# Patient Record
Sex: Female | Born: 1950
Health system: Southern US, Community
[De-identification: ages and names within clinical notes are randomized; demographics above are authoritative.]

## PROBLEM LIST (undated history)

## (undated) DIAGNOSIS — D649 Anemia, unspecified: Secondary | ICD-10-CM

## (undated) DIAGNOSIS — I10 Essential (primary) hypertension: Secondary | ICD-10-CM

## (undated) DIAGNOSIS — N184 Chronic kidney disease, stage 4 (severe): Secondary | ICD-10-CM

## (undated) DIAGNOSIS — F319 Bipolar disorder, unspecified: Secondary | ICD-10-CM

## (undated) DIAGNOSIS — E119 Type 2 diabetes mellitus without complications: Secondary | ICD-10-CM

## (undated) DIAGNOSIS — E785 Hyperlipidemia, unspecified: Secondary | ICD-10-CM

## (undated) DIAGNOSIS — F329 Major depressive disorder, single episode, unspecified: Secondary | ICD-10-CM

## (undated) DIAGNOSIS — R251 Tremor, unspecified: Secondary | ICD-10-CM

## (undated) DIAGNOSIS — F419 Anxiety disorder, unspecified: Secondary | ICD-10-CM

## (undated) DIAGNOSIS — F32A Depression, unspecified: Secondary | ICD-10-CM

## (undated) HISTORY — DX: Essential (primary) hypertension: I10

## (undated) HISTORY — DX: Anxiety disorder, unspecified: F41.9

## (undated) HISTORY — DX: Type 2 diabetes mellitus without complications: E11.9

## (undated) HISTORY — DX: Depression, unspecified: F32.A

## (undated) HISTORY — DX: Major depressive disorder, single episode, unspecified: F32.9

## (undated) HISTORY — DX: Anemia, unspecified: D64.9

## (undated) HISTORY — PX: VAGINAL HYSTERECTOMY: SUR661

## (undated) HISTORY — DX: Hyperlipidemia, unspecified: E78.5

## (undated) HISTORY — DX: Bipolar disorder, unspecified: F31.9

## (undated) HISTORY — DX: Tremor, unspecified: R25.1

## (undated) HISTORY — DX: Chronic kidney disease, stage 4 (severe): N18.4

---

## 1997-12-21 ENCOUNTER — Inpatient Hospital Stay (HOSPITAL_COMMUNITY): Admission: RE | Admit: 1997-12-21 | Discharge: 1997-12-23 | Payer: Self-pay | Admitting: Obstetrics & Gynecology

## 2000-01-29 ENCOUNTER — Inpatient Hospital Stay (HOSPITAL_COMMUNITY): Admission: AD | Admit: 2000-01-29 | Discharge: 2000-02-08 | Payer: Self-pay | Admitting: *Deleted

## 2000-02-08 ENCOUNTER — Encounter: Payer: Self-pay | Admitting: Emergency Medicine

## 2000-02-08 ENCOUNTER — Inpatient Hospital Stay: Admission: EM | Admit: 2000-02-08 | Discharge: 2000-02-18 | Payer: Self-pay | Admitting: Emergency Medicine

## 2000-02-13 ENCOUNTER — Encounter: Payer: Self-pay | Admitting: Pulmonary Disease

## 2000-02-19 ENCOUNTER — Other Ambulatory Visit: Admission: RE | Admit: 2000-02-19 | Discharge: 2000-02-25 | Payer: Self-pay

## 2000-04-17 ENCOUNTER — Other Ambulatory Visit (HOSPITAL_COMMUNITY): Admission: RE | Admit: 2000-04-17 | Discharge: 2000-04-29 | Payer: Self-pay | Admitting: *Deleted

## 2000-06-10 ENCOUNTER — Inpatient Hospital Stay (HOSPITAL_COMMUNITY): Admission: EM | Admit: 2000-06-10 | Discharge: 2000-06-22 | Payer: Self-pay | Admitting: *Deleted

## 2001-04-19 ENCOUNTER — Other Ambulatory Visit: Admission: RE | Admit: 2001-04-19 | Discharge: 2001-04-19 | Payer: Self-pay | Admitting: Obstetrics and Gynecology

## 2001-11-20 ENCOUNTER — Inpatient Hospital Stay (HOSPITAL_COMMUNITY): Admission: EM | Admit: 2001-11-20 | Discharge: 2001-12-02 | Payer: Self-pay | Admitting: Emergency Medicine

## 2001-11-20 ENCOUNTER — Encounter: Payer: Self-pay | Admitting: Emergency Medicine

## 2001-11-21 ENCOUNTER — Encounter: Payer: Self-pay | Admitting: Neurology

## 2001-11-21 ENCOUNTER — Encounter: Payer: Self-pay | Admitting: Anesthesiology

## 2001-11-24 ENCOUNTER — Encounter: Payer: Self-pay | Admitting: Neurology

## 2001-11-29 ENCOUNTER — Encounter: Payer: Self-pay | Admitting: Pulmonary Disease

## 2002-04-28 ENCOUNTER — Other Ambulatory Visit: Admission: RE | Admit: 2002-04-28 | Discharge: 2002-04-28 | Payer: Self-pay | Admitting: Obstetrics and Gynecology

## 2003-05-01 ENCOUNTER — Encounter: Admission: RE | Admit: 2003-05-01 | Discharge: 2003-07-30 | Payer: Self-pay | Admitting: Pulmonary Disease

## 2003-05-09 ENCOUNTER — Other Ambulatory Visit: Admission: RE | Admit: 2003-05-09 | Discharge: 2003-05-09 | Payer: Self-pay | Admitting: Obstetrics and Gynecology

## 2003-08-03 ENCOUNTER — Encounter: Admission: RE | Admit: 2003-08-03 | Discharge: 2003-11-01 | Payer: Self-pay | Admitting: Pulmonary Disease

## 2004-04-05 ENCOUNTER — Other Ambulatory Visit: Admission: RE | Admit: 2004-04-05 | Discharge: 2004-04-05 | Payer: Self-pay | Admitting: Obstetrics and Gynecology

## 2005-05-20 ENCOUNTER — Other Ambulatory Visit: Admission: RE | Admit: 2005-05-20 | Discharge: 2005-05-20 | Payer: Self-pay | Admitting: Obstetrics and Gynecology

## 2006-06-02 ENCOUNTER — Other Ambulatory Visit: Admission: RE | Admit: 2006-06-02 | Discharge: 2006-06-02 | Payer: Self-pay | Admitting: Obstetrics and Gynecology

## 2006-08-16 ENCOUNTER — Ambulatory Visit (HOSPITAL_BASED_OUTPATIENT_CLINIC_OR_DEPARTMENT_OTHER): Admission: RE | Admit: 2006-08-16 | Discharge: 2006-08-16 | Payer: Self-pay | Admitting: Pulmonary Disease

## 2006-08-23 ENCOUNTER — Ambulatory Visit: Payer: Self-pay | Admitting: Internal Medicine

## 2009-01-30 ENCOUNTER — Ambulatory Visit (HOSPITAL_COMMUNITY): Admission: RE | Admit: 2009-01-30 | Discharge: 2009-01-30 | Payer: Self-pay | Admitting: Pulmonary Disease

## 2009-01-30 IMAGING — CR DG SINUSES COMPLETE 3+V
4 series · 4 of 4 positions shown · non-contrast
Comparison: None available.

CLINICAL DATA: 57-year-old female with sinus headache and
congestion on the left side.

PARANASAL SINUSES - 1-2 VIEW

[[person_name]]
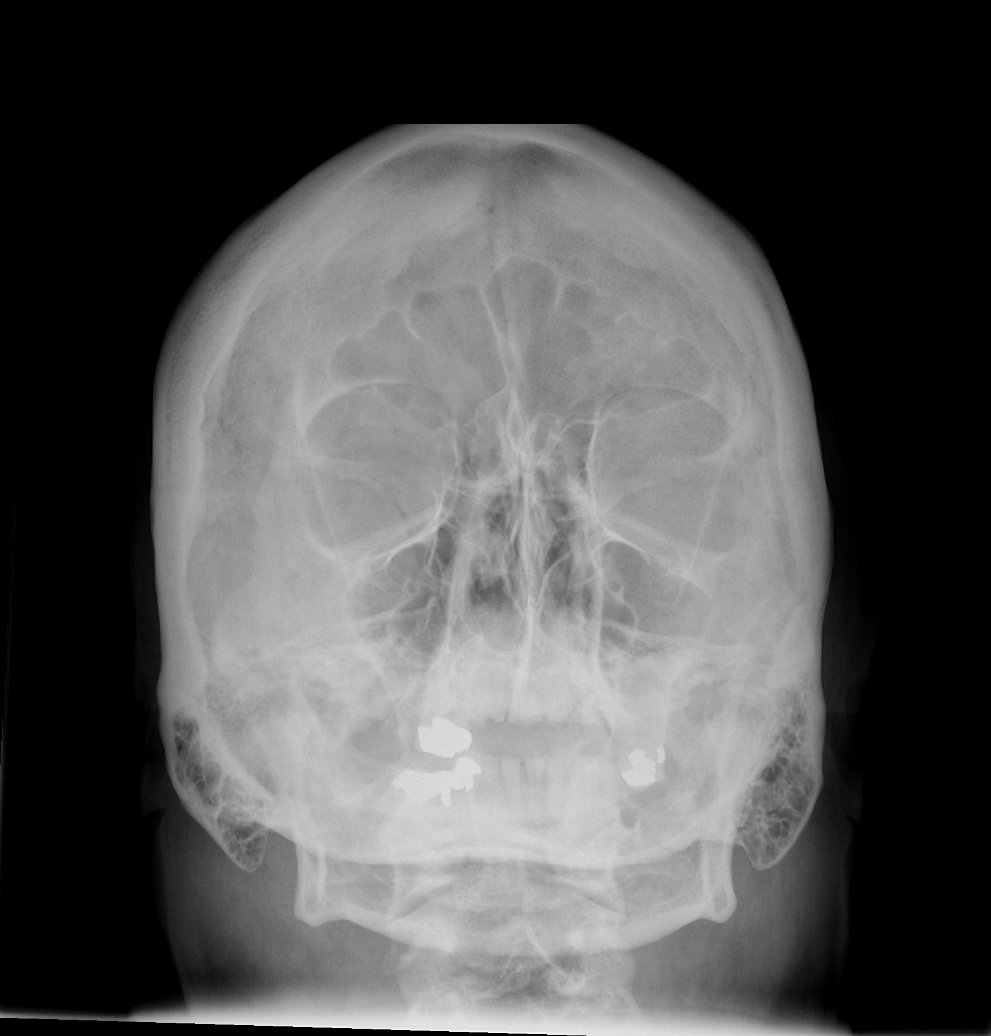

[w waters]
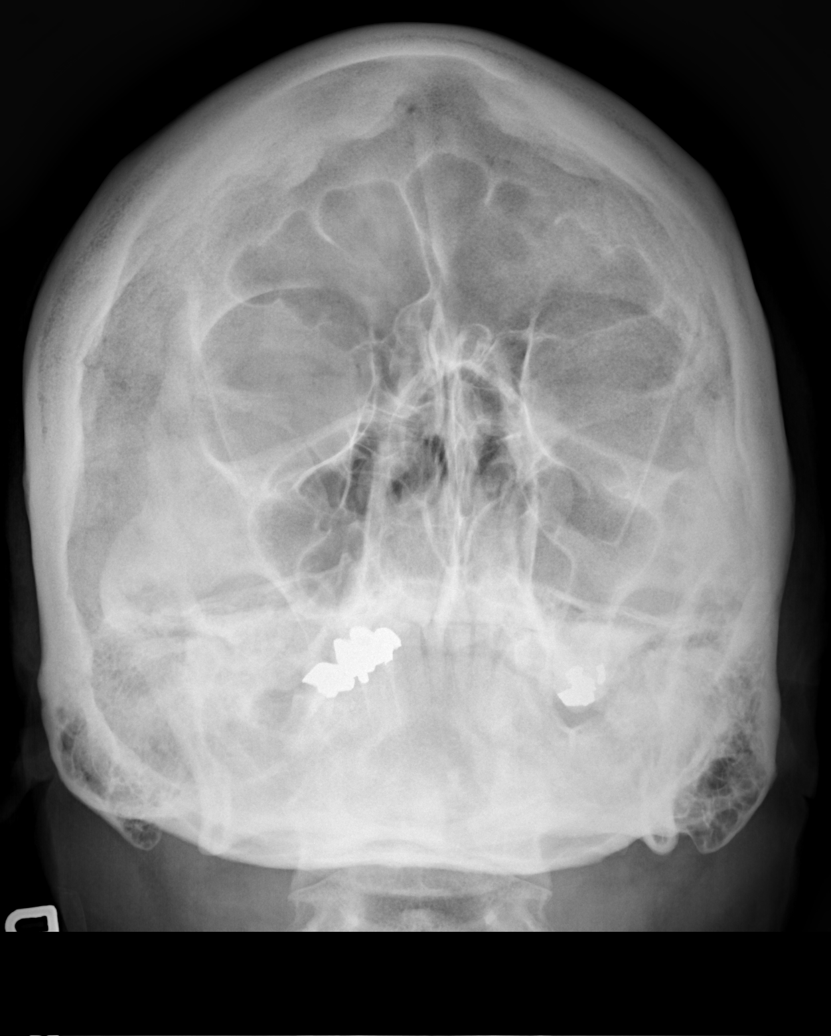

[w skull lat]
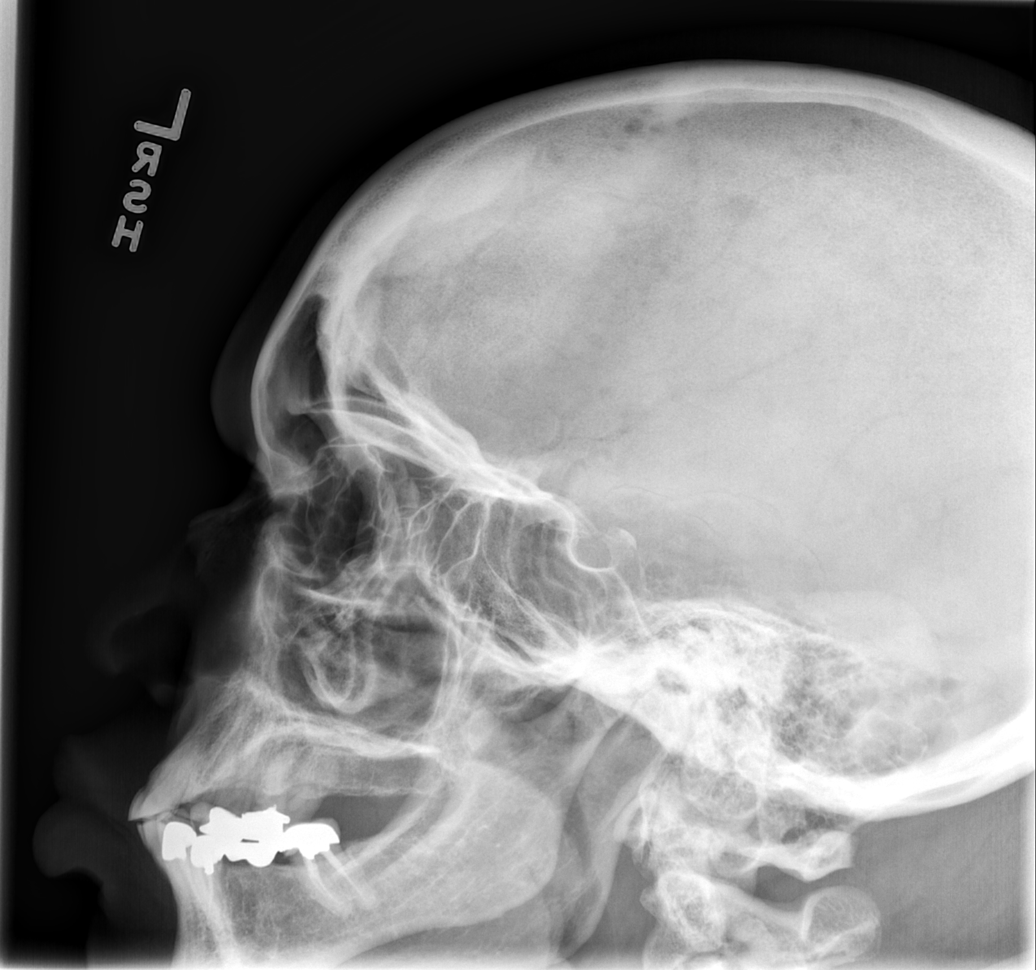

[w smv *]
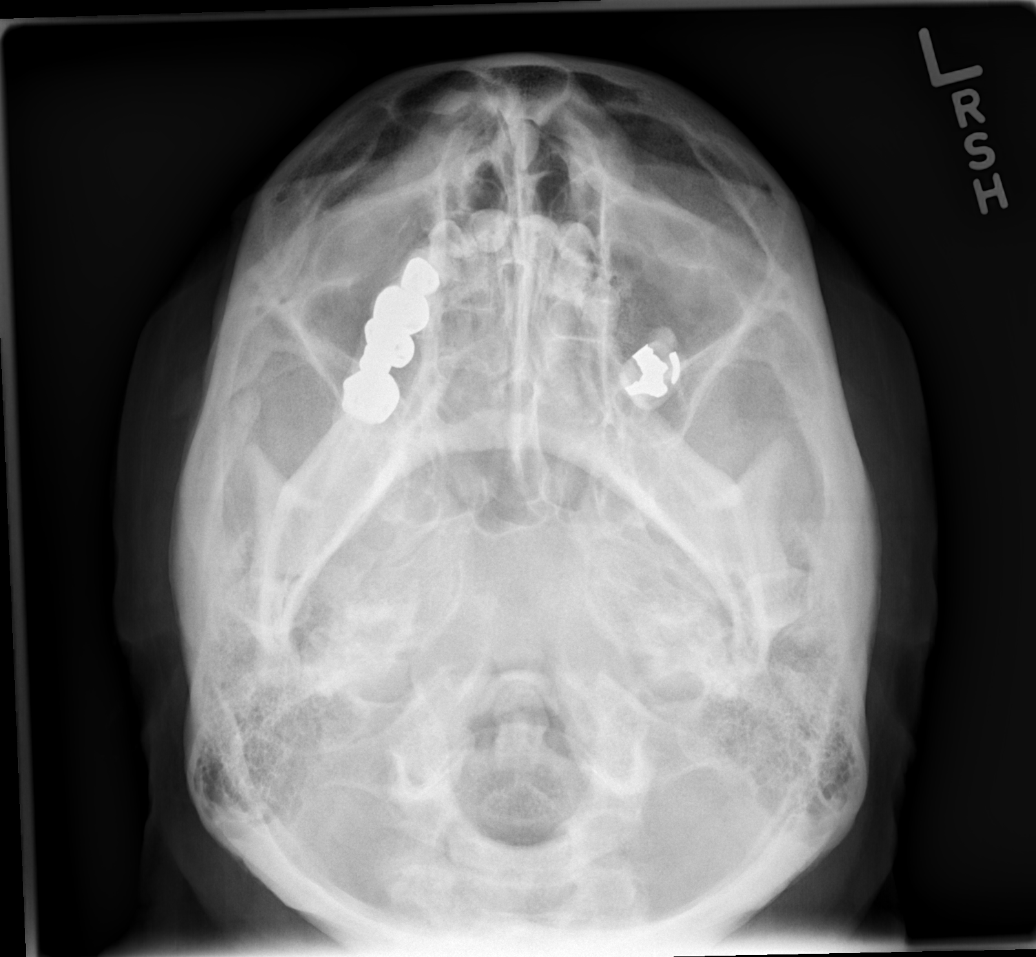

[4 of 4 positions shown; findings below may reference images not displayed]

FINDINGS: Bone mineralization is within normal limits.  Incidental
hyperostosis frontalis.  Wound with paranasal sinuses appear
normally pneumatized throughout.  Mastoid air cells also appear
normally pneumatized.  No suspicious osseous lesion identified.
IMPRESSION: No evidence of paranasal sinus disease.

## 2009-01-30 IMAGING — CR DG LUMBAR SPINE COMPLETE 4+V
5 series · 5 of 5 positions shown · non-contrast
Comparison: None.

CLINICAL DATA: Chronic low back pain.

LUMBAR SPINE - COMPLETE 4+ VIEW

[t l-spine a.p.]
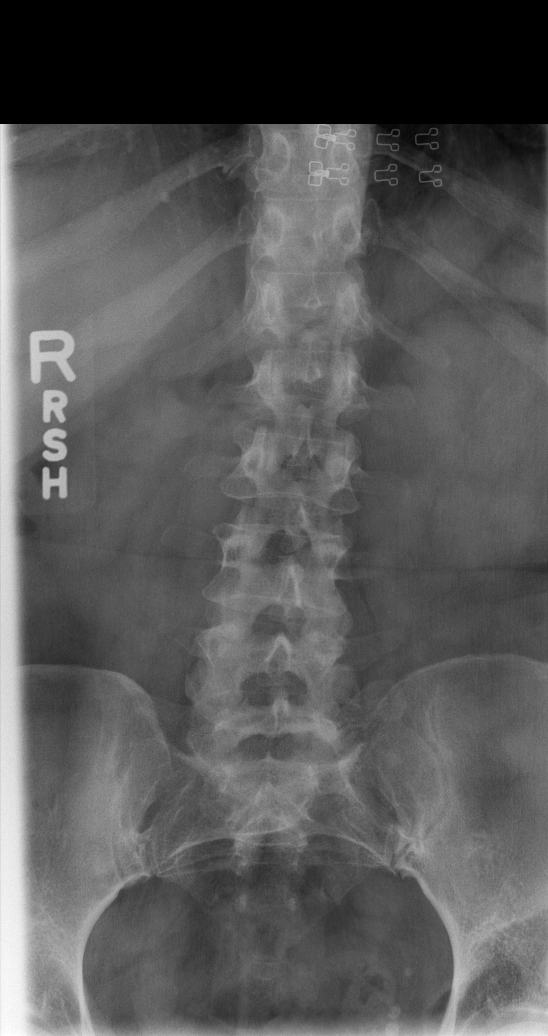

[t l-spine oblique exposure (1 of 2)]
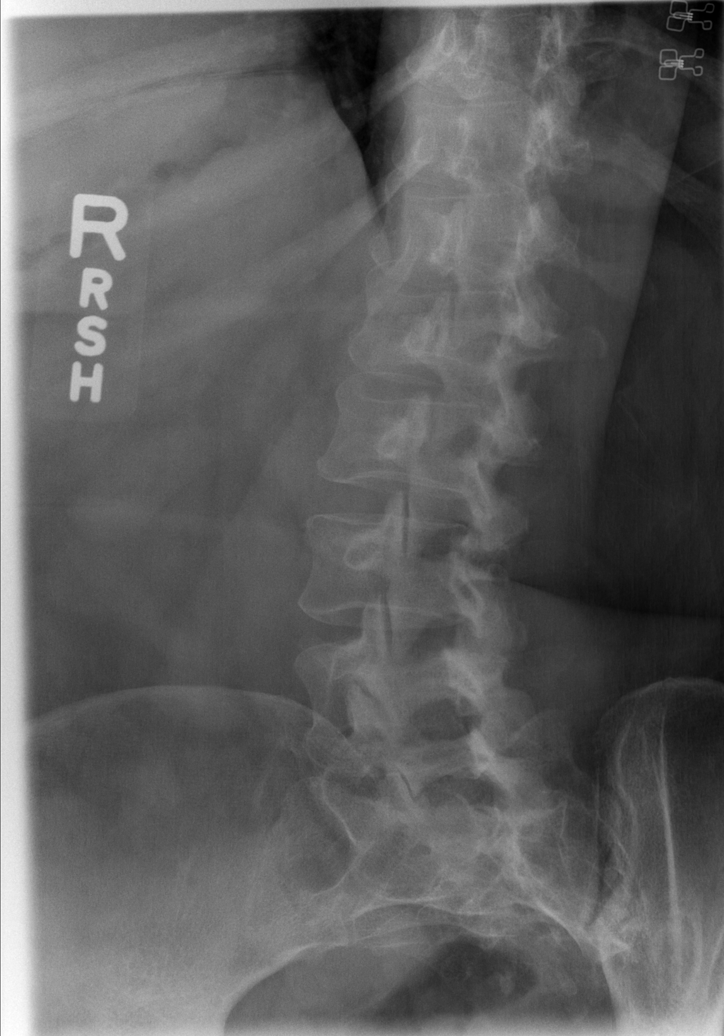

[t l-spine oblique exposure (2 of 2)]
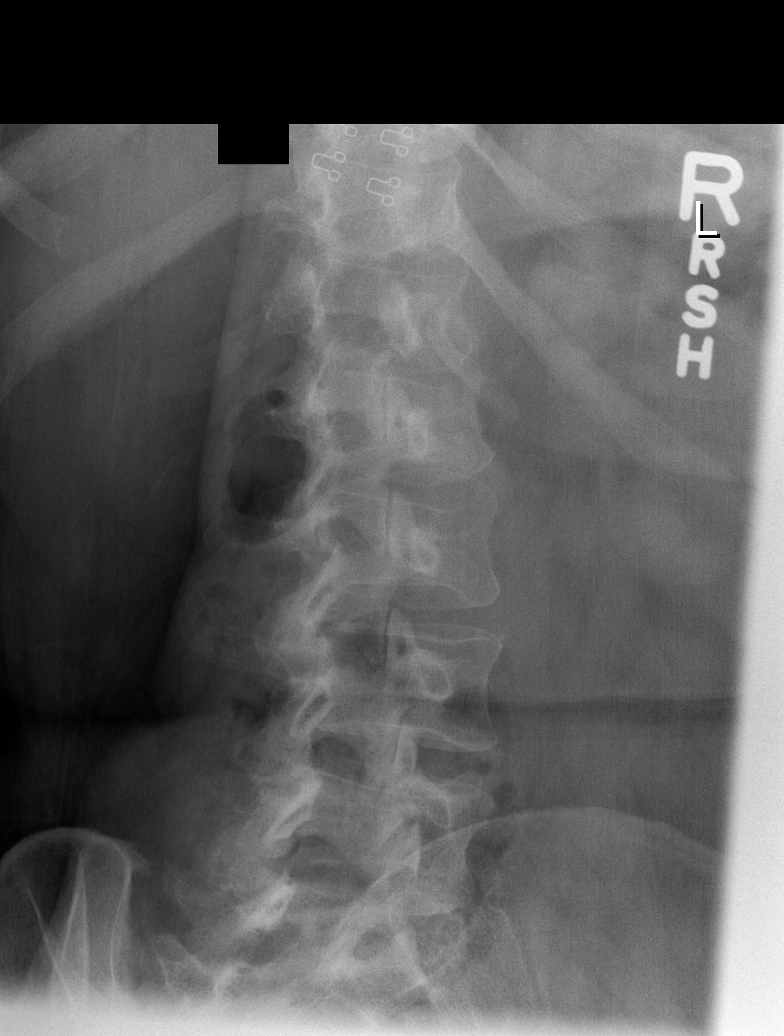

[t l-spine lat]
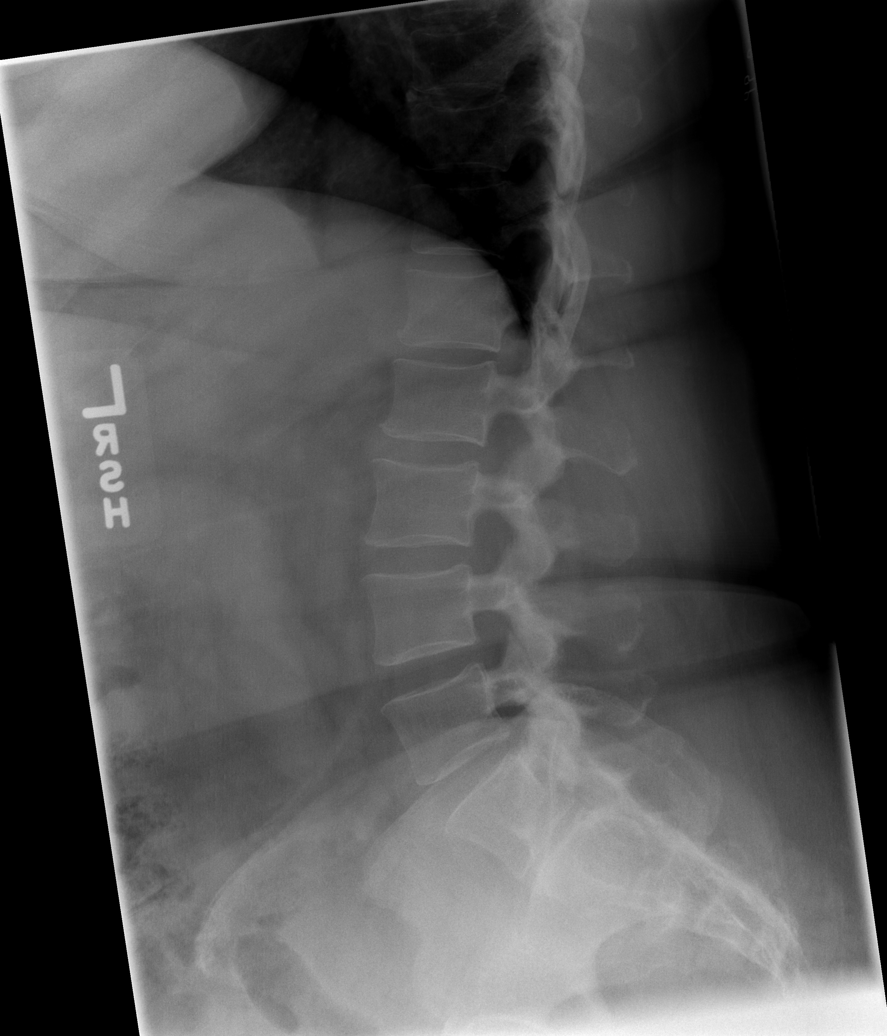

[t l-spine l5-s1 spot]
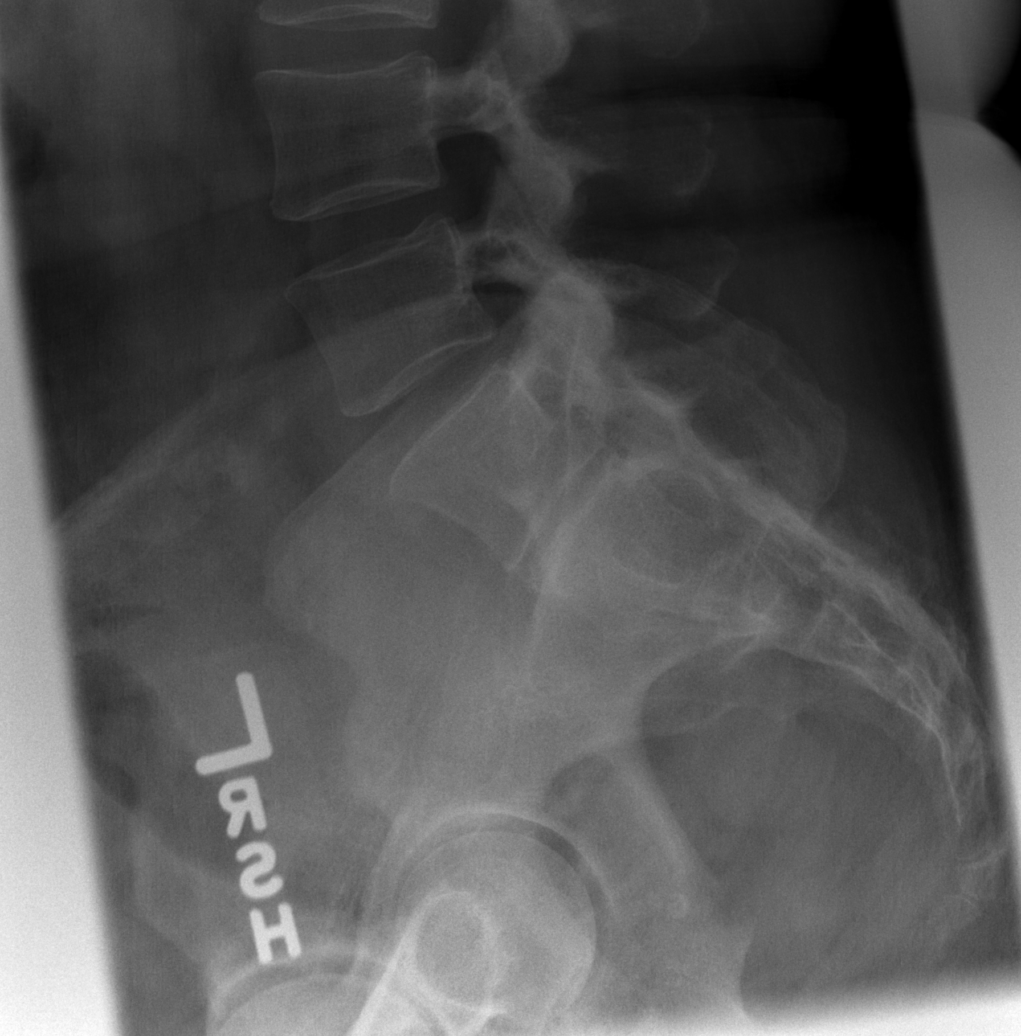

[5 of 5 positions shown; findings below may reference images not displayed]

FINDINGS: There is mild lumbar scoliosis.  L5 is a transitional
segment and partially incorporated into the sacrum.  Negative for
fracture or pars defect.  No significant degenerative change.  The
disc spaces are maintained and  the facet joints are normal.
IMPRESSION: No acute bony abnormality.  Mild scoliosis.

## 2010-08-23 ENCOUNTER — Inpatient Hospital Stay (HOSPITAL_COMMUNITY)
Admission: EM | Admit: 2010-08-23 | Discharge: 2010-08-28 | DRG: 683 | Disposition: A | Discharge status: Other Acute Inpatient Hospital | Payer: MEDICARE | Attending: Internal Medicine | Admitting: Internal Medicine

## 2010-08-23 ENCOUNTER — Emergency Department (HOSPITAL_COMMUNITY): Payer: MEDICARE

## 2010-08-23 DIAGNOSIS — R7401 Elevation of levels of liver transaminase levels: Secondary | ICD-10-CM | POA: Diagnosis present

## 2010-08-23 DIAGNOSIS — Z9119 Patient's noncompliance with other medical treatment and regimen: Secondary | ICD-10-CM

## 2010-08-23 DIAGNOSIS — N179 Acute kidney failure, unspecified: Principal | ICD-10-CM | POA: Diagnosis present

## 2010-08-23 DIAGNOSIS — E785 Hyperlipidemia, unspecified: Secondary | ICD-10-CM | POA: Diagnosis present

## 2010-08-23 DIAGNOSIS — N189 Chronic kidney disease, unspecified: Secondary | ICD-10-CM | POA: Diagnosis present

## 2010-08-23 DIAGNOSIS — F309 Manic episode, unspecified: Secondary | ICD-10-CM | POA: Diagnosis present

## 2010-08-23 DIAGNOSIS — E669 Obesity, unspecified: Secondary | ICD-10-CM | POA: Diagnosis present

## 2010-08-23 DIAGNOSIS — I129 Hypertensive chronic kidney disease with stage 1 through stage 4 chronic kidney disease, or unspecified chronic kidney disease: Secondary | ICD-10-CM | POA: Diagnosis present

## 2010-08-23 DIAGNOSIS — R7402 Elevation of levels of lactic acid dehydrogenase (LDH): Secondary | ICD-10-CM | POA: Diagnosis present

## 2010-08-23 DIAGNOSIS — Z91199 Patient's noncompliance with other medical treatment and regimen due to unspecified reason: Secondary | ICD-10-CM

## 2010-08-23 DIAGNOSIS — J029 Acute pharyngitis, unspecified: Secondary | ICD-10-CM | POA: Diagnosis present

## 2010-08-23 LAB — URINALYSIS, ROUTINE W REFLEX MICROSCOPIC
Bilirubin Urine: NEGATIVE
Specific Gravity, Urine: 1.017 (ref 1.005–1.030)
pH: 5 (ref 5.0–8.0)

## 2010-08-23 LAB — URINE MICROSCOPIC-ADD ON

## 2010-08-23 LAB — RAPID URINE DRUG SCREEN, HOSP PERFORMED
Benzodiazepines: NOT DETECTED
Cocaine: NOT DETECTED
Tetrahydrocannabinol: NOT DETECTED

## 2010-08-23 LAB — GLUCOSE, CAPILLARY: Glucose-Capillary: 65 mg/dL — ABNORMAL LOW (ref 70–99)

## 2010-08-23 LAB — CBC
HCT: 31.8 % — ABNORMAL LOW (ref 36.0–46.0)
MCV: 87.1 fL (ref 78.0–100.0)
RDW: 12.8 % (ref 11.5–15.5)
WBC: 9.6 10*3/uL (ref 4.0–10.5)

## 2010-08-23 LAB — COMPREHENSIVE METABOLIC PANEL
ALT: 104 U/L — ABNORMAL HIGH (ref 0–35)
AST: 266 U/L — ABNORMAL HIGH (ref 0–37)
Calcium: 9.7 mg/dL (ref 8.4–10.5)
GFR calc Af Amer: 8 mL/min — ABNORMAL LOW (ref >60)
Sodium: 141 mEq/L (ref 135–145)
Total Protein: 7.8 g/dL (ref 6.0–8.3)

## 2010-08-23 LAB — DIFFERENTIAL
Eosinophils Relative: 2 % (ref 0–5)
Lymphocytes Relative: 20 % (ref 12–46)
Lymphs Abs: 1.9 10*3/uL (ref 0.7–4.0)

## 2010-08-23 IMAGING — US US RENAL
1 series · 14 of 25 positions shown · non-contrast
Comparison: None

CLINICAL DATA: Acute renal failure.  Evaluate for renal
obstruction.

RENAL/URINARY TRACT ULTRASOUND COMPLETE

[Series 1: us renal · 0.28mm/px · 14 of 36 slices shown]
[im 1/36]
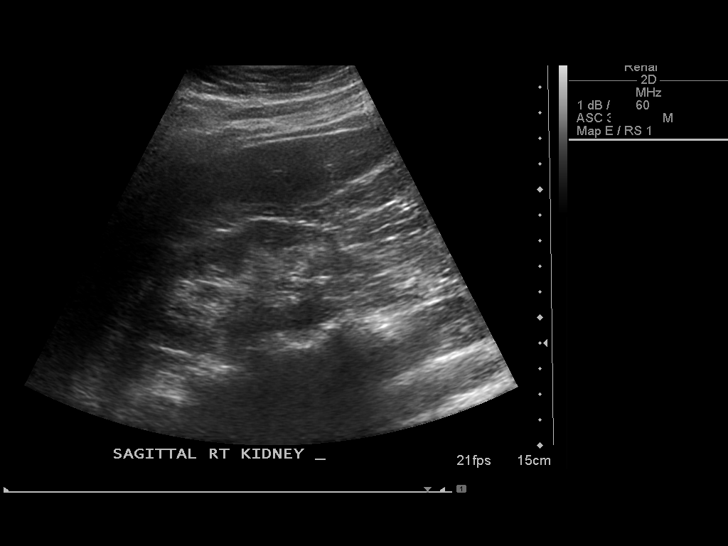
[im 3/36]
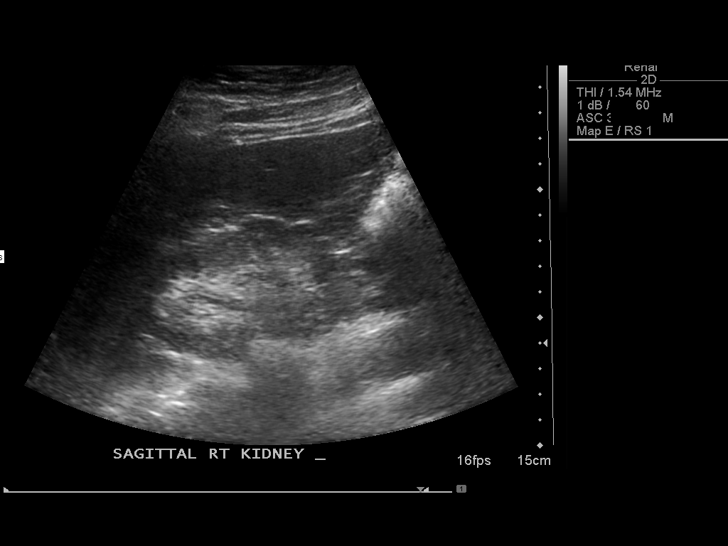
[im 6/36]
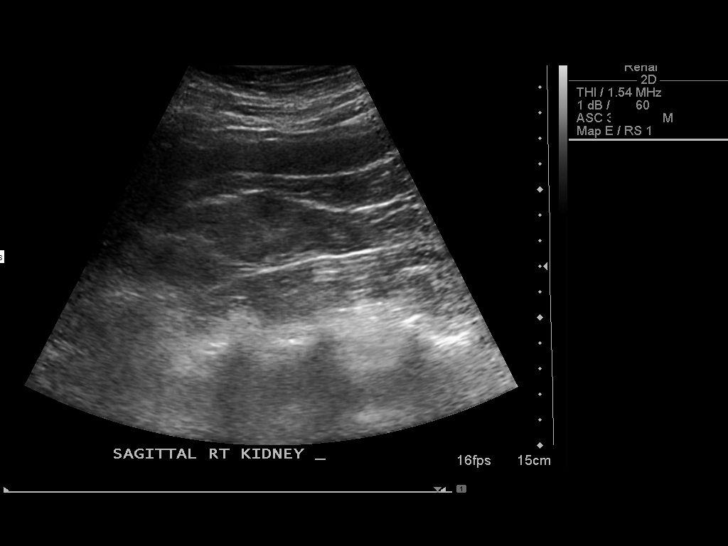
[im 9/36]
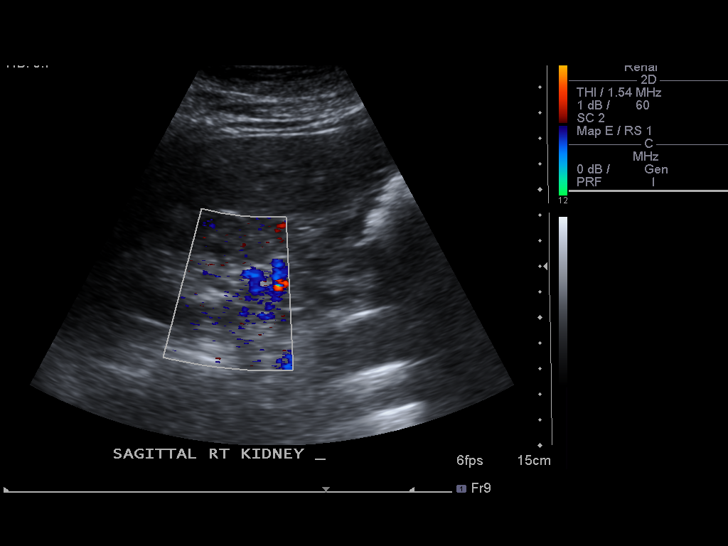
[im 12/36]
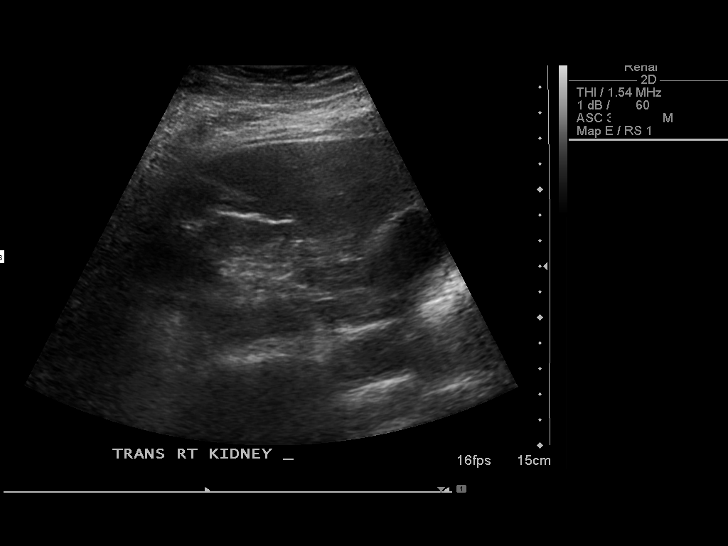
[im 14/36]
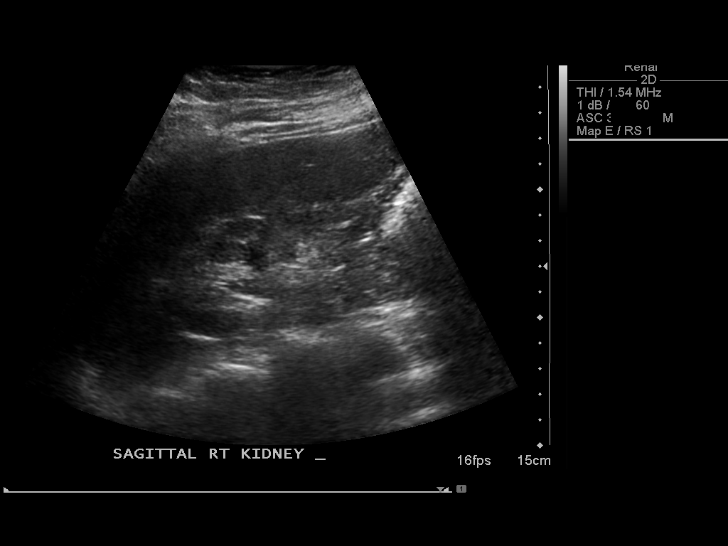
[im 17/36]
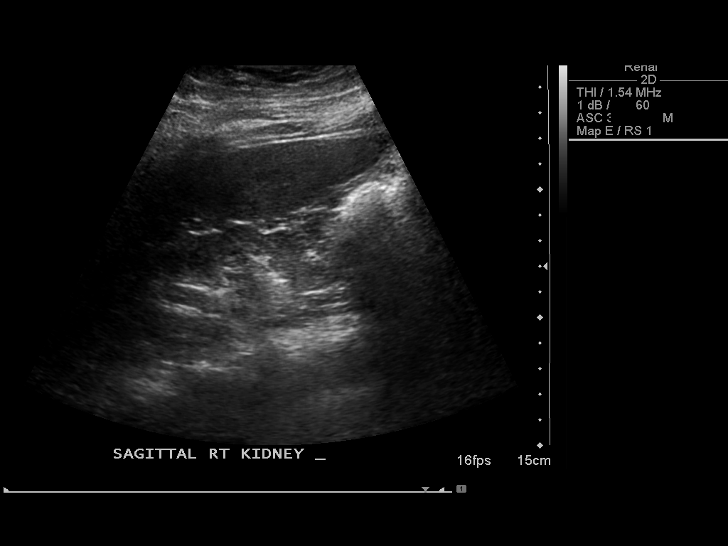
[im 19/36]
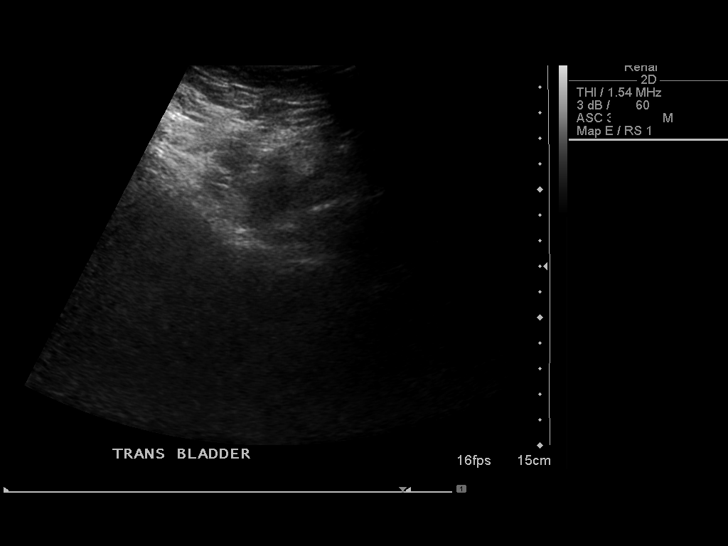
[im 22/36]
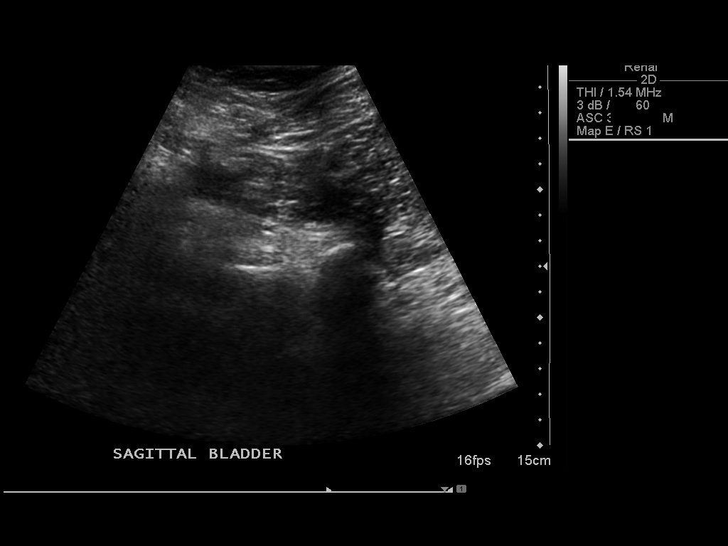
[im 24/36]
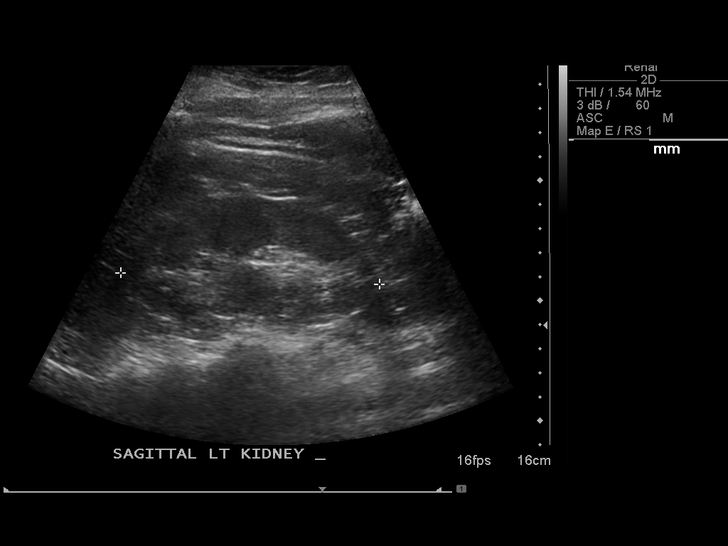
[im 27/36]
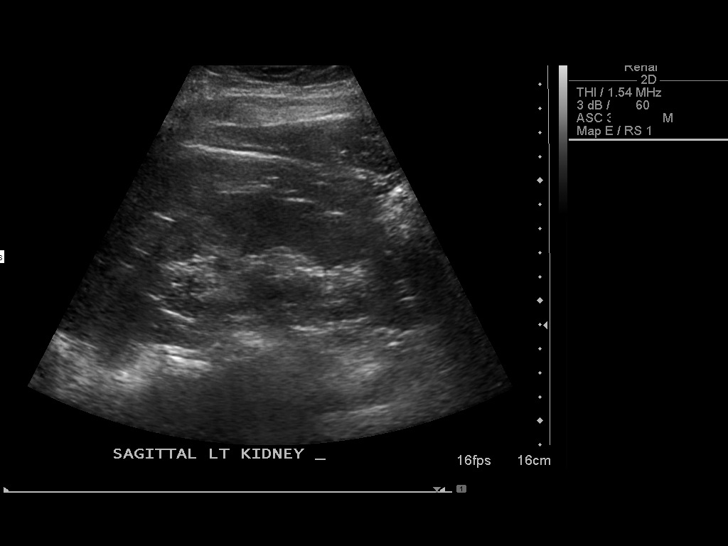
[im 30/36]
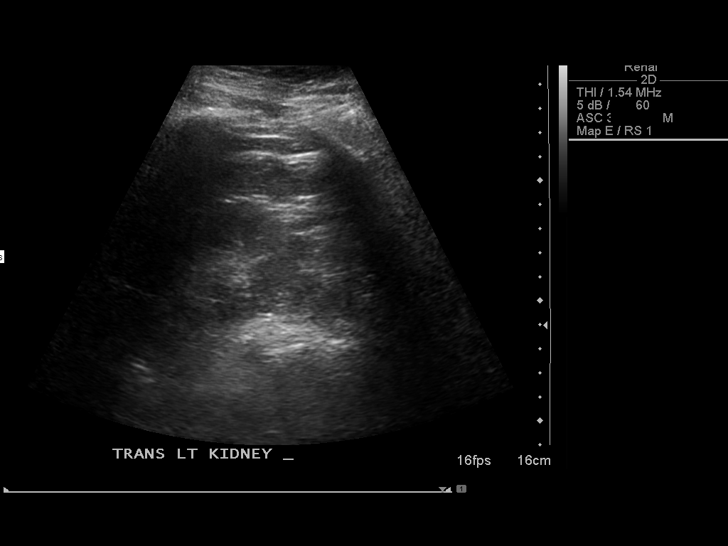
[im 33/36]
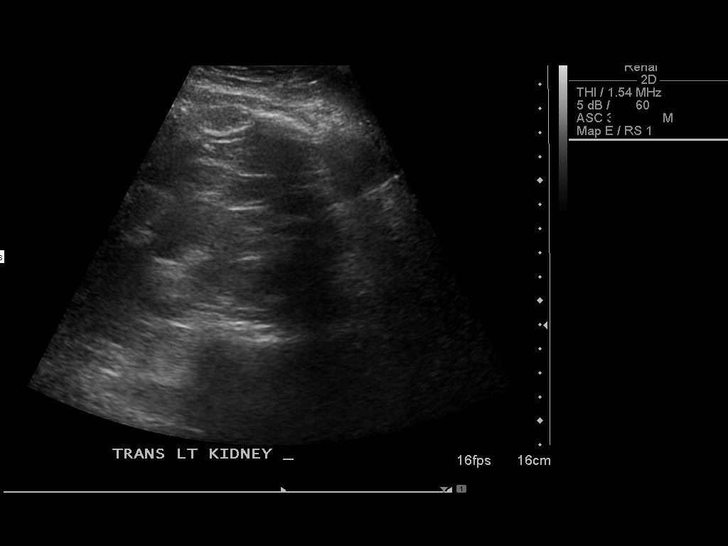
[im 36/36]
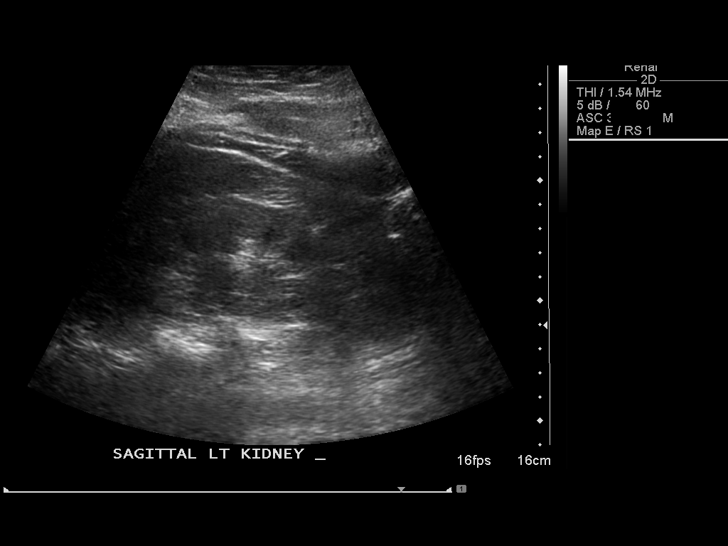

[14 of 25 positions shown; findings below may reference images not displayed]

FINDINGS: Right Kidney:  The right kidney appears small and echogenic
measuring 8.4 cm.  Right-sided  pelvocaliectasis is noted.  No mass
identified.

 Left Kidney:  The left kidney measures 10.8 cm.  There is normal
renal cortical echogenicity.  No hydronephrosis identified.  No
renal calculi noted.  No mass.

Bladder:  Appears normal for degree of bladder distention.
IMPRESSION: 1.  Right renal atrophy and pelvocaliectasis.
2.  Normal appearance of the left kidney.

## 2010-08-24 DIAGNOSIS — F312 Bipolar disorder, current episode manic severe with psychotic features: Secondary | ICD-10-CM

## 2010-08-24 LAB — BASIC METABOLIC PANEL
BUN: 85 mg/dL — ABNORMAL HIGH (ref 6–23)
CO2: 18 mEq/L — ABNORMAL LOW (ref 19–32)
Calcium: 8.9 mg/dL (ref 8.4–10.5)
Chloride: 107 mEq/L (ref 96–112)
Creatinine, Ser: 5.04 mg/dL — ABNORMAL HIGH (ref 0.4–1.2)
GFR calc Af Amer: 11 mL/min — ABNORMAL LOW (ref >60)
GFR calc non Af Amer: 9 mL/min — ABNORMAL LOW (ref >60)
Glucose, Bld: 83 mg/dL (ref 70–99)
Glucose, Bld: 93 mg/dL (ref 70–99)
Potassium: 5.5 mEq/L — ABNORMAL HIGH (ref 3.5–5.1)
Sodium: 140 mEq/L (ref 135–145)
Sodium: 136 mEq/L (ref 135–145)

## 2010-08-24 LAB — CBC
MCHC: 32.4 g/dL (ref 30.0–36.0)
MCV: 87.6 fL (ref 78.0–100.0)
Platelets: ADEQUATE 10*3/uL (ref 150–400)
RDW: 12.8 % (ref 11.5–15.5)
WBC: 6.1 10*3/uL (ref 4.0–10.5)

## 2010-08-24 LAB — LIPID PANEL
Cholesterol: 152 mg/dL (ref 0–200)
HDL: 56 mg/dL (ref >39)
Total CHOL/HDL Ratio: 2.7 RATIO
Triglycerides: 60 mg/dL (ref <150)

## 2010-08-24 LAB — GLUCOSE, CAPILLARY: Glucose-Capillary: 88 mg/dL (ref 70–99)

## 2010-08-25 DIAGNOSIS — F312 Bipolar disorder, current episode manic severe with psychotic features: Secondary | ICD-10-CM

## 2010-08-25 LAB — COMPREHENSIVE METABOLIC PANEL
ALT: 72 U/L — ABNORMAL HIGH (ref 0–35)
Alkaline Phosphatase: 39 U/L (ref 39–117)
BUN: 63 mg/dL — ABNORMAL HIGH (ref 6–23)
Chloride: 114 mEq/L — ABNORMAL HIGH (ref 96–112)
Glucose, Bld: 93 mg/dL (ref 70–99)
Potassium: 4.8 mEq/L (ref 3.5–5.1)
Sodium: 141 mEq/L (ref 135–145)
Total Bilirubin: 0.7 mg/dL (ref 0.3–1.2)

## 2010-08-25 LAB — PROTEIN / CREATININE RATIO, URINE
Protein Creatinine Ratio: 0.44 — ABNORMAL HIGH (ref 0.00–0.15)
Total Protein, Urine: 14 mg/dL

## 2010-08-25 LAB — CBC
HCT: 29.3 % — ABNORMAL LOW (ref 36.0–46.0)
Hemoglobin: 9.4 g/dL — ABNORMAL LOW (ref 12.0–15.0)
MCV: 87.2 fL (ref 78.0–100.0)
RBC: 3.36 MIL/uL — ABNORMAL LOW (ref 3.87–5.11)
WBC: 8.2 10*3/uL (ref 4.0–10.5)

## 2010-08-25 LAB — GLUCOSE, CAPILLARY
Glucose-Capillary: 99 mg/dL (ref 70–99)
Glucose-Capillary: 94 mg/dL (ref 70–99)
Glucose-Capillary: 99 mg/dL (ref 70–99)
Glucose-Capillary: 87 mg/dL (ref 70–99)

## 2010-08-25 NOTE — Consult Note (Signed)
  NAME:  Kimberly Gross, Kimberly Gross               ACCOUNT NO.:  [id number]  MEDICAL RECORD NO.:  [id number]           PATIENT TYPE:  I  LOCATION:  [id number]                         FACILITY:  [facility]  PHYSICIAN:  Marlou Sa, MD DATE OF BIRTH:  1950/08/23  DATE OF CONSULTATION:  08/24/2010 DATE OF DISCHARGE:                                CONSULTATION   REASON FOR CONSULTATION:  Acute manic episode.  HISTORY OF PRESENT ILLNESS:  A 60 year old black female with a history of bipolar disorder/schizophrenia who was admitted.  When I saw the patient, the patient is very disorganized, grandiose, talking about a card.  Wants to be with her mother who has died 3 years ago. Noncompliant with her medication for 2 weeks.  As per family, daughter and the husband, the patient is not speaking for the last few weeks, cleaning her mother's apartment all day and night.  The patient is seeing Dr. Abner Greenspan the psychiatrist in the outpatient setting and is on Geodon for the last 11 years.  The patient's daughter told me that the patient's brother is sick so that is why she became upset and went into a psychotic episode.  On asking the patient whether she wants to take the medication she said yes I want to take the medications.  The patient was admitted to the medical floor because of the acute renal failure. The patient was medicated in the ER twice with Geodon 10 mg IM and Ativan 2 mg.  PAST MEDICAL HISTORY:  History of hypertension, hyperlipidemia and diabetes mellitus.  SOCIAL HISTORY:  The patient lives with her husband.  SUBSTANCE ABUSE HISTORY:  No history of alcohol or drug abuse.  ALLERGIES:  Allergies to THORAZINE.  PSYCHIATRIC MEDICATIONS:  The patient is on Geodon 60 mg twice a day as per the husband and Zoloft 100 mg p.o. daily.  MENTAL STATUS EXAM:  The patient is fairly cooperative during the interview, very disorganized, internally preoccupied.  Grandiose, pressured speech.  Elated mood.   Flight of ideas positive.  Internally preoccupied.  Alert, awake, oriented to place and person but not to time.  Memory immediate, recent and remote poor.  Attention and concentration poor.  Abstraction ability poor.  Insight and judgment poor.  DIAGNOSES:  AXIS I:  As per history bipolar disorder, rule out schizoaffective disorder. AXIS II:  Deferred. AXIS III:  See medical notes. AXIS IV:  Chronic mental issues. AXIS V:  30.  RECOMMENDATIONS: 1. I started the patient back on Geodon 80 mg twice a day.  I also     wrote Ativan 2 mg q.6 hours IM/IV/p.o. if needed for agitation. 2. I also ordered the EKG. 3. Once medically stable, the patient needs to be transferred to     [facility] for further stabilization.  The family agrees with the treatment plan.     Marlou Sa, MD     SA/MEDQ  D:  08/24/2010  T:  08/24/2010  Job:  [id number]  Electronically Signed by Marlou Sa  on 08/25/2010 02:00:42 PM

## 2010-08-26 DIAGNOSIS — F312 Bipolar disorder, current episode manic severe with psychotic features: Secondary | ICD-10-CM

## 2010-08-26 LAB — RENAL FUNCTION PANEL
Albumin: 3.1 g/dL — ABNORMAL LOW (ref 3.5–5.2)
BUN: 42 mg/dL — ABNORMAL HIGH (ref 6–23)
Calcium: 8.6 mg/dL (ref 8.4–10.5)
Phosphorus: 2.5 mg/dL (ref 2.3–4.6)
Potassium: 4.5 mEq/L (ref 3.5–5.1)

## 2010-08-26 LAB — HEPATIC FUNCTION PANEL
ALT: 55 U/L — ABNORMAL HIGH (ref 0–35)
Bilirubin, Direct: 0.1 mg/dL (ref 0.0–0.3)
Indirect Bilirubin: 0.2 mg/dL — ABNORMAL LOW (ref 0.3–0.9)

## 2010-08-26 LAB — GLUCOSE, CAPILLARY: Glucose-Capillary: 107 mg/dL — ABNORMAL HIGH (ref 70–99)

## 2010-08-27 DIAGNOSIS — F312 Bipolar disorder, current episode manic severe with psychotic features: Secondary | ICD-10-CM

## 2010-08-27 LAB — IGG, IGA, IGM
IgA: 72 mg/dL (ref 68–378)
IgM, Serum: 85 mg/dL (ref 60–263)

## 2010-08-27 LAB — GLUCOSE, CAPILLARY
Glucose-Capillary: 114 mg/dL — ABNORMAL HIGH (ref 70–99)
Glucose-Capillary: 112 mg/dL — ABNORMAL HIGH (ref 70–99)

## 2010-08-27 LAB — UIFE/LIGHT CHAINS/TP QN, 24-HR UR
Albumin, U: DETECTED
Alpha 1, Urine: DETECTED — AB
Total Protein, Urine: 18 mg/dL

## 2010-08-27 LAB — PROTEIN ELECTROPH W RFLX QUANT IMMUNOGLOBULINS
Albumin ELP: 56.1 % (ref 55.8–66.1)
Alpha-2-Globulin: 11.2 % (ref 7.1–11.8)
Beta Globulin: 6.4 % (ref 4.7–7.2)
Total Protein ELP: 6.4 g/dL (ref 6.0–8.3)

## 2010-08-27 LAB — IMMUNOFIXATION ADD-ON

## 2010-08-27 LAB — BASIC METABOLIC PANEL
BUN: 30 mg/dL — ABNORMAL HIGH (ref 6–23)
CO2: 21 mEq/L (ref 19–32)
Calcium: 9 mg/dL (ref 8.4–10.5)
GFR calc non Af Amer: 26 mL/min — ABNORMAL LOW (ref >60)
Glucose, Bld: 103 mg/dL — ABNORMAL HIGH (ref 70–99)

## 2010-08-28 ENCOUNTER — Inpatient Hospital Stay (HOSPITAL_COMMUNITY)
Admission: AD | Admit: 2010-08-28 | Discharge: 2010-09-23 | DRG: 885 | Disposition: A | Discharge status: Home / Self Care | Payer: MEDICARE | Source: Ambulatory Visit | Attending: Psychiatry | Admitting: Psychiatry

## 2010-08-28 DIAGNOSIS — Z5320 Procedure and treatment not carried out because of patient's decision for unspecified reasons: Secondary | ICD-10-CM

## 2010-08-28 DIAGNOSIS — E785 Hyperlipidemia, unspecified: Secondary | ICD-10-CM

## 2010-08-28 DIAGNOSIS — N189 Chronic kidney disease, unspecified: Secondary | ICD-10-CM

## 2010-08-28 DIAGNOSIS — E669 Obesity, unspecified: Secondary | ICD-10-CM

## 2010-08-28 DIAGNOSIS — R079 Chest pain, unspecified: Secondary | ICD-10-CM

## 2010-08-28 DIAGNOSIS — F312 Bipolar disorder, current episode manic severe with psychotic features: Secondary | ICD-10-CM

## 2010-08-28 DIAGNOSIS — F259 Schizoaffective disorder, unspecified: Principal | ICD-10-CM

## 2010-08-28 DIAGNOSIS — N179 Acute kidney failure, unspecified: Secondary | ICD-10-CM

## 2010-08-28 DIAGNOSIS — F3110 Bipolar disorder, current episode manic without psychotic features, unspecified: Secondary | ICD-10-CM

## 2010-08-28 DIAGNOSIS — R7402 Elevation of levels of lactic acid dehydrogenase (LDH): Secondary | ICD-10-CM

## 2010-08-28 DIAGNOSIS — G40909 Epilepsy, unspecified, not intractable, without status epilepticus: Secondary | ICD-10-CM

## 2010-08-28 DIAGNOSIS — R52 Pain, unspecified: Secondary | ICD-10-CM

## 2010-08-28 DIAGNOSIS — R7401 Elevation of levels of liver transaminase levels: Secondary | ICD-10-CM

## 2010-08-28 DIAGNOSIS — E119 Type 2 diabetes mellitus without complications: Secondary | ICD-10-CM

## 2010-08-28 DIAGNOSIS — I129 Hypertensive chronic kidney disease with stage 1 through stage 4 chronic kidney disease, or unspecified chronic kidney disease: Secondary | ICD-10-CM

## 2010-08-28 LAB — CARDIAC PANEL(CRET KIN+CKTOT+MB+TROPI)
CK, MB: 4.6 ng/mL — ABNORMAL HIGH (ref 0.3–4.0)
Relative Index: 1.2 (ref 0.0–2.5)

## 2010-08-28 LAB — COMPREHENSIVE METABOLIC PANEL
ALT: 45 U/L — ABNORMAL HIGH (ref 0–35)
AST: 34 U/L (ref 0–37)
CO2: 19 mEq/L (ref 19–32)
Calcium: 9.1 mg/dL (ref 8.4–10.5)
Chloride: 118 mEq/L — ABNORMAL HIGH (ref 96–112)
GFR calc Af Amer: 31 mL/min — ABNORMAL LOW (ref >60)
GFR calc non Af Amer: 26 mL/min — ABNORMAL LOW (ref >60)
Glucose, Bld: 100 mg/dL — ABNORMAL HIGH (ref 70–99)
Sodium: 140 mEq/L (ref 135–145)
Total Bilirubin: 0.2 mg/dL — ABNORMAL LOW (ref 0.3–1.2)

## 2010-08-28 LAB — GLUCOSE, CAPILLARY
Glucose-Capillary: 93 mg/dL (ref 70–99)
Glucose-Capillary: 87 mg/dL (ref 70–99)

## 2010-08-28 LAB — MAGNESIUM: Magnesium: 1.6 mg/dL (ref 1.5–2.5)

## 2010-08-28 NOTE — H&P (Signed)
NAME:  Kimberly Gross, Kimberly Gross               ACCOUNT NO.:  [id number]  MEDICAL RECORD NO.:  [id number]           PATIENT TYPE:  E  LOCATION:  WLED                         FACILITY:  [facility]  PHYSICIAN:  Estill Cotta, MD       DATE OF BIRTH:  [birth year]  DATE OF ADMISSION:  08/23/2010 DATE OF DISCHARGE:                             HISTORY & PHYSICAL   PRIMARY CARE PHYSICIAN:  Ernestene Kiel, MD  PSYCHIATRIST:  Mayme Genta, MD  CHIEF COMPLAINT:  Acute manic episode with altered hallucinations.  HISTORY OF PRESENT ILLNESS:  Kimberly Gross is a 60 year old female with a history of schizophrenia with manic depression, bipolar disorder, hypertension, diabetes who was brought to the [facility] emergency room with acute manic episode.  History was obtained from the patient's daughter present in the room.  The patient during the whole encounter continued to praise "Jesus."  The patient does state that she has not slept for the last 2 weeks as God has been talking to her and she has been eating poorly in the last 2 weeks and has not taken any of her psych medications.  The patient states that her mother had died 3 years ago and she has been having suicidal ideation since morning.  Per the daughter, she has been playing the video of her dead mother for the last 2 weeks and wants to die and be with her.  On asking the patient about the plan that she states that she would start taking her medications because her medications are poisoning her system.  The patient stated that she did see her psychiatrist 2 days ago, who recommended her to start taking her medications.  Otherwise she had some nausea and some hot flashes with some diarrhea.  The patient, however, is an acute psychotic episode and has not able to give me much history about the review of systems.  Per the daughter she has been cleaning the house and packing the stuff and washing her curtains nonstop.  In the emergency room, the  patient was noticed to have acute renal failure with BUN of 100 and creatinine of 6.7.  The last creatinine function was 1.4 in 2003 and hospitalist service was consulted for admission.  The patient states that she has not been taking any medications except the pain medications in the last 2 weeks because she has chronic low back pain.  PAST MEDICAL HISTORY: 1. Hypertension. 2. History of schizophrenia. 3. Bipolar disorder with manic depression. 4. Hyperlipidemia. 5. Diabetes mellitus. 6. Hypertension.  SOCIAL HISTORY:  Nonsmoker.  No alcohol or drug abuse.  ALLERGIES:  THORAZINE.  MEDICATIONS:  Prior to admission, 1. Baclofen 10 mg 1 to 2 tablets. 2. Geodon 60 mg p.o. daily at bedtime. 3. Fish oil 1000 mg 2 caps twice daily. 4. Diuretic over-the-counter. 5. Colon cleanser over-the-counter. 6. Multivitamin 1 tablet daily. 7. Metformin 500 mg p.o. b.i.d. 8. Trazodone 100 mg 1 to 2 tablet daily at bedtime. 9. Trilipix 45 mg p.o. daily. 10.Hydrocodone/APAP 5/500 mg q.6 h. p.r.n. 11.Furosemide 40 mg p.o. daily. 12.Lisinopril with hydrochlorothiazide 20/12.5 mg daily. 13.Lovastatin 40 mg p.o.  daily. 14.Sertraline 100 mg p.o. daily at bedtime.  PHYSICAL EXAMINATION:  VITAL SIGNS:  Blood pressure 134/79, pulse rate of 110, respiratory rate 20, temperature 97.9. GENERAL:  The patient is alert, awake and in manic psychotic episode. HEENT:  Anicteric sclerae.  Pupils react to light and accommodation. EOMI. NECK:  Supple.  No lymphopathy.  No JVD. CVS:  Tachycardic and anxious.  S1 and S2, clear. CHEST:  Clear to auscultation bilaterally. ABDOMEN: Soft, nontender, nondistended.  Normal bowel sounds. EXTREMITIES:  No cyanosis, clubbing; however, some puffiness in the lower extremity and ankle, worse on right.  LABORATORY DATA:  CBC shows white count of 9.6, hemoglobin 10.5, hematocrit 31.8, platelets 353, neutrophils 70%.  UA negative for any UTI.  CMP shows sodium of 141,  potassium 5.1, BUN 100, creatinine 6.7, AST 266, ALT 104, albumin 4.2, alcohol level less than 5, urine drug screen positive for opiates.  Renal ultrasound ordered stat by myself shows a right renal atrophy and pelvocaliectasis, normal appearance of left kidney.  IMPRESSION AND PLAN:  Kimberly Gross is a 60 year old female with a history of schizophrenia, manic depression, and acute manic episode, hypertension, hyperlipidemia, diabetes presents with acute manic episode as well as acute renal failure. 1. Acute renal insufficiency, possible also component of chronic     kidney disease secondary to hypertensive nephropathy and diabetic     nephropathy.  The patient is also on multiple nephrotoxic     medications including lisinopril, hydrochlorothiazide, furosemide     as well as metformin.  I ordered renal ultrasound stat in the     emergency room, which showed no obstruction or hydronephrosis.  We     will hold all the diuretics at this time and continue IV fluids for     now, hold lisinopril, hydrochlorothiazide, furosemide, and     metformin.  Renal has been consulted and will evaluate the patient. 2. Acute psychosis with manic depressive, schizophrenia.  Currently,     the patient is in the acute psychotic phase with an active suicidal     ideation and acute hallucinations.  I discussed with Dr. Olevia Perches     from Psychiatry service and per his recommendation, discontinue     sertraline from her medication profile and gave IM Geodon and start     on p.r.n. Geodon to max of 40 mg.  The patient will be seen by     psychiatry in a.m.  One-to-one sitter will be also placed in the     room. 3. Hypertension.  Hold all the diuretics and placed on p.r.n.     hydralazine. 4. Hyperlipidemia.  Obtain lipid panel.  Hold statins secondary to     transaminitis. 5. Transaminitis.  Unclear in etiology.  Hold statins for now.  Repeat     LFTs in a.m. if worsening, we will obtain abdominal ultrasound. 6.  Prophylaxis, bilateral SCDs.     Estill Cotta, MD     RR/MEDQ  D:  08/23/2010  T:  08/23/2010  Job:  [id number]  cc:   Ernestene Kiel, M.D. Fax: [phone]  Mayme Genta, M.D. Fax: [phone]  Electronically Signed by Jacoria Keiffer  on 08/28/2010 03:17:24 PM

## 2010-08-29 DIAGNOSIS — F3110 Bipolar disorder, current episode manic without psychotic features, unspecified: Secondary | ICD-10-CM

## 2010-08-29 LAB — GLUCOSE, CAPILLARY: Glucose-Capillary: 119 mg/dL — ABNORMAL HIGH (ref 70–99)

## 2010-08-30 LAB — GLUCOSE, CAPILLARY
Glucose-Capillary: 94 mg/dL (ref 70–99)
Glucose-Capillary: 111 mg/dL — ABNORMAL HIGH (ref 70–99)

## 2010-08-30 LAB — COMPREHENSIVE METABOLIC PANEL
Alkaline Phosphatase: 43 U/L (ref 39–117)
BUN: 30 mg/dL — ABNORMAL HIGH (ref 6–23)
CO2: 21 mEq/L (ref 19–32)
GFR calc non Af Amer: 25 mL/min — ABNORMAL LOW (ref >60)
Glucose, Bld: 99 mg/dL (ref 70–99)
Potassium: 5 mEq/L (ref 3.5–5.1)
Total Bilirubin: 0.1 mg/dL — ABNORMAL LOW (ref 0.3–1.2)
Total Protein: 6.5 g/dL (ref 6.0–8.3)

## 2010-08-30 NOTE — Discharge Summary (Signed)
  NAME:  Kimberly Gross, Kimberly Gross               ACCOUNT NO.:  [id number]  MEDICAL RECORD NO.:  [id number]           PATIENT TYPE:  I  LOCATION:  [id number]                         [facility]ACILITY:  [facility]  PHYSICIAN:  Verneita Griffes, MD        DATE OF BIRTH:  Jan 28, 1951  DATE OF ADMISSION:  08/23/2010 DATE OF DISCHARGE:  08/27/2010                              DISCHARGE SUMMARY   PRIMARY CARE PHYSICIAN:  Ernestene Kiel, MD  PSYCHIATRIST:  Mayme Genta, MD  PERTINENT CONSULTS: 1. Marlou Sa, MD, with Psychiatry 2. Sol Blazing, MD, with Nephrology.  DISCHARGE DIAGNOSES: 1. Acute kidney injury. 2. Acute psychosis, manic-depressive schizophrenia features. 3. Hypertension. 4. Hyperlipidemia. 5. Transaminitis. 6. Hemorrhoids. 7. Sore throat.  DISCHARGE MEDICATIONS: 1. Hydrocodone/APAP 5/500 p.o. q.6 h. p.r.n. 2. Ziprasidone 80 mg b.i.d. with meals and witch hazel pads 1 pad     p.r.n. 3. Lorazepam 2 mg q.6 h. p.r.n. for agitation. 4. Colace 100 mg daily. 5. NovoLog insulin 2-5 units q.h.s. 6. NovoLog injection 1-9 units t.i.d. with meals. 7. The patient was instructed to stop lisinopril/hydrochlorothiazide,     sertraline, trazodone, metformin, Trilipix, baclofen, lovastatin,     Lasix, multivitamins, colon cleanser, over-the-counter diuretic.   DICTATION CANCELED         ______________________________ Verneita Griffes, MD    JS/MEDQ  D:  08/27/2010  T:  08/27/2010  Job:  [id number]  Electronically Signed by Verneita Griffes MD on 08/30/2010 03:11:25 PM

## 2010-08-31 LAB — GLUCOSE, CAPILLARY
Glucose-Capillary: 87 mg/dL (ref 70–99)
Glucose-Capillary: 115 mg/dL — ABNORMAL HIGH (ref 70–99)

## 2010-09-01 LAB — GLUCOSE, CAPILLARY
Glucose-Capillary: 98 mg/dL (ref 70–99)
Glucose-Capillary: 84 mg/dL (ref 70–99)

## 2010-09-02 LAB — GLUCOSE, CAPILLARY
Glucose-Capillary: 94 mg/dL (ref 70–99)
Glucose-Capillary: 84 mg/dL (ref 70–99)

## 2010-09-03 LAB — GLUCOSE, CAPILLARY: Glucose-Capillary: 110 mg/dL — ABNORMAL HIGH (ref 70–99)

## 2010-09-04 LAB — HEPATIC FUNCTION PANEL
AST: 38 U/L — ABNORMAL HIGH (ref 0–37)
Albumin: 3.5 g/dL (ref 3.5–5.2)
Alkaline Phosphatase: 72 U/L (ref 39–117)
Bilirubin, Direct: <0.1 mg/dL (ref 0.0–0.3)
Total Bilirubin: 0.4 mg/dL (ref 0.3–1.2)

## 2010-09-04 LAB — BASIC METABOLIC PANEL
BUN: 29 mg/dL — ABNORMAL HIGH (ref 6–23)
Calcium: 9.4 mg/dL (ref 8.4–10.5)
Creatinine, Ser: 2.01 mg/dL — ABNORMAL HIGH (ref 0.4–1.2)
GFR calc Af Amer: 31 mL/min — ABNORMAL LOW (ref >60)

## 2010-09-04 LAB — GLUCOSE, CAPILLARY
Glucose-Capillary: 120 mg/dL — ABNORMAL HIGH (ref 70–99)
Glucose-Capillary: 147 mg/dL — ABNORMAL HIGH (ref 70–99)

## 2010-09-04 LAB — CBC
MCH: 28.3 pg (ref 26.0–34.0)
MCV: 90.3 fL (ref 78.0–100.0)
Platelets: 355 10*3/uL (ref 150–400)
RBC: 3.39 MIL/uL — ABNORMAL LOW (ref 3.87–5.11)
RDW: 13.3 % (ref 11.5–15.5)
WBC: 8 10*3/uL (ref 4.0–10.5)

## 2010-09-04 LAB — CK TOTAL AND CKMB (NOT AT ARMC): CK, MB: 6.4 ng/mL (ref 0.3–4.0)

## 2010-09-04 LAB — BRAIN NATRIURETIC PEPTIDE: Pro B Natriuretic peptide (BNP): <30 pg/mL (ref 0.0–100.0)

## 2010-09-04 LAB — PROTIME-INR: Prothrombin Time: 14.1 seconds (ref 11.6–15.2)

## 2010-09-04 LAB — D-DIMER, QUANTITATIVE: D-Dimer, Quant: 1.76 ug/mL-FEU — ABNORMAL HIGH (ref 0.00–0.48)

## 2010-09-04 NOTE — Discharge Summary (Signed)
NAME:  Kimberly Gross               ACCOUNT NO.:  [id number]  MEDICAL RECORD NO.:  [id number]           PATIENT TYPE:  I  LOCATION:  0406                          [facility]ILITY[facility]  [facility]  PHYSICIAN:  Marlou Sa, MD DATE OF BIRTH:  1950-11-16  DATE OF ADMISSION:  08/28/2010 DATE OF DISCHARGE:                              DISCHARGE SUMMARY   IDENTIFYING INFORMATION:  This is a 60 year old African American female. This is a voluntary admission.  HISTORY OF PRESENT ILLNESS:  This is the second or third [facility] admission for Kimberly Gross, a pleasant but somewhat anxious 60 year old female who was initially admitted to our medical unit August 23, 2010, after her husband contacted emergency services.  She said that she had not slept regularly in about 2 weeks and had been cleaning her mother's house nonstop for 2 weeks.  She has a history of schizoaffective disorder and her mother, who she characterizes as a hoarder died about 3 years ago. She was hospitalized with acute renal failure attributed to possibly volume depletion.  She was also noted to have mildly elevated transaminases.  While on the medical unit, she received a psychiatric consult from Dr. Marlou Sa on February 11, and she was noted to have a very disorganized thinking with grandiose themes and expressed an interest in being with her mother who had died 75 years previously.  She had received Geodon 10 mg IM twice in the emergency room and had previously been maintained as an outpatient on Geodon 60 mg twice a day.  She was started on Geodon 80 mg twice daily which continues.  PAST PSYCHIATRIC HISTORY:  Third [facility] admission.  She has been on our unit twice previously, both admissions in 2001.  She was diagnosed at that time with schizoaffective disorder and has displayed significant depression and catatonia in past admissions.  Previous medication trials include Zyprexa 15 mg p.o. q.h.s. and she also has responded well  to Haldol in the past.  She has a history of unknown reaction to Thorazine. History of recent intervening hospitalizations is unclear.  No known history of substance abuse.  SOCIAL HISTORY:  This is a married African American female, 60 years old, who lives at home with her husband.  Her daughter is also supportive.  FAMILY HISTORY:  Father with alcoholism.  ALCOHOL AND DRUG HISTORY:  No known history of substance abuse.  MEDICAL HISTORY:  Primary care physician is Dr. Katherine Roan.  MEDICAL PROBLEMS: 1. Diabetes mellitus type 2. 2. Acute renal failure, resolving. 3. Mild transaminitis of unknown etiology. 4. History of previous seizure.  CURRENT MEDICATIONS: 1. Norvasc 5 mg daily. 2. Geodon 80 mg b.i.d.  DRUG ALLERGIES:  Unknown reaction to Thorazine.  PHYSICAL EXAMINATION:  GENERAL:  Done on the medical unit and in the emergency room, and is noted in the record.  This is an obese [place] American female in no acute physical distress today. VITAL SIGNS:  121 kg, 5 feet 3 inches tall. MENTAL STATUS EXAM:  Fully alert female, disorganized speech, hyperverbal, directs with difficulty.  Impulsivity, judgment and insight all impaired.  No suicidal or homicidal thoughts.  She is asking  for an increase in her Geodon.  Cooperative, recognizes that she needs to be here.  Fully oriented to person, place and situation.  No signs of delirium or confusion.  DISCHARGE DIAGNOSES:  AXIS I: Schizoaffective disorder, manic. AXIS II: No diagnosis. AXIS III: DM2, acute renal failure resolved, mild transaminitis of unknown origin, history of previous seizure. AXIS IV: Deferred. AXIS V: Current 32, past year not known.  PLAN:  Plan is to voluntarily admit her to our stabilization unit.  We are going to check her CBG's q.i.d., place her on sliding scale insulin as needed.  She is on a diabetic diet.  Continue the Geodon 80 mg p.o. b.i.d. with meals, Ativan 2 mg q.6 h p.r.n. for agitation and  will check a C-met in the morning.  She continues to be manic and will  DICTATION ENDED AT [place] A. [name], N.P.   ______________________________ Marlou Sa, MD    MAS/MEDQ  D:  08/29/2010  T:  08/29/2010  Job:  [id number]  Electronically Signed by Lanell Persons N.P. on 09/02/2010 12:10:59 PM Electronically Signed by Marlou Sa  on 09/04/2010 04:52:57 AM

## 2010-09-05 ENCOUNTER — Ambulatory Visit (HOSPITAL_COMMUNITY)
Admit: 2010-09-05 | Discharge: 2010-09-05 | Disposition: A | Discharge status: Home / Self Care | Payer: MEDICARE | Attending: Psychiatry | Admitting: Psychiatry

## 2010-09-05 DIAGNOSIS — M7989 Other specified soft tissue disorders: Secondary | ICD-10-CM

## 2010-09-05 LAB — CK TOTAL AND CKMB (NOT AT ARMC)
CK, MB: 6.9 ng/mL (ref 0.3–4.0)
Relative Index: 1 (ref 0.0–2.5)
Total CK: 679 U/L — ABNORMAL HIGH (ref 7–177)

## 2010-09-05 LAB — GLUCOSE, CAPILLARY: Glucose-Capillary: 88 mg/dL (ref 70–99)

## 2010-09-05 LAB — TROPONIN I: Troponin I: 0.02 ng/mL (ref 0.00–0.06)

## 2010-09-05 LAB — D-DIMER, QUANTITATIVE: D-Dimer, Quant: 1.5 ug/mL-FEU — ABNORMAL HIGH (ref 0.00–0.48)

## 2010-09-06 ENCOUNTER — Other Ambulatory Visit (HOSPITAL_COMMUNITY): Payer: MEDICARE

## 2010-09-06 LAB — GLUCOSE, CAPILLARY
Glucose-Capillary: 134 mg/dL — ABNORMAL HIGH (ref 70–99)
Glucose-Capillary: 105 mg/dL — ABNORMAL HIGH (ref 70–99)
Glucose-Capillary: 81 mg/dL (ref 70–99)
Glucose-Capillary: 97 mg/dL (ref 70–99)

## 2010-09-07 LAB — GLUCOSE, CAPILLARY: Glucose-Capillary: 92 mg/dL (ref 70–99)

## 2010-09-08 LAB — GLUCOSE, CAPILLARY
Glucose-Capillary: 82 mg/dL (ref 70–99)
Glucose-Capillary: 103 mg/dL — ABNORMAL HIGH (ref 70–99)

## 2010-09-09 ENCOUNTER — Other Ambulatory Visit (HOSPITAL_COMMUNITY): Payer: MEDICARE

## 2010-09-09 LAB — GLUCOSE, CAPILLARY
Glucose-Capillary: 79 mg/dL (ref 70–99)
Glucose-Capillary: 94 mg/dL (ref 70–99)

## 2010-09-10 LAB — COMPREHENSIVE METABOLIC PANEL
ALT: 32 U/L (ref 0–35)
BUN: 26 mg/dL — ABNORMAL HIGH (ref 6–23)
CO2: 26 mEq/L (ref 19–32)
Calcium: 9.3 mg/dL (ref 8.4–10.5)
Creatinine, Ser: 1.89 mg/dL — ABNORMAL HIGH (ref 0.4–1.2)
GFR calc non Af Amer: 27 mL/min — ABNORMAL LOW (ref >60)
Glucose, Bld: 84 mg/dL (ref 70–99)

## 2010-09-10 LAB — GLUCOSE, CAPILLARY
Glucose-Capillary: 106 mg/dL — ABNORMAL HIGH (ref 70–99)
Glucose-Capillary: 127 mg/dL — ABNORMAL HIGH (ref 70–99)

## 2010-09-11 LAB — GLUCOSE, CAPILLARY: Glucose-Capillary: 90 mg/dL (ref 70–99)

## 2010-09-12 DIAGNOSIS — F3110 Bipolar disorder, current episode manic without psychotic features, unspecified: Secondary | ICD-10-CM

## 2010-09-12 LAB — GLUCOSE, CAPILLARY
Glucose-Capillary: 88 mg/dL (ref 70–99)
Glucose-Capillary: 99 mg/dL (ref 70–99)

## 2010-09-13 LAB — GLUCOSE, CAPILLARY: Glucose-Capillary: 88 mg/dL (ref 70–99)

## 2010-09-14 LAB — GLUCOSE, CAPILLARY
Glucose-Capillary: 106 mg/dL — ABNORMAL HIGH (ref 70–99)
Glucose-Capillary: 109 mg/dL — ABNORMAL HIGH (ref 70–99)

## 2010-09-15 LAB — GLUCOSE, CAPILLARY
Glucose-Capillary: 77 mg/dL (ref 70–99)
Glucose-Capillary: 105 mg/dL — ABNORMAL HIGH (ref 70–99)

## 2010-09-16 LAB — GLUCOSE, CAPILLARY
Glucose-Capillary: 106 mg/dL — ABNORMAL HIGH (ref 70–99)
Glucose-Capillary: 163 mg/dL — ABNORMAL HIGH (ref 70–99)
Glucose-Capillary: 123 mg/dL — ABNORMAL HIGH (ref 70–99)

## 2010-09-17 LAB — GLUCOSE, CAPILLARY
Glucose-Capillary: 94 mg/dL (ref 70–99)
Glucose-Capillary: 118 mg/dL — ABNORMAL HIGH (ref 70–99)
Glucose-Capillary: 123 mg/dL — ABNORMAL HIGH (ref 70–99)

## 2010-09-18 LAB — GLUCOSE, CAPILLARY
Glucose-Capillary: 120 mg/dL — ABNORMAL HIGH (ref 70–99)
Glucose-Capillary: 118 mg/dL — ABNORMAL HIGH (ref 70–99)

## 2010-09-19 ENCOUNTER — Inpatient Hospital Stay (HOSPITAL_COMMUNITY): Payer: MEDICARE

## 2010-09-19 ENCOUNTER — Ambulatory Visit (HOSPITAL_COMMUNITY): Admission: RE | Admit: 2010-09-19 | Payer: MEDICARE | Source: Other Acute Inpatient Hospital

## 2010-09-19 LAB — GLUCOSE, CAPILLARY
Glucose-Capillary: 90 mg/dL (ref 70–99)
Glucose-Capillary: 114 mg/dL — ABNORMAL HIGH (ref 70–99)

## 2010-09-19 IMAGING — CT CT HEAD W/O CM
1 series · 16 of 30 positions shown, 20 images · non-contrast
Comparison: Report from head CT of [WK] (none available)

CLINICAL DATA: Persistent psychosis.  Confusion.

CT HEAD WITHOUT CONTRAST
TECHNIQUE: Contiguous axial images were obtained from the base of
the skull through the vertex without contrast.

[Series 2: head_seq 4.5 h37s st · axial · 0.43mm/px · z∈[-265,-139]mm · 16 of 32 slices shown, 20 images]
[im 2/32  brain]
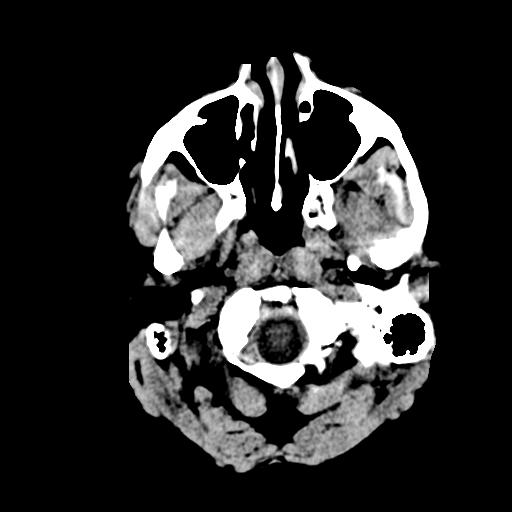
[im 2/32  bone]
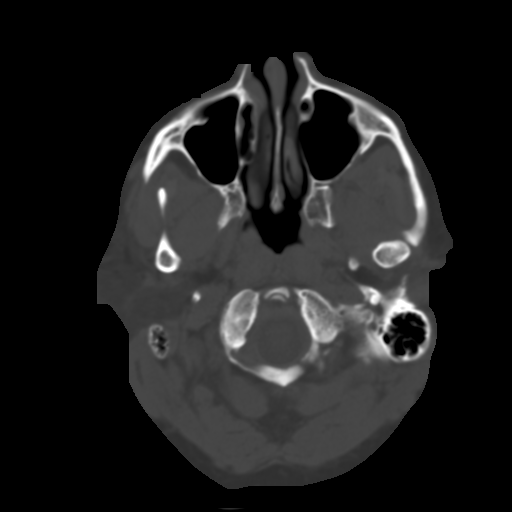
[im 4/32  brain]
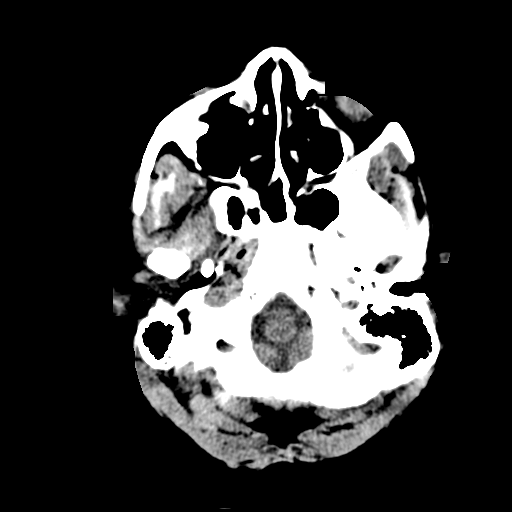
[im 6/32  brain]
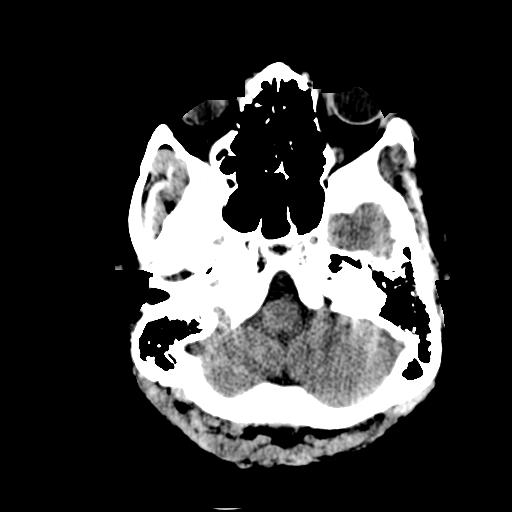
[im 8/32  brain]
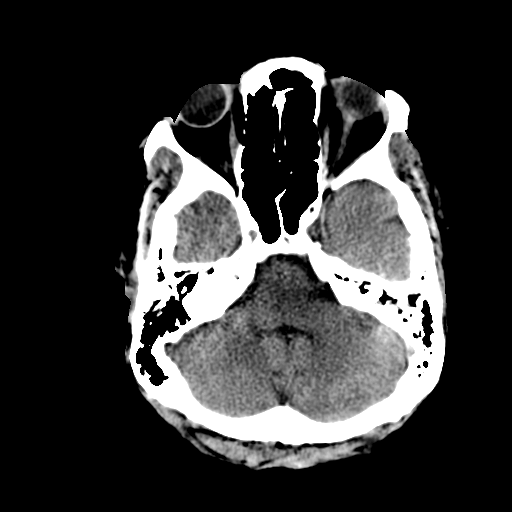
[im 9/32  brain]
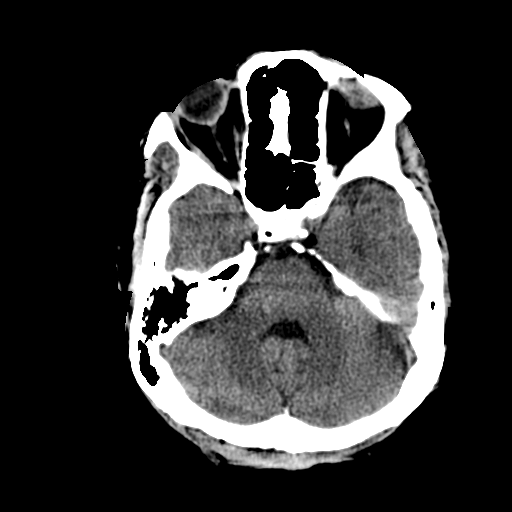
[im 9/32  bone]
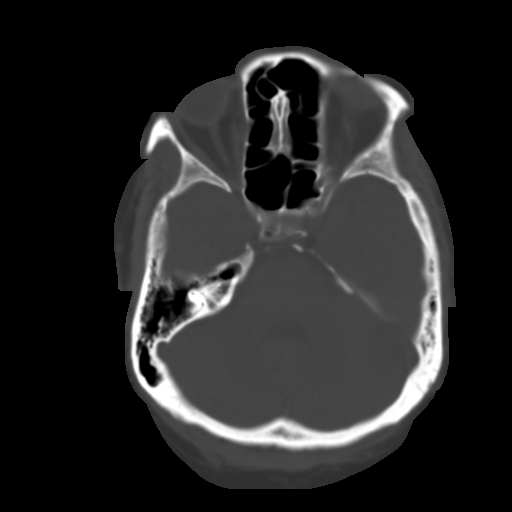
[im 11/32  brain]
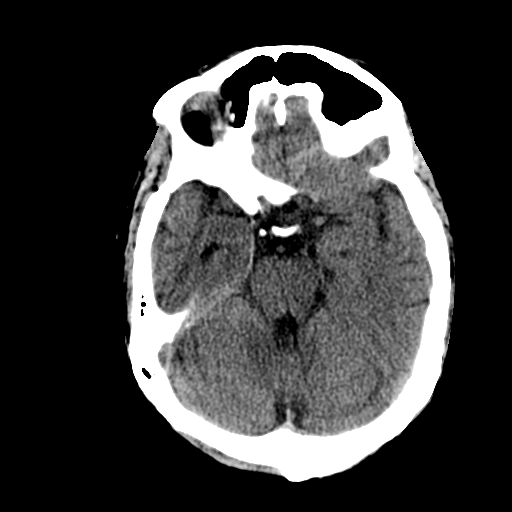
[im 13/32  brain]
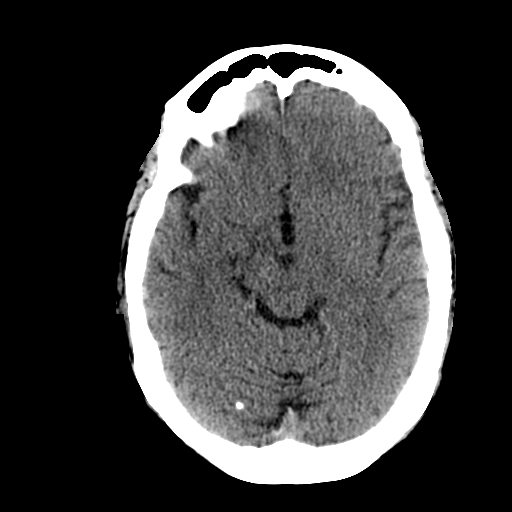
[im 15/32  brain]
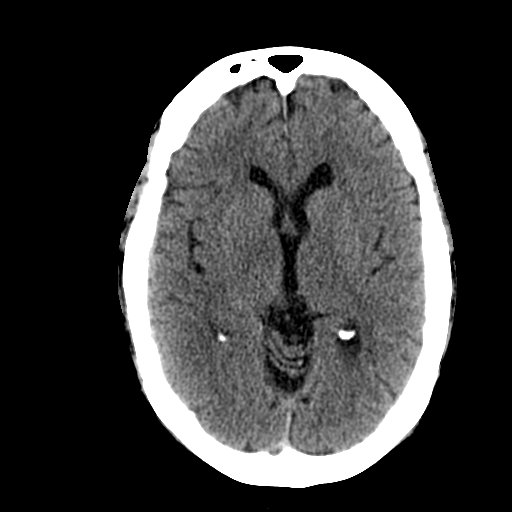
[im 17/32  brain]
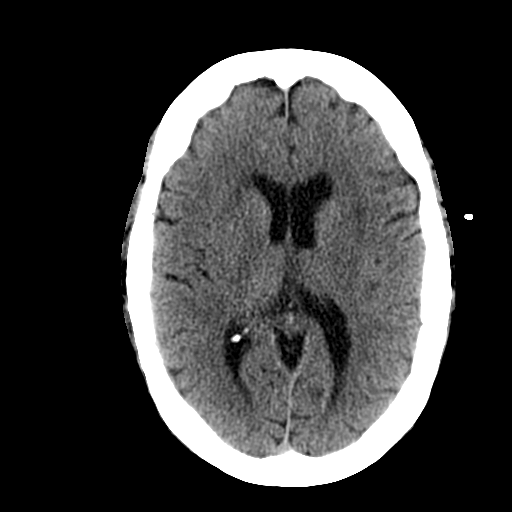
[im 17/32  bone]
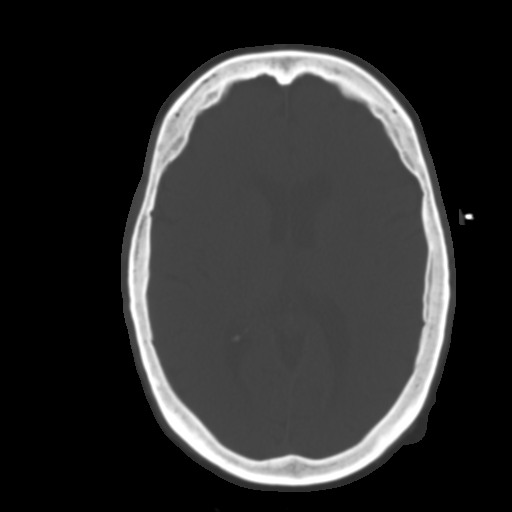
[im 19/32  brain]
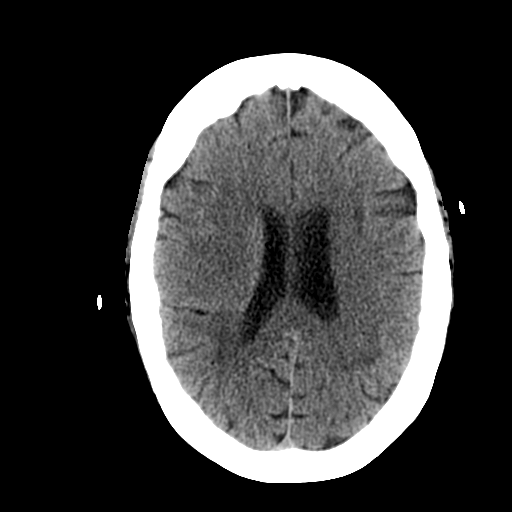
[im 21/32  brain]
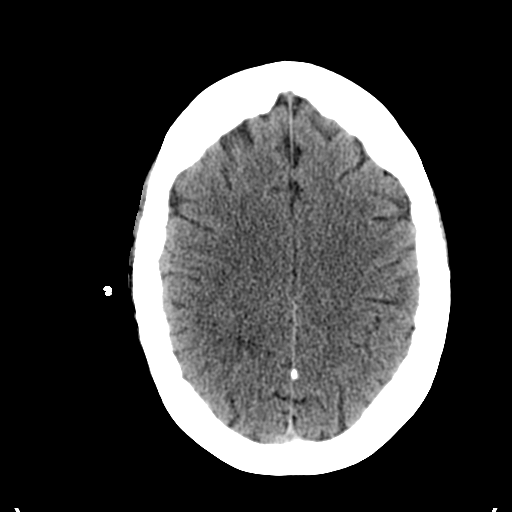
[im 23/32  brain]
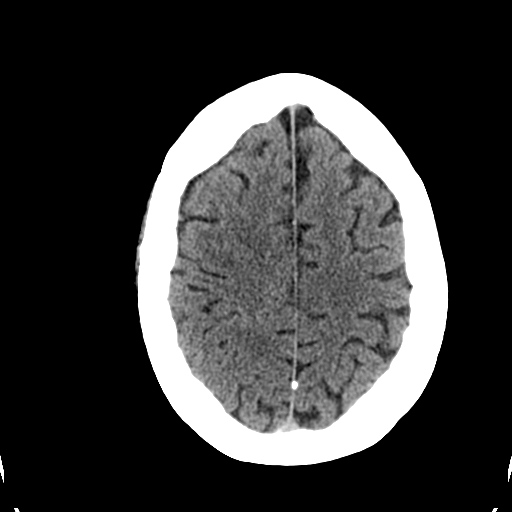
[im 24/32  brain]
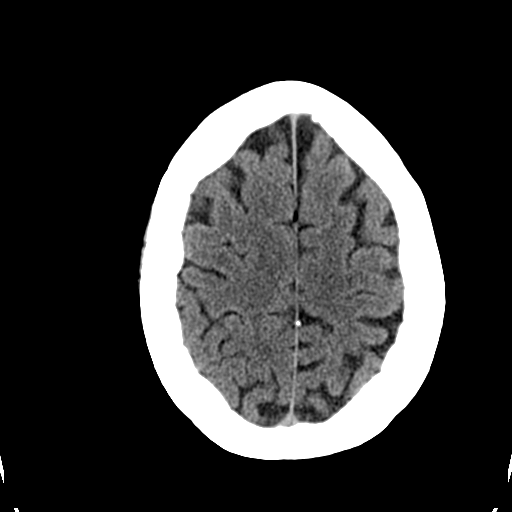
[im 24/32  bone]
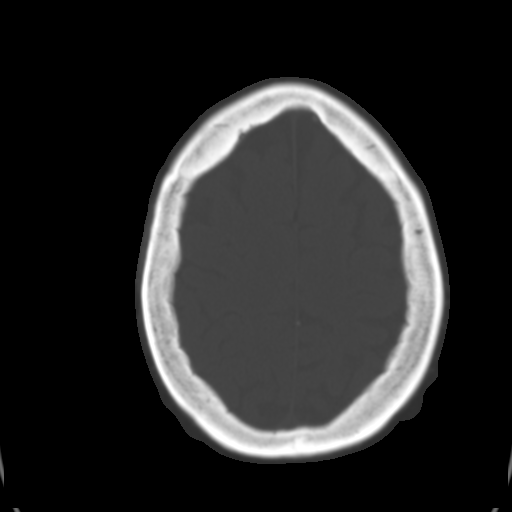
[im 26/32  brain]
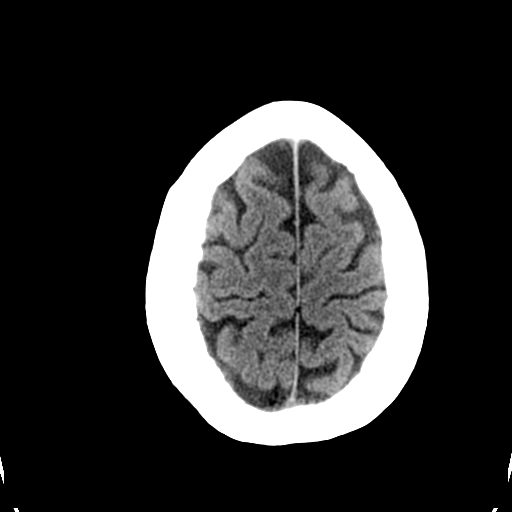
[im 28/32  brain]
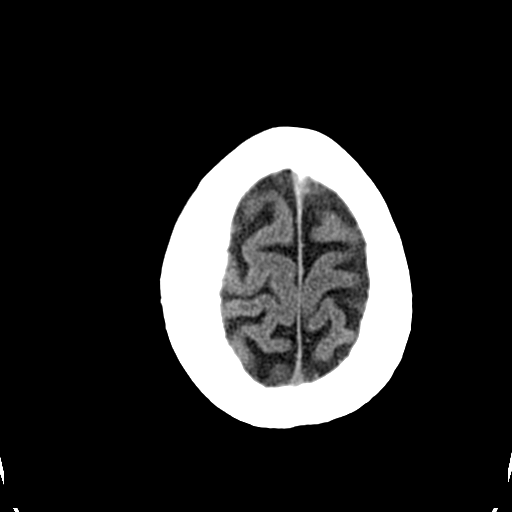
[im 30/32  brain]
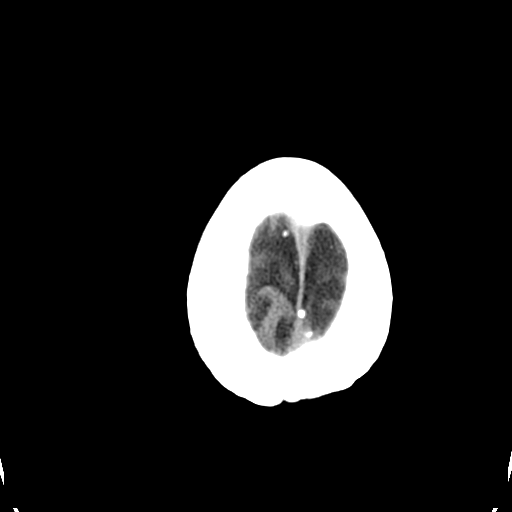

[16 of 30 positions shown; findings below may reference images not displayed]

FINDINGS: No acute intracranial abnormality is identified.
Specifically, no hemorrhage, hydrocephalus, mass effect, mass
lesion, or evidence of acute cortically based infarction.  There is
mild patchy bilateral periventricular hypodensity consistent with
chronic microvascular ischemic changes.

The visualized paranasal sinuses, mastoid air cells, and middle
ears are clear.  The skull is intact.
IMPRESSION: 1.  No acute intracranial abnormality.
 2.  Mild chronic microvascular ischemic changes.

## 2010-09-20 LAB — GLUCOSE, CAPILLARY
Glucose-Capillary: 142 mg/dL — ABNORMAL HIGH (ref 70–99)
Glucose-Capillary: 122 mg/dL — ABNORMAL HIGH (ref 70–99)

## 2010-09-20 NOTE — Consult Note (Signed)
  NAME:  Kimberly Gross, Kimberly Gross               ACCOUNT NO.:  [id number]  MEDICAL RECORD NO.:  [id number]           PATIENT TYPE:  O  LOCATION:  VASC                         FACILITY:  [facility]  PHYSICIAN:  Sharlet Salina, M.D.   DATE OF BIRTH:  March 03, 1951  DATE OF CONSULTATION: DATE OF DISCHARGE:  09/05/2010                                CONSULTATION   ADDENDUM  As noted the in the history and physical, the patient had initially complained of swelling in her lower extremities.  She had Dopplers done that were negative for DVT.  She was empirically placed on Lovenox while the studies were in process.  She also had complained of some chest pain and shortness of breath.  Cardiac markers were fine.  She did have a D- dimer that was elevated.  The patient was offered a VQ scan to rule out PE and kept on an empiric Lovenox treatment dose but the patient refused the VQ scan.  Discussed with the physician's assistant at this time we will cancel the VQ scan since the patient refuses it and discontinue the Lovenox.  Her oxygen level is 100% on room air.  If at any time the patient requests or wants to have the VQ scan if she develops shortness of breath or chest pain again, then that can be done, but at the present time the patient refuses the procedure.     Sharlet Salina, M.D.     NJ/MEDQ  D:  09/07/2010  T:  09/07/2010  Job:  [id number]  Electronically Signed by Sharlet Salina M.D. on 09/19/2010 07:57:29 AM

## 2010-09-20 NOTE — Consult Note (Signed)
NAME:  Kimberly Gross, Kimberly Gross               ACCOUNT NO.:  [id number]  MEDICAL RECORD NO.:  [id number]           PATIENT TYPE:  I  LOCATION:  0406                          [facility]ILITY[facility]  [facility]  PHYSICIAN:  Sharlet Salina, M.D.   DATE OF BIRTH:  January 18, 1951  DATE OF CONSULTATION:  09/04/2010 DATE OF DISCHARGE:                                CONSULTATION   CHIEF COMPLAINT:  Swelling in the right lower extremity, chest pain, shortness of breath.  HISTORY OF PRESENT ILLNESS:  The patient is a 60 year old African American female that was admitted to [facility].  The patient had schizophrenia and bipolar disorder.  We were asked to see the patient for swelling in the right lower extremity.  On discussion with the patient, she complains of chest pain.  She said she has chest pain that is 1/10 that is more a dull pain in the center of her chest.  She complains of some shortness of breath and some nausea but no vomiting. She states that she normally does not have chest pain.  Chest pain she said is worse with inspiration.  Chest wall is nontender to palpation.  PAST MEDICAL HISTORY: 1. Significant for seizure disorder. 2. Acute renal failure on chronic renal failure that is improved,     recently discharged from [facility] and transferred to     [facility] on  August 28, 2010. 3. Hypertension. 4. Obesity. 5. Diabetes. 6. Hyperlipidemia. 7. Chronic kidney disease.  FAMILY HISTORY:  Noncontributory.  SOCIAL HISTORY:  No alcohol or tobacco or drug use.  She is married with one child.  MEDICATIONS: 1. Lorazepam p.r.n. 2. Sliding scale insulin as needed. 3. Depakote 500 b.i.d. 4. Risperdal 2 mg b.i.d. 5. Norvasc 5 mg daily.  ALLERGIES:  THORAZINE.  REVIEW OF SYSTEMS:  Negative.  Otherwise stated in HPI.  PHYSICAL EXAM:  VITAL SIGNS:  Temperature 98.3, respirations 18, blood sugars is 104-120.  Vital sign that is available. GENERAL:  The patient is sitting up in bed,  a well-nourished African American female. HEAD:  Normocephalic, atraumatic. LUNGS:  Clear bilaterally.  No wheezes, rhonchi or rales. HEART:  Seems slightly tachycardiac.  No murmurs, rubs or gallops. ABDOMEN:  Positive bowel sounds. EXTREMITIES:  She has some swelling, about 2+, in the right lower extremity.  LABS:  BNP less than 30.  WBC 8, hemoglobin and 9.6, MCV 90.3, platelets 355,000. Sodium 138, potassium 4.7, chloride 107, CO2 24, glucose 115, BUN 29, creatinine 2.01.  Depakote level 34.8.  AST, ALT mildly elevated on February 17.  ASSESSMENT AND PLAN: 1. Chest pain. 2. Transaminitis. 3. Right lower extremity edema. 4. Chronic renal insufficiency. 5. Seizure disorder. 6. Schizoaffective disorder. 7. Hypertension.  Since the patient is at [facility], we will check an EKG, will check cardiac markers, will do vital signs,  will check pulse ox.  Will get D-dimer, Doppler ultrasound of the right lower extremity.  Start her on a PPI.  Check liver function tests.  I doubt the patient is in heart failure.  Her BNP is normal, so the swelling in her leg, on discussing with her, she  states that she has had that for a while and she was on the Lasix as needed secondary to the edema in that leg.  She cannot tell me, however, if she has ever had Doppler ultrasound of that leg, so we will start her on Lovenox treatment dose and get Doppler of the extremities, check D-dimer, EKG, cardiac markers, do vital signs, look for tachycardia or any decrease in pulse ox.  Based on the results of these tests, then will recommend further management.  Time spent with the patient in doing this consult is approximately 40 minutes.     Sharlet Salina, M.D.     NJ/MEDQ  D:  09/04/2010  T:  09/04/2010  Job:  [id number]  Electronically Signed by Sharlet Salina M.D. on 09/19/2010 07:57:33 AM

## 2010-09-21 LAB — GLUCOSE, CAPILLARY

## 2010-09-22 LAB — GLUCOSE, CAPILLARY
Glucose-Capillary: 87 mg/dL (ref 70–99)
Glucose-Capillary: 163 mg/dL — ABNORMAL HIGH (ref 70–99)

## 2010-09-23 LAB — GLUCOSE, CAPILLARY: Glucose-Capillary: 280 mg/dL — ABNORMAL HIGH (ref 70–99)

## 2010-09-23 NOTE — Discharge Summary (Signed)
  NAME:  Kimberly Gross, Kimberly Gross NO.:  [id number]  MEDICAL RECORD NO.:  [id number]         PATIENT TYPE:  LINP  LOCATION:                               FACILITY:  [facility]  PHYSICIAN:  Sarajane Jews, MD     DATE OF BIRTH:  24-Jul-1950  DATE OF ADMISSION: DATE OF DISCHARGE:  08/28/2010                              DISCHARGE SUMMARY   ADDENDUM:  PRIMARY CARE PHYSICIAN:  Ernestene Kiel, M.D.  PSYCHIATRY:  Mayme Genta, M.D.  Add Norvasc 5 mg p.o. daily.  Images that were done during the admission is renal ultrasound, shows right renal atrophy and normal appearance of the left kidney.  HOSPITAL COURSE:  She is a 60 year old female with a history of schizophrenia, manic depression, bipolar disorder, hypertension, diabetes who was brought to the [facility] Emergency Room with acute manic episode.  History was obtained from the patient's daughter who was present in the room.  Her creatinine when she came in was 6.7 and she was admitted for further evaluation to Medicine.  Patient was seen by Nephrology who recommend that acute renal failure most likely is secondary to volume depletion in conjunction with the diuretic and ACE inhibitor.  ACE inhibitor was held and she received IV fluids at 150 cc/hour.  Also, we sent for electrophoresis to rule out multiple myeloma.  For hypertension, lisinopril, Lasix, and hydrochlorothiazide were discontinued.  Patient also was seen by Psych, who was willing to take the patient to [facility].  PHYSICAL EXAMINATION:  VITALS:  On discharge, temperature is 98.5, pulse is 93, respirations 22, blood pressure is 164/68, and she is saturating 97% on room air. GENERAL:  She is awake, oriented x3, lying in bed, in no acute distress. HEENT:  Normocephalic, atraumatic.  Conjunctivae pink.  Pupils equal, reactive to light bilaterally. LUNGS:  No wheezes or crackles. HEART:  S1 and S2.  No murmur, no gallop, no rub. ABDOMEN:   Soft, bowel sound was present.  Guaiac were negative. GU:  Deferred.  LABORATORY DATA:  Her labs today is, sodium is 140, potassium is 4.7, chloride is 118, bicarb is 19, glucose 100, BUN is 23, creatinine is 1.98.  ASSESSMENT: 1. Acute on top of chronic kidney disease, most likely it was due to a     volume depletion, also cannot rule out medication side effect.  The     patient's ACE, ARB, and hydrochlorothiazide were discontinued and I     put the patient on Norvasc for the blood pressure. 2. Blood pressure.  I will continue with Norvasc for now.  I will ask     primary care to repeat CMP on 2 weeks     from now. 3. Psychosis.  The patient is going to inpatient psychiatric unit. 4. Dyslipidemia.  Statin was held for 2 weeks secondary to increase in     the transaminitis.          ______________________________ Sarajane Jews, MD     SA/MEDQ  D:  08/28/2010  T:  08/29/2010  Job:  [id number]  Electronically Signed by Sarajane Jews MD on 09/23/2010 05:57:52 PM

## 2010-11-29 NOTE — Discharge Summary (Signed)
[facility]  Patient:    Kimberly Gross, Kimberly Gross                      MRN: [id number] Adm. Date:  [id number] Disc. Date: [id number] Attending:  Leola Brazil                           Discharge Summary  BRIEF HISTORY:  Kimberly Gross is a 60 year old African-American married female, admitted with psychosis and bizarre behavior.  The patient apparently had gone to work and began acting very bizarrely and was sent to her medical doctor and was referred to the [facility] Emergency Department.  She was felt to be psychotic and disoriented and was referred for admission.  She had been delusional, religiously preoccupied, and appeared to be having auditory and visual hallucinations, paranoia, thought blocking and agitation.  She had decreased sleep and markedly decreased speech.  At the time of admission she was mute. She appeared quite paranoid and had been refusing medication.  According to the assessment team, the patient had inpatient hospitalization 25 years ago after a "nervous breakdown."  She was followed medically by Dr. Leonel Ramsay.  She had a history of hypertension, hypercholesterolemia and a recent toothache.  MEDICATIONS:  She had been on some opiate pain medication and was on: 1. Zoloft 25 mg q.d. 2. Zocor 20 mg q.d. 3. Lasix 40 mg q.d. 4. K-Dur 20 mEq q.d. 5. Xanax 0.25 mg b.i.d.  ALLERGIES:  She reported being allergic to THORAZINE.  PHYSICAL EXAMINATION:  Physical examination was performed at [facility] with no significant findings other than some mild elevation of her blood pressure.  MENTAL STATUS EXAMINATION:  Revealed a casually dressed, obese, black female. Speech was mute.  Thought processes appeared paranoid with questionable auditory hallucinations.  Unable to access if she was suicidal.  Mood was anxious and affect was anxious and she appeared quite frightened.  Unable to access her cognition.  ADMITTING  DIAGNOSES: Axis I:    Psychotic disorder, not otherwise specified. Axis II:   Deferred. Axis III:  1. Hypercholesterolemia.            2. Hypertension. Axis IV:   Psychosocial stressors severe. Axis V:    Global assessment of functioning, current is 25, highest this past year was 19.  LABORATORY FINDINGS:  Admission CBC initially showed an elevated white count of 11.3, but this later normalized on recheck.  Blood chemistries showed a slightly elevated creatinine of 1.8.  Thyroid panel was normal.  Urine drug screen was positive only for benzodiazepines.  Urinalysis showed trace ketonuria.  HOSPITAL COURSE:  The patient was admitted to the [facility] for treatment of her psychosis.  She was initially placed on IM Haldol and Ativan on a p.r.n. basis.  She was placed on p.o. Risperdal.  The patient remained quite paranoid with decreased sleep and decreased appetite.  She was wondering in the halls with very little speech.  We increased her Risperdal and I would like to add Celexa.  The patient remained very psychotic with auditory hallucinations, religious preoccupation and feelings of guilt.  Her thinking was disorganized.  We elected to try her on Zyprexa and continued her on Celexa.  The patient remained quite agitated.  She continued to report hearing voices and we increased her Zyprexa.  She gradually became less agitated and less paranoid.  She became more coherent and began speaking clearer.  She was less resistant to her treatment and was continued on her Zyprexa.  She developed some chest pain and was transferred to [facility] for evaluation.  She was then admitted.  CONDITION ON DISCHARGE:  The patient is discharged in mildly improved condition with decrease in her agitation and hallucinations, decrease in her paranoia and some improvement in her sleep and organization in her thinking.  DISPOSITION:  The patient was transferred to [facility]  for evaluation of her chest pain.  DISCHARGE MEDICATIONS:  At the time of transfer she was on: 1. Zydis (Zyprexa) 15 mg q.h.s. 2. Celexa 20 mg q.a.m. 3. Pen-Vee K 500 mg q.i.d. 4. Zocor 20 mg q.d. 5. Lasix 40 mg q.d.  FINAL DIAGNOSES: Axis I:    Psychotic disorder, not otherwise specified. Axis II:   No diagnosis. Axis III:  1. Hypercholesterolemia.            2. Hypertension.            3. Chest pain of unknown etiology. Axis IV:   Psychosocial stressors severe. Axis V:    Global assessment of functioning, current was 53, highest past            year was 47. DD:  03/13/00 TD:  03/13/00 Job: [id number]

## 2010-11-29 NOTE — Discharge Summary (Signed)
[facility]  Patient:    Kimberly Gross, Kimberly Gross                      MRN: [id number] Adm. Date:  [id number] Disc. Date: [id number] Attending:  Leola Brazil Dictator:   Dionne D. Mancel Bale, N.P.                           Discharge Summary  ADMISSION DIAGNOSES: 1. Acute meiocytes. 2. Hypertensive cardiovascular disease. 3. Hypercholesterolemia. 4. Elevated creatine kininase. 5. Obesity, moderate.  DISCHARGE DIAGNOSES: 1. Acute meiocytes with rhabdomyolysis. 2. Hypertensive cardiovascular disease, controlled. 3. Hypercholesterolemia. 4. Psychiatry depression with paranoid ideation. 5. Obesity, moderate.  REASON FOR HOSPITALIZATION:  This is a 60 year old African-American woman admitted to [facility] forty-eight days ago prior to admission because of hallucinations, paranoid ideation and delusion.  The patient started to have chest pain, was brought to [facility] ED and her CK was elevated.  ALLERGIES:  THORAZINE.  LABORATORY DATA AND STUDIES:  On February 08, 2000, ECG showed sinus tachycardia, minimal voltage criteria by LVH, may be normal aberrant, cannot rule out anterior infarct, age undetermined.  Chest x-ray on January 31, 2000, showed a central vascular congestion which may be accentuated by decrease in respiratory result.  No definite focal pulmonary consolidation seen with this limited chest radiograph.  On January 31, 2000, chest x-ray showed no significant change in appearance of the lungs compared with the prior study.  On February 08, 2000, CBC at did show WBC of 8.5, hemoglobin 14.0, hematocrit 22.5, platelets 311.  Chemistries on February 08, 2000, showed a sodium of 144, potassium 3.4, chloride of 106, CO2 of 23, glucose 141, BUN 47, creatinine 2.2, calcium 10.2. Total protein 7.7, albumin 4.4, AST 64, ALT 29, ALP 27, total bilirubin 0.8. Chemistries on February 12, 2000, showed a sodium 144, potassium 4.1, chloride 110, CO2 26, glucose 22, BUN  25, creatinine 1.8, calcium 10.5.  Cardiac enzymes first set, CK was 1597, CK 7.6, relative index 7.5, troponin less than 0.3.  Second set was 1396, CK was 7.4, relative index 7.5, troponin was less than 0.03.  On July 29, third set shows CK of 1447, CK-MB was 61, relative index 0.5 and troponin less than 0.03.  The last CK was 263.  On July 28 lipid profile showed cholesterol 184, triglyceride 95, HDL 45, LDL was 121.  On February 15, 2000, lipid profile showed cholesterol 147, triglyceride 124, HDL of 33, LDL of 89.  Urinalysis on January 31, 2000, showed appearance cloudy, ketones was trace, urobilinogen was 1.0.  HOSPITAL COURSE:  The patient was admitted.  Cardiac enzymes continued to be drawn at intervals.  ______ was initiated 500 mg one q.i.d. p.o. x 5 days. A psychiatric consult and treatment was done by Dr. Rhona Raider.  An ECG was also obtained with a urinalysis and CNS.  There was difficulty in getting a venous access on the patient.  A central line was inserted by radiology.  A PICC line was done.  Also restraints were placed for safety of the patient at intervals.  The patient had an episode of hypokalemia.  Potassium supplements were given orally.  After the PICC line was inserted, the patient was started on IV therapy.  Case manager worked closely with patient and husband about social problems at home.  The patient had to be given Ativan at intervals for agitation.  The patient was stared on  Seroquel 75 mg per Dr. Rhona Raider and was increased at intervals.  It was increased to 125 mg.  DISCHARGE CONDITION:  The patient was discharged to home with husband.  The patient did not have any chest pain and the patient was alert, oriented, cooperative, with no agitation.  DISCHARGE MEDICATIONS: 1. Ativan 1 mg one b.i.d. p.r.n. for anxiety. 2. Seroquel 125 mg one q.d.  DISCHARGE INSTRUCTIONS:  Diet, regular.  Activity, as tolerated.  Appointment, call for an appointment with Dr.  Katherine Roan in two weeks.  SPECIAL INSTRUCTIONS:  Followup psych instruction from Dr. Rhona Raider.  Go to [facility] tomorrow on August 8 at 9 a.m. for a partial hospitalization.  The outpatient hospitalization is on the lower level, 9 a.m. to 2 p.m.  His monitor was watched closely with DD:  03/05/00 TD:  03/06/00 Job: [id number]

## 2010-11-29 NOTE — Discharge Summary (Signed)
[facility]  Patient:    Kimberly Gross, Kimberly Gross                      MRN: [id number] Adm. Date:  [id number] Disc. Date: [id number] Attending:  Aldona Bar J                           Discharge Summary  HISTORY OF PRESENT ILLNESS:  Kimberly Gross is a 60 year old black married female who was admitted on involuntary papers due to psychotic behavior.  At the time of initial examination, the patient seemed to be unresponsive/catatonic. Apparently, she became noncompliant with medication for past two months and became acutely psychotic.  Husband noted decrease of sleep and appetite, being withdrawn, frequently lying in fetal position, noncommunicative and acting in paranoid way.  Patient has past psychiatric history of being admitted to [facility] last year with similar symptoms.  She was treated on outpatient basis by Dr. Dola Argyle.  Medically, she suffers from hypertension but recently she was not compliant with both her psychiatric and medical treatment.  HOSPITAL COURSE:  After being admitted to the ward, patient was placed on special observation and started on Risperdal 1 mg at bedtime.  Unfortunately, patient did not comply with medication and refused to take medication by mouth.  On June 12, 2000, she still remained seclusive.  Patient was discussed with Dr. Lajuana Ripple who recommended trying the patient on Haldol.  On June 15, 2000, she was very sedated but responsive.  She started taking Zyprexa which seemed to be helpful.  On June 16, 2000, she was very passive, still responding to hallucinations but at least started eating and drinking again.  On the top of Zyprexa, I put patient on a dose of Haldol.  A few days later, she made some improvement but it required monitoring of her medication.  Vital signs were stable and food intake had improved.  Gradually, the Haldol dose was increased and on June 21, 2000 patient improved enough to hold  reasonable conversation, sleeping better and even having glimpse at insight understanding that she needs to take medication.  Because of financial difficulties with medication, I replaced Risperdal which was taken previously with Haldol which would give her a potent generic form.  There were no side effects from the medication during the brief hospitalization.  On July 02, 2000, patient continued improving, limited insight, but promised to take medication.  Both husband and her felt that she is ready to go home and I agreed with this subsequently providing mandatory followup on outpatient basis.  During stay in the behavioral health unit, no blood work was done, but, prior to admission to emergency room, patient had normal CBC and Chemistry-17.  Vital signs were stable with normal blood pressures, respiration rate, and pulse.  Temperature was normal.  DISCHARGE DIAGNOSES: Axis I:     Schizoaffective disorder, depressed type, with catatonia. Axis II:    Deferred. Axis III:   Arterial hypertension stable. Axis IV:    Moderate stressor; related to primary support group. Axis V:     Global Assessment of Functioning on admission was 20, upon             discharge was 41, maximum for past year 60.  DISCHARGE RECOMMENDATIONS:  Patient received a prescription for Haldol 1 mg twice a day and 2 mg at bedtime, Zyprexa 20 mg at bedtime, Lasix 20 mg daily, K-Dur 20 _mg daily,  Cardura 10 mg daily, Colace 100 mg daily.  She is not to return to work until seen by Dr. Dola Argyle.  She is to call with any problems with medication or recurrence of symptoms.  She has appointment with Dr. Dola Argyle on June 30, 2000 at 3 p.m.  Patient understood the instructions and was discharged in good condition in care of her husband. DD:  07/19/00 TD:  07/20/00 Job: [id number]

## 2010-11-29 NOTE — Consult Note (Signed)
[facility]  Patient:    Kimberly Gross, Kimberly Gross Visit Number: [id number] MRN: [id number]          Service Type: MED Location: [place] [facility] Attending Physician:  Leola Brazil Dictated by:   Jill Alexanders, M.D. Proc. Date: 11/21/01 Admit Date:  11/20/2001   CC:         Leola Brazil., M.D.  [facility]; [address] [place], M.D.   Consultation Report  HISTORY OF PRESENT ILLNESS:  Kimberly Gross is a 60 year old right-handed black female born [birth year] with a history of significant obesity and hypertension.  This patient was admitted with a history of left-sided trembling events beginning on the shoulder, arms, spreading to the legs without loss of consciousness or confusion.  Patient could not walk well after the events.  These spells were occurring on and off for three days prior to admission.  Patient was brought into the hospital on May 10.  Was noted to have blood sugar greater than 500 on admission.  Patient has been treated for this and blood sugars have been coming down slowly.  Last evening the patient began having seizures again, jerking on both sides with loss of consciousness. Patient has remained comatose over the last several hours.  No more jerking is noted.  Neurology has been asked to see this patient for further evaluation. Again, CBGs during the seizure around the 220 range.  Patient has also begun running some fevers with a most recent temperature of 101.3.  Patient has had some problems with hypotension after the seizure started.  PAST MEDICAL HISTORY: 1. New onset of seizure events, comatose state. 2. New onset diabetes, blood sugars greater than 500 on admission. 3. History of obesity. 4. Hypertension. 5. Status post hysterectomy. 6. History of depression.  MEDICATIONS: 1. Dilantin. 2. Potassium 10 mEq b.i.d. 3. Lasix 40 mg q.d. 4. Zoloft 50 mg q.d. 5.  Zyprexa 5 mg daily.  ALLERGIES:  THORAZINE.  SOCIAL HISTORY:  Patient does not smoke or drink.  This patient is married. Lives in the [place] area.  Has not worked in at least two years, on disability for depression.  Has two children who are alive and well.  FAMILY HISTORY:  Mother is alive.  Father died with alcoholism.  Patient has two brothers.  Their medical history is unknown.  Patient has one maternal aunt with diabetes.  REVIEW OF SYSTEMS:  Cannot be obtained at this time.  PHYSICAL EXAMINATION  VITAL SIGNS:  Blood pressure currently 100/46, heart rate 136, respiratory rate 18, temperature 101.3.  GENERAL:  This patient is a markedly obese black female who is comatose at the time of examination.  HEENT:  Head is atraumatic.  Eyes:  Pupils are 3 mm, react to light.  Disks are flat bilaterally.  Venous pulsations are not seen.  Patient has positive dolls eyes, positive corneals.  Does have some minimal response to nasopharyngeal stimulation.  RESPIRATORY:  Relatively clear.  CARDIOVASCULAR:  Rapid heart rate, grade [id number] systolic ejection murmur left lower sternal border noted.  EXTREMITIES:  Ankle edema 1-2+.  NEUROLOGIC:  Cranial nerves as above.  Patient has decreased tone on all fours, supple neck.  Patient has depressed reflexes on all fours.  Toes are neutral bilaterally.  Patient has no response to deep pain stimulation all four extremities.  LABORATORIES:  Blood gas:  pH 7.308, pCO2 27.3, pO2 84.  White count 8.3, hemoglobin 13.2,  hematocrit 38.2, platelets 248,000.  Sodium 137, potassium 3.3, chloride 109, CO2 20, glucose 246, BUN 11, creatinine 1.6, total bilirubin 1.3, alkaline phosphatase 144, SGOT 19, SGPT 15, total protein 6.6, albumin 3.3, calcium 8.8.  Urinalysis reveals specific gravity 1.020, pH 5.5, glucose greater than 100 mg/dl, greater than 80 ketones.  CT scan of the head is pending at this time.  IMPRESSION: 1. New onset seizure  events, left body onset with secondary generalization. 2. New onset diabetes. 3. Obesity. 4. Hypertension.  This patient clearly is unresponsive at this point.  Patient has not received any significant sedative medications and is not clinically actively seizing at this point.  Patient certainly could be postictal at this time, but need to rule out any other focal brain process that may be causing seizure events. Patient has been on Zoloft and Zyprexa.  Need to rule out neuroleptic malignant syndrome or serotonin syndrome and rule out herpes encephalitis or meningitis.  Elevated blood sugars alone may place the patient at risk for seizure events.  Also need to rule out focal brain injury such as cerebrovascular disease.  PLAN: 1. CT scan of brain. 2. Consider lumbar puncture. 3. IV fosphenytoin to be given given the patients low blood pressures, 800    mg to be given now.  Patient has already received 200 mg of Dilantin. 4. Follow Dilantin level. 5. EEG study in a.m. 6. MRI scan of brain when appropriate.  Will follow patients clinical course    while in-house. Dictated by:   Jill Alexanders, M.D. Attending Physician:  Leola Brazil DD:  11/21/01 TD:  11/23/01 Job: [id number]

## 2010-11-29 NOTE — Discharge Summary (Signed)
[facility]  Patient:    Kimberly Gross, Kimberly Gross Visit Number: [id number] MRN: [id number]          Service Type: MED Location: 3W [id number] Attending Physician:  Leola Brazil Dictated by:   Pablo Lawrence Mancel Bale, N.P. Admit Date:  11/20/2001 Discharge Date: 12/02/2001   CC:         Jill Alexanders, M.D.  Drue Stager. Williford, M.D.   Discharge Summary  PROCEDURES:  On Nov 21, 2001, triple lumen central line inserted and, on Nov 21, 2001, lumbar puncture obtained.  ADMITTING DIAGNOSES: 1. New onset diabetes.  Blood sugars greater than 500 on admission. 2. Hypertension. 3. History of obesity. 4. History of depression. 5. New onset of seizure event, comatose state.  DISCHARGE DIAGNOSES: 1. Type 2 diabetes mellitus, uncontrolled on insulin. 2. Hypertensive cardiovascular disease. 3. Urinary tract infection - yeast. 4. Seizure disorder, controlled. 5. Obesity, marked. 6. Depression, major. 7. Poor following instructions at present.  REASON FOR HOSPITALIZATION:  The patient is a 60 year old black female with a history of significant obesity and hypertension.  Patient was admitted with a history of left-sided tremulousness beginning on the shoulder, arms, and spreading to the legs without loss of consciousness or confusion.  Patient could not walk well after the event.  These spells are occurring on and off for three days prior to admission.  Patient was brought into the hospital on May 10 and was noted to have blood sugar greater than 500 on admission. Patient had been treated for this and blood sugars have been coming down slowly  ______ even though patient began having seizures again, jerking on both sides, loss of consciousness.  Patient had remained comatose over the last several hours.  ALLERGIES:  THORAZINE.  LABS AND STUDIES:  On Nov 20, 2001, ABG on room air showed a pH of 7.308, PCO2 of 27.3, PO2 of 84, bicarbonate of 13.4.  On Nov 20, 2001, CBC was within normal limits.  On May 15, WBC showed 10.6, hemoglobin 9.1, hematocrit 26.7, platelets 237.  On Nov 28, 2001, chemistries were within normal limits, sodium 133, CO2 17, glucose 578, creatinine 2.0.  On May 15, potassium was 2.9, glucose 203, BUN 30, creatinine 1.8.  On May 21, sodium was 140, potassium 3.9, chloride 109, CO2 25, glucose 79, BUN 9, creatinine 1.4, and calcium 8.2. On Nov 21, 2001, hemoglobin A1C was 15.6.  On May 17, hemoglobin was 13.7. Dilantin level on Nov 22, 2001, was 6.9.  On May 15, Dilantin level was 11.1. Blood cultures on Nov 21, 2001, showed Staphylococcus species.  On Nov 21, 2001, showed no growth after five days.  On Nov 21, 2001, urine culture showed no growth after one day.  Catheterized urine cultures showed 100,000 colonies of yeast.  CT of the head showed mild ______ decreased attenuation of the deep white matter surrounding the posterior occipital horn to the lateral ventricle.  This is nonspecific.  If the patient had malignant hypertension, this could be seen with hypertensive encephalopathy.  Otherwise, unremarkable head CT.  MRI of the brain on May 14 negative except for minimal small vessel disease affecting the white matter of the cerebral hemisphere.  No acute or reversible process was seen.  HOSPITAL COURSE:  Patient was admitted.  An 1800-calorie ADA soft diet was ordered.  A sliding scale of Regular insulin was started.  Patient was started on Lasix 40 mg.  IV therapy was initiated.  Patient was started initially  for the blood glucose of 578, started off with IV Regular insulin for coverage and the patient was also started on Glucomander, low sensitive, were 0.1 multiplier, target of 122 to 200.  Also, patient was started on Dilantin 100 mg IV.  Blood cultures were obtained, then Dilantin was increased to 100 mg t.i.d. IV.  Patient had a triple lumen catheter inserted.  A CT of the brain without contrast was obtained and a  lumbar puncture was done in the room by Dr. Jannifer Franklin.  Also, Dr. Jannifer Franklin did a neurologic consult on the patient.  Zoloft was discontinued and ______ was discontinued.  He changed his Dilantin to 100 mg IV q.8h. and Dilantin level was drawn and monitored.  Ativan 2 mg IV p.r.n. was given for seizure activity.  He also started the patient on fosphenytoin, started the patient off at 800 mg.  Patient was transferred to the ICU due to the low blood pressure for which the patient was started on dopamine. Cerebral spinal fluid studies were done after the lumbar puncture was done. Then, the patient had another seizure activity for which Ativan was given. The Dilantin was changed to 200 mg q.12h. and she will receive another dose of fosphenytoin.  Dilantin level continued to be monitored.  The patient was n.p.o.  She was given Lasix 20 mg IV q.d., and potassium was added to her IV fluids.  Patient had a positive blood culture of Staphylococcus species.  She was started on vancomycin 1 g IV q.d.  Patient was able to be weaned off the dopamine.  Patient was on an insulin drip and was able to have the insulin drip discontinued.  She was tried to be started on a clear liquid diet.  She had a sliding scale of Regular and patient improved.  I was able to get up out of bed to chair, had speech therapy to evaluate the swallowing.  She was able to come from a recumbent to a state, continued to improve where she was able to have a mechanical soft diet with thin liquids.  Blood glucose continued to fluctuate and to try to continue to attempt at finding a therapy that would control her blood sugars.  She was started on a Regular insulin IV to a one-time dose with 20 units of R subcu.  Also, she was started on Actos 15 mg tablet one q.d. with 5 p.m. dinner.  Also, she was started on Humulin 70/30 insulin, 15 units in the morning and 15 units at p.m.  She was started back on  ______ 2.5 mg mg.  Her vancomycin was  discontinued due to a UTI with yeast. She was started on Diflucan 100 mg tablets.  Her potassium was low.  She was started on Diltiazem p.o.  PT is to assist the patient for gait training. Patient was able to be transferred from ICU to a telemetry unit.  She was started on Zoloft 50 mg tablet p.o. q.d.  Patient and husband was taught how to use it to administrate insulin and it was arranged for the patient to have diabetic outpatient teaching and she was given a CBG machine and a hemoglobin A1C was 13.  Patient had some probable constipation.  She was given a laxative of choice order and started on Miralax.  Her insulin was held for CBG less than 115.  Patient did had a bowel movement.  She may even get ______ Fleets enema and the ______ changed to 2.5 mg dose.  Her  Dilantin was changed to 300 mg p.o. q.h.s. and the Lasix was changed to IV to p.o. route of 40 mg.  She was able to have her Humulin 70/30 decreased to 10 units subcu q.a.m., a.c. breakfast, and hold if his CBG is less than 100.  DISCHARGE CONDITION:  Patient continued to improve such that she can manage her condition with her husband.  DISCHARGE MEDICATIONS:  The discharge medication instructions are not available at time of dictation. Dictated by:   Pablo Lawrence. Mancel Bale, N.P. Attending Physician:  Leola Brazil DD:  12/21/01 TD:  12/24/01 Job: [id number]

## 2010-11-29 NOTE — Procedures (Signed)
NAME:  Kimberly Gross, Kimberly Gross               ACCOUNT NO.:  [id number]   MEDICAL RECORD NO.:  [id number]          PATIENT TYPE:  OUT   LOCATION:  SLEEP CENTER                 FACILITY:  [facility]   PHYSICIAN:  Clinton D. Annamaria Boots, MD, FCCP, [place] OF BIRTH:  [birth year]   DATE OF STUDY:  08/16/2006                            NOCTURNAL POLYSOMNOGRAM   REFERRING PHYSICIAN:  Vincente Liberty MD   INDICATION FOR STUDY:  Hypersomnia with sleep apnea.   EPWORTH SLEEPINESS SCORE:  8/24. BMI 48.5, weight 285 pounds.   MEDICATIONS:  Home medication list is reviewed.   SLEEP ARCHITECTURE:  Total sleep time 396 minutes with sleep deficiency  93%. Stage I was 4%, stage II 75%, stages III and IV 1%. REM 20% of  total sleep time. Sleep latency 6 minutes, REM latency 101 minutes.  Awake after sleep onset 26 minutes. Arousal index 9.7. No bedtime  medication was taken.   RESPIRATORY DATA:  Apnea/hypopnea index (AHI, RDI) 7.7 obstructive  events per hour indicating mild obstructive sleep apnea/hypopnea  syndrome. There were 49 obstructive apnea's and 2 hypopnea's. Events  were not positional. REM AHI 37.3. There were insufficient events to  permit use of CPAP titration by split protocol on this study night.   OXYGEN DATA:  Moderate snoring with oxygen desaturation to a nadir of  78%. Mean oxygen saturation through the study was 98% on room air.   CARDIAC DATA:  Normal sinus rhythm.   MOVEMENT-PARASOMNIA:  No significant limb jerks or unusual movement.   IMPRESSIONS-RECOMMENDATION:  1. Unremarkable sleep architecture for sleep center environment.  2. Mild obstructive sleep apnea/hypopnea syndrome, AHI 7.7 obstructive      events per hour, non positional. Moderate snoring with oxygen      desaturation to a nadir of 78% and a mean oxygen saturation of 98%      through the study. Desaturation was primarily associated with      apneic events.  3. Scores in this range are not usually addressed with CPAP unless      symptoms are significant or there is evidence      such as hypertension pointing to medically significant sleep apnea      effect not relieved by more conservative measures.      Clinton D. Annamaria Boots, MD, [facility], FACP  Diplomate, Tax adviser of Sleep Medicine  Electronically Signed     CDY/MEDQ  D:  08/23/2006 11:31:41  T:  08/23/2006 16:42:38  Job:  [id number]

## 2010-11-29 NOTE — H&P (Signed)
[place]  Patient:    Kimberly Gross, Kimberly Gross                      MRN: [id number] Adm. Date:  [id number] Attending:  Jesse Sans Dictator:   Gala Murdoch, N.P.                   Psychiatric Admission Assessment  IDENTIFYING INFORMATION: Kimberly Gross is a 60 year old African American married female admitted January 29, 2000, for psychosis and bizarre behavior on a voluntary basis. She was referred by [facility].  HISTORY OF PRESENT ILLNESS:  The patient apparently went to work yesterday and was acting rather bizarrely and she was sent to her medical doctor who subsequently sent her to [facility] Emergency Department.  At [facility] Emergency Department they felt she was psychotic and disoriented and referred her to [facility] for psychosis.  She is here on a voluntary basis. Apparently, this patient is delusional, religiously preoccupied, does admit to auditory and visual hallucinations, paranoia, thought blocking, cannot answer questions.  No suicidal ideation or homicidal ideation that we know of. Apparently the patient has not been sleeping, averaging maybe 5-6 hours a night.  She is depressed, hopeless.  When I talked to her today, she has difficulty answering questions. Occasionally, she can get out a yes or no, but very hesitant to speak.  In fact, she is mute.  She is paranoid.  She refuses her medications, thinking we are trying to kill her.  She did say "Jesus" one time but then did not talk to [place] anymore.  PAST PSYCHIATRIC HISTORY:  According to the assessment team, she had an inpatient hospitalization 25 years ago and apparently had a "nervous breakdown", no other information known.  We do not know if she is seeing a psychiatrist now or any counseling now.  PAST MEDICAL HISTORY:  The patient sees Dr. Leonel Ramsay.  Medical problems as far as we can ascertain are hypertension, hypercholesterolemia, toothache. There is  some question if she has been taking pain medication, i.e., opiates, for unknown pain and we do not know the amount.  CURRENT MEDICATIONS:  As listed, Zoloft 25 mg q.d., Zocor 20 mg q.d., Lasix 40 mg q.d., K-Dur 20 mEq q.d., Xanax 2.5 mg b.i.d. p.o and again there is a question if she has been taking some opiates.  We do not know this at this point.  DRUG ALLERGIES:  THORAZINE, do not know what reaction was or when this was given.  SOCIAL HISTORY:  Unable to obtain due to patients being mute.  She apparently is married, has two children and she works at [employer] here in [place].  FAMILY HISTORY:  Unknown.  ALCOHOL/DRUG HISTORY:  No apparent substance abuse that we are aware of.  PHYSICAL FINDINGS:  Please see physical exam done at [facility] Emergency Department on January 29, 2000.  Her blood pressure was elevated.  Currently is 142/86.  MENTAL STATUS EXAM:  An obese African-American female dressed casually.  She has some mental retardation.  She is unable to cooperate.  Speech:  She is mute.  Answered a total of about three questions with one word but would not elaborate.  Mood:  She appears scared and anxious.  Affect:  Paranoid, scared, unable to find out if she is suicidal because the patient will not respond. Thought process:  Patient apparently is having auditory hallucinations.  She is paranoid.  She is delusional.  Religious preoccupation.  She thinks we are trying to poison her so she will not take her medication.  Cognitive:  Unable to ascertain due to psychosis.  CURRENT DIAGNOSES: Axis I:   Psychotic disorder, not otherwise specified. Axis II:  Deferred. Axis III: Hypercholesterolemia, hypertension, toothache. Axis IV:  Severe. Axis V:   Current global assessment of functioning 25, highest past year 96.  CURRENT TREATMENT PLAN AND RECOMMENDATION:  Voluntary admission to [facility] Unit.  Will check her every 15 minutes, maintain safety. Haldol 5 mg p.o.  or IM q.4h. p.r.n. for agitation, Ativan 1 mg p.o. or IM q.4h. p.r.n., anxiety, Restoril 1 mg p.o. stat. At this time the patient has refused any p.o. medications, Risperdal 0.5 mg in the a.m. and 1 mg hs. p.o. We are going to contact Dr. Leonel Ramsay, her medical doctor to find out her current medications, doses as well as medical diagnoses and if she is on anything for her pain since there is some question of opiates.  Tentative length of stay and discharge plan 3-5 days. DD:  01/30/00 TD:  01/31/00 Job: [id number]

## 2010-11-29 NOTE — Procedures (Signed)
[facility]  Patient:    SKYLAN, SAMPAT Visit Number: [id number] MRN: [id number]          Service Type: MED Location: [place] 01 Attending Physician:  Leola Brazil Dictated by:   Jill Alexanders, M.D. Proc. Date: 11/21/01 Admit Date:  11/20/2001                             Procedure Report  PROCEDURE:  Lumbar puncture.  HISTORY:  This patient is a 60 year old black female with recurring generalized seizures consistent with status epilepticus. The patient presented with elevated blood sugars greater than 500 and has been running a fever of 101.3. A lumbar puncture was performed to rule out meningitic process or herpes encephalitis.  DESCRIPTION OF PROCEDURE:  A lumbar puncture was performed with the patient in the fetal position on the left side. The lower back was cleaned with a Betadine solution and a 20 gauge spinal needle was inserted in the L3-4 interspace. Approximately 18 cc of clear colorless spinal fluid was removed for testing. Opening pressure was 270 mm of water. Tube #1 was sent for VDRL, cryptococcal antigen and herpes PCR. Tube #2 was sent for bacterial antigen panel. Tube #3 was sent for cell differential, glucose, and proteins. Tube #4 was saved. There were no complications of the procedure. Dictated by:   Jill Alexanders, M.D. Attending Physician:  Leola Brazil DD:  11/21/01 TD:  11/21/01 Job: [id number]

## 2011-03-07 NOTE — Consult Note (Signed)
NAME:  Kimberly Gross, Kimberly Gross               ACCOUNT NO.:  [id number]  MEDICAL RECORD NO.:  [id number]           PATIENT TYPE:  I  LOCATION:  [id number]                         FACILITY:  [place]:  Sol Blazing, M.D.DATE OF BIRTH:  1951-03-08  DATE OF CONSULTATION:  08/24/2010 DATE OF DISCHARGE:  08/23/2010                                CONSULTATION   REASON FOR CONSULTATION:  Acute renal failure.  HISTORY OF PRESENT ILLNESS:  The patient is a 60 year old female with long history of schizophrenia, bipolar disorder, hypertension and diabetes.  She was admitted  yesterday on August 23, 2010, with worsening psychiatric symptoms including not sleeping for 2 weeks, manic symptoms, eating poorly and not taking her psychiatric medications.  She was found have a BUN of 100 and creatinine 6.7 and was admitted for further evaluation and treatment.  The patient's last creatinine in system here was 1.4 in May of 2003. She denies any history of kidney disease.  Creatinine was 6.7 on admission down to 5.0 last night and 3.5 today.  She is making good urine and has no complaints currently.  She has been restarted on her psychiatric medications.  Ultrasound showed an atrophic right kidney 8.4 cm and left kidney of 10.8 cm with no hydronephrosis.  Urinalysis showed 3 to 6 red blood cells, few bacteria, and few epithelial cells with no protein.  PAST MEDICAL HISTORY:  The patient has a history of seizure disorder, onset in 2003; psychiatric illness with schizophrenia and bipolar disorder; hypertension; obesity; diabetes; hyperlipidemia; chronic kidney disease possibly with creatinine of 1.4 in May of 2003.  PAST SURGICAL HISTORY:  Noncontributory.  SOCIAL HISTORY:  No alcohol, tobacco, or drug use.  ALLERGIES:  THORAZINE.  MEDICATIONS ON ADMISSION:  The patient was taking baclofen, Geodon, metformin, trazodone, Trilipix, Lasix 40 p.o. daily, lisinopril/HCTZ 20/12.5 daily, lovastatin and  Zoloft.  REVIEW OF SYSTEMS:  Denies any active fever, chills, sweats, headache, visual change, sore throat, difficulty swallowing, productive cough, shortness of breath, orthopnea, PND.  Denies any active nausea, vomiting, diarrhea, dysuria, difficulty voiding, joint pain or swelling, skin rash or itching, focal numbness or weakness.  PHYSICAL EXAMINATION:  VITAL SIGNS:  Temperature 99.7, pulse 90, respirations 18, blood pressure 113/65, O2 sat 99% on 2 L. GENERAL:  The patient is awake and alert, in no distress.  She seems a bit confused. SKIN:  Warm and dry. HEENT:  PERRLA, EOMI.  Throat is clear and moist. NECK:  Supple.  Flat neck veins.  No JVD or bruits. CHEST:  Clear throughout.  No rales, rhonchi or wheezing. CARDIAC:  Regular rate and rhythm without murmur, rub, or gallop. ABDOMEN:  Obese, soft, nontender, active bowel sounds.  No masses or hepatosplenomegaly. RECTAL AND GU:  Deferred. EXTREMITIES:  No joint effusion, no deformity.  No ankle edema.  No upper extremity edema. NEUROLOGIC:  No asterixis.  No myoclonus, no focal deficits.  LABORATORY DATA:  Today's sodium 136, potassium 5.5, CO2 of 18, BUN 85, creatinine 3.54, calcium 9.2, magnesium 2.3, phosphorus 6.3.  Hemoglobin A1c is 5.5.  White blood count 6000, hemoglobin 9.4, hematocrit 29%. Platelets are clumped,  appeared adequate.  Cholesterol 152, LDL cholesterol 84.  Urine drug screen positive for opiates.  Alcohol less than 5.  Urinalysis shows 3 to 6 red blood cells, otherwise negative.  IMPRESSION: 1. Acute on chronic renal failure.  Unclear baseline since the last     creatinine was in 2003.  Creatinine is improving with IV fluids and     holding ACE inhibitor and diuretics.  She has a long history of     hypertension and psychiatric illness.  She has a partially atrophic     right kidney at 8.4 cm.  The etiology of her acute renal failure is     most likely volume depletion in conjunction with diuretics and  ACE     inhibitor therapy.  She has not received any IV contrast or other     nephrotoxins, no NSAIDs.  ACE inhibitors have been held     appropriately and she is receiving IV fluids at 150 cc per hour.     We will not make any changes.  We would recommend SPEP and UPEP     with a bland urine to rule out multiple myeloma as cause of her     acute and/or chronic renal failure.  Otherwise, continue fluids and     keep off ACE inhibitor until creatinine is back to baseline.  We     will check urine protein to creatinine ratio and will follow. 2. Hypertension, on lisinopril, Lasix, HCTZ at home. 3. Diabetes mellitus type 2. 4. Obesity. 5. Seizure disorder. 6. Schizophrenia with acute psychotic worsening. 7. Hyperlipidemia.  PLAN:  See orders.     Sol Blazing, M.D.     RDS/MEDQ  D:  08/24/2010  T:  08/24/2010  Job:  [id number]  Electronically Signed by Roney Jaffe M.D. on 09/09/2010 09:10:18 AM

## 2011-06-19 ENCOUNTER — Other Ambulatory Visit (HOSPITAL_COMMUNITY): Payer: Self-pay | Admitting: Pulmonary Disease

## 2011-06-19 DIAGNOSIS — Z1231 Encounter for screening mammogram for malignant neoplasm of breast: Secondary | ICD-10-CM

## 2011-07-28 ENCOUNTER — Ambulatory Visit (HOSPITAL_COMMUNITY)
Admission: RE | Admit: 2011-07-28 | Discharge: 2011-07-28 | Disposition: A | Discharge status: Home / Self Care | Payer: Medicare Other | Source: Ambulatory Visit | Attending: Pulmonary Disease | Admitting: Pulmonary Disease

## 2011-07-28 DIAGNOSIS — Z1231 Encounter for screening mammogram for malignant neoplasm of breast: Secondary | ICD-10-CM | POA: Insufficient documentation

## 2011-07-28 IMAGING — MG MM DIGITAL SCREENING BILAT
5 series · 5 of 5 positions shown · non-contrast
Comparison: none

DG SCREEN MAMMOGRAM BILATERAL
Bilateral CC and MLO view(s) were taken.
Radiologist: SEMIRA, M.D.

BILATERAL DIGITAL SCREENING MAMMOGRAM WITH CAD

[R CC]
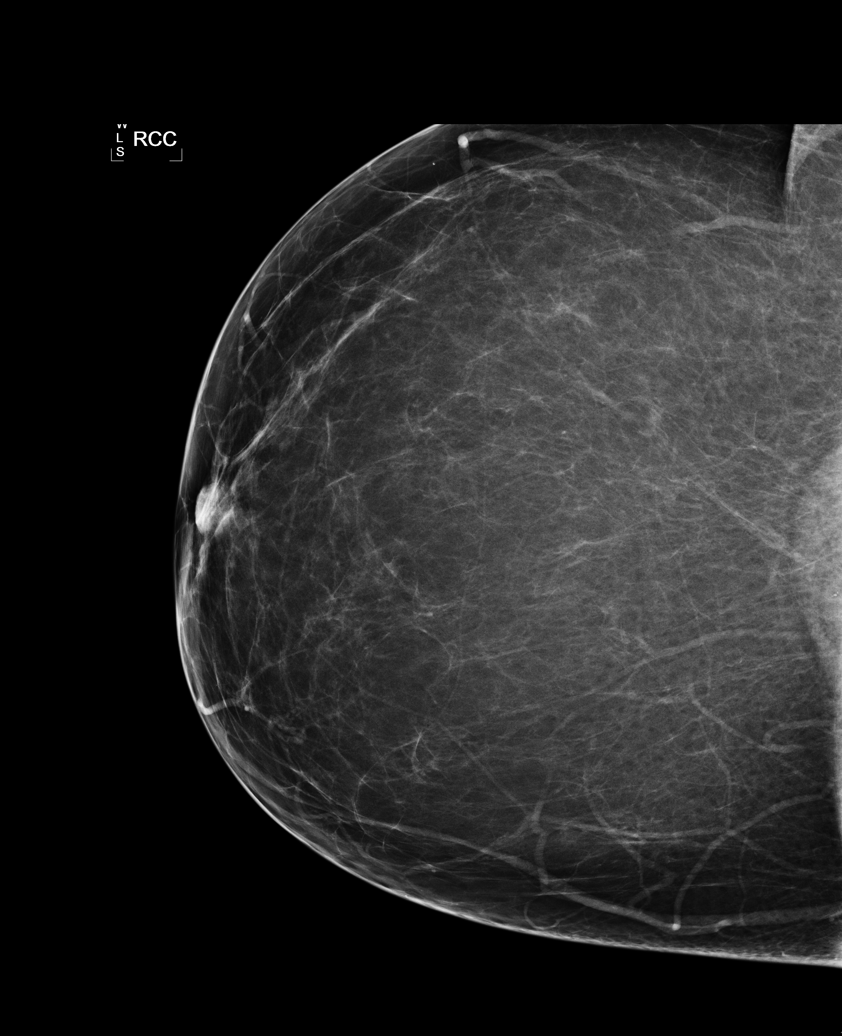

[R MLO]
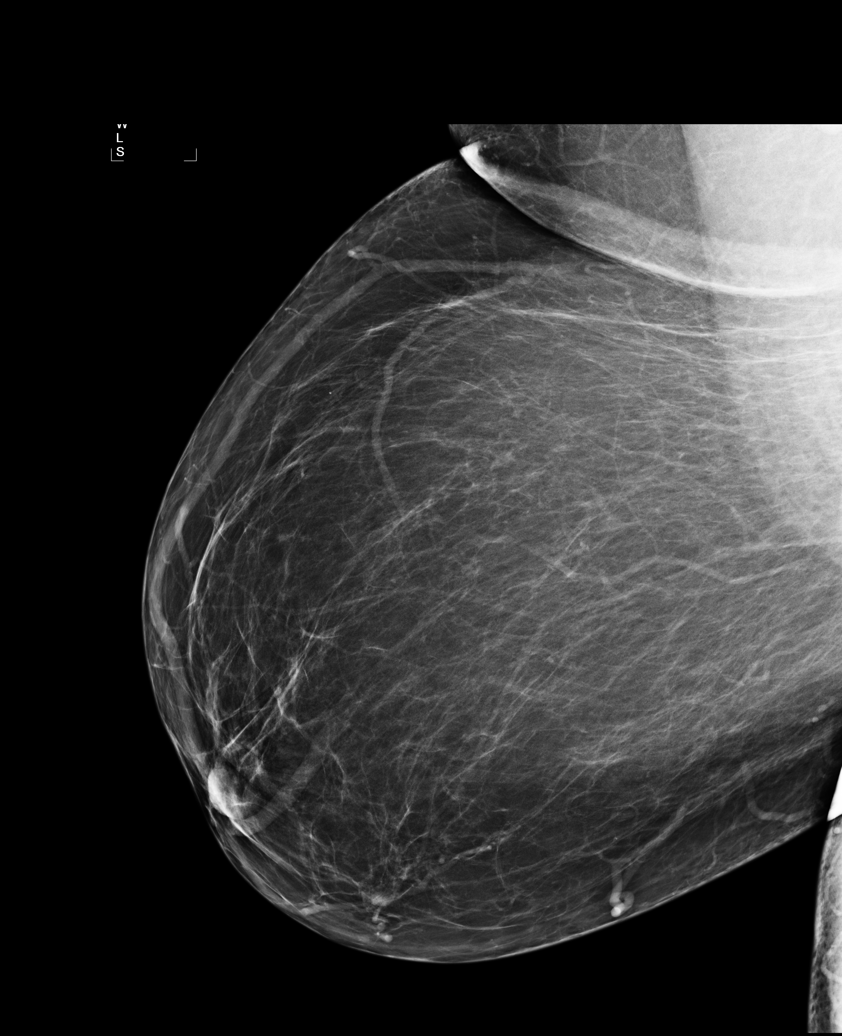

[L CC]
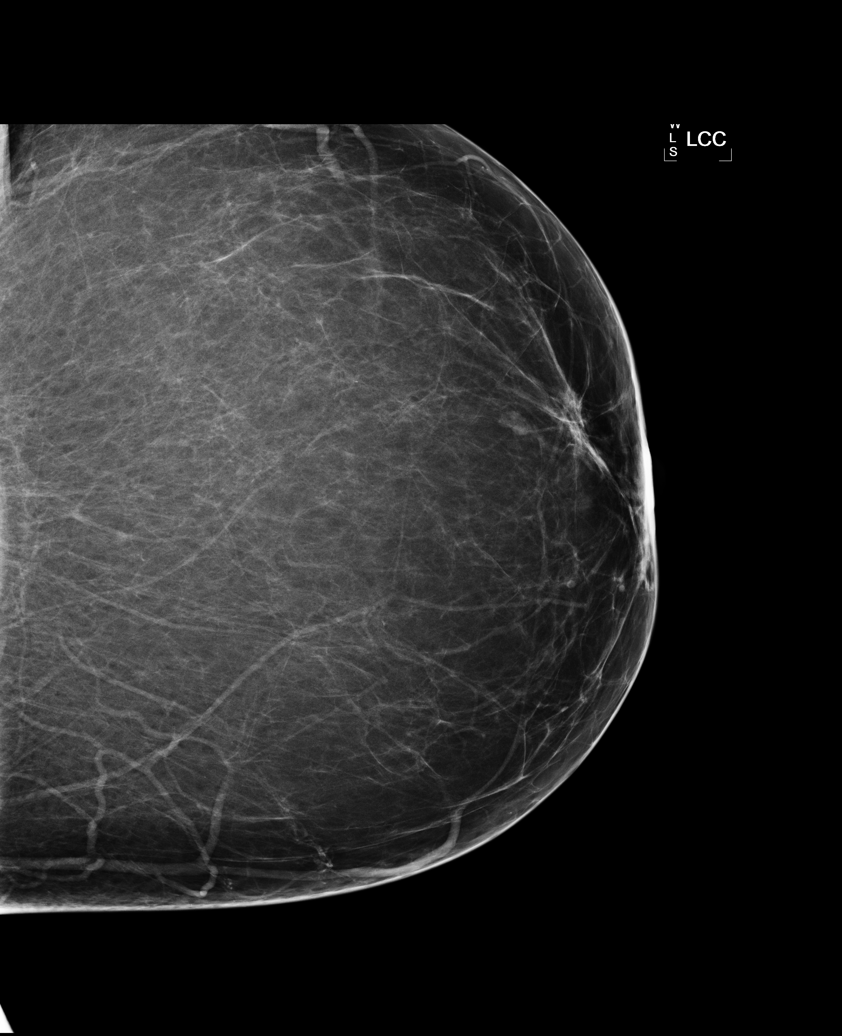

[L MLO (1 of 2)]
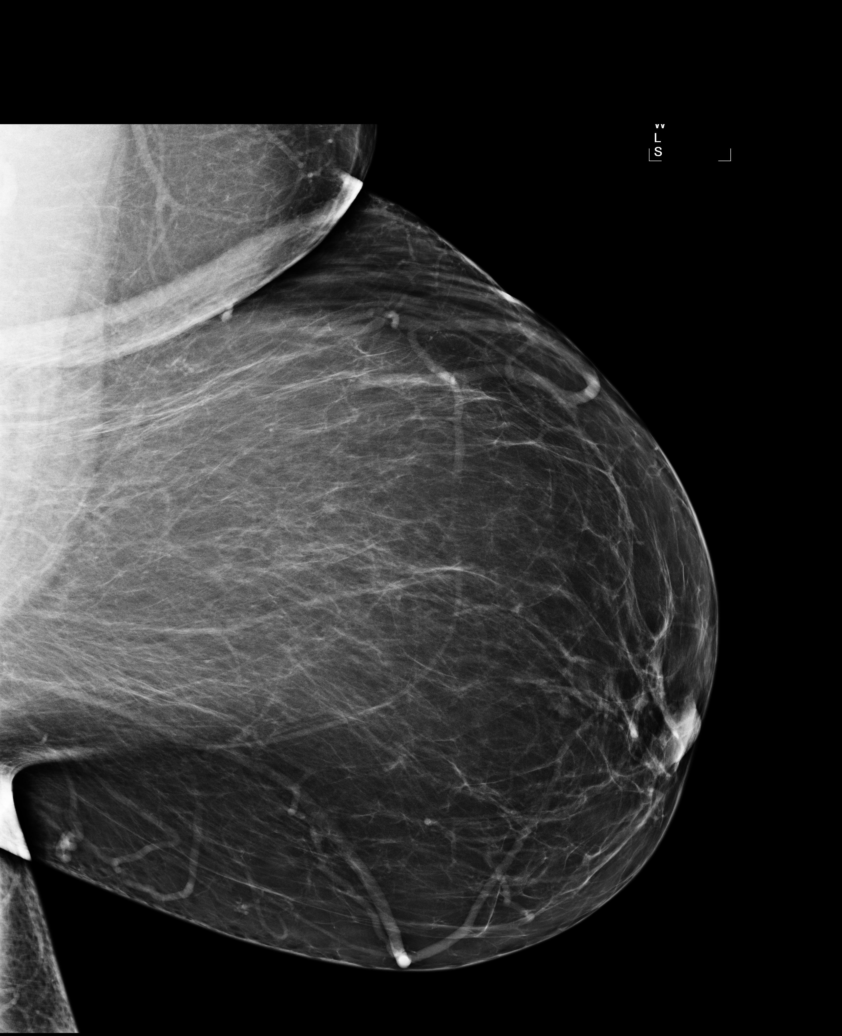

[L MLO (2 of 2)]
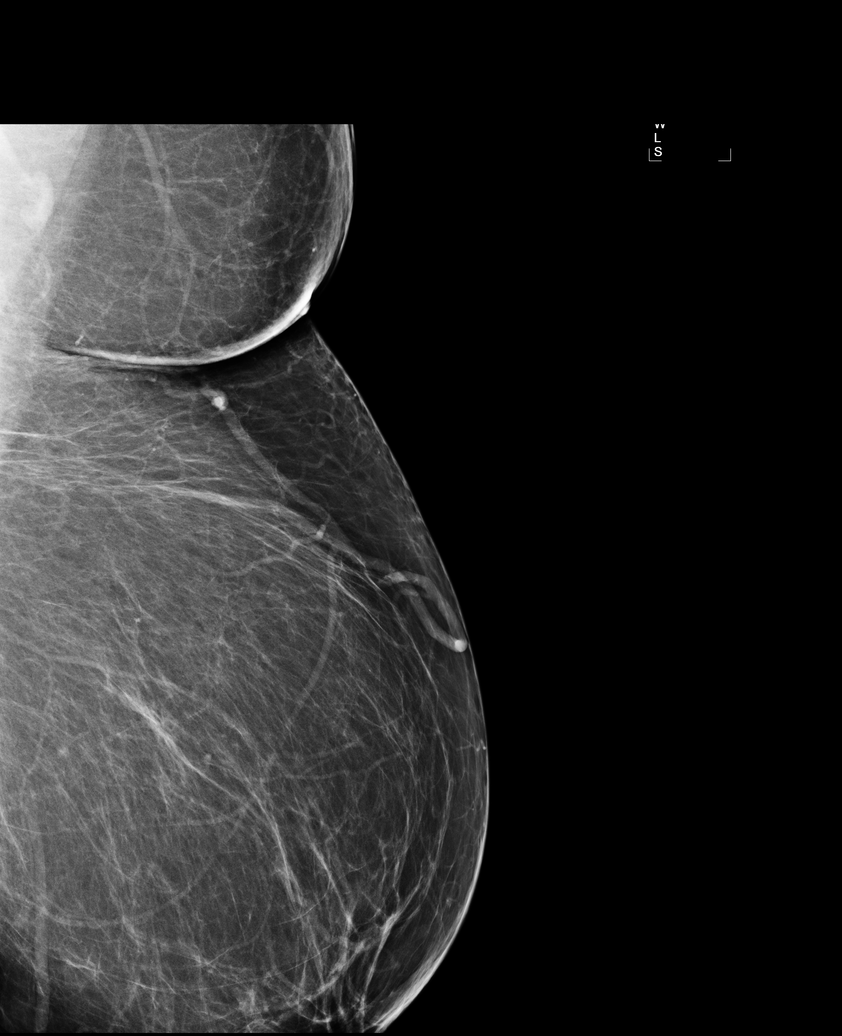

[5 of 5 positions shown; findings below may reference images not displayed]

FINDINGS: Prior films are needed for interpretation.
IMPRESSION: Prior exams will be requested for comparison.  Following comparison with prior studies an addendum 
will be made regarding further recommendations.
Images were processed with CAD.

ASSESSMENT: Negative - BI-RADS 1

Screening mammogram in 1 year.
Addendum

DIGITAL SCREENING MAMMOGRAM WITH CAD:
There are scattered fibroglandular densities.  No masses or malignant type calcifications are 
identified.  Compared with prior studies.

Images were processed with CAD.
IMPRESSION: No specific mammographic evidence of malignancy.  Next screening mammogram is recommended in one 
year.

A result letter of this screening mammogram will be mailed directly to the patient.

Addended by Dr. SEMIRA on [DATE].
,

## 2012-09-07 ENCOUNTER — Other Ambulatory Visit (HOSPITAL_COMMUNITY): Payer: Self-pay | Admitting: Pulmonary Disease

## 2012-09-17 ENCOUNTER — Ambulatory Visit (HOSPITAL_COMMUNITY): Payer: Medicare Other

## 2012-09-20 ENCOUNTER — Ambulatory Visit (HOSPITAL_COMMUNITY)
Admission: RE | Admit: 2012-09-20 | Discharge: 2012-09-20 | Disposition: A | Discharge status: Home / Self Care | Payer: Medicare Other | Source: Ambulatory Visit | Attending: Pulmonary Disease | Admitting: Pulmonary Disease

## 2012-09-20 DIAGNOSIS — Z1231 Encounter for screening mammogram for malignant neoplasm of breast: Secondary | ICD-10-CM | POA: Insufficient documentation

## 2012-09-20 IMAGING — MG MM DIGITAL SCREENING BILAT
4 series · 4 of 4 positions shown · non-contrast
Comparison: Previous exams.

CLINICAL DATA: Screening.

DIGITAL BILATERAL SCREENING MAMMOGRAM WITH CAD

[R CC]
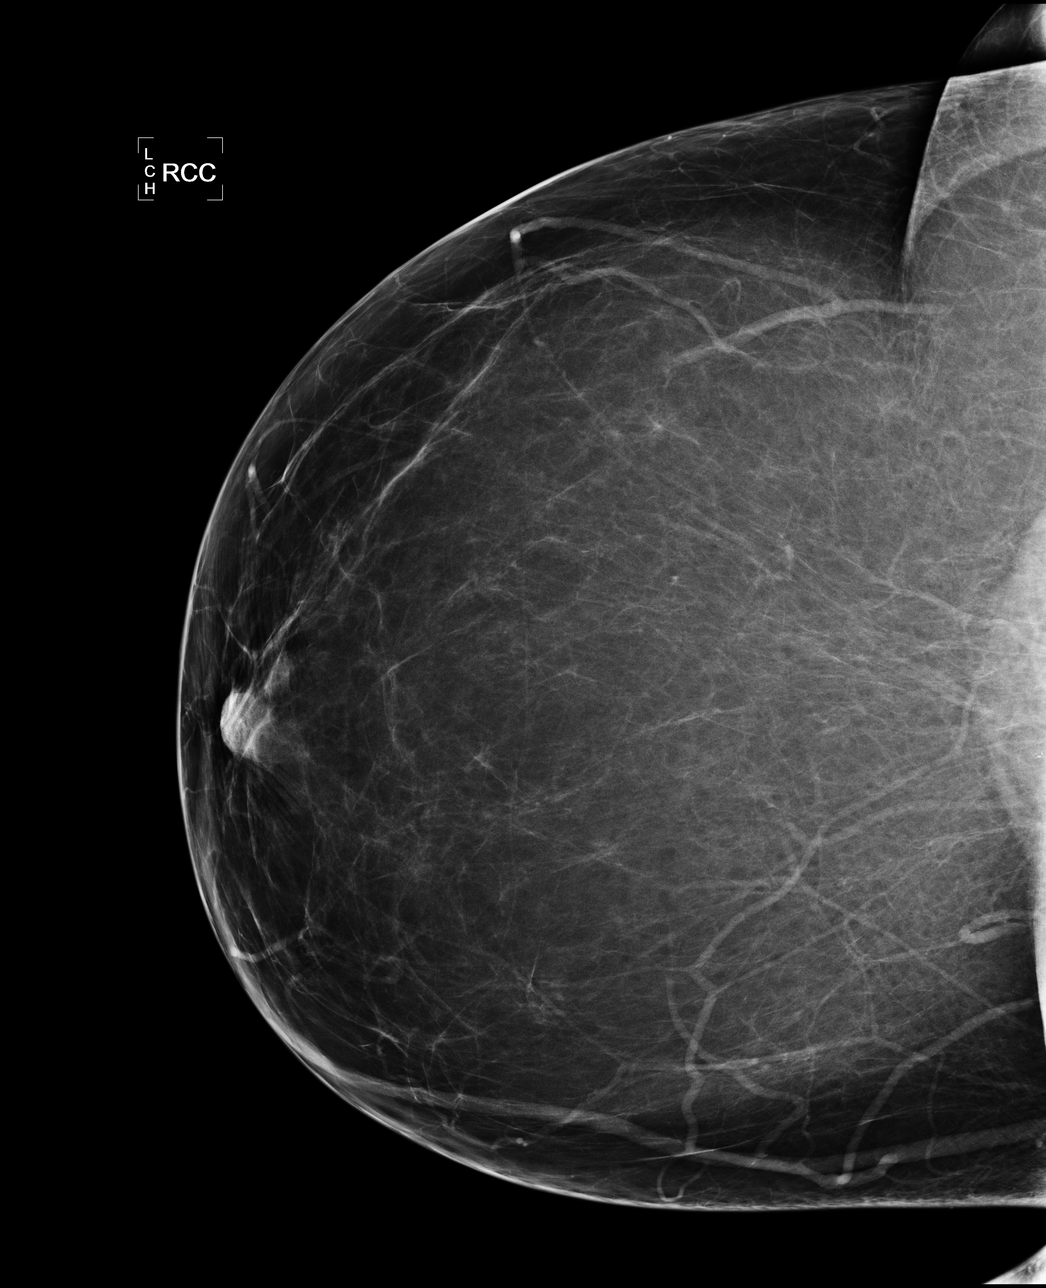

[R MLO]
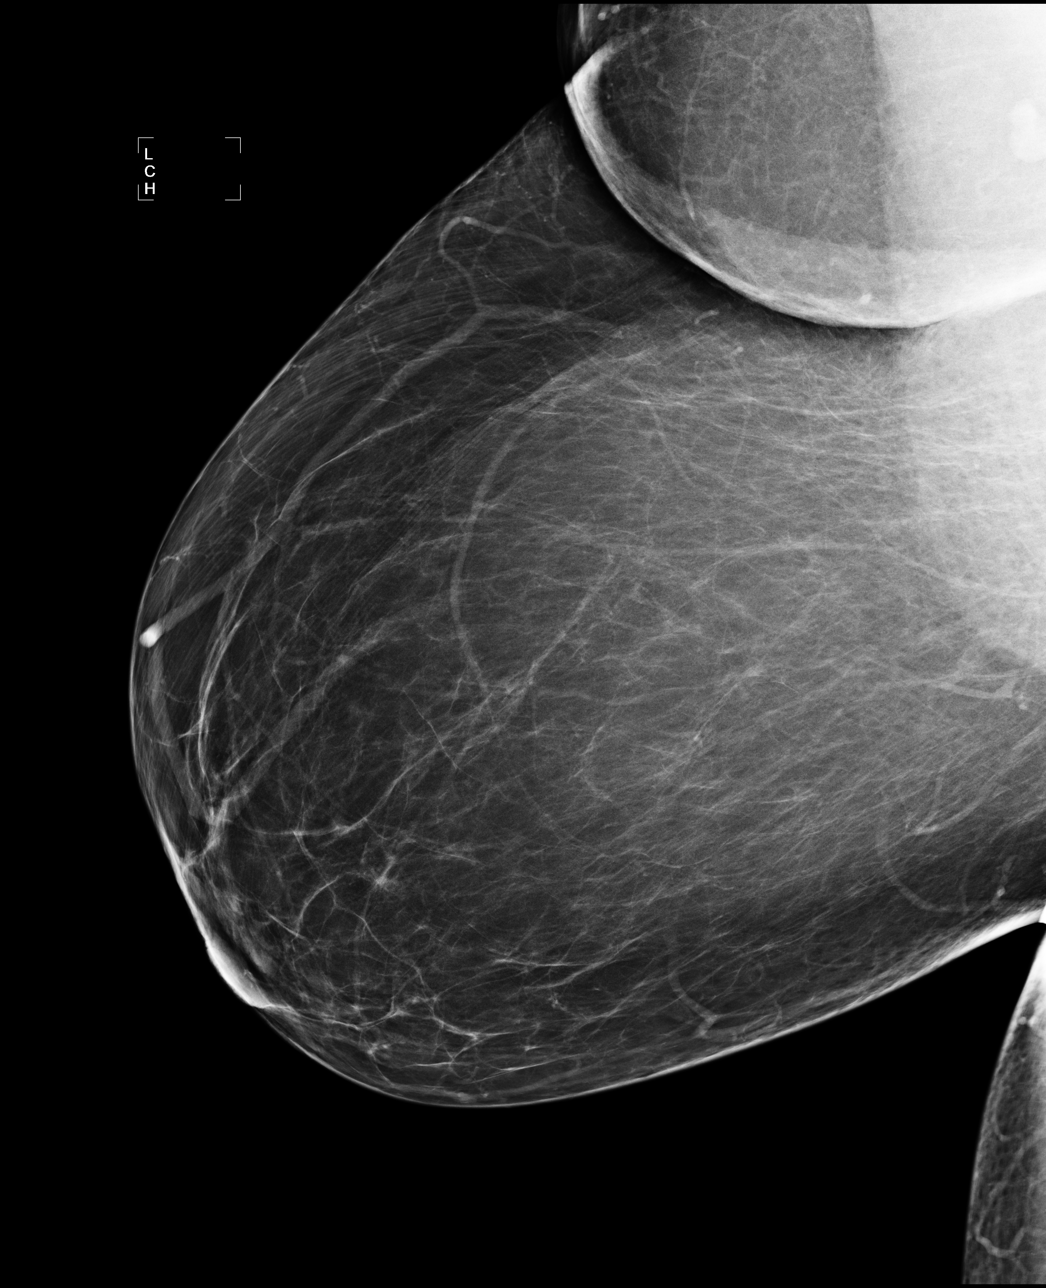

[L CC]
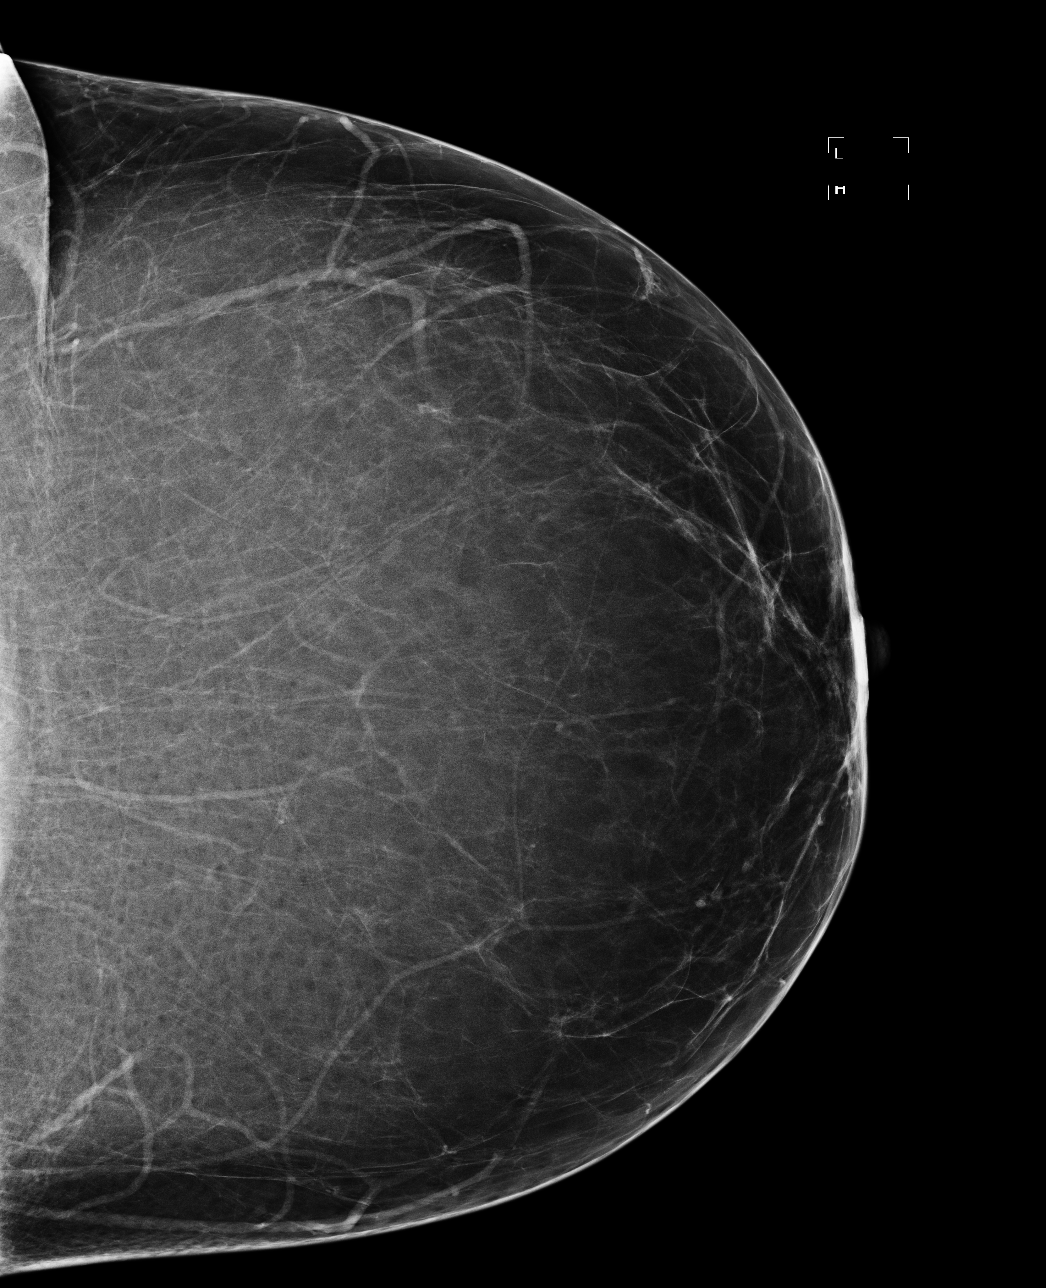

[L MLO]
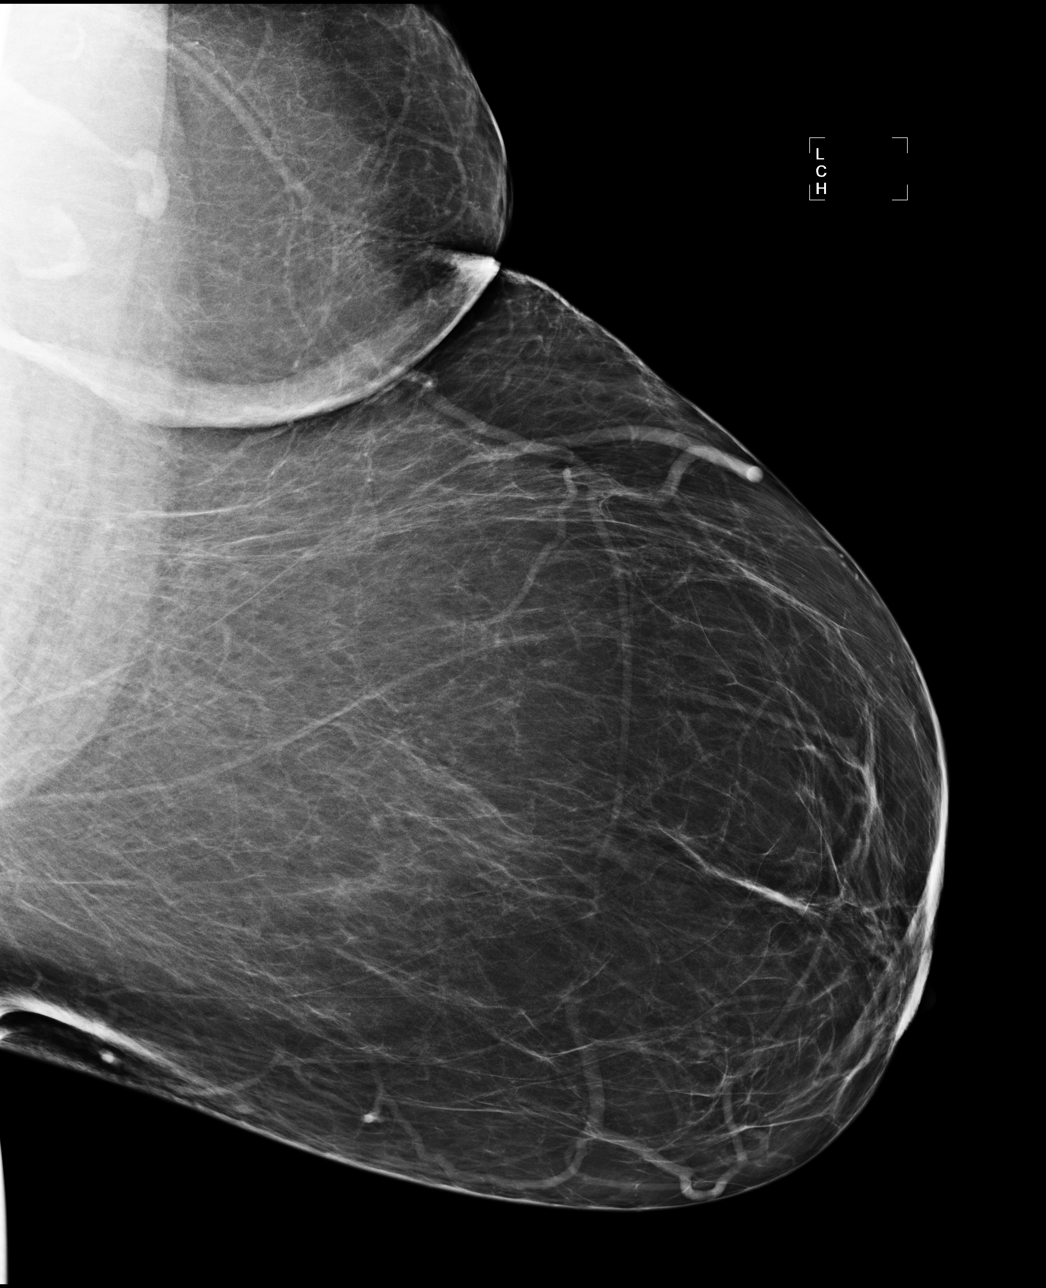

[4 of 4 positions shown; findings below may reference images not displayed]

FINDINGS: ACR Breast Density Category 1: The breast tissue is almost entirely
fatty.

No suspicious masses, architectural distortion, or calcifications
are present.

Images were processed with CAD.
IMPRESSION: No mammographic evidence of malignancy.

A result letter of this screening mammogram will be mailed directly
to the patient.

RECOMMENDATION:
Screening mammogram in one year. (Code:[BJ])

BI-RADS CATEGORY 1:  Negative.

## 2013-04-27 ENCOUNTER — Other Ambulatory Visit (HOSPITAL_COMMUNITY): Payer: Self-pay | Admitting: Psychiatry

## 2013-08-22 ENCOUNTER — Other Ambulatory Visit (HOSPITAL_COMMUNITY): Payer: Self-pay | Admitting: Pulmonary Disease

## 2013-08-22 DIAGNOSIS — Z1231 Encounter for screening mammogram for malignant neoplasm of breast: Secondary | ICD-10-CM

## 2013-09-26 ENCOUNTER — Ambulatory Visit (HOSPITAL_COMMUNITY)
Admission: RE | Admit: 2013-09-26 | Discharge: 2013-09-26 | Disposition: A | Discharge status: Home / Self Care | Payer: Medicare HMO | Source: Ambulatory Visit | Attending: Pulmonary Disease | Admitting: Pulmonary Disease

## 2013-09-26 DIAGNOSIS — Z1231 Encounter for screening mammogram for malignant neoplasm of breast: Secondary | ICD-10-CM | POA: Insufficient documentation

## 2014-04-17 ENCOUNTER — Other Ambulatory Visit (HOSPITAL_COMMUNITY): Payer: Self-pay | Admitting: Pulmonary Disease

## 2014-04-17 DIAGNOSIS — R1010 Upper abdominal pain, unspecified: Secondary | ICD-10-CM

## 2014-04-24 ENCOUNTER — Ambulatory Visit (HOSPITAL_COMMUNITY): Payer: Medicare HMO

## 2014-05-01 ENCOUNTER — Ambulatory Visit (HOSPITAL_COMMUNITY)
Admission: RE | Admit: 2014-05-01 | Discharge: 2014-05-01 | Disposition: A | Discharge status: Home / Self Care | Payer: Medicare HMO | Source: Ambulatory Visit | Attending: Pulmonary Disease | Admitting: Pulmonary Disease

## 2014-05-01 DIAGNOSIS — R1010 Upper abdominal pain, unspecified: Secondary | ICD-10-CM | POA: Diagnosis not present

## 2014-05-01 DIAGNOSIS — N289 Disorder of kidney and ureter, unspecified: Secondary | ICD-10-CM | POA: Diagnosis not present

## 2014-05-01 IMAGING — CT CT ABD-PELV W/O CM
2 of 4 series · 17 of 46 positions shown, 19 images · non-contrast
Comparison: Renal ultrasound [DATE]

CLINICAL DATA: Upper abdominal pain.  Renal insufficiency

EXAM:
CT ABDOMEN AND PELVIS WITHOUT CONTRAST
TECHNIQUE: Multidetector CT imaging of the abdomen and pelvis was performed
following the standard protocol without IV contrast.

[Series 2: abd/ pelvis 5.0 i30f 1 · axial · 0.95mm/px · z∈[-274,+146]mm · 14 of 94 slices shown, 16 images]
[im 5/94  soft-tissue]
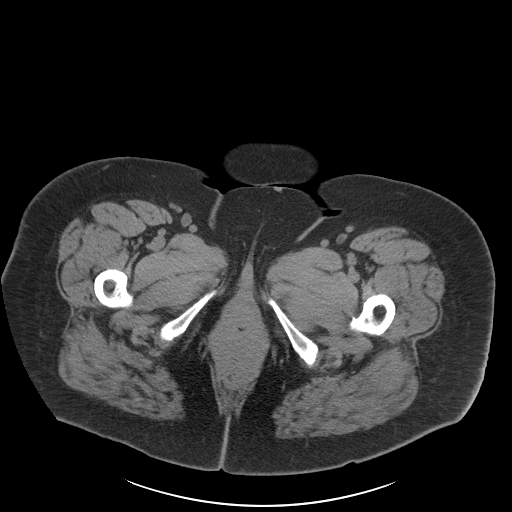
[im 5/94  bone]
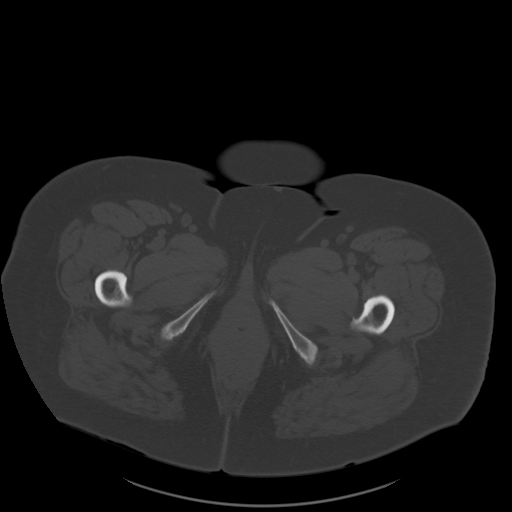
[im 13/94  soft-tissue]
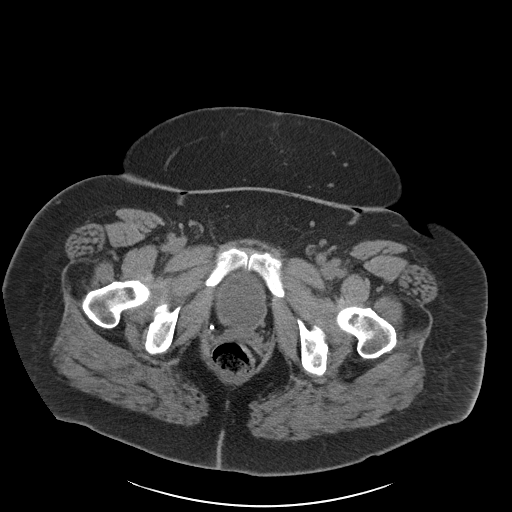
[im 17/94  soft-tissue]
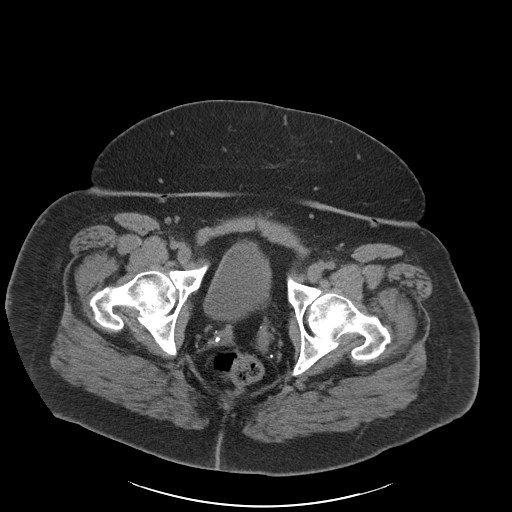
[im 25/94  soft-tissue]
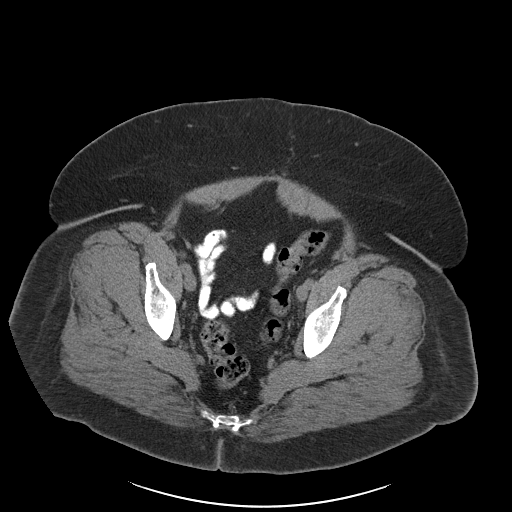
[im 33/94  soft-tissue]
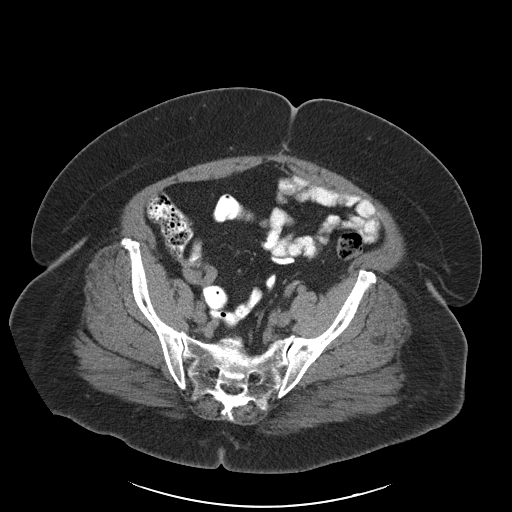
[im 37/94  soft-tissue]
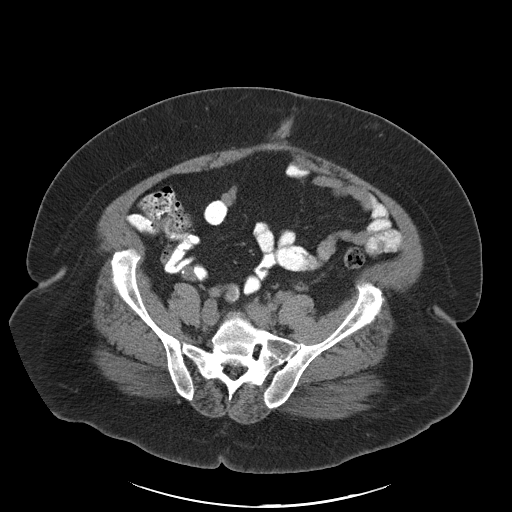
[im 45/94  soft-tissue]
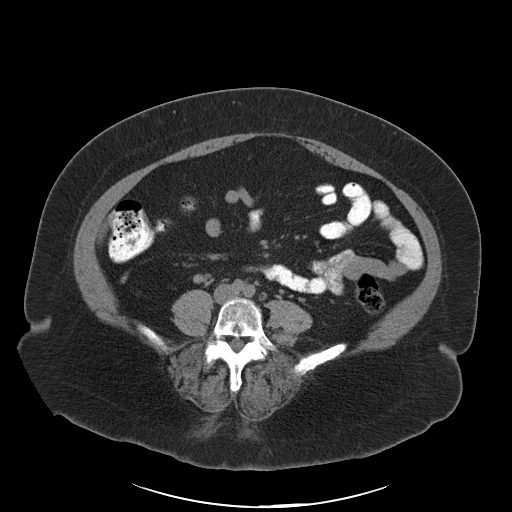
[im 49/94  soft-tissue]
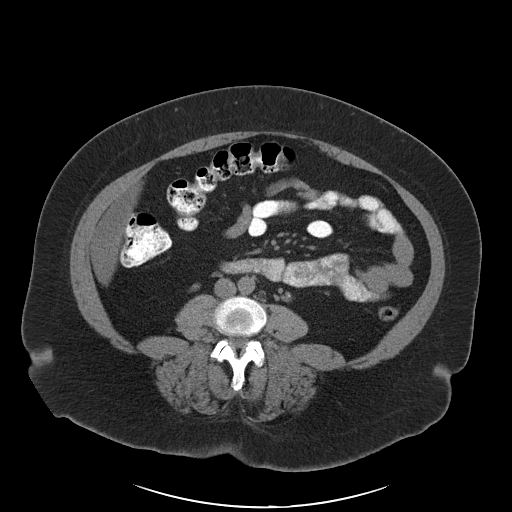
[im 57/94  soft-tissue]
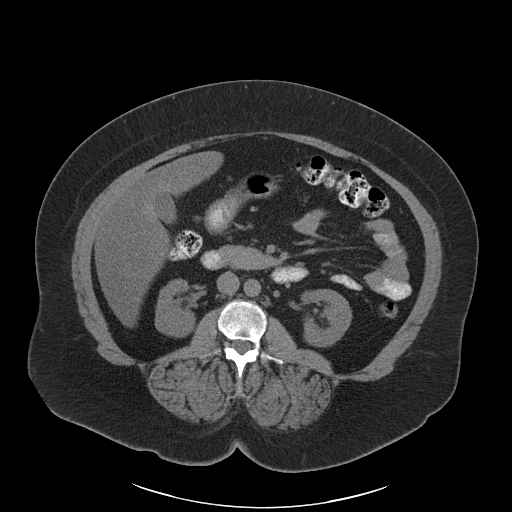
[im 57/94  bone]
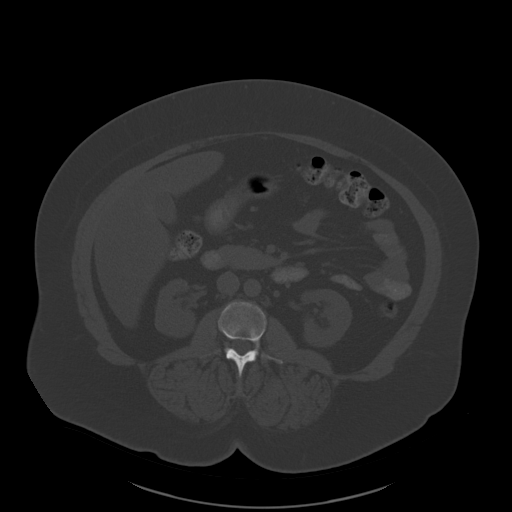
[im 61/94  soft-tissue]
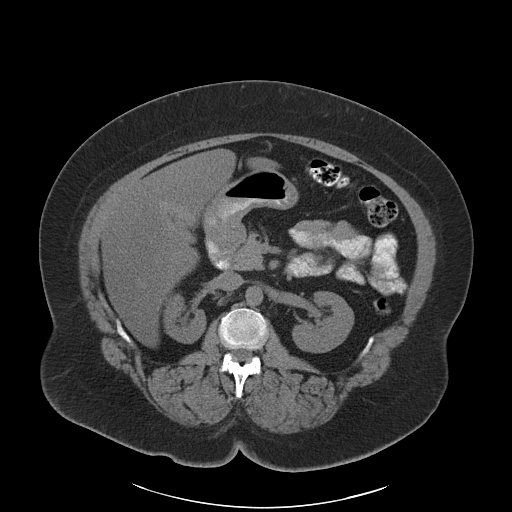
[im 69/94  soft-tissue]
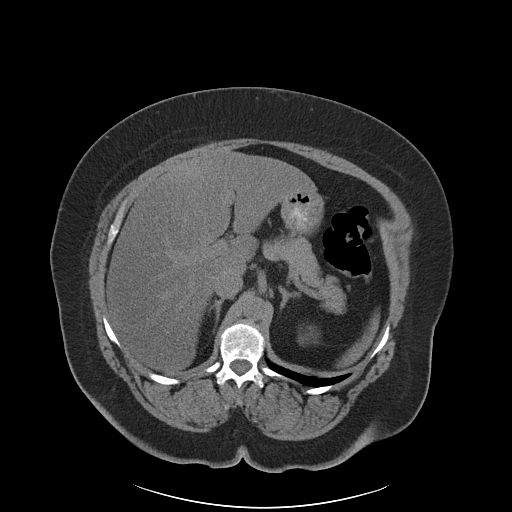
[im 77/94  soft-tissue]
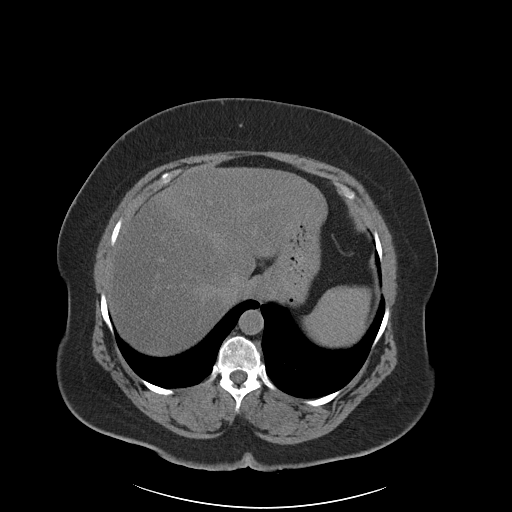
[im 81/94  soft-tissue]
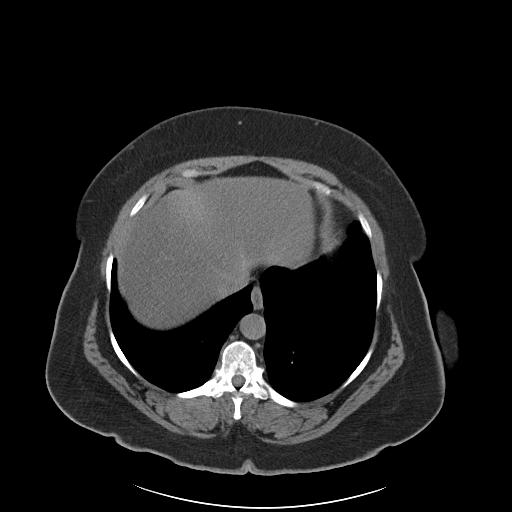
[im 89/94  soft-tissue]
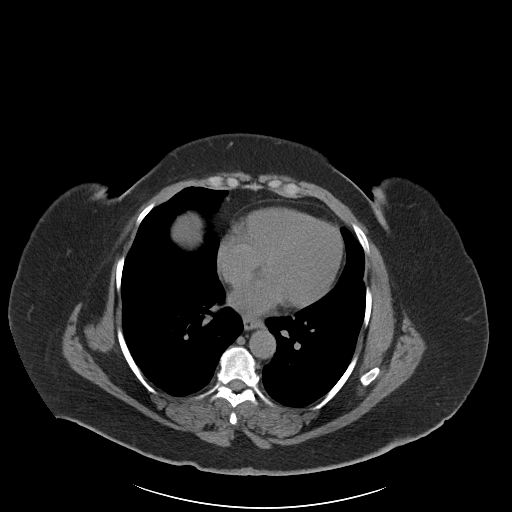

[Series 5: coronals · coronal · 0.92mm/px · 3 of 157 slices shown]
[im 53/157  soft-tissue]
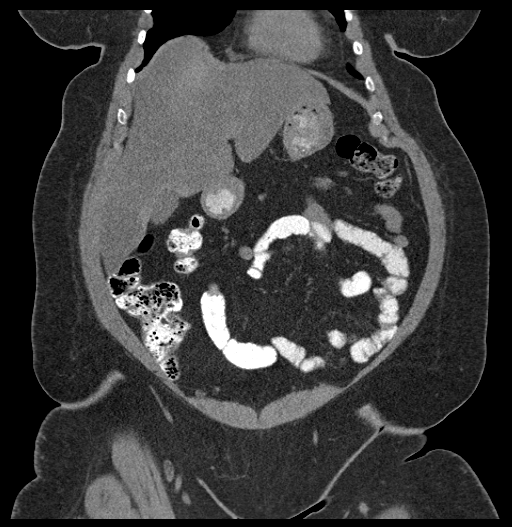
[im 70/157  soft-tissue]
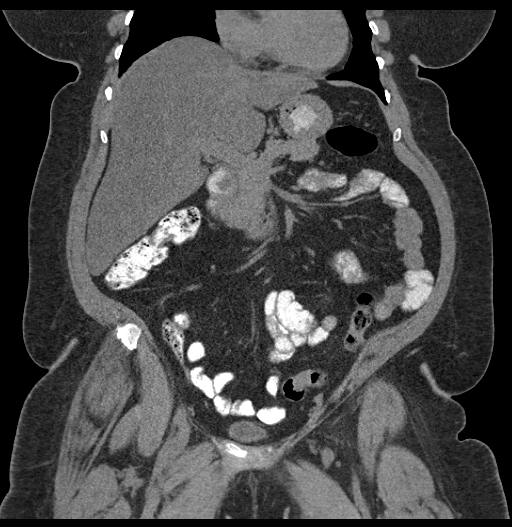
[im 87/157  soft-tissue]
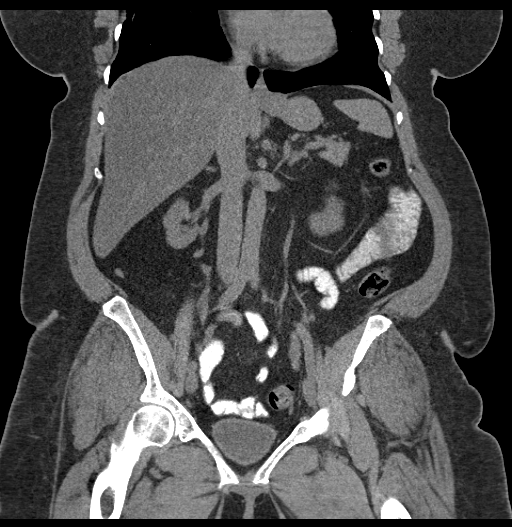

[17 of 46 positions shown; findings below may reference images not displayed]

FINDINGS: Linear scarring the right lung base. No acute abnormality in the
lung bases. Heart size normal

Fatty infiltration of the liver with mild hepatomegaly. Gallbladder
appears normal. Pancreas and spleen are normal.

Atrophic right kidney with mild dilatation the right ureter
suggestive of reflux nephropathy. No obstructing mass or stone
identified. Calcified phlebolith adjacent to the right ureter. Left
kidney negative.

Normal bowel loops.  Normal appendix.  No mass or adenopathy.

No acute spinal abnormality.
IMPRESSION: Fatty infiltration of liver

Atrophic right kidney with dilatation of the right ureter is
suggestive of chronic reflux nephropathy.

## 2014-07-14 NOTE — Telephone Encounter (Signed)
Refill had been denied Oct 2014. Cleaning out MD in basket.

## 2014-09-06 ENCOUNTER — Other Ambulatory Visit (HOSPITAL_COMMUNITY): Payer: Self-pay | Admitting: Pulmonary Disease

## 2014-09-06 DIAGNOSIS — Z1231 Encounter for screening mammogram for malignant neoplasm of breast: Secondary | ICD-10-CM

## 2014-09-28 ENCOUNTER — Ambulatory Visit (HOSPITAL_COMMUNITY)
Admission: RE | Admit: 2014-09-28 | Discharge: 2014-09-28 | Disposition: A | Discharge status: Home / Self Care | Payer: Medicare HMO | Source: Ambulatory Visit | Attending: Pulmonary Disease | Admitting: Pulmonary Disease

## 2014-09-28 DIAGNOSIS — Z1231 Encounter for screening mammogram for malignant neoplasm of breast: Secondary | ICD-10-CM | POA: Diagnosis not present

## 2014-12-25 DIAGNOSIS — E780 Pure hypercholesterolemia: Secondary | ICD-10-CM | POA: Diagnosis not present

## 2014-12-25 DIAGNOSIS — E119 Type 2 diabetes mellitus without complications: Secondary | ICD-10-CM | POA: Diagnosis not present

## 2014-12-25 DIAGNOSIS — F329 Major depressive disorder, single episode, unspecified: Secondary | ICD-10-CM | POA: Diagnosis not present

## 2014-12-25 DIAGNOSIS — F3181 Bipolar II disorder: Secondary | ICD-10-CM | POA: Diagnosis not present

## 2014-12-25 DIAGNOSIS — R251 Tremor, unspecified: Secondary | ICD-10-CM | POA: Diagnosis not present

## 2015-01-05 DIAGNOSIS — G4733 Obstructive sleep apnea (adult) (pediatric): Secondary | ICD-10-CM | POA: Diagnosis not present

## 2015-01-18 DIAGNOSIS — E782 Mixed hyperlipidemia: Secondary | ICD-10-CM | POA: Diagnosis not present

## 2015-01-18 DIAGNOSIS — N184 Chronic kidney disease, stage 4 (severe): Secondary | ICD-10-CM | POA: Diagnosis not present

## 2015-01-18 DIAGNOSIS — Z0000 Encounter for general adult medical examination without abnormal findings: Secondary | ICD-10-CM | POA: Diagnosis not present

## 2015-01-18 DIAGNOSIS — R251 Tremor, unspecified: Secondary | ICD-10-CM | POA: Diagnosis not present

## 2015-01-18 DIAGNOSIS — E119 Type 2 diabetes mellitus without complications: Secondary | ICD-10-CM | POA: Diagnosis not present

## 2015-01-18 DIAGNOSIS — Z23 Encounter for immunization: Secondary | ICD-10-CM | POA: Diagnosis not present

## 2015-02-04 DIAGNOSIS — G4733 Obstructive sleep apnea (adult) (pediatric): Secondary | ICD-10-CM | POA: Diagnosis not present

## 2015-02-05 DIAGNOSIS — F39 Unspecified mood [affective] disorder: Secondary | ICD-10-CM | POA: Diagnosis not present

## 2015-03-01 DIAGNOSIS — L638 Other alopecia areata: Secondary | ICD-10-CM | POA: Diagnosis not present

## 2015-03-06 DIAGNOSIS — F39 Unspecified mood [affective] disorder: Secondary | ICD-10-CM | POA: Diagnosis not present

## 2015-03-07 DIAGNOSIS — G4733 Obstructive sleep apnea (adult) (pediatric): Secondary | ICD-10-CM | POA: Diagnosis not present

## 2015-03-07 DIAGNOSIS — Z79899 Other long term (current) drug therapy: Secondary | ICD-10-CM | POA: Diagnosis not present

## 2015-03-26 DIAGNOSIS — F250 Schizoaffective disorder, bipolar type: Secondary | ICD-10-CM | POA: Diagnosis not present

## 2015-04-07 DIAGNOSIS — G4733 Obstructive sleep apnea (adult) (pediatric): Secondary | ICD-10-CM | POA: Diagnosis not present

## 2015-04-17 DIAGNOSIS — F250 Schizoaffective disorder, bipolar type: Secondary | ICD-10-CM | POA: Diagnosis not present

## 2015-04-19 DIAGNOSIS — E1122 Type 2 diabetes mellitus with diabetic chronic kidney disease: Secondary | ICD-10-CM | POA: Diagnosis not present

## 2015-04-19 DIAGNOSIS — N184 Chronic kidney disease, stage 4 (severe): Secondary | ICD-10-CM | POA: Diagnosis not present

## 2015-04-19 DIAGNOSIS — E782 Mixed hyperlipidemia: Secondary | ICD-10-CM | POA: Diagnosis not present

## 2015-04-19 DIAGNOSIS — E119 Type 2 diabetes mellitus without complications: Secondary | ICD-10-CM | POA: Diagnosis not present

## 2015-04-19 DIAGNOSIS — N08 Glomerular disorders in diseases classified elsewhere: Secondary | ICD-10-CM | POA: Diagnosis not present

## 2015-04-19 DIAGNOSIS — I10 Essential (primary) hypertension: Secondary | ICD-10-CM | POA: Diagnosis not present

## 2015-04-19 DIAGNOSIS — E7800 Pure hypercholesterolemia, unspecified: Secondary | ICD-10-CM | POA: Diagnosis not present

## 2015-04-19 DIAGNOSIS — I129 Hypertensive chronic kidney disease with stage 1 through stage 4 chronic kidney disease, or unspecified chronic kidney disease: Secondary | ICD-10-CM | POA: Diagnosis not present

## 2015-04-27 ENCOUNTER — Other Ambulatory Visit (HOSPITAL_COMMUNITY): Payer: Self-pay | Admitting: Psychiatry

## 2015-04-30 DIAGNOSIS — F250 Schizoaffective disorder, bipolar type: Secondary | ICD-10-CM | POA: Diagnosis not present

## 2015-05-07 DIAGNOSIS — G4733 Obstructive sleep apnea (adult) (pediatric): Secondary | ICD-10-CM | POA: Diagnosis not present

## 2015-05-08 ENCOUNTER — Ambulatory Visit (INDEPENDENT_AMBULATORY_CARE_PROVIDER_SITE_OTHER): Payer: Commercial Managed Care - HMO | Admitting: Neurology

## 2015-05-08 ENCOUNTER — Encounter: Payer: Self-pay | Admitting: Neurology

## 2015-05-08 VITALS — BP 135/77 | HR 85 | Ht 64.0 in | Wt 270.6 lb

## 2015-05-08 DIAGNOSIS — F3162 Bipolar disorder, current episode mixed, moderate: Secondary | ICD-10-CM | POA: Diagnosis not present

## 2015-05-08 DIAGNOSIS — R251 Tremor, unspecified: Secondary | ICD-10-CM | POA: Diagnosis not present

## 2015-05-08 DIAGNOSIS — F411 Generalized anxiety disorder: Secondary | ICD-10-CM | POA: Insufficient documentation

## 2015-05-08 DIAGNOSIS — G251 Drug-induced tremor: Secondary | ICD-10-CM | POA: Diagnosis not present

## 2015-05-08 MED ORDER — CLONAZEPAM 0.5 MG PO TABS: 0.5000 mg | ORAL_TABLET | Freq: Two times a day (BID) | ORAL | Status: DC

## 2015-05-08 NOTE — Patient Instructions (Signed)
I had a long discussion with the patient with regards to her long-standing mild upper extremity action tremor which likely is due to combination of anxiety and medication effect from Depakote. I recommend she increase Klonopin to twice daily and check valproic acid level, ammonia level and liver function tests. I do not believe repeat brain imaging is necessary as she had an MRI scan done in 2013 for the same problem. I advised her about compensation strategies for tremors. She was advised to return for follow-up in 2 months or call earlier if necessary.  Compensation Strategies for Tremors  When eating, try the following  Eat out of bowls, divided plates, or use a plate guard (available at a medical supply store) and eat with a spoon so that you have an edge to scoop up food.  Try raising your plate so that there is less distance between the plate and mouth.Try stabilizing elbows on the tables or against your body.  Use utensil with built-up/larger grips as they are easier to hold.  When writing, try the following:  Stabilize forearm on the table.  Take your time as rushing/being stressed can increase tremors.  Try a felt-tipped pen, it does not glide as much.  Avoid gel pens ( they move to much ).  Consider using pre-printed labels with your name and address (carry them with you when you go out) or you can get stamps with your address or signature on it.  Use a small tape recorder to record messages/reminders for yourself.  Use pens with bigger grips.  When brushing your teeth, putting on make-up, or styling hair, try the following:  Use an electric toothbrush.  Use items with built-up grips.  Stabilize your elbows against your body or on the counter.  Use long-handled brushes/combs.  Use a hair dryer with a stand.  In general:  Avoid stress, fatigue or rushing as this can increase tremors.  Sit down for activities that require more control/coordination.  Perform  "flicks".

## 2015-05-08 NOTE — Progress Notes (Signed)
[facility] [address] [place]. [place] [address] [phone]       OFFICE CONSULT NOTE  [name] Date of Birth:  1950-12-09 Medical Record Number:  [id number]   Referring MD: Kimberly Crocker, NP Reason for Referral:  Tremor HPI: Kimberly Gross is a 64 year old African-American lady with history of diabetes, hypertension, bipolar disorder, anxiety, hyperlipidemia and iron deficiency anemia whose had long-standing bilateral upper extremity tremors for more than 3 years. The tremor is present mainly with activities like holding a cup of coffee, using her hands and is absent at rest. In the last few months she does notice slight worsening of the tremor was spilling hot coffee and having trouble eating. She has good days and bad days but is not able to identify specific triggers. The tremor seems more prominent in the morning. She denies drinking alcohol and does not drink more than one cup of coffee every day. Is no family history of tremor. She does have long-standing history of chronic anxiety disorder and does take Klonopin 0.5 mg daily. She also has history of bipolar disorder and takes Depakote and in the past was taking Geodon. She was in fact seen 3 years ago by Dr. Corwin Gross for the same problem and  I have reviewed all his notes he felt the tremors were related to medication effect. Tremors were not interfering with her day-to-day activity and hence conservative follow-up is recommended. MRI scan of the brain was obtained and 2013 which showed minimum changes of small vessel disease. Patient is currently under treatment from psychiatrist Dr. Casimiro Gross who has recently reduced the dose of Depakote from 500 mg 4 times daily to now twice daily. Patient cannot tell me with certainty whether the tremor is any significantly improved. She denies any drooling of saliva, bradykinesia, difficulty getting out of a chair or bed, difficulty with walking, stooped posture or balance  problems. There is no family history of similar tremors. She had recent lab work done on 04/19/15 which showed elevated hemoglobin A1c of 13.6. Total cholesterol 206, HDL 39, LDL 88 mg percent. Electrolytes are normal. TSH was normal at 1.38.  ROS:   14 system review of systems is positive for  tremors only and all other systems negative. PMH:  Past Medical History  Diagnosis Date  . Diabetes mellitus without complication ([place])   . Hypertension   . Hyperlipemia   . Anxiety   . Depression   . Anemia   . Bipolar 1 disorder ([place])     Social History:  Social History   Social History  . Marital Status: Married    Spouse Name: N/A  . Number of Children: N/A  . Years of Education: N/A   Occupational History  . Not on file.   Social History Main Topics  . Smoking status: Never Smoker   . Smokeless tobacco: Not on file  . Alcohol Use: Not on file  . Drug Use: Not on file  . Sexual Activity: Not on file   Other Topics Concern  . Not on file   Social History Narrative  . No narrative on file    Medications:   No current outpatient prescriptions on file prior to visit.   No current facility-administered medications on file prior to visit.    Allergies:   Allergies  Allergen Reactions  . Chlorpromazine Rash    Physical Exam General: Obese middle-aged African-American lady, seated, in no evident distress Head: head normocephalic and atraumatic.   Neck: supple  with no carotid or supraclavicular bruits Cardiovascular: regular rate and rhythm, no murmurs Musculoskeletal: no deformity Skin:  no rash/petichiae Vascular:  Normal pulses all extremities  Neurologic Exam Mental Status: Awake and fully alert. Oriented to place and time. Recent and remote memory intact. Attention span, concentration and fund of knowledge appropriate. Mood and affect appropriate.  Cranial Nerves: Fundoscopic exam reveals sharp disc margins. Pupils equal, briskly reactive to light. Extraocular  movements full without nystagmus. Visual fields full to confrontation. Hearing intact. Facial sensation intact. Face, tongue, palate moves normally and symmetrically.  Motor: Normal bulk and tone. Normal strength in all tested extremity muscles. Mild fine action tremor of outstretched upper extremities right greater than left. Tremor is absent at rest. No pill-rolling quality. No cogwheel rigidity, bradykinesia. Able to get up from the chair with arms folded across her chest easily. Sensory.: intact to touch , pinprick , position and vibratory sensation.  Coordination: Rapid alternating movements normal in all extremities. Finger-to-nose and heel-to-shin performed accurately bilaterally. Gait and Station: Arises from chair without difficulty. Stance is normal. Gait demonstrates normal stride length and balance . Able to heel, toe and tandem walk without difficulty. No festination, stooped posture or retropulsion. Reflexes: 1+ and symmetric. Toes downgoing.       ASSESSMENT: 64 year African-American lady with 3 year history of mild upper extremity action tremors likely mixed anxiety and medication effect of Depakote.  Clinical history and exam is not suggestive of essential tremor or Parkinson's.    PLAN: I had a long discussion with the patient with regards to her long-standing mild upper extremity action tremor which likely is due to combination of anxiety and medication effect from Depakote. I recommend she increase Klonopin to twice daily and check valproic acid level, ammonia level and liver function tests. I do not believe repeat brain imaging is necessary as she had an MRI scan done in 2013 for the same problem. I advised her about compensation strategies for tremors. Greater than 50% time during this 45 minute consultation was spent on counseling and coordination of care. She was advised to return for follow-up in 2 months or call earlier if necessary.  Kimberly Contras, MD  Note: This  document was prepared with digital dictation and possible smart phrase technology. Any transcriptional errors that result from this process are unintentional.

## 2015-05-09 LAB — HEPATIC FUNCTION PANEL
ALBUMIN: 4 g/dL (ref 3.6–4.8)
ALK PHOS: 105 IU/L (ref 39–117)
ALT: 16 IU/L (ref 0–32)
AST: 18 IU/L (ref 0–40)
BILIRUBIN TOTAL: 0.3 mg/dL (ref 0.0–1.2)
BILIRUBIN, DIRECT: 0.12 mg/dL (ref 0.00–0.40)
TOTAL PROTEIN: 6.4 g/dL (ref 6.0–8.5)

## 2015-05-09 LAB — VALPROIC ACID LEVEL: Valproic Acid Lvl: 92 ug/mL (ref 50–100)

## 2015-05-09 LAB — AMMONIA: Ammonia: 31 ug/dL (ref 19–87)

## 2015-05-15 ENCOUNTER — Telehealth: Payer: Self-pay

## 2015-05-15 NOTE — Telephone Encounter (Signed)
Rn notified patient that her valporic acid(depakote level is normal). Pt verbalized understanding of the lab result.

## 2015-05-15 NOTE — Telephone Encounter (Signed)
-----   Message from Garvin Fila, MD sent at 05/10/2015  8:15 PM EDT ----- Kimberly Gross inform patient that valproic acid ( depakote) level is normal

## 2015-05-18 DIAGNOSIS — L669 Cicatricial alopecia, unspecified: Secondary | ICD-10-CM | POA: Diagnosis not present

## 2015-05-29 DIAGNOSIS — F250 Schizoaffective disorder, bipolar type: Secondary | ICD-10-CM | POA: Diagnosis not present

## 2015-06-07 DIAGNOSIS — G4733 Obstructive sleep apnea (adult) (pediatric): Secondary | ICD-10-CM | POA: Diagnosis not present

## 2015-06-27 DIAGNOSIS — Z0000 Encounter for general adult medical examination without abnormal findings: Secondary | ICD-10-CM | POA: Diagnosis not present

## 2015-06-27 DIAGNOSIS — R7309 Other abnormal glucose: Secondary | ICD-10-CM | POA: Diagnosis not present

## 2015-06-27 DIAGNOSIS — I10 Essential (primary) hypertension: Secondary | ICD-10-CM | POA: Diagnosis not present

## 2015-06-27 DIAGNOSIS — J449 Chronic obstructive pulmonary disease, unspecified: Secondary | ICD-10-CM | POA: Diagnosis not present

## 2015-06-27 DIAGNOSIS — E784 Other hyperlipidemia: Secondary | ICD-10-CM | POA: Diagnosis not present

## 2015-06-27 DIAGNOSIS — Z6841 Body Mass Index (BMI) 40.0 and over, adult: Secondary | ICD-10-CM | POA: Diagnosis not present

## 2015-07-07 DIAGNOSIS — G4733 Obstructive sleep apnea (adult) (pediatric): Secondary | ICD-10-CM | POA: Diagnosis not present

## 2015-07-20 ENCOUNTER — Encounter: Payer: Self-pay | Admitting: Neurology

## 2015-07-20 ENCOUNTER — Ambulatory Visit (INDEPENDENT_AMBULATORY_CARE_PROVIDER_SITE_OTHER): Payer: PPO | Admitting: Neurology

## 2015-07-20 VITALS — BP 149/93 | HR 95 | Ht 65.0 in | Wt 281.2 lb

## 2015-07-20 DIAGNOSIS — R251 Tremor, unspecified: Secondary | ICD-10-CM | POA: Diagnosis not present

## 2015-07-20 NOTE — Patient Instructions (Signed)
I had a long discussion with the patient with regards to her mild action tremors which are likely due to combination of underlying anxiety as well as medication effect of Depakote. I recommend she continue Klonopin and in fact discuss possible increase in dosing frequency with her psychologist Dr. Casimiro Needle. No further neurological workup is necessary at this time. She was advised to return for follow-up in the future only if necessary and no routine follow-up appointment was made.

## 2015-07-20 NOTE — Progress Notes (Signed)
[facility] [address] [place]. [place] [address] [phone]       OFFICE FOLLOW UP VISIT NOTE  Ms. SPRUHA WEIGHT Date of Birth:  Dec 20, 1950 Medical Record Number:  [id number]   Referring MD: Darleen Crocker, NP Reason for Referral:  Tremor HPI: Mrs. Avans is a 65 year old African-American lady with history of diabetes, hypertension, bipolar disorder, anxiety, hyperlipidemia and iron deficiency anemia whose had long-standing bilateral upper extremity tremors for more than 3 years. The tremor is present mainly with activities like holding a cup of coffee, using her hands and is absent at rest. In the last few months she does notice slight worsening of the tremor was spilling hot coffee and having trouble eating. She has good days and bad days but is not able to identify specific triggers. The tremor seems more prominent in the morning. She denies drinking alcohol and does not drink more than one cup of coffee every day. Is no family history of tremor. She does have long-standing history of chronic anxiety disorder and does take Klonopin 0.5 mg daily. She also has history of bipolar disorder and takes Depakote and in the past was taking Geodon. She was in fact seen 3 years ago by Dr. Corwin Levins for the same problem and  I have reviewed all his notes he felt the tremors were related to medication effect. Tremors were not interfering with her day-to-day activity and hence conservative follow-up is recommended. MRI scan of the brain was obtained and 2013 which showed minimum changes of small vessel disease. Patient is currently under treatment from psychiatrist Dr. Casimiro Needle who has recently reduced the dose of Depakote from 500 mg 4 times daily to now twice daily. Patient cannot tell me with certainty whether the tremor is any significantly improved. She denies any drooling of saliva, bradykinesia, difficulty getting out of a chair or bed, difficulty with walking, stooped posture or  balance problems. There is no family history of similar tremors. She had recent lab work done on 04/19/15 which showed elevated hemoglobin A1c of 13.6. Total cholesterol 206, HDL 39, LDL 88 mg percent. Electrolytes are normal. TSH was normal at 1.38. Update 07/20/2015 : She returns for follow-up after initial consultation 2 months ago. She reports that her tremors are about the same. She notices a tremor mainly in the evening time. The tremors are still mild and do not significantly interfere with activities of daily living. Tremors are also more noticeable when she is tired. She has not increased the Klonopin does to twice daily as I had instructed but wants to discuss this with her psychologist Dr. Casimiro Needle  and she has an appointment in a few weeks. She had lab work done at last visit on 05/08/15 which I have reviewed and valproic acid level was normal at 92, ammonia at 31 and liver function tests were also normal as well. She has no new complaints today. She denies any significant difficulty getting out of a chair or bed, mobility, balance, drooling of saliva or falls. ROS:   14 system review of systems is positive for  tremors only and all other systems negative. PMH:  Past Medical History  Diagnosis Date  . Diabetes mellitus without complication ([place])   . Hypertension   . Hyperlipemia   . Anxiety   . Depression   . Anemia   . Bipolar 1 disorder ([place])   . Occasional tremors     Social History:  Social History   Social History  . Marital Status:  Married    Spouse Name: N/A  . Number of Children: N/A  . Years of Education: N/A   Occupational History  . Not on file.   Social History Main Topics  . Smoking status: Never Smoker   . Smokeless tobacco: Not on file  . Alcohol Use: Not on file  . Drug Use: Not on file  . Sexual Activity: Not on file   Other Topics Concern  . Not on file   Social History Narrative    Medications:   Current Outpatient Prescriptions on File Prior to  Visit  Medication Sig Dispense Refill  . ACCU-CHEK SOFTCLIX LANCETS lancets     . Alcohol Swabs (B-D SINGLE USE SWABS REGULAR) PADS     . amLODipine (NORVASC) 5 MG tablet take 1 tablet by oral route  every day    . aspirin 81 MG tablet Take 81 mg by mouth once.    Marland Kitchen atorvastatin (LIPITOR) 20 MG tablet take 1 tablet by oral route  every day    . Blood Glucose Monitoring Suppl (ACCU-CHEK AVIVA PLUS) W/DEVICE KIT     . clonazePAM (KLONOPIN) 0.5 MG tablet Take 1 tablet (0.5 mg total) by mouth 2 (two) times daily. 30 tablet 0  . divalproex (DEPAKOTE) 500 MG DR tablet     . fenofibrate 160 MG tablet     . furosemide (LASIX) 40 MG tablet     . Multiple Vitamins-Minerals (MULTIVITAMIN ADULT PO) Use as directed 1 tablet in the mouth or throat daily.    Marland Kitchen NOVOLOG PENFILL cartridge     . QUEtiapine (SEROQUEL) 50 MG tablet     . triamcinolone lotion (KENALOG) 0.1 % APP EXT TO THE SCALP BID SPARINGLY FOR ITCHING OR IRRITATION  2  . Vitamin D, Ergocalciferol, (DRISDOL) 50000 UNITS CAPS capsule      No current facility-administered medications on file prior to visit.    Allergies:   Allergies  Allergen Reactions  . Chlorpromazine Rash    Physical Exam General: Obese middle-aged African-American lady, seated, in no evident distress Head: head normocephalic and atraumatic.   Neck: supple with no carotid or supraclavicular bruits Cardiovascular: regular rate and rhythm, no murmurs Musculoskeletal: no deformity Skin:  no rash/petichiae Vascular:  Normal pulses all extremities  Neurologic Exam Mental Status: Awake and fully alert. Oriented to place and time. Recent and remote memory intact. Attention span, concentration and fund of knowledge appropriate. Mood and affect appropriate.  Cranial Nerves: Fundoscopic exam not done . Pupils equal, briskly reactive to light. Extraocular movements full without nystagmus. Visual fields full to confrontation. Hearing intact. Facial sensation intact. Face,  tongue, palate moves normally and symmetrically.  Motor: Normal bulk and tone. Normal strength in all tested extremity muscles. Mild fine action tremor of outstretched upper extremities right greater than left. Tremor is absent at rest. No pill-rolling quality. No cogwheel rigidity, bradykinesia. Able to get up from the chair with arms folded across her chest easily. Sensory.: intact to touch , pinprick , position and vibratory sensation.  Coordination: Rapid alternating movements normal in all extremities. Finger-to-nose and heel-to-shin performed accurately bilaterally. Gait and Station: Arises from chair without difficulty. Stance is normal. Gait demonstrates normal stride length and balance . Able to heel, toe and tandem walk without difficulty. No festination, stooped posture or retropulsion. Reflexes: 1+ and symmetric. Toes downgoing.       ASSESSMENT: 34 year African-American lady with 3 year history of mild upper extremity action tremors likely mixed anxiety and medication effect  of Depakote.  Clinical history and exam is not suggestive of essential tremor or Parkinson's.    PLAN:  I had a long discussion with the patient with regards to her mild action tremors which are likely due to combination of underlying anxiety as well as medication effect of Depakote. I recommend she continue Klonopin and in fact discuss possible increase in dosing frequency with her psychologist Dr. Casimiro Needle. No further neurological workup is necessary at this time. She was advised to return for follow-up in the future only if necessary and no routine follow-up appointment was made.  Antony Contras, MD  Note: This document was prepared with digital dictation and possible smart phrase technology. Any transcriptional errors that result from this process are unintentional.

## 2015-07-24 ENCOUNTER — Telehealth: Payer: Self-pay | Admitting: Neurology

## 2015-07-24 NOTE — Telephone Encounter (Signed)
Rn call patient back about her last AVS note. PT stated she misplace it and needs a copy for Dr. Casimiro Needle. AVS will be put in the mail

## 2015-07-24 NOTE — Telephone Encounter (Signed)
Pt called said she misplace the note Dr Leonie Man gave her last OV to take to Dr Casimiro Needle. She is inquiring if that could be faxed to his office. She can be reached at [phone]

## 2015-07-24 NOTE — Telephone Encounter (Signed)
Address verify with patient. AVS put in the mail for patient.

## 2015-08-02 ENCOUNTER — Ambulatory Visit (INDEPENDENT_AMBULATORY_CARE_PROVIDER_SITE_OTHER): Payer: PPO | Admitting: Podiatry

## 2015-08-02 ENCOUNTER — Encounter: Payer: Self-pay | Admitting: Podiatry

## 2015-08-02 VITALS — BP 156/85 | HR 101 | Resp 16

## 2015-08-02 DIAGNOSIS — M79609 Pain in unspecified limb: Principal | ICD-10-CM

## 2015-08-02 DIAGNOSIS — B351 Tinea unguium: Secondary | ICD-10-CM

## 2015-08-02 DIAGNOSIS — M79673 Pain in unspecified foot: Secondary | ICD-10-CM | POA: Diagnosis not present

## 2015-08-02 DIAGNOSIS — E119 Type 2 diabetes mellitus without complications: Secondary | ICD-10-CM

## 2015-08-02 NOTE — Progress Notes (Signed)
   Subjective:    Patient ID: Kimberly Gross, female    DOB: 05-05-1951, 65 y.o.   MRN: [id number]  HPI this patient presents to the office with chief complaint of painful and long toenails on both feet. She states the nails are painful walking and wearing her shoes. She says she is a diabetic. She presents the office for preventive foot care services The patient presents here today for B/L toenail debridement.   Review of Systems  Constitutional: Positive for fatigue.  Eyes: Positive for visual disturbance.  Respiratory: Positive for shortness of breath.   Cardiovascular: Positive for leg swelling.  Genitourinary:       Increased urination  Musculoskeletal: Positive for back pain.  Neurological: Positive for tremors.  Psychiatric/Behavioral: Positive for behavioral problems. The patient is nervous/anxious.        Objective:   Physical Exam GENERAL APPEARANCE: Alert, conversant. Appropriately groomed. No acute distress.  VASCULAR: Pedal pulses palpable at  [facility] and PT bilateral.  Capillary refill time is immediate to all digits,  Normal temperature gradient.  Digital hair growth is present bilateral  NEUROLOGIC: sensation is normal to 5.07 monofilament at 5/5 sites bilateral.  Light touch is intact bilateral, Muscle strength normal.  MUSCULOSKELETAL: acceptable muscle strength, tone and stability bilateral.  Intrinsic muscluature intact bilateral.  Rectus appearance of foot and digits noted bilateral.   DERMATOLOGIC: skin color, texture, and turgor are within normal limits.  No preulcerative lesions or ulcers  are seen, no interdigital maceration noted.  No open lesions present.   No drainage noted.  NAILS  Thick disfigured discolored nails all right foot and big toenail left foot.         Assessment & Plan:  Onychomycosis  Diabetes with no complications.  ie  Debridement of Nails  RTC 4 months.  Gardiner Barefoot DPM

## 2015-08-28 ENCOUNTER — Other Ambulatory Visit: Payer: Self-pay

## 2015-08-28 DIAGNOSIS — Z1231 Encounter for screening mammogram for malignant neoplasm of breast: Secondary | ICD-10-CM

## 2015-10-01 ENCOUNTER — Other Ambulatory Visit: Payer: Self-pay | Admitting: Nephrology

## 2015-10-01 ENCOUNTER — Ambulatory Visit
Admission: RE | Admit: 2015-10-01 | Discharge: 2015-10-01 | Disposition: A | Discharge status: Home / Self Care | Payer: PPO | Source: Ambulatory Visit

## 2015-10-01 DIAGNOSIS — Z1231 Encounter for screening mammogram for malignant neoplasm of breast: Secondary | ICD-10-CM

## 2015-10-01 DIAGNOSIS — N261 Atrophy of kidney (terminal): Secondary | ICD-10-CM

## 2015-10-01 DIAGNOSIS — N184 Chronic kidney disease, stage 4 (severe): Secondary | ICD-10-CM

## 2015-10-01 DIAGNOSIS — I10 Essential (primary) hypertension: Secondary | ICD-10-CM

## 2015-10-09 ENCOUNTER — Ambulatory Visit
Admission: RE | Admit: 2015-10-09 | Discharge: 2015-10-09 | Disposition: A | Discharge status: Home / Self Care | Payer: PPO | Source: Ambulatory Visit | Attending: Nephrology | Admitting: Nephrology

## 2015-10-09 DIAGNOSIS — I10 Essential (primary) hypertension: Secondary | ICD-10-CM

## 2015-10-09 DIAGNOSIS — N261 Atrophy of kidney (terminal): Secondary | ICD-10-CM

## 2015-10-09 DIAGNOSIS — N184 Chronic kidney disease, stage 4 (severe): Secondary | ICD-10-CM

## 2015-10-09 IMAGING — US US RENAL
1 series · 14 of 25 positions shown · non-contrast
Comparison: CT [DATE].

CLINICAL DATA: Chronic renal disease.

EXAM:
RENAL / URINARY TRACT ULTRASOUND COMPLETE

[Series 1: us renal · 0.28mm/px · 14 of 32 slices shown]
[im 1/32]
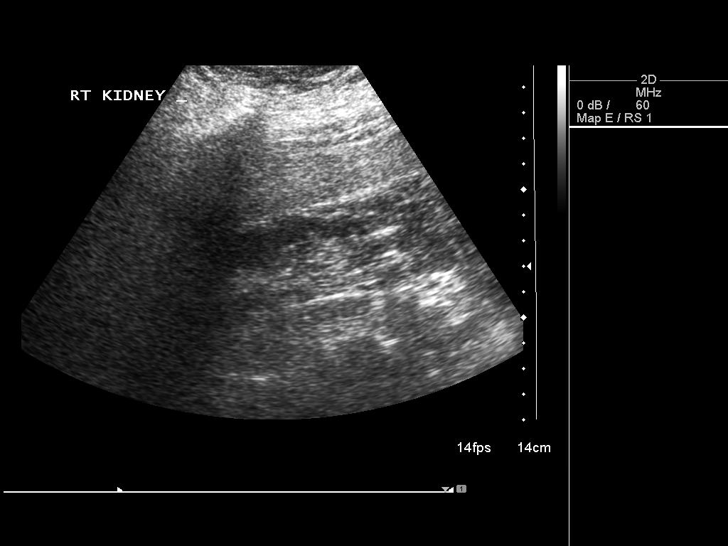
[im 3/32]
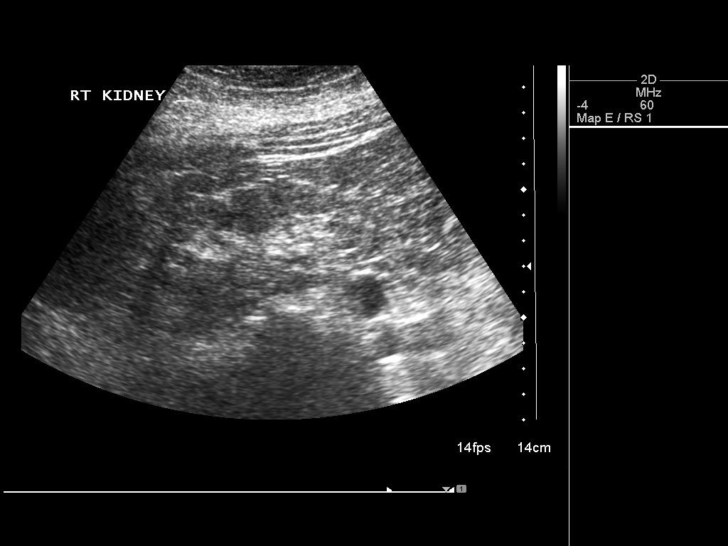
[im 6/32]
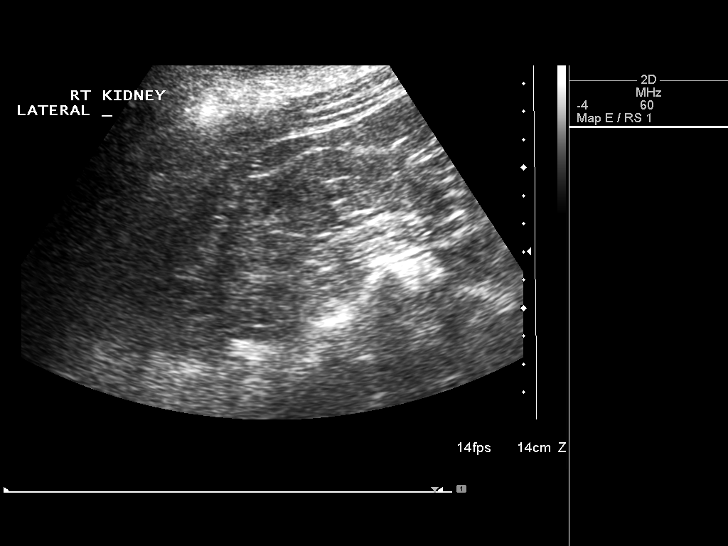
[im 8/32]
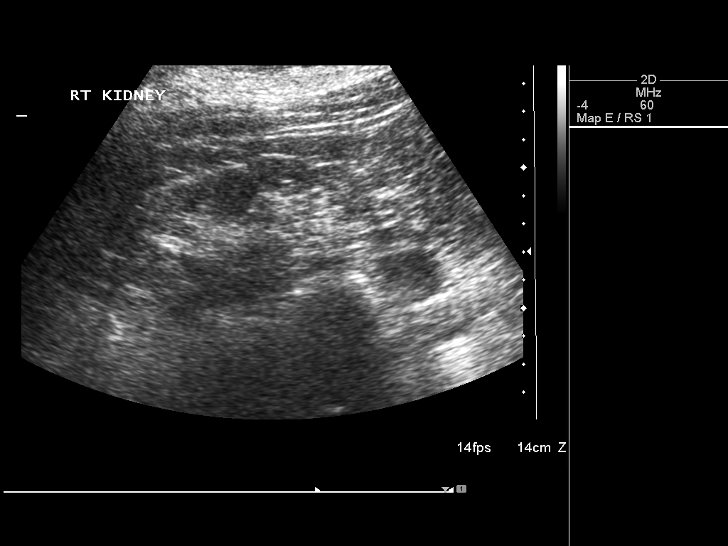
[im 11/32]
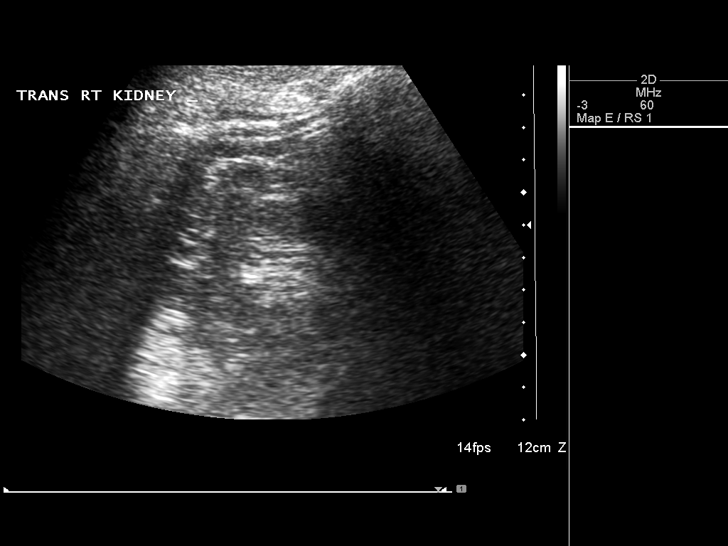
[im 12/32]
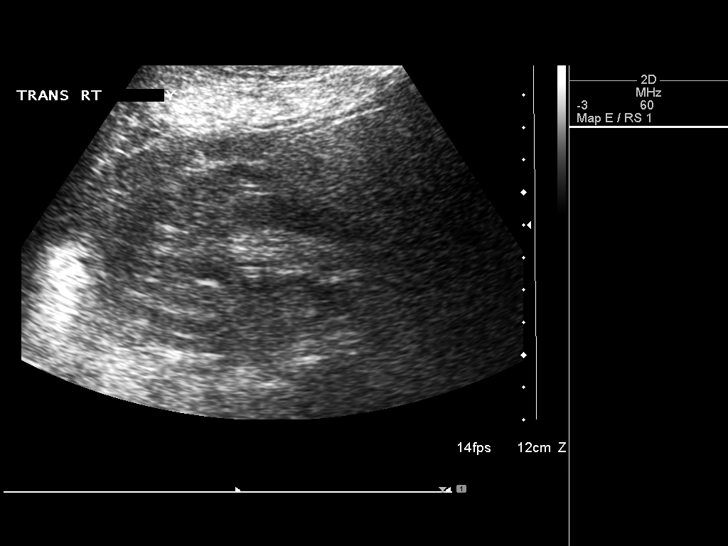
[im 15/32]
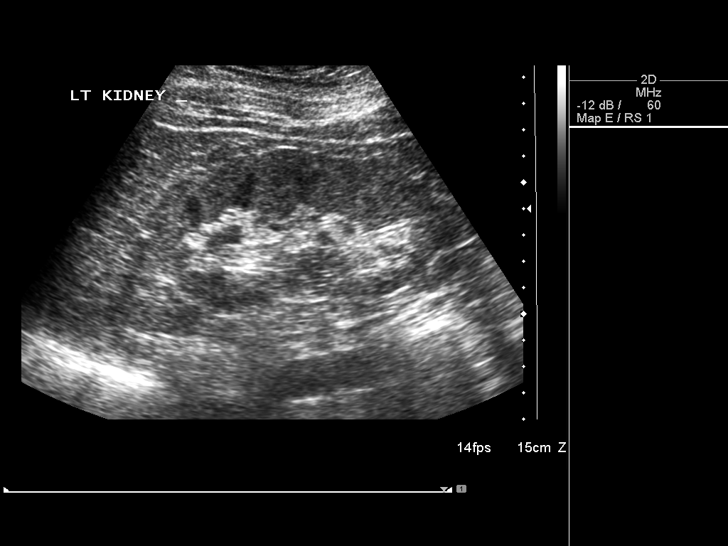
[im 17/32]
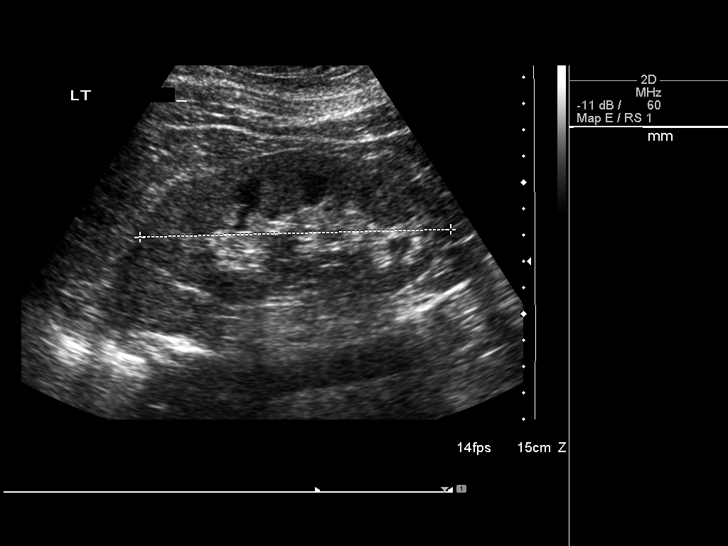
[im 20/32]
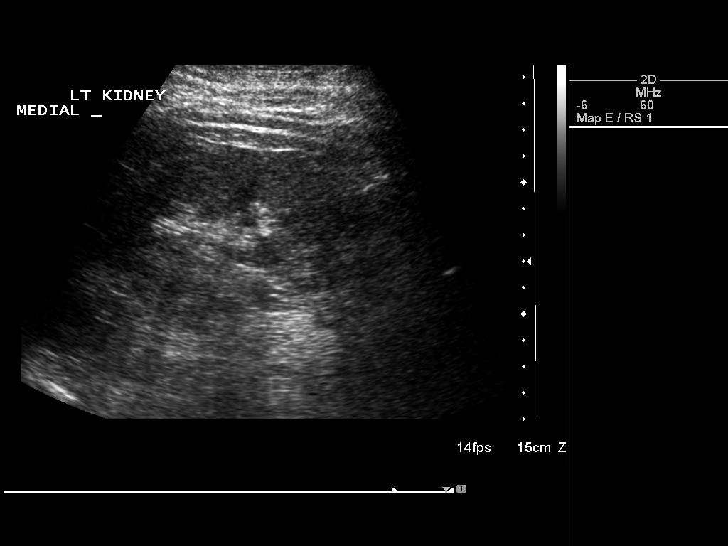
[im 21/32]
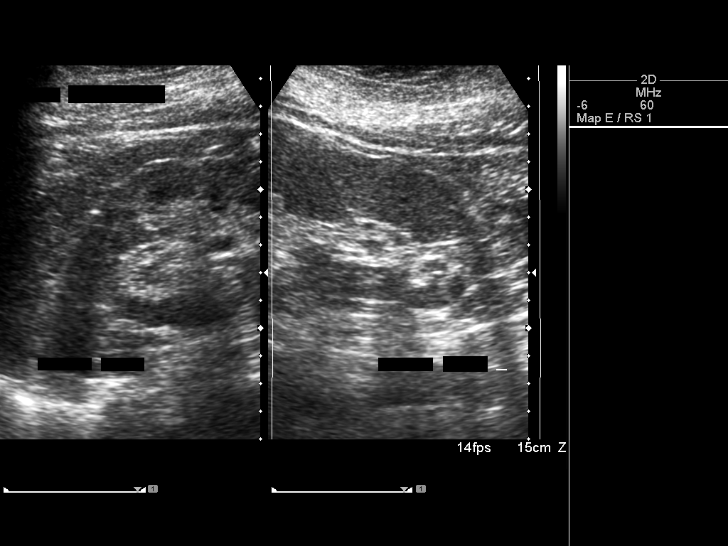
[im 24/32]
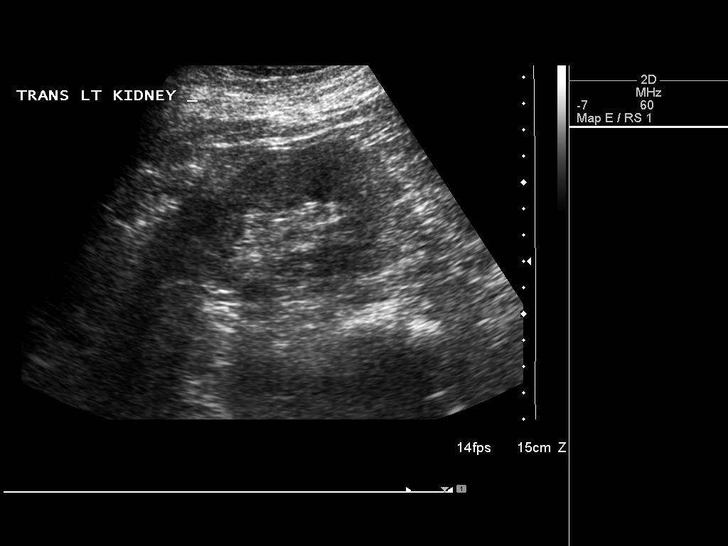
[im 26/32]
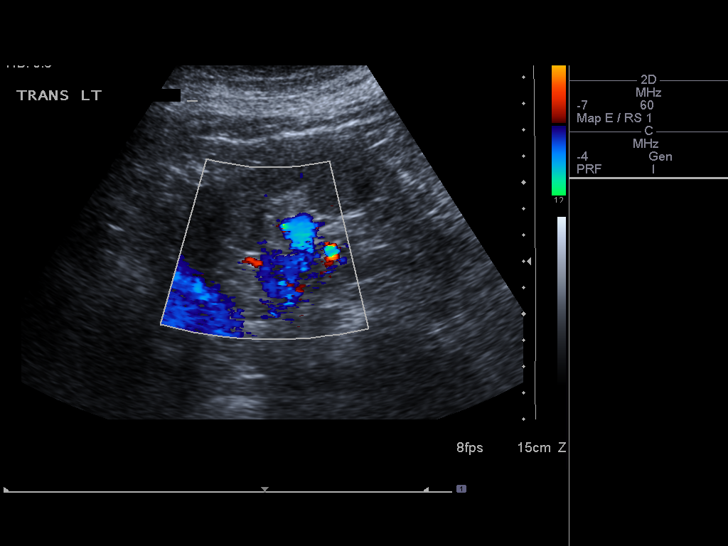
[im 29/32]
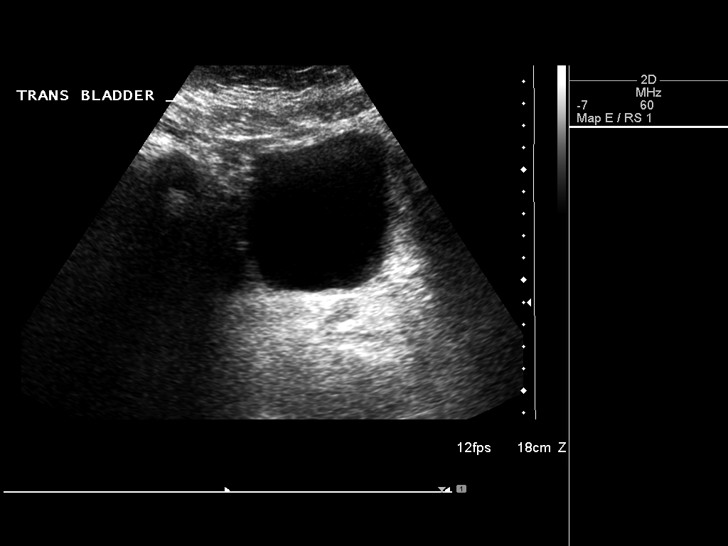
[im 32/32]
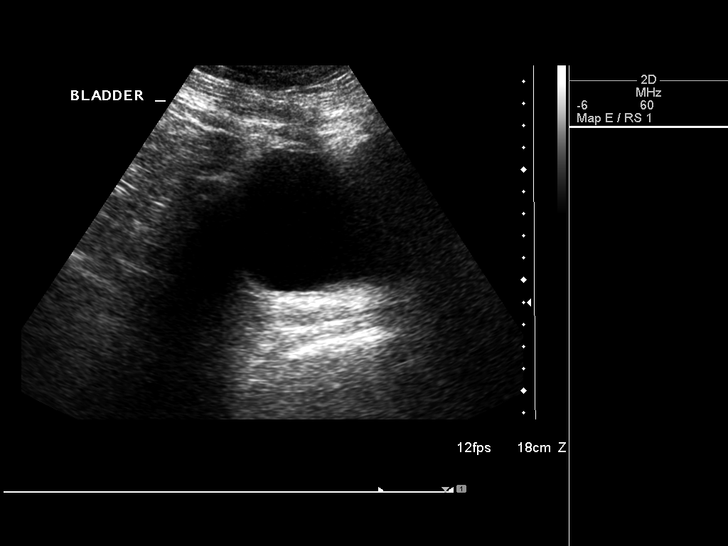

[14 of 25 positions shown; findings below may reference images not displayed]

FINDINGS: Right Kidney:

Length: 9.0 cm. Right kidney is echogenic. Cortical irregularity and
thinning noted consistent scarring and atrophy. No mass or
hydronephrosis visualized.

Left Kidney:

Length: 11.8 cm. Left kidney is echogenic. No mass or hydronephrosis
visualized.

Bladder:

Appears normal for degree of bladder distention.
IMPRESSION: 1. Bilateral renal increased echogenicity consistent with chronic
medical renal disease. No hydronephrosis. No bladder distention.

2. Right renal cortical irregularity and thinning noted consistent
with scarring and atrophy.

## 2015-11-28 ENCOUNTER — Encounter: Payer: Self-pay | Admitting: Podiatry

## 2015-11-28 ENCOUNTER — Ambulatory Visit (INDEPENDENT_AMBULATORY_CARE_PROVIDER_SITE_OTHER): Payer: PPO | Admitting: Podiatry

## 2015-11-28 DIAGNOSIS — E119 Type 2 diabetes mellitus without complications: Secondary | ICD-10-CM | POA: Diagnosis not present

## 2015-11-28 DIAGNOSIS — B351 Tinea unguium: Secondary | ICD-10-CM | POA: Diagnosis not present

## 2015-11-28 DIAGNOSIS — M79676 Pain in unspecified toe(s): Secondary | ICD-10-CM

## 2015-11-28 DIAGNOSIS — M79609 Pain in unspecified limb: Principal | ICD-10-CM

## 2015-11-28 NOTE — Progress Notes (Signed)
   Subjective:    Patient ID: Kimberly Gross, female    DOB: 07/30/1950, 65 y.o.   MRN: [id number]  HPI this patient presents to the office with chief complaint of painful and long toenails on both feet. She states the nails are painful walking and wearing her shoes. She says she is a diabetic. She presents the office for preventive foot care services The patient presents here today for B/L toenail debridement.   Review of Systems  Constitutional: Positive for fatigue.  Eyes: Positive for visual disturbance.  Respiratory: Positive for shortness of breath.   Cardiovascular: Positive for leg swelling.  Genitourinary:       Increased urination  Musculoskeletal: Positive for back pain.  Neurological: Positive for tremors.  Psychiatric/Behavioral: Positive for behavioral problems. The patient is nervous/anxious.        Objective:   Physical Exam GENERAL APPEARANCE: Alert, conversant. Appropriately groomed. No acute distress.  VASCULAR: Pedal pulses palpable at  [facility] and PT bilateral.  Capillary refill time is immediate to all digits,  Normal temperature gradient.  Digital hair growth is present bilateral  NEUROLOGIC: sensation is normal to 5.07 monofilament at 5/5 sites bilateral.  Light touch is intact bilateral, Muscle strength normal.  MUSCULOSKELETAL: acceptable muscle strength, tone and stability bilateral.  Intrinsic muscluature intact bilateral.  Rectus appearance of foot and digits noted bilateral.   DERMATOLOGIC: skin color, texture, and turgor are within normal limits.  No preulcerative lesions or ulcers  are seen, no interdigital maceration noted.  No open lesions present.   No drainage noted.  NAILS  Thick disfigured discolored nails all right foot and big toenail left foot.         Assessment & Plan:  Onychomycosis  Diabetes with no complications.  ie  Debridement of Nails  RTC 4 months.  Gardiner Barefoot DPM

## 2015-12-21 ENCOUNTER — Ambulatory Visit: Payer: PPO | Admitting: Dietician

## 2016-02-27 ENCOUNTER — Encounter: Payer: PPO | Attending: Internal Medicine | Admitting: *Deleted

## 2016-02-27 DIAGNOSIS — Z713 Dietary counseling and surveillance: Secondary | ICD-10-CM | POA: Diagnosis present

## 2016-02-27 DIAGNOSIS — E119 Type 2 diabetes mellitus without complications: Secondary | ICD-10-CM | POA: Diagnosis not present

## 2016-02-27 NOTE — Progress Notes (Signed)
Diabetes Self-Management Education  Visit Type: First/Initial  Appt. Start Time: 0830 Appt. End Time: 1000  02/27/2016  [name], identified by name and date of birth, is a 65 y.o. female with a diagnosis of Diabetes: Type 2. She is here with her grown daughter, Kimberly Gross, who participated in the visit with encouragement from the patient. Patient states history of DM 2 since 2005 with no previous diabetes education. She wants to lose some weight, control her BG better and protect her kidneys.   ASSESSMENT  Height 5\' 4"  (1.626 m), weight 282 lb 12.8 oz (128.3 kg). Body mass index is 48.54 kg/m.      Diabetes Self-Management Education - 02/27/16 0844      Visit Information   Visit Type First/Initial     Initial Visit   Diabetes Type Type 2   Are you currently following a meal plan? No   Are you taking your medications as prescribed? Yes   Date Diagnosed 2005     Health Coping   How would you rate your overall health? Fair     Psychosocial Assessment   Patient Belief/Attitude about Diabetes Motivated to manage diabetes   Self-management support Family   Other persons present Patient;Family Member  daughter   Patient Concerns Nutrition/Meal planning;Monitoring;Glycemic Control;Weight Control   Preferred Learning Style Auditory;Visual   What is the last grade level you completed in school? 12     Pre-Education Assessment   Patient understands the diabetes disease and treatment process. Needs Instruction   Patient understands incorporating nutritional management into lifestyle. Needs Instruction   Patient undertands incorporating physical activity into lifestyle. Needs Instruction   Patient understands using medications safely. Needs Instruction   Patient understands monitoring blood glucose, interpreting and using results Needs Instruction   Patient understands prevention, detection, and treatment of acute complications. Needs Instruction   Patient understands  prevention, detection, and treatment of chronic complications. Needs Instruction   Patient understands how to develop strategies to address psychosocial issues. Needs Instruction   Patient understands how to develop strategies to promote health/change behavior. Needs Instruction     Complications   Last HgB A1C per patient/outside source 9.3 %   How often do you check your blood sugar? 1-2 times/day   Fasting Blood glucose range (mg/dL) >200   Postprandial Blood glucose range (mg/dL) >200   Number of hypoglycemic episodes per month 0   Have you had a dilated eye exam in the past 12 months? No   Have you had a dental exam in the past 12 months? No   Are you checking your feet? Yes   How many days per week are you checking your feet? 7     Dietary Intake   Breakfast boiled egg, (no more breads)  OR 1 Tbsp PNB OR sausage and egg biscuit once a week   Snack (morning) varies; usually a 2nd breakfast of eggs and cheese   Lunch tuna with 5 crackers OR 2 hot dogs on bun with chili, slaw, onions OR a burger lettuce, tomato, mayo (no fries) but likes potatoes   Snack (afternoon) can of soup with 5 crackers   Dinner meat, potato, occasionally vegetable,    Snack (evening) left overs from dinner   Beverage(s) grapefruit or orange juice, hot tea, coffee with creamer, water, regular soda,     Exercise   Exercise Type ADL's   How many days per week to you exercise? 0   How many minutes per day do you exercise?  0   Total minutes per week of exercise 0     Patient Education   Previous Diabetes Education No   Disease state  Explored patient's options for treatment of their diabetes   Nutrition management  Information on hints to eating out and maintain blood glucose control.   Physical activity and exercise  Role of exercise on diabetes management, blood pressure control and cardiac health.   Medications Reviewed patients medication for diabetes, action, purpose, timing of dose and side effects.    Monitoring Purpose and frequency of SMBG.;Identified appropriate SMBG and/or A1C goals.   Chronic complications Relationship between chronic complications and blood glucose control   Psychosocial adjustment Helped patient identify a support system for diabetes management  talk with pharmacist to obtain cost options for medications     Individualized Goals (developed by patient)   Nutrition General guidelines for healthy choices and portions discussed   Physical Activity Exercise 3-5 times per week  Arm Chair exercises at [facility]   Medications take my medication as prescribed;Other (comment)  Let MD know about cost of different types of insulin and ask about taking fast acting insulin at each meal   Monitoring  test my blood glucose as discussed  consider testing after exericse as able     Post-Education Assessment   Patient understands the diabetes disease and treatment process. Demonstrates understanding / competency   Patient understands incorporating nutritional management into lifestyle. Needs Review   Patient undertands incorporating physical activity into lifestyle. Needs Review   Patient understands using medications safely. Demonstrates understanding / competency   Patient understands monitoring blood glucose, interpreting and using results Demonstrates understanding / competency   Patient understands prevention, detection, and treatment of acute complications. Needs Review   Patient understands prevention, detection, and treatment of chronic complications. Demonstrates understanding / competency     Outcomes   Expected Outcomes Demonstrated interest in learning. Expect positive outcomes   Future DMSE 4-6 wks   Program Status Not Completed      Individualized Plan for Diabetes Self-Management Training:   Learning Objective:  Patient will have a greater understanding of diabetes self-management. Patient education plan is to attend individual and/or group sessions per  assessed needs and concerns.   Plan:   Patient Instructions  Plan:  Aim for 2 Carb Choices per meal (30 grams) +/- 1 either way  Aim for 0-1 Carbs per snack if hungry  We will continue your instruction on Carb Counting at your follow up visit in 1 month. Include protein in moderation with your meals and snacks Consider  increasing your activity level by going back to the [place] to walk or Arm Chair exercise as tolerated Consider checking BG at alternate times per day such as before breakfast and supper or bedtime Consider taking medication Lantus at the same time each day, you have chosen 9 PM to help your sugars be more consistent. Talk to your pharmacist about cost of brands of insulin (Novo or [place]) as well as cost of Regular insulin compared to Novolog? Ask MD about taking Novolog (or Regular) before each meal instead of once a day?      Expected Outcomes:  Demonstrated interest in learning. Expect positive outcomes  Education material provided: Living Well with Diabetes, Meal plan card, Snack sheet and Carbohydrate counting sheet, Insulin action sheet, Diabetes Medication Sheet  If problems or questions, patient to contact team via:  Phone and Email  Future DSME appointment: 4-6 wks

## 2016-02-27 NOTE — Patient Instructions (Signed)
Plan:  Aim for 2 Carb Choices per meal (30 grams) +/- 1 either way  Aim for 0-1 Carbs per snack if hungry  Include protein in moderation with your meals and snacks Consider  increasing your activity level by going back to the [place] to walk or Arm Chair exercise as tolerated Consider checking BG at alternate times per day such as before breakfast and supper or bedtime Consider taking medication Lantus at the same time each day, you have chosen 9 PM to help your sugars be more consistent. Talk to your pharmacist about cost of brands of insulin (Novo or [place]) as well as cost of Regular insulin compared to Novolog? Ask MD about taking Novolog (or Regular) before each meal instead of once a day?

## 2016-03-12 ENCOUNTER — Ambulatory Visit (INDEPENDENT_AMBULATORY_CARE_PROVIDER_SITE_OTHER): Payer: PPO | Admitting: Podiatry

## 2016-03-12 DIAGNOSIS — M79676 Pain in unspecified toe(s): Secondary | ICD-10-CM

## 2016-03-12 DIAGNOSIS — E119 Type 2 diabetes mellitus without complications: Secondary | ICD-10-CM | POA: Diagnosis not present

## 2016-03-12 DIAGNOSIS — B351 Tinea unguium: Secondary | ICD-10-CM

## 2016-03-12 DIAGNOSIS — M79609 Pain in unspecified limb: Principal | ICD-10-CM

## 2016-03-12 NOTE — Progress Notes (Signed)
   Subjective:    Patient ID: Kimberly Gross, female    DOB: Jan 23, 1951, 65 y.o.   MRN: [id number]  HPI this patient presents to the office with chief complaint of painful and long toenails on both feet. She states the nails are painful walking and wearing her shoes. She says she is a diabetic. She presents the office for preventive foot care services The patient presents here today for B/L toenail debridement.   Review of Systems  Constitutional: Positive for fatigue.  Eyes: Positive for visual disturbance.  Respiratory: Positive for shortness of breath.   Cardiovascular: Positive for leg swelling.  Genitourinary:       Increased urination  Musculoskeletal: Positive for back pain.  Neurological: Positive for tremors.  Psychiatric/Behavioral: Positive for behavioral problems. The patient is nervous/anxious.        Objective:   Physical Exam GENERAL APPEARANCE: Alert, conversant. Appropriately groomed. No acute distress.  VASCULAR: Pedal pulses palpable at  [facility] and PT bilateral.  Capillary refill time is immediate to all digits,  Normal temperature gradient.  Digital hair growth is present bilateral  NEUROLOGIC: sensation is normal to 5.07 monofilament at 5/5 sites bilateral.  Light touch is intact bilateral, Muscle strength normal.  MUSCULOSKELETAL: acceptable muscle strength, tone and stability bilateral.  Intrinsic muscluature intact bilateral.  Rectus appearance of foot and digits noted bilateral.   DERMATOLOGIC: skin color, texture, and turgor are within normal limits.  No preulcerative lesions or ulcers  are seen, no interdigital maceration noted.  No open lesions present.   No drainage noted.  NAILS  Thick disfigured discolored nails all right foot and big toenail left foot.         Assessment & Plan:  Onychomycosis  Diabetes with no complications.       Debridement of Nails  RTC 4 months.  Gardiner Barefoot DPM

## 2016-04-02 ENCOUNTER — Encounter: Payer: PPO | Attending: Internal Medicine | Admitting: *Deleted

## 2016-04-02 DIAGNOSIS — E1165 Type 2 diabetes mellitus with hyperglycemia: Secondary | ICD-10-CM

## 2016-04-02 DIAGNOSIS — E119 Type 2 diabetes mellitus without complications: Secondary | ICD-10-CM | POA: Diagnosis not present

## 2016-04-02 DIAGNOSIS — Z713 Dietary counseling and surveillance: Secondary | ICD-10-CM | POA: Diagnosis not present

## 2016-04-02 DIAGNOSIS — E118 Type 2 diabetes mellitus with unspecified complications: Secondary | ICD-10-CM

## 2016-04-02 NOTE — Patient Instructions (Signed)
Plan:  Aim for 2 Carb Choices per meal (30 grams) +/- 1 either way  Aim for 0-1 Carbs per snack if hungry  Include protein in moderation with your meals and snacks Continue with your activity level by going back to the [place] to walk or Arm Chair exercise as tolerated [place]! Continue checking BG at alternate times per day such as before breakfast and supper or bedtime Continue taking medication Lantus at the same time each day, you have chosen 9 PM to help your sugars be more consistent. Talk to your pharmacist about cost of brands of insulin (Novo or [place]) as well as cost of Regular insulin compared to Novolog? Continue taking Novolog (or Regular) before each meal instead of once a day Your are doing a fabulous job! Congratulations!

## 2016-04-02 NOTE — Progress Notes (Signed)
Diabetes Self-Management Education  Visit Type:  Follow-up  Appt. Start Time: 1015  Appt. End Time: 0867  04/02/2016  [name], identified by name and date of birth, is a 65 y.o. female with a diagnosis of Diabetes: Type 2. She is very excited about her 6 pound weight loss in the past 4 weeks. She has stopped drinking most sweetened beverages, she is following the meal plan we discussed, she is reading food labels, taking her Novolog before each meal and exercising for 45 minutes several days a week! She is feeling better, more energetic. She reports that her BG have dropped over 100 mg/dl form the 300-400's to the 200's. She still has frequent urination but that is improving.   ASSESSMENT  Height 5\' 4"  (1.626 m), weight 276 lb 9.6 oz (125.5 kg). Body mass index is 47.48 kg/m.       Diabetes Self-Management Education - 04/02/16 1010      Health Coping   How would you rate your overall health? Fair     Psychosocial Assessment   Patient Belief/Attitude about Diabetes Motivated to manage diabetes   Self-management support Family     Complications   How often do you check your blood sugar? 1-2 times/day   Fasting Blood glucose range (mg/dL) >200   Postprandial Blood glucose range (mg/dL) >200   Number of hypoglycemic episodes per month 0     Dietary Intake   Breakfast boiled egg with slice of toast   Snack (morning) Greek yogurt, peanuts   Lunch tuna and crackers or cottage cheese, hot dogs less often, maybe once a week   Dinner meat, starch and vegetables (frozen steamed)   Beverage(s) hot tea with Splenda, water, (no more regular soda) occasionally diluted grapefruit juice     Exercise   Exercise Type Light (walking / raking leaves)  [place] for 45 minutes Arm Chair exercises twice a week, watchs [employer]  tape too   How many days per week to you exercise? 4   How many minutes per day do you exercise? 45   Total minutes per week of exercise 180     Patient Self-Evaluation of Goals - Patient rates self as meeting previously set goals (% of time)   Nutrition >75%   Physical Activity >75%   Medications 50 - 75 %   Monitoring >75%   Problem Solving >75%   Reducing Risk >75%   Health Coping >75%     Subsequent Visit   Since your last visit have you continued or begun to take your medications as prescribed? Yes   Weight Loss (lbs) 6   Since your last visit, are you checking your blood glucose at least once a day? Yes      Learning Objective:  Patient will have a greater understanding of diabetes self-management. Patient education plan is to attend individual and/or group sessions per assessed needs and concerns.   Plan:   Patient Instructions  Plan:  Aim for 2 Carb Choices per meal (30 grams) +/- 1 either way  Aim for 0-1 Carbs per snack if hungry  Include protein in moderation with your meals and snacks Continue with your activity level by going back to the [place] to walk or Arm Chair exercise as tolerated [place]! Continue checking BG at alternate times per day such as before breakfast and supper or bedtime Continue taking medication Lantus at the same time each day, you have chosen 9 PM to help your sugars be  more consistent. Talk to your pharmacist about cost of brands of insulin (Novo or [place]) as well as cost of Regular insulin compared to Novolog? Continue taking Novolog (or Regular) before each meal instead of once a day Your are doing a fabulous job! Congratulations!          Expected Outcomes:  Demonstrated interest in learning. Expect positive outcomes  Education material provided: Living Well with Diabetes and A1C conversion sheet, Sample Grocery List  If problems or questions, patient to contact team via:  Phone and Email  Future DSME appointment: -  1 month

## 2016-05-02 ENCOUNTER — Ambulatory Visit: Payer: PPO | Admitting: *Deleted

## 2016-05-09 ENCOUNTER — Encounter: Payer: PPO | Attending: Internal Medicine | Admitting: *Deleted

## 2016-05-09 DIAGNOSIS — E119 Type 2 diabetes mellitus without complications: Secondary | ICD-10-CM | POA: Diagnosis not present

## 2016-05-09 DIAGNOSIS — Z713 Dietary counseling and surveillance: Secondary | ICD-10-CM | POA: Insufficient documentation

## 2016-05-09 NOTE — Patient Instructions (Signed)
Plan:  Aim for 2 Carb Choices per meal (30 grams) +/- 1 either way  Aim for 0-1 Carbs per snack if hungry  Include protein in moderation with your meals and snacks Continue with your activity level by going back to the [place] to walk or Arm Chair exercise as tolerated [place]! Continue checking BG at alternate times per day such as before breakfast and supper or bedtime Continue taking medication Lantus at the same time each day, you have chosen 9 PM to help your sugars be more consistent. We called your insurance company to check on the cost of brands of diabetees medication similar to Victoza that is covered Tier 1 and found out they cover Symlin, so you can let your MD know about that.  Continue taking Novolog (or Regular) before each meal instead of once a day Your are doing a fabulous job! Congratulations!

## 2016-05-09 NOTE — Progress Notes (Signed)
Diabetes Self-Management Education  Visit Type:  (P) Follow-up  Appt. Start Time: 0730 Appt. End Time: 0800  05/09/2016  [name], identified by name and date of birth, is a 65 y.o. female with a diagnosis of Diabetes: (P) Type 2.  She cannot afford Victoza medication at over $300 per month in addition to other medications. She asked me to contact her insurance company to see what other medications may be covered at lower cost especially while in the donut hole with her Medicare. She states she continues to exercise regularly and has also been on some day trips where she walked quite a bit. She states she continues to drink plenty of water each day and has increased her vegetable portions. She states her current BG range is between 120-180 mg/dl pre and post meals!  ASSESSMENT  Height 5\' 4"  (1.626 m), weight 277 lb 3.2 oz (125.7 kg). Body mass index is 47.58 kg/m.       Diabetes Self-Management Education - 05/09/16 1100      Psychosocial Assessment   Patient Belief/Attitude about Diabetes (P)  Motivated to manage diabetes   Self-management support (P)  Family     Complications   Last HgB A1C per patient/outside source (P)  --  9.3   Fasting Blood glucose range (mg/dL) (P)  70-129   Postprandial Blood glucose range (mg/dL) (P)  130-179   Number of hypoglycemic episodes per month (P)  --  0     Exercise   Exercise Type (P)  Light (walking / raking leaves)  she goes to [employer] and walks 5 days a week!     Patient Education   Medications (P)  Other (comment)  Assisted her in calling insur co. re: Tier coverage of meds     Individualized Goals (developed by patient)   Nutrition (P)  General guidelines for healthy choices and portions discussed   Physical Activity (P)  Exercise 3-5 times per week   Medications (P)  take my medication as prescribed   Monitoring  (P)  test blood glucose pre and post meals as discussed     Patient Self-Evaluation of Goals - Patient  rates self as meeting previously set goals (% of time)   Nutrition (P)  >75%   Physical Activity (P)  >75%   Medications (P)  50 - 75 %   Monitoring (P)  >75%   Problem Solving (P)  >75%   Reducing Risk (P)  >75%   Health Coping (P)  >75%     Post-Education Assessment   Patient understands the diabetes disease and treatment process. (P)  Demonstrates understanding / competency   Patient understands incorporating nutritional management into lifestyle. (P)  Demonstrates understanding / competency   Patient undertands incorporating physical activity into lifestyle. (P)  Demonstrates understanding / competency   Patient understands using medications safely. (P)  Needs Review   Patient understands monitoring blood glucose, interpreting and using results (P)  Demonstrates understanding / competency   Patient understands prevention, detection, and treatment of chronic complications. (P)  Demonstrates understanding / competency     Outcomes   Program Status (P)  Not Completed     Subsequent Visit   Since your last visit, are you checking your blood glucose at least once a day? (P)  Yes      Learning Objective:  Patient will have a greater understanding of diabetes self-management. Patient education plan is to attend individual and/or group sessions per assessed needs and concerns.  Her insurance company stated that Symlin is covered as a Tier 1 medication where Victoza is Tier 3.   Plan:   Patient Instructions  Plan:  Aim for 2 Carb Choices per meal (30 grams) +/- 1 either way  Aim for 0-1 Carbs per snack if hungry  Include protein in moderation with your meals and snacks Continue with your activity level by going back to the [place] to walk or Arm Chair exercise as tolerated [place]! Continue checking BG at alternate times per day such as before breakfast and supper or bedtime Continue taking medication Lantus at the same time each day, you have chosen 9 PM to help your sugars  be more consistent. We called your insurance company to check on the cost of brands of diabetees medication similar to Victoza that is covered Tier 1 and found out they cover Symlin, so you can let your MD know about that.  Continue taking Novolog (or Regular) before each meal instead of once a day Your are doing a fabulous job! Congratulations!          Expected Outcomes:  (P) Demonstrated interest in learning. Expect positive outcomes  Education material provided: Diabetes Medication and Insulin handouts  If problems or questions, patient to contact team via:  Phone and Email  Future DSME appointment: - (P) 2 months

## 2016-06-06 ENCOUNTER — Other Ambulatory Visit (HOSPITAL_COMMUNITY): Payer: Self-pay | Admitting: Psychiatry

## 2016-06-11 ENCOUNTER — Ambulatory Visit (INDEPENDENT_AMBULATORY_CARE_PROVIDER_SITE_OTHER): Payer: PPO | Admitting: Podiatry

## 2016-06-11 ENCOUNTER — Encounter: Payer: Self-pay | Admitting: Podiatry

## 2016-06-11 VITALS — Ht 65.0 in | Wt 281.0 lb

## 2016-06-11 DIAGNOSIS — B351 Tinea unguium: Secondary | ICD-10-CM | POA: Diagnosis not present

## 2016-06-11 DIAGNOSIS — E119 Type 2 diabetes mellitus without complications: Secondary | ICD-10-CM | POA: Diagnosis not present

## 2016-06-11 DIAGNOSIS — M79609 Pain in unspecified limb: Secondary | ICD-10-CM | POA: Diagnosis not present

## 2016-06-11 NOTE — Progress Notes (Signed)
   Subjective:    Patient ID: Kimberly Gross, female    DOB: 1950/12/01, 65 y.o.   MRN: [id number]  HPI this patient presents to the office with chief complaint of painful and long toenails on both feet. She states the nails are painful walking and wearing her shoes. She says she is a diabetic. She presents the office for preventive foot care services The patient presents here today for B/L toenail debridement.   Review of Systems  Constitutional: Positive for fatigue.  Eyes: Positive for visual disturbance.  Respiratory: Positive for shortness of breath.   Cardiovascular: Positive for leg swelling.  Genitourinary:       Increased urination  Musculoskeletal: Positive for back pain.  Neurological: Positive for tremors.  Psychiatric/Behavioral: Positive for behavioral problems. The patient is nervous/anxious.        Objective:   Physical Exam GENERAL APPEARANCE: Alert, conversant. Appropriately groomed. No acute distress.  VASCULAR: Pedal pulses palpable at  [facility] and PT bilateral.  Capillary refill time is immediate to all digits,  Normal temperature gradient.  Digital hair growth is present bilateral  NEUROLOGIC: sensation is normal to 5.07 monofilament at 5/5 sites bilateral.  Light touch is intact bilateral, Muscle strength normal.  MUSCULOSKELETAL: acceptable muscle strength, tone and stability bilateral.  Intrinsic muscluature intact bilateral.  Rectus appearance of foot and digits noted bilateral.   DERMATOLOGIC: skin color, texture, and turgor are within normal limits.  No preulcerative lesions or ulcers  are seen, no interdigital maceration noted.  No open lesions present.   No drainage noted.  NAILS  Thick disfigured discolored nails all right foot and big toenail left foot.         Assessment & Plan:  Onychomycosis  Diabetes with no complications.       Debridement of Nails  RTC 4 months.  Gardiner Barefoot DPM

## 2016-06-13 ENCOUNTER — Encounter: Payer: PPO | Attending: Internal Medicine | Admitting: *Deleted

## 2016-06-13 DIAGNOSIS — E119 Type 2 diabetes mellitus without complications: Secondary | ICD-10-CM | POA: Insufficient documentation

## 2016-06-13 DIAGNOSIS — Z713 Dietary counseling and surveillance: Secondary | ICD-10-CM | POA: Insufficient documentation

## 2016-06-13 NOTE — Patient Instructions (Signed)
Plan:  Aim for 2 Carb Choices per meal (30 grams) +/- 1 either way  Aim for 0-1 Carbs per snack if hungry  Include protein in moderation with your meals and snacks Consider reducing your sodium intake to help with water retention and blood pressure Continue with your activity level by going back to the [place] to walk or Arm Chair exercise as tolerated  Consider adding arm chair exercises during tv commercials to increase your activity also Consider recording your BG's in the Log Book and put a star every time you exercise Continue checking BG at alternate times per day such as before breakfast and supper or bedtime Continue taking medication Lantus at the same time each day, you have chosen 9 PM to help your sugars be more consistent. We called your insurance company to check on the cost of brands of diabetees medication similar to Victoza that is covered Tier 1 and found out they cover Symlin, so you can let your MD know about that.  Continue taking Novolog (or Regular) before each meal instead of once a day

## 2016-06-19 NOTE — Progress Notes (Signed)
Diabetes Self-Management Education  Visit Type:     Appt. Start Time: 0730 Appt. End Time: 0800  06/19/2016  [name], identified by name and date of birth, is a 65 y.o. female with a diagnosis of Diabetes:  .  She states the MD has DC'd the Victoza due to cost to patient. The patient continues to take 70/30 insulin before each meal and Lantus every night. She continues to exercise twice a week at the [facility] and enjoys going. She states she is Carb counting about 50% of the time and has consumed some canned soup lately, which may explain some swelling and recent weight gain. She states her BG's have climbed to above 200 mg/dl recently too.   ASSESSMENT  Height 5\' 4"  (1.626 m), weight 286 lb (129.7 kg). Body mass index is 49.09 kg/m.   Learning Objective:  Patient will have a greater understanding of diabetes self-management. Patient education plan is to attend individual and/or group sessions per assessed needs and concerns.  Plan:   Patient Instructions  Plan:  Aim for 2 Carb Choices per meal (30 grams) +/- 1 either way  Aim for 0-1 Carbs per snack if hungry  Include protein in moderation with your meals and snacks Consider reducing your sodium intake to help with water retention and blood pressure Continue with your activity level by going back to the [place] to walk or Arm Chair exercise as tolerated  Consider adding arm chair exercises during tv commercials to increase your activity also Consider recording your BG's in the Log Book and put a star every time you exercise Continue checking BG at alternate times per day such as before breakfast and supper or bedtime Continue taking medication Lantus at the same time each day, you have chosen 9 PM to help your sugars be more consistent. We called your insurance company to check on the cost of brands of diabetees medication similar to Victoza that is covered Tier 1 and found out they cover Symlin, so you can let  your MD know about that.  Continue taking Novolog (or Regular) before each meal instead of once a day        Expected Outcomes:   Patient is still motivated, positive outcomes expected  Education material provided: Log Book, Plate Method handout, Arm Chair Exercise handout  If problems or questions, patient to contact team via:  Phone and Email  Future DSME appointment: -  PRN

## 2016-06-24 ENCOUNTER — Other Ambulatory Visit (HOSPITAL_COMMUNITY): Payer: Self-pay | Admitting: Psychiatry

## 2016-06-24 DIAGNOSIS — R251 Tremor, unspecified: Secondary | ICD-10-CM

## 2016-07-01 ENCOUNTER — Other Ambulatory Visit (HOSPITAL_COMMUNITY): Payer: Self-pay | Admitting: Psychiatry

## 2016-07-01 DIAGNOSIS — R251 Tremor, unspecified: Secondary | ICD-10-CM

## 2016-07-15 DIAGNOSIS — L668 Other cicatricial alopecia: Secondary | ICD-10-CM | POA: Diagnosis not present

## 2016-07-21 DIAGNOSIS — F250 Schizoaffective disorder, bipolar type: Secondary | ICD-10-CM | POA: Diagnosis not present

## 2016-07-31 ENCOUNTER — Ambulatory Visit: Payer: PPO | Admitting: *Deleted

## 2016-08-08 DIAGNOSIS — I129 Hypertensive chronic kidney disease with stage 1 through stage 4 chronic kidney disease, or unspecified chronic kidney disease: Secondary | ICD-10-CM | POA: Diagnosis not present

## 2016-08-08 DIAGNOSIS — R5383 Other fatigue: Secondary | ICD-10-CM | POA: Diagnosis not present

## 2016-08-08 DIAGNOSIS — Z79899 Other long term (current) drug therapy: Secondary | ICD-10-CM | POA: Diagnosis not present

## 2016-08-08 DIAGNOSIS — N184 Chronic kidney disease, stage 4 (severe): Secondary | ICD-10-CM | POA: Diagnosis not present

## 2016-08-08 DIAGNOSIS — R413 Other amnesia: Secondary | ICD-10-CM | POA: Diagnosis not present

## 2016-08-08 DIAGNOSIS — E1122 Type 2 diabetes mellitus with diabetic chronic kidney disease: Secondary | ICD-10-CM | POA: Diagnosis not present

## 2016-08-12 DIAGNOSIS — E1122 Type 2 diabetes mellitus with diabetic chronic kidney disease: Secondary | ICD-10-CM | POA: Diagnosis not present

## 2016-08-21 ENCOUNTER — Encounter: Payer: Medicare Other | Attending: Internal Medicine | Admitting: *Deleted

## 2016-08-21 DIAGNOSIS — E119 Type 2 diabetes mellitus without complications: Secondary | ICD-10-CM

## 2016-08-21 DIAGNOSIS — Z713 Dietary counseling and surveillance: Secondary | ICD-10-CM | POA: Diagnosis not present

## 2016-08-21 NOTE — Patient Instructions (Signed)
Plan:  Aim for 2 Carb Choices per meal (30 grams) +/- 1 either way  Aim for 0-1 Carbs per snack if hungry  Include protein in moderation with your meals and snacks Continue reducing your sodium intake to help with water retention and blood pressure Consider with your activity level by going back to the [place] to walk and doing Arm Chair exercises during commercials on tv  Consider recording your BG's in the Log Book and put a star every time you exercise Continue checking BG at alternate times per day such as before breakfast and supper or bedtime Continue taking medication Lantus at the same time each day, you have chosen 9 PM to help your sugars be more consistent. Continue taking Novolog (or Regular) before each meal instead of once a day

## 2016-08-26 NOTE — Progress Notes (Signed)
Diabetes Self-Management Education  Visit Type:     Appt. Start Time: 0730 Appt. End Time: 0800  08/21/2016  [name], identified by name and date of birth, is a 66 y.o. female with a diagnosis of Diabetes:  . She is here for follow up education for diabetes and weight loss. She is pleased with her weight loss of 12 pounds since our last visit on 06/13/2016. She states she is eating more vegetables including frozen instead of as many canned, and she has increased her water intake. She states she did run out of insulin so her BG increased until she could get more. Currently she states her BG are running 200-250 mg/dl.   ASSESSMENT  Height 5\' 4"  (1.626 m), weight 274 lb (124.3 kg). Body mass index is 47.03 kg/m.   Learning Objective:  Patient will have a greater understanding of diabetes self-management. Patient education plan is to attend individual and/or group sessions per assessed needs and concerns.  Concentrated on increasing her activity level as well as checking her BG after exercise so she can see the benefit. Complimented her on her improved eating habits and water intake  Plan:   Patient Instructions  Plan:  Aim for 2 Carb Choices per meal (30 grams) +/- 1 either way  Aim for 0-1 Carbs per snack if hungry  Include protein in moderation with your meals and snacks Continue reducing your sodium intake to help with water retention and blood pressure Consider with your activity level by going back to the [place] to walk and doing Arm Chair exercises during commercials on tv  Consider recording your BG's in the Log Book and put a star every time you exercise Continue checking BG at alternate times per day such as before breakfast and supper or bedtime Continue taking medication Lantus at the same time each day, you have chosen 9 PM to help your sugars be more consistent. Continue taking Novolog (or Regular) before each meal instead of once a day  Expected Outcomes:    Patient is still motivated, positive outcomes expected  Education material provided: Arm Chair Exercise handout  If problems or questions, patient to contact team via:  Phone and Email  Future DSME appointment: -   2 months

## 2016-09-01 DIAGNOSIS — L84 Corns and callosities: Secondary | ICD-10-CM | POA: Diagnosis not present

## 2016-09-01 DIAGNOSIS — L603 Nail dystrophy: Secondary | ICD-10-CM | POA: Diagnosis not present

## 2016-09-01 DIAGNOSIS — E1151 Type 2 diabetes mellitus with diabetic peripheral angiopathy without gangrene: Secondary | ICD-10-CM | POA: Diagnosis not present

## 2016-09-01 DIAGNOSIS — I739 Peripheral vascular disease, unspecified: Secondary | ICD-10-CM | POA: Diagnosis not present

## 2016-09-03 ENCOUNTER — Encounter: Payer: Self-pay | Admitting: Neurology

## 2016-09-03 ENCOUNTER — Ambulatory Visit (INDEPENDENT_AMBULATORY_CARE_PROVIDER_SITE_OTHER): Payer: Medicare Other | Admitting: Neurology

## 2016-09-03 VITALS — BP 148/86 | HR 104 | Resp 22 | Ht 65.0 in | Wt 275.0 lb

## 2016-09-03 DIAGNOSIS — R0902 Hypoxemia: Secondary | ICD-10-CM

## 2016-09-03 DIAGNOSIS — E0800 Diabetes mellitus due to underlying condition with hyperosmolarity without nonketotic hyperglycemic-hyperosmolar coma (NKHHC): Secondary | ICD-10-CM | POA: Diagnosis not present

## 2016-09-03 DIAGNOSIS — F3132 Bipolar disorder, current episode depressed, moderate: Secondary | ICD-10-CM | POA: Diagnosis not present

## 2016-09-03 DIAGNOSIS — N184 Chronic kidney disease, stage 4 (severe): Secondary | ICD-10-CM

## 2016-09-03 DIAGNOSIS — Z9981 Dependence on supplemental oxygen: Secondary | ICD-10-CM

## 2016-09-03 DIAGNOSIS — G4733 Obstructive sleep apnea (adult) (pediatric): Secondary | ICD-10-CM | POA: Diagnosis not present

## 2016-09-03 DIAGNOSIS — Z794 Long term (current) use of insulin: Secondary | ICD-10-CM | POA: Diagnosis not present

## 2016-09-03 DIAGNOSIS — I10 Essential (primary) hypertension: Secondary | ICD-10-CM

## 2016-09-03 DIAGNOSIS — E669 Obesity, unspecified: Secondary | ICD-10-CM

## 2016-09-03 MED ORDER — FLUNISOLIDE 25 MCG/ACT (0.025%) NA SOLN: 2.0000 | Freq: Two times a day (BID) | NASAL | 0 refills | Status: DC

## 2016-09-03 NOTE — Progress Notes (Signed)
SLEEP MEDICINE CLINIC   Provider:  Melvyn Gross, M D  Referring Provider: Dorothyann Peng, MD Primary Care Physician:  Kimberly Aliment, MD  Chief Complaint  Patient presents with  . Sleep Consult    Rm 11. Patient had a sleep study in 2008, never placed on CPAP. Wears O2 at Gross PRN, Patient has trouble falling and staying asleep, snores, witnessed apnea, wakes up feeling tired, daytime fatigue, takes naps.     HPI:  Kimberly Gross is a 66 y.o. female , seen here as a referral  from Dr. Allyne Gross for a sleep consultation.  This patient has seen a neurologist for tremor evaluation 2 years ago, Dr. Delia Gross.    Dear Kimberly Gross,  Thank You for your referral - as you know ,Kimberly Gross is an African-American right-handed female and formerly a patient of Dr. Petra Gross. She is now followed by you, Kimberly Galas, NP.,  and Kimberly Hatchet, MD. She presents today with a suspicion that she may have sleep apnea, shared by her primary care providers. She has a history of hypercholesterolemia, diabetes, hypertension, depression and Depakote induced  tremors.  Kimberly Gross also is considered morbidly obese and insulin-dependent; she's using Lantus 40 units at Gross and NovoLog insulin 10 units at each meal.  Dx of hypertension  and HTN related heart disease , osteoarthritis , a more inactive lifestyle and CKD grade 4 ( quoted from [facility] chart, is followed by Dr Kimberly Needle). She's also on furosemide, and amlodipine- and she uses Depakote for depression , takes clonazepam for tremor and anxiety ( Dr Kimberly Gross) .  She had previously been evaluated for sleep apnea and a nocturnal polysomnogram at [facility] was performed on 08/16/2006 at the time she had very mild obstructive apnea with an AHI of 7.7 per hour the events were not positional but there was a strong REM sleep accentuation. During dream sleep her AHI rose to 37.3/hr. He was also moderately loud snoring, and had a nadir at 78% SPO2  and the mean oxygen saturation of 98%. The desaturation was strongly related to the apnea. She soon after he received oxygen to be used when necessary at home. I do not have documentation as to what led to the oxygen support  but CPAP was not used. CPAP was not ever attempted.    Sleep habits are as follows:  The patient lives with her husband, who has moved to another room as he cannot tolerate her loud snoring, has been  witnessing apneas and complaints of the oxygen machine making noises.  " I like Kimberly Gross '. In other words; she loves to watch TV late in her bedroom. Usually, she is asleep at 1 AM. Her bedroom is neither quiet nor dark, and it's temperature is 60 degrees.  She has 2 bathroom breaks each Gross , rarely 3. She sleeps on her side, using 3 pillows. She will stay asleep for 2-3 hours and has the first bathroom break at 3-4 AM. another at 5 AM. She finds herself still on the side.  She snores herself awake, but she and she denies associated diaphoresis, publications, pain, choking, vertigo or other lightheadedness. This happens most frequently if she took a medication for cold symptom relief, and when her nose is so congested that she has to breathe through her mouth. She wakes with headaches and dry mouth at 5 AM - and promptly switches the TV on again. She watches an exercise program for stretching,  and chair gymnastics. She feels not refreshed or restored.  This limits her to 5 hours maximal sleep time at Gross.  She takes daytime naps, with TV in the back ground . By 10 AM , she is dozing.  By 12 o'clock, she watches the next batch of Soap operas. She will leave the house at 2 PM with her husband, [facility] , 1 mile form her home. Not every day does she leave her home.   Sleep medical history and family sleep history:    Social history: married, husband is self employed , she is retired from [employer], community" on disability for 10 years. She was  a shift worker, swing shift .  Caffeine : coffee 2 a day in AM, Soda : one a day, Ice tea : 1 a day, at lunch. Tobacco use: none. Etoh ; none  The couple has 2 daughters, 22 and 79 years old. One is working in Designer, fashion/clothing, another in a call center and in a nursing home kitchen..    Review of Systems: Out of a complete 14 system review, the patient complains of only the following symptoms, and all other reviewed systems are negative. Kimberly Gross feels tired, fatigued, daytime sleepy. She naps during the day she does not sleep as much at Gross, her sleep hygiene needs improvement, she endorsed the geriatric depression score at 11 points out of 15 which is indicative of depression. She is treated for depression by Dr. Donell Gross , her fatigue severity score was endorsed at 21 points the Epworth sleepiness score was endorsed at 12 points.     Social History   Social History  . Marital status: Married    Spouse name: N/A  . Number of children: 2  . Years of education: N/A   Occupational History  . Not on file.   Social History Main Topics  . Smoking status: Never Smoker  . Smokeless tobacco: Never Used  . Alcohol use No  . Drug use: No  . Sexual activity: Not on file   Other Topics Concern  . Not on file   Social History Narrative   Drinks 1-2 caffeine drinks a day     Family History  Problem Relation Age of Onset  . Hypertension Mother   . Heart attack Father     Past Medical History:  Diagnosis Date  . Anemia   . Anxiety   . Bipolar 1 disorder (HCC)   . Depression   . Diabetes mellitus without complication (HCC)   . Hyperlipemia   . Hypertension   . Occasional tremors     Past Surgical History:  Procedure Laterality Date  . VAGINAL HYSTERECTOMY      Current Outpatient Prescriptions  Medication Sig Dispense Refill  . ACCU-CHEK SOFTCLIX LANCETS lancets     . Alcohol Swabs (B-D SINGLE USE SWABS REGULAR) PADS     . amLODipine (NORVASC) 5 MG tablet take 1 tablet by  oral route  every day    . aspirin 81 MG tablet Take 81 mg by mouth once.    Marland Kitchen atorvastatin (LIPITOR) 20 MG tablet take 1 tablet by oral route  every day    . Blood Glucose Monitoring Suppl (ACCU-CHEK AVIVA PLUS) W/DEVICE KIT     . clonazePAM (KLONOPIN) 0.5 MG tablet Take 1 tablet (0.5 mg total) by mouth 2 (two) times daily. 30 tablet 0  . divalproex (DEPAKOTE) 500 MG DR tablet     . fenofibrate 160 MG  tablet     . furosemide (LASIX) 40 MG tablet     . insulin glargine (LANTUS) 100 UNIT/ML injection Inject 50 Units into the skin at bedtime.    . Liraglutide (VICTOZA) 18 MG/3ML SOPN Inject 10 mg into the skin daily.    . Multiple Vitamins-Minerals (MULTIVITAMIN ADULT PO) Use as directed 1 tablet in the mouth or throat daily.    Marland Kitchen NOVOLOG PENFILL cartridge     . triamcinolone lotion (KENALOG) 0.1 % APP EXT TO THE SCALP BID SPARINGLY FOR ITCHING OR IRRITATION  2  . Vitamin D, Ergocalciferol, (DRISDOL) 50000 UNITS CAPS capsule     . traZODone (DESYREL) 100 MG tablet      No current facility-administered medications for this visit.     Allergies as of 09/03/2016 - Review Complete 09/03/2016  Allergen Reaction Noted  . Chlorpromazine Rash 05/08/2015    Vitals: BP (!) 148/86   Pulse (!) 104   Resp (!) 22   Ht '5\' 5"'$  (1.651 m)   Wt 275 lb (124.7 kg)   BMI 45.76 kg/m  Last Weight:  Wt Readings from Last 1 Encounters:  09/03/16 275 lb (124.7 kg)   URK:YHCW mass index is 45.76 kg/m.     Last Height:   Ht Readings from Last 1 Encounters:  09/03/16 '5\' 5"'$  (1.651 m)    Physical exam:  General: The patient is awake, alert and appears not in acute distress. The patient is well groomed. Head: Normocephalic, atraumatic. Neck is supple. Mallampati 5 ,  neck circumference:19. Nasal airflow congested . Retrognathia is seen. Poor dental status.  Cardiovascular:  Regular rate and rhythm, without  murmurs or carotid bruit, and without distended neck veins. Respiratory: Lungs are clear to  auscultation. Skin:  Without evidence of edema, or rash Trunk: BMI is super obese. The patient's posture is stooped   Neurologic exam : The patient is awake and alert, oriented to place and time.  Attention span & concentration ability appears normal. Speech is fluent,  without dysarthria, dysphonia or aphasia. Mood and affect are appropriate. Cranial nerves: Pupils are equal and briskly reactive to light. Funduscopic exam without  evidence of pallor or edema.  Extraocular movements  in vertical and horizontal planes intact and without nystagmus. Visual fields by finger perimetry are intact. Hearing to finger rub intact. Facial sensation intact to fine touch. Facial motor strength is symmetric and tongue and uvula move midline. Shoulder shrug was symmetrical.  Motor exam:   Normal tone, muscle bulk and symmetric strength in all extremities.  Sensory:  Fine touch, pinprick and vibration were intact,  Coordination: Rapid alternating movements in the fingers/hands was normal. Finger-to-nose maneuver  normal without evidence of ataxia, dysmetria or tremor. Gait and station: Patient walks without assistive device and is able unassisted to climb up to the exam table. Strength within normal limits. Stance is stable and normal. Tandem gait is unfragmented. Turns with 4 Steps. Romberg testing is negative. Deep tendon reflexes: in the  upper and lower extremities are attenuated . Babinski maneuver response is downgoing.  The patient was advised of the nature of the diagnosed sleep disorder , the treatment options and risks for general a health and wellness arising from not treating the condition.  I spent more than 60 minutes of face to face time with the patient. Greater than 50% of time was spent in counseling and coordination of care. We have discussed the diagnosis and differential and I answered the patient's questions.  Dear Doreene Burke, Thank you for referring Kimberly Gross my way.  As you know she  has multiple risk factors for sleep apnea and has been clearly sleep apnea witnessed. Her sleep study from 6 years ago documented REM dependent sleep apnea which cannot be treated with a dental device and she had significant enough hypoxemia to allow oxygen supplementation at home.  Based on these results are would think that she has to be on CPAP. I will order a repeat sleep study to make sure that apnea is present and that it is obstructive apnea. He can hopefully use the same Gross to titrate her to CPAP and see if she still needs additional oxygen once positive airway pressure has been implemented. Her current dental state also would not allow for a dental mandibular advancement device.  Assessment:  After physical and neurologic examination, review of laboratory studies,  Personal review of imaging studies, reports of other /same  Imaging studies ,  Results of polysomnography/ neurophysiology testing and pre-existing records as far as provided in visit., my assessment is   1)  super obesity, diabetes, hypertension, and chronic kidney disease grade 4, but no history of atrial fibrillation or coronary artery disease. The patient is at high risk of developing the 2 conditions if apnea is present and remains untreated. I will ask her urgently to schedule a split-Gross polysomnography. In addition there is a degree of fatigue and sleepiness that is multifactorial. By her nocturnal sleep may not be the best in quality and duration, it is partially the lack of sleep hygiene that doesn't allow her to get more sleep at Gross. She naps during the day and she also doesn't leaves the house and exposes herself to daytime light. A contributing factor is her depression which still is clinically evident. I am not worried at all about the very mild tremor she displays, and attributed this to the valproate acid medication. I explained to her some basic sleep hygiene rules, eliminating the TV from the bedroom, watching TV  in another room and leaving the bedroom reserved for nocturnal sleep. We also went through elimination of caffeine later than lunchtime, eliminating back lit screens from the bedroom, and advancing her bedtime to before midnight. I would like her to not nap in daytime or limit her daytime naps to 30 minutes duration.     Plan:  Treatment plan and additional workup : Rv after SPLIT      Asencion Partridge Needham Biggins MD  09/03/2016   CC: [place] Chard, Md [address] [place], [place] [id number]

## 2016-09-03 NOTE — Addendum Note (Signed)
Addended by: Larey Seat on: 09/03/2016 08:56 AM   Modules accepted: Orders

## 2016-09-04 DIAGNOSIS — N261 Atrophy of kidney (terminal): Secondary | ICD-10-CM | POA: Diagnosis not present

## 2016-09-04 DIAGNOSIS — I10 Essential (primary) hypertension: Secondary | ICD-10-CM | POA: Diagnosis not present

## 2016-09-04 DIAGNOSIS — N184 Chronic kidney disease, stage 4 (severe): Secondary | ICD-10-CM | POA: Diagnosis not present

## 2016-09-04 DIAGNOSIS — E119 Type 2 diabetes mellitus without complications: Secondary | ICD-10-CM | POA: Diagnosis not present

## 2016-09-04 DIAGNOSIS — Z6841 Body Mass Index (BMI) 40.0 and over, adult: Secondary | ICD-10-CM | POA: Diagnosis not present

## 2016-09-04 DIAGNOSIS — G4730 Sleep apnea, unspecified: Secondary | ICD-10-CM | POA: Diagnosis not present

## 2016-09-09 ENCOUNTER — Ambulatory Visit (INDEPENDENT_AMBULATORY_CARE_PROVIDER_SITE_OTHER): Payer: Medicare Other | Admitting: Neurology

## 2016-09-09 DIAGNOSIS — E669 Obesity, unspecified: Secondary | ICD-10-CM

## 2016-09-09 DIAGNOSIS — G4733 Obstructive sleep apnea (adult) (pediatric): Secondary | ICD-10-CM | POA: Diagnosis not present

## 2016-09-09 DIAGNOSIS — Z794 Long term (current) use of insulin: Secondary | ICD-10-CM

## 2016-09-09 DIAGNOSIS — E0800 Diabetes mellitus due to underlying condition with hyperosmolarity without nonketotic hyperglycemic-hyperosmolar coma (NKHHC): Secondary | ICD-10-CM

## 2016-09-09 DIAGNOSIS — F3132 Bipolar disorder, current episode depressed, moderate: Secondary | ICD-10-CM

## 2016-09-09 DIAGNOSIS — N184 Chronic kidney disease, stage 4 (severe): Secondary | ICD-10-CM

## 2016-09-09 DIAGNOSIS — I10 Essential (primary) hypertension: Secondary | ICD-10-CM

## 2016-09-10 ENCOUNTER — Ambulatory Visit: Payer: PPO | Admitting: Podiatry

## 2016-09-13 ENCOUNTER — Telehealth: Payer: Self-pay | Admitting: Neurology

## 2016-09-13 DIAGNOSIS — G4734 Idiopathic sleep related nonobstructive alveolar hypoventilation: Secondary | ICD-10-CM

## 2016-09-13 DIAGNOSIS — G4733 Obstructive sleep apnea (adult) (pediatric): Secondary | ICD-10-CM

## 2016-09-13 DIAGNOSIS — E669 Obesity, unspecified: Secondary | ICD-10-CM

## 2016-09-13 NOTE — Telephone Encounter (Signed)
Moderate sleep apnea, obstructive, with an AHI of 22.1/hr., REM AHI of 73.3/hr. and supine AHI of 26.5.  This apnea is associated with hypoxemia, probably hypercapnia, too.  The Oxygen nadir was 72% and desaturation time was 69 minutes. Nocturia was noted, but PLMs were not seen.    RECOMMENDATIONS:  1. Advise full-night, attended, CPAP titration study to optimize therapy. Hypercapnia evaluation should be considered, oxygen supplementation only if patient cannot tolerate CPAP or CPAP fails to increase the SpO2 Nadir.     Please ask Tech to try Co2 measures for about one hour- titrate to  O2 if CPAP is not working , as above! CD

## 2016-09-13 NOTE — Procedures (Signed)
PATIENT'S NAME:  Kimberly Gross, Fuerte DOB:      March 29, 1951      MR#:    [id number]     DATE OF RECORDING: 09/09/2016 REFERRING M.D.:  Glendale Chard, MD; cc: Erling Cruz, MD; Norma Fredrickson, MD  Study Performed:   Baseline Polysomnogram HISTORY:  Mrs. Naraly Fritcher is an African-American right-handed female and followed by Darleen Crocker, NP., and Bryon Lions, MD. She presents today with a suspicion of having sleep apnea, shared by her primary care providers. She has a history of hypercholesterolemia, hypertension, depression and Depakote induced tremors.  Mrs. Odonell also is considered super obese and an insulin-dependent diabetic, has hypertension and related heart disease, osteoarthritis, has CKD grade 4 (quoted from Dr. Erling Cruz). She's also on furosemide, and amlodipine- and she uses Depakote for depression, takes clonazepam for tremor and anxiety (Dr. Casimiro Needle) . Her inactive lifestyle lacks structure and sleep hygiene. She spends many hours 24/7 in bed in front of the TV.   She had previously been evaluated for sleep apnea in a nocturnal polysomnogram at [facility], performed on 08/16/2006. At the time, she had very mild obstructive apnea with an AHI of 7.7 per hour and the events were not positional but strongly REM sleep accentuated. REM sleep AHI rose to 37.3/hr, and had a SpO2 nadir at 78%. She is moderately loud snoring. Soon after the PSG , she received oxygen to be used when necessary at home. I do not have documentation as to what led to the oxygen support but CPAP was not used.   Excessive daytime sleepiness, Anemia, anxiety, bipolar depression, insulin dependent diabetes, diabetic and hypertensive renal disease, hyperlipidemia, hypertension and medication induced tremors. She is taking benzodiazepine.  The patient endorsed the Epworth Sleepiness Scale at 12/24 points.  The patient's weight 275 pounds with a height of 65 (inches), resulting in a BMI of 45.9 kg/m2.The patient's neck  circumference measured 19 inches.  CURRENT MEDICATIONS: Amlodipine, Aspirin, Atorvastatin, Clonazepam, Divalproex, Fenofibrate, Furosemide, Liraglutide, Multi-Vitamins, Novolog and triamcinolone   PROCEDURE:  This is a multichannel digital polysomnogram utilizing the Somnostar 11.2 system.  Electrodes and sensors were applied and monitored per AASM Specifications.   EEG, EOG, Chin and Limb EMG, were sampled at 200 Hz.  ECG, Snore and Nasal Pressure, Thermal Airflow, Respiratory Effort, CPAP Flow and Pressure, Oximetry was sampled at 50 Hz. Digital video and audio were recorded.      BASELINE STUDY Lights Out was at 22:35 and Lights On at 05:06.  Total recording time (TRT) was 391.5 minutes, with a total sleep time (TST) of 323 minutes.  The patient's sleep latency was 0 minutes.  REM latency was 82.5 minutes.  The sleep efficiency was 82.5 %.     SLEEP ARCHITECTURE: WASO (Wake after sleep onset) was 56 minutes.  There were 7 minutes in Stage N1, 250.5 minutes Stage N2, 0 minutes Stage N3 and 65.5 minutes in Stage REM.  The percentage of Stage N1 was 2.2%, Stage N2 was 77.6%, Stage N3 was 0% and Stage R (REM sleep) was 20.3%.   RESPIRATORY ANALYSIS:  There were a total of 119 respiratory events:  22 obstructive apneas, 0 central apneas and 0 mixed apneas with a total of 22 apneas and an apnea index (AI) of 4.1 /hour. There were 97 hypopneas with a hypopnea index of 18.1 /hr. The patient also had 0 respiratory event related arousals (RERAs).    The total APNEA/HYPOPNEA INDEX (AHI) was 22.1/hour and the total RESPIRATORY DISTURBANCE INDEX was  22.1 /hour.  80 events occurred in REM sleep and 66 events in NREM. The REM AHI was 73.3 /hour, versus a non-REM AHI of 9.1. The patient spent 43 minutes of total sleep time in the supine position and 280 minutes in non-supine. The supine AHI was 26.5 versus a non-supine AHI of 21.4.  OXYGEN SATURATION & C02:  The Wake baseline 02 saturation was 72% at nadir. Time  spent below 89% saturation equaled 69 minutes. Average End [place]idal CO2 during sleep was not measured.     PERIODIC LIMB MOVEMENTS:   The patient had a total of 6 Periodic Limb Movements. The PLM Arousal index was 0/hour. The arousals were noted as: 18 were spontaneous, 0 were associated with PLMs, and 119 were associated with respiratory events. Audio and video analysis did not show any abnormal or unusual movements, behaviors, phonations or vocalizations.   The patient took 2 bathroom breaks. Loud Snoring was noted. EKG was in keeping with normal sinus rhythm (NSR).  IMPRESSION: Moderate sleep apnea, obstructive, with an AHI of 22.1/hr., REM AHI of 73.3/hr. and supine AHI of 26.5.  This apnea is associated with hypoxemia, probably hypercapnia, too.  The Oxygen nadir was 72% and desaturation time was 69 minutes. Nocturia was noted, but PLMs were not seen.    RECOMMENDATIONS:  1. Advise full-night, attended, CPAP titration study to optimize therapy.   2. Note that patients with congestive heart failure (CHF), significant lung disease such as chronic obstructive pulmonary disease (COPD), patients expected to have nocturnal arterial oxyhemoglobin desaturation due to conditions other than OSA (e.g. obesity hypoventilation syndrome), patients who do not snore (either naturally or as a result of palate surgery), and patients who have central sleep apnea syndromes are not currently candidates for APAP titration or treatment.   3. Avoid sedative-hypnotics which may worsen sleep apnea, alcohol and tobacco (as applicable). 4. Advise to lose weight, by diet and exercise, if not contraindicated (BMI 46). A medical weight loss program may be helpful. 5. Further information regarding OSA may be obtained from [employer] (www.sleepfoundation.org) or American Sleep Apnea Association (www.sleepapnea.org). 6. Avoid caffeine-containing beverages and chocolate, if currently consumed. 7. A follow up  appointment will be scheduled in the Sleep Clinic at [facility]. The referring provider will be notified of the results.     I certify that I have reviewed the entire raw data recording prior to the issuance of this report in accordance with the Standards of Accreditation of the American Academy of Sleep Medicine (AASM)      Larey Seat, MD  09-12-2016  Diplomat, American Board of Psychiatry and Neurology  Diplomat, American Board of [place] Director, [place] [facility] at [employer]

## 2016-09-15 DIAGNOSIS — M792 Neuralgia and neuritis, unspecified: Secondary | ICD-10-CM | POA: Diagnosis not present

## 2016-09-15 DIAGNOSIS — G609 Hereditary and idiopathic neuropathy, unspecified: Secondary | ICD-10-CM | POA: Diagnosis not present

## 2016-09-17 ENCOUNTER — Telehealth: Payer: Self-pay

## 2016-09-17 NOTE — Telephone Encounter (Signed)
I called patient and she is aware of results and recommendations. She is willing to proceed with titration study. I advised her that our sleep lab will call to schedule.

## 2016-09-17 NOTE — Telephone Encounter (Signed)
-----   Message from Larey Seat, MD sent at 09/13/2016 11:31 AM EST ----- The patient had a total of 6 Periodic Limb Movements. The PLM Arousal index was 0/hour. The arousals were noted as: 18 were spontaneous, 0 were associated with PLMs, and 119 were associated with respiratory events. Audio and video analysis did not show any abnormal or unusual movements, behaviors, phonations or vocalizations.   The patient took 2 bathroom breaks. Loud Snoring was noted. EKG was in keeping with normal sinus rhythm (NSR).  IMPRESSION: Moderate sleep apnea, obstructive, with an AHI of 22.1/hr., REM AHI of 73.3/hr. and supine AHI of 26.5.  This apnea is associated with hypoxemia, probably hypercapnia, too.  The Oxygen nadir was 72% and desaturation time was 69 minutes. Nocturia was noted, but PLMs were not seen.    RECOMMENDATIONS:  1. Advise full-night, attended, CPAP titration study to optimize therapy. Hypercapnia evaluation should be considered, oxygen supplementation only if patient cannot tolerate CPAP or CPAP fails to increase the SpO2 Nadir.

## 2016-09-17 NOTE — Telephone Encounter (Signed)
I called to give results no answer and no vm. Study sent to PCP, Erling Cruz MD, Norma Fredrickson MD.

## 2016-10-01 ENCOUNTER — Ambulatory Visit (INDEPENDENT_AMBULATORY_CARE_PROVIDER_SITE_OTHER): Payer: Medicare Other | Admitting: Neurology

## 2016-10-01 DIAGNOSIS — G4733 Obstructive sleep apnea (adult) (pediatric): Secondary | ICD-10-CM | POA: Diagnosis not present

## 2016-10-01 DIAGNOSIS — G4734 Idiopathic sleep related nonobstructive alveolar hypoventilation: Secondary | ICD-10-CM

## 2016-10-01 DIAGNOSIS — E669 Obesity, unspecified: Secondary | ICD-10-CM

## 2016-10-06 DIAGNOSIS — M792 Neuralgia and neuritis, unspecified: Secondary | ICD-10-CM | POA: Diagnosis not present

## 2016-10-07 NOTE — Procedures (Signed)
PATIENT'S NAME:  Aldean, Suddeth DOB:      June 16, 1951      MR#:    [id number]     DATE OF RECORDING: 10/01/2016 REFERRING M.D.:  Glendale Chard MD Study Performed:   CPAP and BiPAP Titration HISTORY: This 66 year old superobese female Patient returns for a full titration study following a PSG on  09-09-2016 , which revealed AHI of 22.1, supine AHI of 26.5/hr., REM AHI of 73.3 /hr. and SpO2 nadir of 72% with over 60 minutes desaturation time.   CURRENT MEDICATIONS: Amlodipine, Aspirin, Atorvastatin, Clonazepam, Divalproex, Fenofibrate, Furosemide, Liraglutide, Multi-Vitamins, Novolog and triamcinolone   PROCEDURE:  This is a multichannel digital polysomnogram utilizing the SomnoStar 11.2 system.  Electrodes and sensors were applied and monitored per AASM Specifications.   EEG, EOG, Chin and Limb EMG, were sampled at 200 Hz.  ECG, Snore and Nasal Pressure, Thermal Airflow, Respiratory Effort, CPAP Flow and Pressure, Oximetry was sampled at 50 Hz. Digital video and audio were recorded.      CPAP was initiated at 5 cmH20 with heated humidity per AASM split night standards and pressure was advanced to 15 cm water  without a reduction in AHI ( AHI was 70!) , therefor changing to BiPAP at 17/13cmH20 because of remaining AHI and Spo2 desaturations.   At a BiPAP pressure of 17/13 cmH20, there was a reduction of the AHI to 0 with improvement of obstructive sleep apnea, the patient slept for 74 minutes and the SpO2 nadir rose to 88% .    Lights Out was at 22:34 and Lights On at 05:00. Total recording time (TRT) was 386 minutes, with a total sleep time (TST) of 355.5 minutes. The patient's sleep latency was 8 minutes with 0 minutes of wake time after sleep onset. REM latency was 117 minutes.  The sleep efficiency was 92.1 %.    SLEEP ARCHITECTURE: WASO (Wake after sleep onset) was 22.5 minutes.  There were 6.5 minutes in Stage N1, 169 minutes Stage N2, 161.5 minutes Stage N3 and 18.5 minutes in Stage REM.  The  percentage of Stage N1 was 1.8%, Stage N2 was 47.5%, Stage N3 was 45.4% and Stage R (REM sleep) was 5.2%.   RESPIRATORY ANALYSIS:  There were 71 respiratory events: 3 obstructive apneas, 0 central apneas and 68 hypopneas with 1 respiratory event related arousal (RERAs).     The total APNEA/HYPOPNEA INDEX (AHI) was 12.0 /hour and the total RESPIRATORY DISTURBANCE INDEX was 12.2 .hour  0 events occurred in REM sleep and 71 events in NREM. The REM AHI was 0 /hour versus a non-REM AHI of 12.6 /hour.  The patient spent 1.5 minutes of total sleep time in the supine position and 354 minutes in non-supine.  The supine AHI was 80.0, versus a non-supine AHI of 11.7.  OXYGEN SATURATION & C02:  The baseline 02 saturation was 92%, with the lowest being 78%. Time spent below 89% saturation still equaled 55 minutes. PERIODIC LIMB MOVEMENTS:   The patient had a total of 0 Periodic Limb Movements.  The arousals were noted as: 75 were spontaneous, 0 were associated with PLMs, and 34 were associated with respiratory events.  Audio and video analysis did not show any abnormal or unusual movements, behaviors, phonations or vocalizations.   The patient took one bathroom break. Snoring was noted until PAP pressure exceeded 11 cm water. EKG was in keeping with normal sinus rhythm (NSR).   DIAGNOSIS:  Moderate severe obstructive sleep apnea with strong supine sleep accentuation.  Prolonged time of total sleep hypoxemia.  Apnea did not respond to CPAP. Hypoxemia did not improve under CPAP.  BiPAP was effective and well tolerated. At a BiPAP pressure of 17/13 cmH20, there was a reduction of the AHI to 0 with improvement of obstructive sleep apnea, the patient slept for 74 minutes and the SpO2 nadir rose to 88%.     PLANS/RECOMMENDATIONS:  BiPAP at 17 over 13 cm water with heated humidity and heated tubing.  The patient was fitted with a ResMed Airfit P10 size small pillows apparatus. Further recommendations: a. Weight  loss. b. Avoidance of medications with muscle relaxant properties. c. Avoidance of ingestion of alcohol prior to sleeping. d. Avoiding sleeping in the supine position (on one's back). e. Avoiding driving when sleepy. 2. Positional therapy:  This study indicates that patient has significantly more apneas in the supine position.  It is recommended that the patient use a device which encourages sleep in the non-supine position (e.g., tennis ball-in-back method, using fanny pack or similar device to position elastic ball in small of back during sleep, in order to encourage sleep in the non-supine position).  A follow up appointment will be scheduled in the Sleep Clinic at [facility].   Please call [phone] with any questions.    I certify that I have reviewed the entire raw data recording prior to the issuance of this report in accordance with the Standards of Accreditation of the American Academy of Sleep Medicine (AASM)  Larey Seat, M.D.  10-07-2016  Diplomat, American Board of Psychiatry and Neurology  Diplomat, [place] of Sleep Medicine Medical Director, [place] [facility] at [facility]

## 2016-10-07 NOTE — Addendum Note (Signed)
Addended by: Larey Seat on: 10/07/2016 05:19 PM   Modules accepted: Orders

## 2016-10-08 ENCOUNTER — Telehealth: Payer: Self-pay

## 2016-10-08 NOTE — Telephone Encounter (Signed)
I called pt to discuss her sleep study results. No answer, left a message asking her to call me back. 

## 2016-10-08 NOTE — Telephone Encounter (Signed)
-----   Message from Larey Seat, MD sent at 10/07/2016  5:19 PM EDT ----- DIAGNOSIS:  Moderate severe obstructive sleep apnea with strong supine sleep accentuation.  Prolonged time of total sleep hypoxemia.  Apnea did not respond to CPAP. Hypoxemia did not improve under CPAP.  BiPAP was effective and well tolerated. At a BiPAP pressure of 17/13 cmH20, there was a reduction of the AHI to 0 with improvement of obstructive sleep apnea, the patient slept for 74 minutes and the SpO2 nadir rose to 88%.     PLANS/RECOMMENDATIONS:  BiPAP at 17 over 13 cm water with heated humidity and heated tubing.  The patient was fitted with a ResMed Airfit P10 size small pillows apparatus. Further recommendations: a. Weight loss. b. Avoidance of medications with muscle relaxant properties. c. Avoidance of ingestion of alcohol prior to sleeping. d. Avoiding sleeping in the supine position (on one's back).

## 2016-10-09 NOTE — Telephone Encounter (Signed)
I called pt. I advised her of her sleep study results. Pt is agreeable to starting a bipap machine and says that she uses [facility]. Pt is asking for a copy of this sleep study to be sent to Dr. Karen Chafe office. A follow up appt was made for 01/22/2017 at 9:30am. Pt verbalized understanding of results. Pt had no questions at this time but was encouraged to call back if questions arise.

## 2016-10-13 NOTE — Telephone Encounter (Signed)
Referral for bipap sent to [facility].

## 2016-10-15 NOTE — Telephone Encounter (Signed)
Patient calling stating Kimberly Gross does not cover Medicare.

## 2016-10-15 NOTE — Telephone Encounter (Signed)
I tried to call patient back but no answer and no vm. I will send referral to [facility].

## 2016-10-23 ENCOUNTER — Encounter: Payer: Medicare Other | Attending: Internal Medicine | Admitting: *Deleted

## 2016-10-23 DIAGNOSIS — E119 Type 2 diabetes mellitus without complications: Secondary | ICD-10-CM | POA: Diagnosis not present

## 2016-10-23 DIAGNOSIS — Z713 Dietary counseling and surveillance: Secondary | ICD-10-CM | POA: Diagnosis not present

## 2016-10-23 NOTE — Patient Instructions (Signed)
Plan:  Aim for 2 Carb Choices per meal (30 grams) +/- 1 either way  Aim for 0-1 Carbs per snack if hungry  Include protein in moderation with your meals and snacks Continue reducing your sodium intake to help with water retention and blood pressure Consider with your activity level by going back to the [place] to walk and doing Arm Chair exercises during commercials on tv  Once you learn how to use your new meter, consider recording your BG's in the Log Book and put a star every time you exercise Continue checking BG at alternate times per day such as before breakfast and supper or bedtime Continue taking medication Lantus at the same time each day, you have chosen 9 PM to help your sugars be more consistent. Continue taking Trulicity of once a week on Tuesdays

## 2016-10-23 NOTE — Progress Notes (Signed)
Diabetes Self-Management Education  Visit Type:     Appt. Start Time: 0730 Appt. End Time: 0800  10/23/2016  Kimberly Gross, identified by name and date of birth, is a 66 y.o. female with a diagnosis of Diabetes:  . She is here for follow up education for diabetes and weight loss.   Weight gain of 13 pounds noted. Diet history obtained and looks fairly appropriate. Her activity level has decreased in the past month. She states her meter broke and she doesn't know how to use her new meter, she did not bring it with her today. She states the last time she checked her BG it was in the 160's. She states she remembers the Arm Chair Exercises and sometimes she watches [employer] and Be Fit on [employer] at 5 AM. She states her MD discontinued the Novolog and has not Rx'd Trulicity  ASSESSMENT  Height 5\' 4"  (1.626 m), weight 287 lb (130.2 kg). Body mass index is 49.26 kg/m.   Learning Objective:  Patient will have a greater understanding of diabetes self-management. Patient education plan is to attend individual and/or group sessions per assessed needs and concerns.  Encouraged her to increase  her activity level as well as checking her BG after exercise so she can see the benefit. Reminded her of her improved eating habits and water intake in the past. I provided her with some Sample Meals as a back up plan for her food choices  Plan:   Patient Instructions   Plan:  Aim for 2 Carb Choices per meal (30 grams) +/- 1 either way  Aim for 0-1 Carbs per snack if hungry  Include protein in moderation with your meals and snacks Continue reducing your sodium intake to help with water retention and blood pressure Consider with your activity level by going back to the [place] to walk and doing Arm Chair exercises during commercials on tv  Once you learn how to use your new meter, consider recording your BG's in the Log Book and put a star every time you exercise Continue checking BG at alternate  times per day such as before breakfast and supper or bedtime Continue taking medication Lantus at the same time each day, you have chosen 9 PM to help your sugars be more consistent. Continue taking Trulicity of once a week on Tuesdays  Expected Outcomes:   Patient is still motivated, positive outcomes expected  Education material provided: Sample Meals for 3 days  If problems or questions, patient to contact team via:  Phone and Email  Future DSME appointment: -   2 months

## 2016-10-24 DIAGNOSIS — F250 Schizoaffective disorder, bipolar type: Secondary | ICD-10-CM | POA: Diagnosis not present

## 2016-11-03 DIAGNOSIS — E782 Mixed hyperlipidemia: Secondary | ICD-10-CM | POA: Diagnosis not present

## 2016-11-03 DIAGNOSIS — Z9981 Dependence on supplemental oxygen: Secondary | ICD-10-CM | POA: Diagnosis not present

## 2016-11-03 DIAGNOSIS — I129 Hypertensive chronic kidney disease with stage 1 through stage 4 chronic kidney disease, or unspecified chronic kidney disease: Secondary | ICD-10-CM | POA: Diagnosis not present

## 2016-11-03 DIAGNOSIS — N08 Glomerular disorders in diseases classified elsewhere: Secondary | ICD-10-CM | POA: Diagnosis not present

## 2016-11-03 DIAGNOSIS — G4730 Sleep apnea, unspecified: Secondary | ICD-10-CM | POA: Diagnosis not present

## 2016-11-03 DIAGNOSIS — E1122 Type 2 diabetes mellitus with diabetic chronic kidney disease: Secondary | ICD-10-CM | POA: Diagnosis not present

## 2016-11-03 DIAGNOSIS — N184 Chronic kidney disease, stage 4 (severe): Secondary | ICD-10-CM | POA: Diagnosis not present

## 2016-11-18 DIAGNOSIS — F250 Schizoaffective disorder, bipolar type: Secondary | ICD-10-CM | POA: Diagnosis not present

## 2016-11-19 DIAGNOSIS — Z79899 Other long term (current) drug therapy: Secondary | ICD-10-CM | POA: Diagnosis not present

## 2016-11-21 ENCOUNTER — Other Ambulatory Visit: Payer: Self-pay | Admitting: Internal Medicine

## 2016-11-21 DIAGNOSIS — Z1231 Encounter for screening mammogram for malignant neoplasm of breast: Secondary | ICD-10-CM

## 2016-11-24 ENCOUNTER — Ambulatory Visit
Admission: RE | Admit: 2016-11-24 | Discharge: 2016-11-24 | Disposition: A | Discharge status: Home / Self Care | Payer: Medicare Other | Source: Ambulatory Visit | Attending: Internal Medicine | Admitting: Internal Medicine

## 2016-11-24 DIAGNOSIS — Z1231 Encounter for screening mammogram for malignant neoplasm of breast: Secondary | ICD-10-CM

## 2016-11-25 DIAGNOSIS — E1051 Type 1 diabetes mellitus with diabetic peripheral angiopathy without gangrene: Secondary | ICD-10-CM | POA: Diagnosis not present

## 2016-11-25 DIAGNOSIS — L603 Nail dystrophy: Secondary | ICD-10-CM | POA: Diagnosis not present

## 2016-11-25 DIAGNOSIS — I739 Peripheral vascular disease, unspecified: Secondary | ICD-10-CM | POA: Diagnosis not present

## 2017-01-22 ENCOUNTER — Ambulatory Visit (INDEPENDENT_AMBULATORY_CARE_PROVIDER_SITE_OTHER): Payer: Medicare Other | Admitting: Neurology

## 2017-01-22 ENCOUNTER — Encounter: Payer: Self-pay | Admitting: Neurology

## 2017-01-22 VITALS — BP 132/76 | HR 94 | Ht 63.0 in | Wt 273.0 lb

## 2017-01-22 DIAGNOSIS — G4733 Obstructive sleep apnea (adult) (pediatric): Secondary | ICD-10-CM | POA: Diagnosis not present

## 2017-01-22 DIAGNOSIS — E669 Obesity, unspecified: Secondary | ICD-10-CM

## 2017-01-22 NOTE — Patient Instructions (Signed)
Exercising to Lose Weight Exercising can help you to lose weight. In order to lose weight through exercise, you need to do vigorous-intensity exercise. You can tell that you are exercising with vigorous intensity if you are breathing very hard and fast and cannot hold a conversation while exercising. Moderate-intensity exercise helps to maintain your current weight. You can tell that you are exercising at a moderate level if you have a higher heart rate and faster breathing, but you are still able to hold a conversation. How often should I exercise? Choose an activity that you enjoy and set realistic goals. Your health care provider can help you to make an activity plan that works for you. Exercise regularly as directed by your health care provider. This may include:  Doing resistance training twice each week, such as: ? Push-ups. ? Sit-ups. ? Lifting weights. ? Using resistance bands.  Doing a given intensity of exercise for a given amount of time. Choose from these options: ? 150 minutes of moderate-intensity exercise every week. ? 75 minutes of vigorous-intensity exercise every week. ? A mix of moderate-intensity and vigorous-intensity exercise every week.  Children, pregnant women, people who are out of shape, people who are overweight, and older adults may need to consult a health care provider for individual recommendations. If you have any sort of medical condition, be sure to consult your health care provider before starting a new exercise program. What are some activities that can help me to lose weight?  Walking at a rate of at least 4.5 miles an hour.  Jogging or running at a rate of 5 miles per hour.  Biking at a rate of at least 10 miles per hour.  Lap swimming.  Roller-skating or in-line skating.  Cross-country skiing.  Vigorous competitive sports, such as football, basketball, and soccer.  Jumping rope.  Aerobic dancing. How can I be more active in my day-to-day  activities?  Use the stairs instead of the elevator.  Take a walk during your lunch break.  If you drive, park your car farther away from work or school.  If you take public transportation, get off one stop early and walk the rest of the way.  Make all of your phone calls while standing up and walking around.  Get up, stretch, and walk around every 30 minutes throughout the day. What guidelines should I follow while exercising?  Do not exercise so much that you hurt yourself, feel dizzy, or get very short of breath.  Consult your health care provider prior to starting a new exercise program.  Wear comfortable clothes and shoes with good support.  Drink plenty of water while you exercise to prevent dehydration or heat stroke. Body water is lost during exercise and must be replaced.  Work out until you breathe faster and your heart beats faster. This information is not intended to replace advice given to you by your health care provider. Make sure you discuss any questions you have with your health care provider. Document Released: 08/02/2010 Document Revised: 12/06/2015 Document Reviewed: 12/01/2013 Elsevier Interactive Patient Education  2018 [place]. CPAP and BiPAP Information CPAP and BiPAP are methods of helping a person breathe with the use of air pressure. CPAP stands for "continuous positive airway pressure." BiPAP stands for "bi-level positive airway pressure." In both methods, air is blown through your nose or mouth and into your air passages to help you breathe well. CPAP and BiPAP use different amounts of pressure to blow air. With CPAP, the amount  of pressure stays the same while you breathe in and out. With BiPAP, the amount of pressure is increased when you breathe in (inhale) so that you can take larger breaths. Your health care provider will recommend whether CPAP or BiPAP would be more helpful for you. Why are CPAP and BiPAP treatments used? CPAP or BiPAP can be  helpful if you have:  Sleep apnea.  Chronic obstructive pulmonary disease (COPD).  Heart failure.  Medical conditions that weaken the muscles of the chest including muscular dystrophy, or neurological diseases such as amyotrophic lateral sclerosis (ALS).  Other problems that cause breathing to be weak, abnormal, or difficult.  CPAP is most commonly used for obstructive sleep apnea (OSA) to keep the airways from collapsing when the muscles relax during sleep. How is CPAP or BiPAP administered? Both CPAP and BiPAP are provided by a small machine with a flexible plastic tube that attaches to a plastic mask. You wear the mask. Air is blown through the mask into your nose or mouth. The amount of pressure that is used to blow the air can be adjusted on the machine. Your health care provider will determine the pressure setting that should be used based on your individual needs. When should CPAP or BiPAP be used? In most cases, the mask only needs to be worn during sleep. Generally, the mask needs to be worn throughout the night and during any daytime naps. People with certain medical conditions may also need to wear the mask at other times when they are awake. Follow instructions from your health care provider about when to use the machine. What are some tips for using the mask?  Because the mask needs to be snug, some people feel trapped or closed-in (claustrophobic) when first using the mask. If you feel this way, you may need to get used to the mask. One way to do this is by holding the mask loosely over your nose or mouth and then gradually applying the mask more snugly. You can also gradually increase the amount of time that you use the mask.  Masks are available in various types and sizes. Some fit over your mouth and nose while others fit over just your nose. If your mask does not fit well, talk with your health care provider about getting a different one.  If you are using a mask that fits  over your nose and you tend to breathe through your mouth, a chin strap may be applied to help keep your mouth closed.  The CPAP and BiPAP machines have alarms that may sound if the mask comes off or develops a leak.  If you have trouble with the mask, it is very important that you talk with your health care provider about finding a way to make the mask easier to tolerate. Do not stop using the mask. Stopping the use of the mask could have a negative impact on your health. What are some tips for using the machine?  Place your CPAP or BiPAP machine on a secure table or stand near an electrical outlet.  Know where the on/off switch is located on the machine.  Follow instructions from your health care provider about how to set the pressure on your machine and when you should use it.  Do not eat or drink while the CPAP or BiPAP machine is on. Food or fluids could get pushed into your lungs by the pressure of the CPAP or BiPAP.  Do not smoke. Tobacco smoke residue can damage  the machine.  For home use, CPAP and BiPAP machines can be rented or purchased through home health care companies. Many different brands of machines are available. Renting a machine before purchasing may help you find out which particular machine works well for you.  Keep the CPAP or BiPAP machine and attachments clean. Ask your health care provider for specific instructions. Get help right away if:  You have redness or open areas around your nose or mouth where the mask fits.  You have trouble using the CPAP or BiPAP machine.  You cannot tolerate wearing the CPAP or BiPAP mask.  You have pain, discomfort, and bloating in your abdomen. Summary  CPAP and BiPAP are methods of helping a person breathe with the use of air pressure.  Both CPAP and BiPAP are provided by a small machine with a flexible plastic tube that attaches to a plastic mask.  If you have trouble with the mask, it is very important that you talk with  your health care provider about finding a way to make the mask easier to tolerate. This information is not intended to replace advice given to you by your health care provider. Make sure you discuss any questions you have with your health care provider. Document Released: 03/28/2004 Document Revised: 05/19/2016 Document Reviewed: 05/19/2016 Elsevier Interactive Patient Education  2017 [employer].

## 2017-01-22 NOTE — Progress Notes (Signed)
SLEEP MEDICINE CLINIC   Provider:  Larey Seat, M D  Referring Provider: Glendale Chard, MD Primary Care Physician:  Glendale Chard, MD  Chief Complaint  Patient presents with  . Follow-up    Interval history from 01/22/2017, I have the pleasure of seeing [name] today in a follow-up visit regarding obstructive sleep apnea on CPAP. In my initial visit the patient had presented with an almost 67 year old sleep study but had not let to any treatment. The patient returned for a scheduled CPAP titration after her a polysomnography to place on February 27 and revealed an AHI of 22 per hour in supine sleep of 26.5 apneas per hour in REM sleep 73.3 apneas per hour as well as prolonged hypoxemic periods.This patient could not be treated with a dental device and needed positive airway pressure. She returned on March 27 and was first attempted to be treated with CPAP. There was no reduction in her apnea count on CPAP and once the technologist change to the BiPAP modality at 17/13 cm water there was a significant reduction in apnea 20 and her oxygen nadir rose to 88% SPO2. Remarkable was her sleep efficiency she slept through 92% of the test time. She was prescribed BiPAP therapy and has been able to use BiPAP compliantly. Her compliance is 97% she uses the machine for the half hours each night, her inspiratory pressure is 17 cm expiratory pressure 13 cm water and her residual AHI is 2.2 per hour. She reports she sleeps very good in spite of some air leaks that she experiences. As these air leaks have not let to erroneous apnea count, I will ignore them. I like for her to use the interface but is most comfortable for her.      HPI:  Kimberly Gross is a 66 y.o. female , seen here as a referral  from Dr. Baird Cancer for a sleep consultation.  This patient has seen her primary neurologist for tremor evaluation in  2014, Dr. Antony Contras.   Dear Doreene Burke,  Thank you for your referral - as you know  ,Kimberly Gross is an African-American right-handed female and formerly a patient of Dr. Katherine Roan. She is now followed by you, Darleen Crocker, NP.,  and Bryon Lions, MD. She presents today with a suspicion that she may have sleep apnea, shared by her primary care providers. She has a history of hypercholesterolemia, diabetes, hypertension, depression and Depakote induced  tremors.  [name] also is considered morbidly obese and insulin-dependent; she's using Lantus 40 units at night and NovoLog insulin 10 units at each meal.  Dx of hypertension  and HTN related heart disease , osteoarthritis , a more inactive lifestyle and CKD grade 4 ( quoted from [facility] chart, is followed by Dr Erling Cruz). She's also on furosemide, and amlodipine- and she uses Depakote for depression , takes clonazepam for tremor and anxiety ( Dr Casimiro Needle) .  She had previously been evaluated for sleep apnea and a nocturnal polysomnogram at [facility] was performed on 08/16/2006 at the time she had very mild obstructive apnea with an AHI of 7.7 per hour the events were not positional but there was a strong REM sleep accentuation. During dream sleep her AHI rose to 37.3/hr. He was also moderately loud snoring, and had a nadir at 78% SPO2 and the mean oxygen saturation of 98%. The desaturation was strongly related to the apnea. She soon after he received oxygen to be used when necessary at home. I  do not have documentation as to what led to the oxygen support  but CPAP was not used. CPAP was not ever attempted.    Sleep habits are as follows:The patient lives with her husband, who has moved to another room as he cannot tolerate her loud snoring, has been  witnessing apneas and complaints of the oxygen machine making noises.  " I like Harrison Mons at night '. In other words; she loves to watch TV late in her bedroom. Usually, she is asleep at 1 AM. Her bedroom is neither quiet nor dark, and it's temperature is 60 degrees.    She has 2 bathroom breaks each night , rarely 3. She sleeps on her side, using 3 pillows. She will stay asleep for 2-3 hours and has the first bathroom break at 3-4 AM. another at 5 AM. She finds herself still on the side. She snores herself awake, but she and she denies associated diaphoresis, publications, pain, choking, vertigo or other lightheadedness. This happens most frequently if she took a medication for cold symptom relief, and when her nose is so congested that she has to breathe through her mouth. She wakes with headaches and dry mouth at 5 AM - and promptly switches the TV on again. She watches an exercise program for stretching, and chair gymnastics. She feels not refreshed or restored.  This limits her to 5 hours maximal sleep time at night.  She takes daytime naps, with TV in the back ground . By 10 AM , she is dozing.  By 12 o'clock, she watches the next batch of Soap operas. She will leave the house at 2 PM with her husband, who [facility] , 1 mile form her home. Not every day does she leave her home.   Sleep medical history and family sleep history:  She has 2 brothers, healthy.  No history of TBI, ENT surgery, no Sleep walking.    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 She was a shift worker, swing shift .  Caffeine : coffee 2 a day in AM, Soda : one a day, Ice tea : 1 a day, at lunch. Tobacco use: none. Etoh ; none  The couple has 2 daughters, 39 and 60 years old. One is working in Charity fundraiser, another in a call center and in a nursing home kitchen..    Review of Systems: Out of a complete 14 system review, the patient complains of only the following symptoms, and all other reviewed systems are negative. Her Epworth sleepiness score was endorsed at 13 points her fatigue severity at 49 and her geriatric depression score at 0 out of 15.  She wakes up rested.     Social History    Social History  . Marital status: Married    Spouse name: N/A  . Number of children: 2  . Years of education: N/A   Occupational History  . Not on file.   Social History Main Topics  . Smoking status: Never Smoker  . Smokeless tobacco: Never Used  . Alcohol use No  . Drug use: No  . Sexual activity: Not on file   Other Topics Concern  . Not on file   Social History Narrative   Drinks 1-2 caffeine drinks a day     Family History  Problem Relation Age of Onset  . Hypertension Mother   . Heart attack Father  Past Medical History:  Diagnosis Date  . Anemia   . Anxiety   . Bipolar 1 disorder ([place])   . Depression   . Diabetes mellitus without complication ([place])   . Hyperlipemia   . Hypertension   . Occasional tremors     Past Surgical History:  Procedure Laterality Date  . VAGINAL HYSTERECTOMY      Current Outpatient Prescriptions  Medication Sig Dispense Refill  . ACCU-CHEK SOFTCLIX LANCETS lancets     . Alcohol Swabs (B-D SINGLE USE SWABS REGULAR) PADS     . amLODipine (NORVASC) 10 MG tablet Take 10 mg by mouth daily.  1  . aspirin 81 MG tablet Take 81 mg by mouth once.    Marland Kitchen atorvastatin (LIPITOR) 40 MG tablet Take 40 mg by mouth at bedtime.  1  . Blood Glucose Monitoring Suppl (ACCU-CHEK AVIVA PLUS) W/DEVICE KIT     . clonazePAM (KLONOPIN) 0.5 MG tablet Take 1 tablet (0.5 mg total) by mouth 2 (two) times daily. 30 tablet 0  . divalproex (DEPAKOTE) 500 MG DR tablet     . Dulaglutide (TRULICITY) 2.70 WC/3.7SE SOPN Inject into the skin once a week.    . fenofibrate 160 MG tablet     . flunisolide (NASALIDE) 25 MCG/ACT (0.025%) SOLN Place 2 sprays into the nose 2 (two) times daily. 1 Bottle 0  . furosemide (LASIX) 40 MG tablet     . insulin glargine (LANTUS) 100 UNIT/ML injection Inject 50 Units into the skin at bedtime.    Marland Kitchen LANTUS SOLOSTAR 100 UNIT/ML Solostar Pen     . Liraglutide (VICTOZA) 18 MG/3ML SOPN Inject 10 mg into the skin daily.    .  Multiple Vitamins-Minerals (MULTIVITAMIN ADULT PO) Use as directed 1 tablet in the mouth or throat daily.    Marland Kitchen NOVOLOG FLEXPEN 100 UNIT/ML FlexPen INJECT 30 UNITS SUBCUTANEOUSLY DAILY  0  . NOVOLOG PENFILL cartridge     . traZODone (DESYREL) 100 MG tablet     . triamcinolone lotion (KENALOG) 0.1 % APP EXT TO THE SCALP BID SPARINGLY FOR ITCHING OR IRRITATION  2  . Vitamin D, Ergocalciferol, (DRISDOL) 50000 UNITS CAPS capsule      No current facility-administered medications for this visit.     Allergies as of 01/22/2017 - Review Complete 01/22/2017  Allergen Reaction Noted  . Chlorpromazine Rash 05/08/2015    Vitals: BP 132/76   Pulse 94   Ht _0  (1.6 m)   Wt 273 lb (123.8 kg)   BMI 48.36 kg/m  Last Weight:  Wt Readings from Last 1 Encounters:  01/22/17 273 lb (123.8 kg)   GBT:DVVO mass index is 48.36 kg/m.     Last Height:   Ht Readings from Last 1 Encounters:  01/22/17 _1  (1.6 m)    Physical exam:  General: The patient is awake, alert and appears not in acute distress. The patient is well groomed. Head: Normocephalic, atraumatic. Neck is supple. Mallampati 5 ,  neck circumference:19. Nasal airflow congested . Retrognathia is seen. Poor dental status. Rudimentary teeth.   Cardiovascular:  Regular rate and rhythm, without  murmurs or carotid bruit, and without distended neck veins. Respiratory: Lungs are clear to auscultation. Skin:  Without evidence of edema, or rash Trunk: BMI is super obese. The patient's posture is stooped   Neurologic exam : The patient is awake and alert, oriented to place and time.  Attention span & concentration ability appears normal. Speech is fluent,  without dysarthria, dysphonia or aphasia.  Mood and affect are appropriate. Cranial nerves: Pupils are equal and briskly reactive to light.  Visual fields by finger perimetry are intact. Hearing to finger rub intact. Facial sensation intact to fine touch. Facial motor strength is symmetric and  tongue and uvula move midline. Shoulder shrug was symmetrical.  Motor exam:   Normal tone, muscle bulk and symmetric strength in all extremities within normal limits. Stance is stable and normal. Tandem gait is unfragmented. Turns with 4 Steps. Romberg testing is negative. Deep tendon reflexes: in the  upper and lower extremities are attenuated . Babinski maneuver response is downgoing.  The patient was advised of the nature of the diagnosed sleep disorder , the treatment options and risks for general a health and wellness arising from not treating the condition.  I spent more than 20 minutes of face to face time with the patient. Greater than 50% of time was spent in counseling and coordination of care. We have discussed the diagnosis and differential and I answered the patient's questions.     Dear Doreene Burke, Thank you for referring [name] my way !  It seems that we have hit a very sweet spot in terms of this patient's sleep apnea treatment. As I discussed above her initial response to CPAP was not positive and her apnea index actually exacerbated. But under BiPAP 17/13 cm water there has been a resolution of apnea, and improvement in her sleep quality and her daytime alertness and an overall sense that she has higher degree of well-being and quality of life. I will follow her from now on every 12 month for compliance reasons.  She still feels fatigued and wonders if she needs Vit B 12 or Vit D. I think it is reasonable to recommend an over-the-counter preparation of vitamin D 2000 units a day and to start a vitamin B complex once a day. She brought her vitamins today to the visit and she is actually taking vitamin D 1000 units per tablet already, she is taking 6 g of vitamin B12 and additional 1000 g vitamin B12 energy booster. I think she is well served to take her daily multivitamin twice a day and to take the vitamin B12 once a day and continue with the current preparations.    1) Rv in 12  month with biPAP download. 2) continue weight loss with a low carb diet. Fruits and vegetables.     Asencion Partridge Pheonix Wisby MD  01/22/2017   CC: Glendale Chard, [address], [place] [id number]

## 2017-01-29 ENCOUNTER — Ambulatory Visit: Payer: PPO | Admitting: *Deleted

## 2017-02-02 DIAGNOSIS — I129 Hypertensive chronic kidney disease with stage 1 through stage 4 chronic kidney disease, or unspecified chronic kidney disease: Secondary | ICD-10-CM | POA: Diagnosis not present

## 2017-02-02 DIAGNOSIS — E1122 Type 2 diabetes mellitus with diabetic chronic kidney disease: Secondary | ICD-10-CM | POA: Diagnosis not present

## 2017-02-02 DIAGNOSIS — N184 Chronic kidney disease, stage 4 (severe): Secondary | ICD-10-CM | POA: Diagnosis not present

## 2017-02-02 DIAGNOSIS — N08 Glomerular disorders in diseases classified elsewhere: Secondary | ICD-10-CM | POA: Diagnosis not present

## 2017-02-02 DIAGNOSIS — E782 Mixed hyperlipidemia: Secondary | ICD-10-CM | POA: Diagnosis not present

## 2017-02-06 ENCOUNTER — Ambulatory Visit: Payer: Medicare Other | Admitting: *Deleted

## 2017-02-06 ENCOUNTER — Encounter: Payer: Medicare Other | Attending: Internal Medicine | Admitting: *Deleted

## 2017-02-06 DIAGNOSIS — E119 Type 2 diabetes mellitus without complications: Secondary | ICD-10-CM | POA: Diagnosis not present

## 2017-02-06 DIAGNOSIS — Z713 Dietary counseling and surveillance: Secondary | ICD-10-CM | POA: Insufficient documentation

## 2017-02-06 NOTE — Patient Instructions (Signed)
Plan:  Aim for 2 Carb Choices per meal (30 grams) +/- 1 either way  Aim for 0-1 Carbs per snack if hungry  Include protein in moderation with your meals and snacks Continue reducing your sodium intake to help with water retention and blood pressure Continue with your activity level by doing Arm Chair exercises at 5 AM every day that are on Channel 4 Continue checking BG at alternate times per day such as before breakfast and supper or bedtime Continue taking medication Lantus at the same time each day, you have chosen 9 PM to help your sugars be more consistent. I am very proud of you that your Lantus dose has been reduced from 50 to 35 units per day! Continue taking Trulicity of once a week on Tuesdays

## 2017-02-06 NOTE — Progress Notes (Signed)
Diabetes Self-Management Education  Visit Type:     Appt. Start Time: 0800 Appt. End Time: 0830  10/23/2016  [name], identified by name and date of birth, is a 66 y.o. female with a diagnosis of Diabetes:  . She is here for follow up education for diabetes and weight loss.   Weight loss of 15 pounds noted. She states she is exercising every morning now with arm chair exercises on Channel 4 @ 5 AM almost every day. She is following her meal plan and making informed decisions about her food. She states she is checking her BG daily before breakfast with reported range of 100-160 mg/dl, averaging close to 130 mg/dl.   ASSESSMENT  Height 5\' 4"  (1.626 m), weight 272 lb 11.2 oz (123.7 kg). Body mass index is 46.81 kg/m.   Learning Objective:  Patient will have a greater understanding of diabetes self-management. Patient education plan is to attend individual and/or group sessions per assessed needs and concerns.  Complimented her on her many behavior changes resulting in much better BG control as well as significant weight loss.  I also showed her the Accu Chek Guide Meter that comes with Fast Clix lancing device and Fast Clix drums that have 6 lancets inside. They are simpler to use and covered by Medicare and Medicaid. She is interested in getting a Rx for this from MD if OK.  Plan:   Patient Instructions   Plan:  Aim for 2 Carb Choices per meal (30 grams) +/- 1 either way  Aim for 0-1 Carbs per snack if hungry  Include protein in moderation with your meals and snacks Continue reducing your sodium intake to help with water retention and blood pressure Continue with your activity level by doing Arm Chair exercises at 5 AM every day that are on Channel 4 Continue checking BG at alternate times per day such as before breakfast and supper or bedtime Continue taking medication Lantus at the same time each day, you have chosen 9 PM to help your sugars be more consistent. I am very  proud of you that your Lantus dose has been reduced from 50 to 35 units per day! Continue taking Trulicity of once a week on Tuesdays   Expected Outcomes:   Patient is still motivated, positive outcomes expected  Education material provided: A1c handout with [employer] Ranges for BG, BP and A1c on the back  If problems or questions, patient to contact team via:  Phone and Email  Future DSME appointment: -   3 months

## 2017-02-11 DIAGNOSIS — F250 Schizoaffective disorder, bipolar type: Secondary | ICD-10-CM | POA: Diagnosis not present

## 2017-02-17 DIAGNOSIS — I739 Peripheral vascular disease, unspecified: Secondary | ICD-10-CM | POA: Diagnosis not present

## 2017-02-17 DIAGNOSIS — E1051 Type 1 diabetes mellitus with diabetic peripheral angiopathy without gangrene: Secondary | ICD-10-CM | POA: Diagnosis not present

## 2017-02-17 DIAGNOSIS — L603 Nail dystrophy: Secondary | ICD-10-CM | POA: Diagnosis not present

## 2017-04-17 DIAGNOSIS — F250 Schizoaffective disorder, bipolar type: Secondary | ICD-10-CM | POA: Diagnosis not present

## 2017-04-21 DIAGNOSIS — F250 Schizoaffective disorder, bipolar type: Secondary | ICD-10-CM | POA: Diagnosis not present

## 2017-04-23 DIAGNOSIS — H1089 Other conjunctivitis: Secondary | ICD-10-CM | POA: Diagnosis not present

## 2017-04-30 DIAGNOSIS — H35033 Hypertensive retinopathy, bilateral: Secondary | ICD-10-CM | POA: Diagnosis not present

## 2017-04-30 DIAGNOSIS — H52223 Regular astigmatism, bilateral: Secondary | ICD-10-CM | POA: Diagnosis not present

## 2017-04-30 DIAGNOSIS — H5203 Hypermetropia, bilateral: Secondary | ICD-10-CM | POA: Diagnosis not present

## 2017-04-30 DIAGNOSIS — H5200 Hypermetropia, unspecified eye: Secondary | ICD-10-CM | POA: Diagnosis not present

## 2017-04-30 DIAGNOSIS — E119 Type 2 diabetes mellitus without complications: Secondary | ICD-10-CM | POA: Diagnosis not present

## 2017-04-30 DIAGNOSIS — I10 Essential (primary) hypertension: Secondary | ICD-10-CM | POA: Diagnosis not present

## 2017-05-04 DIAGNOSIS — E1122 Type 2 diabetes mellitus with diabetic chronic kidney disease: Secondary | ICD-10-CM | POA: Diagnosis not present

## 2017-05-04 DIAGNOSIS — N184 Chronic kidney disease, stage 4 (severe): Secondary | ICD-10-CM | POA: Diagnosis not present

## 2017-05-04 DIAGNOSIS — E782 Mixed hyperlipidemia: Secondary | ICD-10-CM | POA: Diagnosis not present

## 2017-05-04 DIAGNOSIS — Z23 Encounter for immunization: Secondary | ICD-10-CM | POA: Diagnosis not present

## 2017-05-04 DIAGNOSIS — I129 Hypertensive chronic kidney disease with stage 1 through stage 4 chronic kidney disease, or unspecified chronic kidney disease: Secondary | ICD-10-CM | POA: Diagnosis not present

## 2017-05-22 ENCOUNTER — Encounter: Payer: Medicare Other | Attending: Internal Medicine | Admitting: *Deleted

## 2017-05-22 DIAGNOSIS — E119 Type 2 diabetes mellitus without complications: Secondary | ICD-10-CM | POA: Diagnosis not present

## 2017-05-22 DIAGNOSIS — Z713 Dietary counseling and surveillance: Secondary | ICD-10-CM | POA: Insufficient documentation

## 2017-05-22 NOTE — Progress Notes (Signed)
Diabetes Self-Management Education  Visit Type: Follow-up  Appt. Start Time: 0830 Appt. End Time: 0900  05/22/2017  [name] Gross, identified by name and date of birth, is a 66 y.o. female with a diagnosis of Diabetes: Patient states she has increased her vegetable choices and frequency. She still enjoys any type of potato as well. She is not getting up to do Arm Chair exercises at 5 AM as often, sleeping in more. She continues to check fasting BG and reports range of 180-250 mg/dl. Weight is stable at this visit. She continues to be concerned over cost of Rx medications. She states the [place] CGM has been ordered but that she was told her insurance does not cover it either and would be $400/month to get it, which again she cannot afford.    ASSESSMENT  Height 5\' 4"  (1.626 m), weight 274 lb (124.3 kg). Body mass index is 47.03 kg/m.  Diabetes Self-Management Education - 05/22/17 1016      Visit Information   Visit Type  Follow-up      Psychosocial Assessment   Patient Belief/Attitude about Diabetes  Motivated to manage diabetes    Self-management support  CDE visits    Other persons present  Patient    Patient Concerns  Nutrition/Meal planning;Weight Control;Glycemic Control;Medication    Learning Readiness  Change in progress      Complications   Last HgB A1C per patient/outside source  -- I have requested from MD office   I have requested from MD office   How often do you check your blood sugar?  1-2 times/day    Fasting Blood glucose range (mg/dL)  180-200    Number of hypoglycemic episodes per month  0      Exercise   Exercise Type  ADL's some arm chair exercises at 5 AM    some arm chair exercises at 5 AM    How many days per week to you exercise?  2    How many minutes per day do you exercise?  20    Total minutes per week of exercise  40      Patient Education   Previous Diabetes Education  Yes (please comment)    Nutrition management   Carbohydrate counting    Physical activity and exercise   Helped patient identify appropriate exercises in relation to his/her diabetes, diabetes complications and other health issue.    Medications  Reviewed patients medication for diabetes, action, purpose, timing of dose and side effects.    Monitoring  Identified appropriate SMBG and/or A1C goals.      Individualized Goals (developed by patient)   Nutrition  Follow meal plan discussed    Physical Activity  Exercise 3-5 times per week    Medications  take my medication as prescribed    Monitoring   test my blood glucose as discussed      Patient Self-Evaluation of Goals - Patient rates self as meeting previously set goals (% of time)   Nutrition  50 - 75 %    Physical Activity  25 - 50%    Medications  50 - 75 %    Monitoring  >75%      Outcomes   Expected Outcomes  Demonstrated interest in learning. Expect positive outcomes    Future DMSE  3-4 months    Program Status  Completed      Subsequent Visit   Since your last visit have you continued or begun to take your medications as prescribed?  Yes if she can afford them   if she can afford them   Since your last visit have you experienced any weight changes?  No change    Since your last visit, are you checking your blood glucose at least once a day?  Yes       Individualized Plan for Diabetes Self-Management Training:   Learning Objective:  Patient will have a greater understanding of diabetes self-management. Patient education plan is to attend individual and/or group sessions per assessed needs and concerns.   Plan:   Patient Instructions  Plan:  Aim for 2 Carb Choices per meal (30 grams) +/- 1 either way  Aim for 0-1 Carbs per snack if hungry  Enjoy your vegetables! Include protein in moderation with your meals and snacks Continue reducing your sodium intake to help with water retention and blood pressure Consider doing Arm Chair exercises at different times of day (when you don;t get up at 5  AM) like during commercials of tv shows during the day Continue checking BG once a day Continue taking medication as prescribed  I plan to contact the [place] to ask her to let you know what options you have to get the [facility] covered, if any. We also reviewed the different types of insulin including pre-,mixed and which types would be less expensive for you.   Expected Outcomes:  Demonstrated interest in learning. Expect positive outcomes  Education material provided: A1C conversion sheet  If problems or questions, patient to contact team via:  Phone  Future DSME appointment: 3-4 months

## 2017-05-22 NOTE — Patient Instructions (Signed)
Plan:  Aim for 2 Carb Choices per meal (30 grams) +/- 1 either way  Aim for 0-1 Carbs per snack if hungry  Enjoy your vegetables! Include protein in moderation with your meals and snacks Continue reducing your sodium intake to help with water retention and blood pressure Consider doing Arm Chair exercises at different times of day (when you don;t get up at 5 AM) like during commercials of tv shows during the day Continue checking BG once a day Continue taking medication as prescribed

## 2017-05-26 DIAGNOSIS — E1042 Type 1 diabetes mellitus with diabetic polyneuropathy: Secondary | ICD-10-CM | POA: Diagnosis not present

## 2017-07-01 DIAGNOSIS — E782 Mixed hyperlipidemia: Secondary | ICD-10-CM | POA: Diagnosis not present

## 2017-07-01 DIAGNOSIS — E1122 Type 2 diabetes mellitus with diabetic chronic kidney disease: Secondary | ICD-10-CM | POA: Diagnosis not present

## 2017-07-01 DIAGNOSIS — I129 Hypertensive chronic kidney disease with stage 1 through stage 4 chronic kidney disease, or unspecified chronic kidney disease: Secondary | ICD-10-CM | POA: Diagnosis not present

## 2017-07-01 DIAGNOSIS — N184 Chronic kidney disease, stage 4 (severe): Secondary | ICD-10-CM | POA: Diagnosis not present

## 2017-07-01 DIAGNOSIS — Z9181 History of falling: Secondary | ICD-10-CM | POA: Diagnosis not present

## 2017-07-01 DIAGNOSIS — Z794 Long term (current) use of insulin: Secondary | ICD-10-CM | POA: Diagnosis not present

## 2017-07-01 DIAGNOSIS — M25562 Pain in left knee: Secondary | ICD-10-CM | POA: Diagnosis not present

## 2017-07-01 DIAGNOSIS — F419 Anxiety disorder, unspecified: Secondary | ICD-10-CM | POA: Diagnosis not present

## 2017-07-01 DIAGNOSIS — E669 Obesity, unspecified: Secondary | ICD-10-CM | POA: Diagnosis not present

## 2017-07-01 DIAGNOSIS — D509 Iron deficiency anemia, unspecified: Secondary | ICD-10-CM | POA: Diagnosis not present

## 2017-07-01 DIAGNOSIS — F319 Bipolar disorder, unspecified: Secondary | ICD-10-CM | POA: Diagnosis not present

## 2017-07-18 DIAGNOSIS — I129 Hypertensive chronic kidney disease with stage 1 through stage 4 chronic kidney disease, or unspecified chronic kidney disease: Secondary | ICD-10-CM | POA: Diagnosis not present

## 2017-07-18 DIAGNOSIS — D509 Iron deficiency anemia, unspecified: Secondary | ICD-10-CM | POA: Diagnosis not present

## 2017-07-18 DIAGNOSIS — N184 Chronic kidney disease, stage 4 (severe): Secondary | ICD-10-CM | POA: Diagnosis not present

## 2017-07-18 DIAGNOSIS — G4733 Obstructive sleep apnea (adult) (pediatric): Secondary | ICD-10-CM | POA: Diagnosis not present

## 2017-07-18 DIAGNOSIS — F319 Bipolar disorder, unspecified: Secondary | ICD-10-CM | POA: Diagnosis not present

## 2017-07-18 DIAGNOSIS — E782 Mixed hyperlipidemia: Secondary | ICD-10-CM | POA: Diagnosis not present

## 2017-07-18 DIAGNOSIS — F419 Anxiety disorder, unspecified: Secondary | ICD-10-CM | POA: Diagnosis not present

## 2017-07-18 DIAGNOSIS — E1122 Type 2 diabetes mellitus with diabetic chronic kidney disease: Secondary | ICD-10-CM | POA: Diagnosis not present

## 2017-07-18 DIAGNOSIS — E669 Obesity, unspecified: Secondary | ICD-10-CM | POA: Diagnosis not present

## 2017-07-21 DIAGNOSIS — F250 Schizoaffective disorder, bipolar type: Secondary | ICD-10-CM | POA: Diagnosis not present

## 2017-07-22 DIAGNOSIS — D509 Iron deficiency anemia, unspecified: Secondary | ICD-10-CM | POA: Diagnosis not present

## 2017-07-22 DIAGNOSIS — N184 Chronic kidney disease, stage 4 (severe): Secondary | ICD-10-CM | POA: Diagnosis not present

## 2017-07-22 DIAGNOSIS — G4733 Obstructive sleep apnea (adult) (pediatric): Secondary | ICD-10-CM | POA: Diagnosis not present

## 2017-07-22 DIAGNOSIS — E782 Mixed hyperlipidemia: Secondary | ICD-10-CM | POA: Diagnosis not present

## 2017-07-22 DIAGNOSIS — E1122 Type 2 diabetes mellitus with diabetic chronic kidney disease: Secondary | ICD-10-CM | POA: Diagnosis not present

## 2017-07-22 DIAGNOSIS — E669 Obesity, unspecified: Secondary | ICD-10-CM | POA: Diagnosis not present

## 2017-07-22 DIAGNOSIS — F419 Anxiety disorder, unspecified: Secondary | ICD-10-CM | POA: Diagnosis not present

## 2017-07-22 DIAGNOSIS — F319 Bipolar disorder, unspecified: Secondary | ICD-10-CM | POA: Diagnosis not present

## 2017-07-22 DIAGNOSIS — I129 Hypertensive chronic kidney disease with stage 1 through stage 4 chronic kidney disease, or unspecified chronic kidney disease: Secondary | ICD-10-CM | POA: Diagnosis not present

## 2017-07-23 DIAGNOSIS — N184 Chronic kidney disease, stage 4 (severe): Secondary | ICD-10-CM | POA: Diagnosis not present

## 2017-07-23 DIAGNOSIS — F419 Anxiety disorder, unspecified: Secondary | ICD-10-CM | POA: Diagnosis not present

## 2017-07-23 DIAGNOSIS — E782 Mixed hyperlipidemia: Secondary | ICD-10-CM | POA: Diagnosis not present

## 2017-07-23 DIAGNOSIS — E1122 Type 2 diabetes mellitus with diabetic chronic kidney disease: Secondary | ICD-10-CM | POA: Diagnosis not present

## 2017-07-23 DIAGNOSIS — E669 Obesity, unspecified: Secondary | ICD-10-CM | POA: Diagnosis not present

## 2017-07-23 DIAGNOSIS — G4733 Obstructive sleep apnea (adult) (pediatric): Secondary | ICD-10-CM | POA: Diagnosis not present

## 2017-07-23 DIAGNOSIS — F319 Bipolar disorder, unspecified: Secondary | ICD-10-CM | POA: Diagnosis not present

## 2017-07-23 DIAGNOSIS — I129 Hypertensive chronic kidney disease with stage 1 through stage 4 chronic kidney disease, or unspecified chronic kidney disease: Secondary | ICD-10-CM | POA: Diagnosis not present

## 2017-07-23 DIAGNOSIS — D509 Iron deficiency anemia, unspecified: Secondary | ICD-10-CM | POA: Diagnosis not present

## 2017-07-27 ENCOUNTER — Ambulatory Visit: Payer: Medicare Other | Admitting: Adult Health

## 2017-07-27 ENCOUNTER — Telehealth: Payer: Self-pay | Admitting: *Deleted

## 2017-07-27 DIAGNOSIS — F319 Bipolar disorder, unspecified: Secondary | ICD-10-CM | POA: Diagnosis not present

## 2017-07-27 DIAGNOSIS — E1122 Type 2 diabetes mellitus with diabetic chronic kidney disease: Secondary | ICD-10-CM | POA: Diagnosis not present

## 2017-07-27 DIAGNOSIS — N184 Chronic kidney disease, stage 4 (severe): Secondary | ICD-10-CM | POA: Diagnosis not present

## 2017-07-27 DIAGNOSIS — G4733 Obstructive sleep apnea (adult) (pediatric): Secondary | ICD-10-CM | POA: Diagnosis not present

## 2017-07-27 DIAGNOSIS — D509 Iron deficiency anemia, unspecified: Secondary | ICD-10-CM | POA: Diagnosis not present

## 2017-07-27 DIAGNOSIS — F419 Anxiety disorder, unspecified: Secondary | ICD-10-CM | POA: Diagnosis not present

## 2017-07-27 DIAGNOSIS — I129 Hypertensive chronic kidney disease with stage 1 through stage 4 chronic kidney disease, or unspecified chronic kidney disease: Secondary | ICD-10-CM | POA: Diagnosis not present

## 2017-07-27 DIAGNOSIS — E782 Mixed hyperlipidemia: Secondary | ICD-10-CM | POA: Diagnosis not present

## 2017-07-27 DIAGNOSIS — E669 Obesity, unspecified: Secondary | ICD-10-CM | POA: Diagnosis not present

## 2017-07-27 NOTE — Telephone Encounter (Signed)
Called patient last evening to cancel her follow up today. She verbalized understanding. This was due to the office opening late today due to inclement weather.  9:05 am  Attempted to reach her this morning; phone continuously rang. Patient's FU today was cancelled. She will need to be called to reschedule. No show fee will not apply.

## 2017-07-28 DIAGNOSIS — G4733 Obstructive sleep apnea (adult) (pediatric): Secondary | ICD-10-CM | POA: Diagnosis not present

## 2017-07-28 DIAGNOSIS — E669 Obesity, unspecified: Secondary | ICD-10-CM | POA: Diagnosis not present

## 2017-07-28 DIAGNOSIS — F419 Anxiety disorder, unspecified: Secondary | ICD-10-CM | POA: Diagnosis not present

## 2017-07-28 DIAGNOSIS — N184 Chronic kidney disease, stage 4 (severe): Secondary | ICD-10-CM | POA: Diagnosis not present

## 2017-07-28 DIAGNOSIS — E1122 Type 2 diabetes mellitus with diabetic chronic kidney disease: Secondary | ICD-10-CM | POA: Diagnosis not present

## 2017-07-28 DIAGNOSIS — D509 Iron deficiency anemia, unspecified: Secondary | ICD-10-CM | POA: Diagnosis not present

## 2017-07-28 DIAGNOSIS — E782 Mixed hyperlipidemia: Secondary | ICD-10-CM | POA: Diagnosis not present

## 2017-07-28 DIAGNOSIS — I129 Hypertensive chronic kidney disease with stage 1 through stage 4 chronic kidney disease, or unspecified chronic kidney disease: Secondary | ICD-10-CM | POA: Diagnosis not present

## 2017-07-28 DIAGNOSIS — F319 Bipolar disorder, unspecified: Secondary | ICD-10-CM | POA: Diagnosis not present

## 2017-07-29 DIAGNOSIS — F319 Bipolar disorder, unspecified: Secondary | ICD-10-CM | POA: Diagnosis not present

## 2017-07-29 DIAGNOSIS — I129 Hypertensive chronic kidney disease with stage 1 through stage 4 chronic kidney disease, or unspecified chronic kidney disease: Secondary | ICD-10-CM | POA: Diagnosis not present

## 2017-07-29 DIAGNOSIS — D509 Iron deficiency anemia, unspecified: Secondary | ICD-10-CM | POA: Diagnosis not present

## 2017-07-29 DIAGNOSIS — E782 Mixed hyperlipidemia: Secondary | ICD-10-CM | POA: Diagnosis not present

## 2017-07-29 DIAGNOSIS — E669 Obesity, unspecified: Secondary | ICD-10-CM | POA: Diagnosis not present

## 2017-07-29 DIAGNOSIS — E1122 Type 2 diabetes mellitus with diabetic chronic kidney disease: Secondary | ICD-10-CM | POA: Diagnosis not present

## 2017-07-29 DIAGNOSIS — G4733 Obstructive sleep apnea (adult) (pediatric): Secondary | ICD-10-CM | POA: Diagnosis not present

## 2017-07-29 DIAGNOSIS — F419 Anxiety disorder, unspecified: Secondary | ICD-10-CM | POA: Diagnosis not present

## 2017-07-29 DIAGNOSIS — N184 Chronic kidney disease, stage 4 (severe): Secondary | ICD-10-CM | POA: Diagnosis not present

## 2017-07-31 DIAGNOSIS — E1165 Type 2 diabetes mellitus with hyperglycemia: Secondary | ICD-10-CM | POA: Diagnosis not present

## 2017-07-31 DIAGNOSIS — N08 Glomerular disorders in diseases classified elsewhere: Secondary | ICD-10-CM | POA: Diagnosis not present

## 2017-07-31 DIAGNOSIS — G4733 Obstructive sleep apnea (adult) (pediatric): Secondary | ICD-10-CM | POA: Diagnosis not present

## 2017-07-31 DIAGNOSIS — Z0000 Encounter for general adult medical examination without abnormal findings: Secondary | ICD-10-CM | POA: Diagnosis not present

## 2017-07-31 DIAGNOSIS — E782 Mixed hyperlipidemia: Secondary | ICD-10-CM | POA: Diagnosis not present

## 2017-07-31 DIAGNOSIS — E1122 Type 2 diabetes mellitus with diabetic chronic kidney disease: Secondary | ICD-10-CM | POA: Diagnosis not present

## 2017-07-31 DIAGNOSIS — E669 Obesity, unspecified: Secondary | ICD-10-CM | POA: Diagnosis not present

## 2017-07-31 DIAGNOSIS — D509 Iron deficiency anemia, unspecified: Secondary | ICD-10-CM | POA: Diagnosis not present

## 2017-07-31 DIAGNOSIS — F419 Anxiety disorder, unspecified: Secondary | ICD-10-CM | POA: Diagnosis not present

## 2017-07-31 DIAGNOSIS — I129 Hypertensive chronic kidney disease with stage 1 through stage 4 chronic kidney disease, or unspecified chronic kidney disease: Secondary | ICD-10-CM | POA: Diagnosis not present

## 2017-07-31 DIAGNOSIS — N184 Chronic kidney disease, stage 4 (severe): Secondary | ICD-10-CM | POA: Diagnosis not present

## 2017-07-31 DIAGNOSIS — F319 Bipolar disorder, unspecified: Secondary | ICD-10-CM | POA: Diagnosis not present

## 2017-07-31 DIAGNOSIS — F3181 Bipolar II disorder: Secondary | ICD-10-CM | POA: Diagnosis not present

## 2017-08-04 DIAGNOSIS — N184 Chronic kidney disease, stage 4 (severe): Secondary | ICD-10-CM | POA: Diagnosis not present

## 2017-08-04 DIAGNOSIS — Z9181 History of falling: Secondary | ICD-10-CM | POA: Diagnosis not present

## 2017-08-04 DIAGNOSIS — I129 Hypertensive chronic kidney disease with stage 1 through stage 4 chronic kidney disease, or unspecified chronic kidney disease: Secondary | ICD-10-CM | POA: Diagnosis not present

## 2017-08-04 DIAGNOSIS — Z6841 Body Mass Index (BMI) 40.0 and over, adult: Secondary | ICD-10-CM | POA: Diagnosis not present

## 2017-08-04 DIAGNOSIS — G4733 Obstructive sleep apnea (adult) (pediatric): Secondary | ICD-10-CM | POA: Diagnosis not present

## 2017-08-04 DIAGNOSIS — G4730 Sleep apnea, unspecified: Secondary | ICD-10-CM | POA: Diagnosis not present

## 2017-08-04 DIAGNOSIS — E1122 Type 2 diabetes mellitus with diabetic chronic kidney disease: Secondary | ICD-10-CM | POA: Diagnosis not present

## 2017-08-04 DIAGNOSIS — N261 Atrophy of kidney (terminal): Secondary | ICD-10-CM | POA: Diagnosis not present

## 2017-08-05 DIAGNOSIS — N184 Chronic kidney disease, stage 4 (severe): Secondary | ICD-10-CM | POA: Diagnosis not present

## 2017-08-05 DIAGNOSIS — E669 Obesity, unspecified: Secondary | ICD-10-CM | POA: Diagnosis not present

## 2017-08-05 DIAGNOSIS — E1122 Type 2 diabetes mellitus with diabetic chronic kidney disease: Secondary | ICD-10-CM | POA: Diagnosis not present

## 2017-08-05 DIAGNOSIS — F319 Bipolar disorder, unspecified: Secondary | ICD-10-CM | POA: Diagnosis not present

## 2017-08-05 DIAGNOSIS — E782 Mixed hyperlipidemia: Secondary | ICD-10-CM | POA: Diagnosis not present

## 2017-08-05 DIAGNOSIS — G4733 Obstructive sleep apnea (adult) (pediatric): Secondary | ICD-10-CM | POA: Diagnosis not present

## 2017-08-05 DIAGNOSIS — D509 Iron deficiency anemia, unspecified: Secondary | ICD-10-CM | POA: Diagnosis not present

## 2017-08-05 DIAGNOSIS — I129 Hypertensive chronic kidney disease with stage 1 through stage 4 chronic kidney disease, or unspecified chronic kidney disease: Secondary | ICD-10-CM | POA: Diagnosis not present

## 2017-08-05 DIAGNOSIS — F419 Anxiety disorder, unspecified: Secondary | ICD-10-CM | POA: Diagnosis not present

## 2017-08-06 DIAGNOSIS — I129 Hypertensive chronic kidney disease with stage 1 through stage 4 chronic kidney disease, or unspecified chronic kidney disease: Secondary | ICD-10-CM | POA: Diagnosis not present

## 2017-08-06 DIAGNOSIS — D509 Iron deficiency anemia, unspecified: Secondary | ICD-10-CM | POA: Diagnosis not present

## 2017-08-06 DIAGNOSIS — E1122 Type 2 diabetes mellitus with diabetic chronic kidney disease: Secondary | ICD-10-CM | POA: Diagnosis not present

## 2017-08-06 DIAGNOSIS — E782 Mixed hyperlipidemia: Secondary | ICD-10-CM | POA: Diagnosis not present

## 2017-08-06 DIAGNOSIS — F319 Bipolar disorder, unspecified: Secondary | ICD-10-CM | POA: Diagnosis not present

## 2017-08-06 DIAGNOSIS — F419 Anxiety disorder, unspecified: Secondary | ICD-10-CM | POA: Diagnosis not present

## 2017-08-06 DIAGNOSIS — E669 Obesity, unspecified: Secondary | ICD-10-CM | POA: Diagnosis not present

## 2017-08-06 DIAGNOSIS — G4733 Obstructive sleep apnea (adult) (pediatric): Secondary | ICD-10-CM | POA: Diagnosis not present

## 2017-08-06 DIAGNOSIS — N184 Chronic kidney disease, stage 4 (severe): Secondary | ICD-10-CM | POA: Diagnosis not present

## 2017-08-07 DIAGNOSIS — E782 Mixed hyperlipidemia: Secondary | ICD-10-CM | POA: Diagnosis not present

## 2017-08-07 DIAGNOSIS — I129 Hypertensive chronic kidney disease with stage 1 through stage 4 chronic kidney disease, or unspecified chronic kidney disease: Secondary | ICD-10-CM | POA: Diagnosis not present

## 2017-08-07 DIAGNOSIS — N184 Chronic kidney disease, stage 4 (severe): Secondary | ICD-10-CM | POA: Diagnosis not present

## 2017-08-07 DIAGNOSIS — F419 Anxiety disorder, unspecified: Secondary | ICD-10-CM | POA: Diagnosis not present

## 2017-08-07 DIAGNOSIS — D509 Iron deficiency anemia, unspecified: Secondary | ICD-10-CM | POA: Diagnosis not present

## 2017-08-07 DIAGNOSIS — E1122 Type 2 diabetes mellitus with diabetic chronic kidney disease: Secondary | ICD-10-CM | POA: Diagnosis not present

## 2017-08-07 DIAGNOSIS — G4733 Obstructive sleep apnea (adult) (pediatric): Secondary | ICD-10-CM | POA: Diagnosis not present

## 2017-08-07 DIAGNOSIS — E669 Obesity, unspecified: Secondary | ICD-10-CM | POA: Diagnosis not present

## 2017-08-07 DIAGNOSIS — F319 Bipolar disorder, unspecified: Secondary | ICD-10-CM | POA: Diagnosis not present

## 2017-08-10 DIAGNOSIS — N184 Chronic kidney disease, stage 4 (severe): Secondary | ICD-10-CM | POA: Diagnosis not present

## 2017-08-10 DIAGNOSIS — D509 Iron deficiency anemia, unspecified: Secondary | ICD-10-CM | POA: Diagnosis not present

## 2017-08-10 DIAGNOSIS — I129 Hypertensive chronic kidney disease with stage 1 through stage 4 chronic kidney disease, or unspecified chronic kidney disease: Secondary | ICD-10-CM | POA: Diagnosis not present

## 2017-08-10 DIAGNOSIS — G4733 Obstructive sleep apnea (adult) (pediatric): Secondary | ICD-10-CM | POA: Diagnosis not present

## 2017-08-10 DIAGNOSIS — E1122 Type 2 diabetes mellitus with diabetic chronic kidney disease: Secondary | ICD-10-CM | POA: Diagnosis not present

## 2017-08-10 DIAGNOSIS — E782 Mixed hyperlipidemia: Secondary | ICD-10-CM | POA: Diagnosis not present

## 2017-08-10 DIAGNOSIS — F319 Bipolar disorder, unspecified: Secondary | ICD-10-CM | POA: Diagnosis not present

## 2017-08-10 DIAGNOSIS — E669 Obesity, unspecified: Secondary | ICD-10-CM | POA: Diagnosis not present

## 2017-08-10 DIAGNOSIS — F419 Anxiety disorder, unspecified: Secondary | ICD-10-CM | POA: Diagnosis not present

## 2017-08-12 ENCOUNTER — Other Ambulatory Visit: Payer: Self-pay | Admitting: Internal Medicine

## 2017-08-12 DIAGNOSIS — G4733 Obstructive sleep apnea (adult) (pediatric): Secondary | ICD-10-CM | POA: Diagnosis not present

## 2017-08-12 DIAGNOSIS — F419 Anxiety disorder, unspecified: Secondary | ICD-10-CM | POA: Diagnosis not present

## 2017-08-12 DIAGNOSIS — F319 Bipolar disorder, unspecified: Secondary | ICD-10-CM | POA: Diagnosis not present

## 2017-08-12 DIAGNOSIS — N184 Chronic kidney disease, stage 4 (severe): Secondary | ICD-10-CM | POA: Diagnosis not present

## 2017-08-12 DIAGNOSIS — E2839 Other primary ovarian failure: Secondary | ICD-10-CM

## 2017-08-12 DIAGNOSIS — E1122 Type 2 diabetes mellitus with diabetic chronic kidney disease: Secondary | ICD-10-CM | POA: Diagnosis not present

## 2017-08-12 DIAGNOSIS — I129 Hypertensive chronic kidney disease with stage 1 through stage 4 chronic kidney disease, or unspecified chronic kidney disease: Secondary | ICD-10-CM | POA: Diagnosis not present

## 2017-08-12 DIAGNOSIS — D509 Iron deficiency anemia, unspecified: Secondary | ICD-10-CM | POA: Diagnosis not present

## 2017-08-12 DIAGNOSIS — E669 Obesity, unspecified: Secondary | ICD-10-CM | POA: Diagnosis not present

## 2017-08-12 DIAGNOSIS — E782 Mixed hyperlipidemia: Secondary | ICD-10-CM | POA: Diagnosis not present

## 2017-08-13 DIAGNOSIS — E1122 Type 2 diabetes mellitus with diabetic chronic kidney disease: Secondary | ICD-10-CM | POA: Diagnosis not present

## 2017-08-13 DIAGNOSIS — I129 Hypertensive chronic kidney disease with stage 1 through stage 4 chronic kidney disease, or unspecified chronic kidney disease: Secondary | ICD-10-CM | POA: Diagnosis not present

## 2017-08-13 DIAGNOSIS — G4733 Obstructive sleep apnea (adult) (pediatric): Secondary | ICD-10-CM | POA: Diagnosis not present

## 2017-08-13 DIAGNOSIS — E782 Mixed hyperlipidemia: Secondary | ICD-10-CM | POA: Diagnosis not present

## 2017-08-13 DIAGNOSIS — N184 Chronic kidney disease, stage 4 (severe): Secondary | ICD-10-CM | POA: Diagnosis not present

## 2017-08-13 DIAGNOSIS — D509 Iron deficiency anemia, unspecified: Secondary | ICD-10-CM | POA: Diagnosis not present

## 2017-08-13 DIAGNOSIS — F319 Bipolar disorder, unspecified: Secondary | ICD-10-CM | POA: Diagnosis not present

## 2017-08-13 DIAGNOSIS — F419 Anxiety disorder, unspecified: Secondary | ICD-10-CM | POA: Diagnosis not present

## 2017-08-13 DIAGNOSIS — E669 Obesity, unspecified: Secondary | ICD-10-CM | POA: Diagnosis not present

## 2017-08-14 DIAGNOSIS — E1122 Type 2 diabetes mellitus with diabetic chronic kidney disease: Secondary | ICD-10-CM | POA: Diagnosis not present

## 2017-08-14 DIAGNOSIS — D509 Iron deficiency anemia, unspecified: Secondary | ICD-10-CM | POA: Diagnosis not present

## 2017-08-14 DIAGNOSIS — E669 Obesity, unspecified: Secondary | ICD-10-CM | POA: Diagnosis not present

## 2017-08-14 DIAGNOSIS — F419 Anxiety disorder, unspecified: Secondary | ICD-10-CM | POA: Diagnosis not present

## 2017-08-14 DIAGNOSIS — E782 Mixed hyperlipidemia: Secondary | ICD-10-CM | POA: Diagnosis not present

## 2017-08-14 DIAGNOSIS — I129 Hypertensive chronic kidney disease with stage 1 through stage 4 chronic kidney disease, or unspecified chronic kidney disease: Secondary | ICD-10-CM | POA: Diagnosis not present

## 2017-08-14 DIAGNOSIS — G4733 Obstructive sleep apnea (adult) (pediatric): Secondary | ICD-10-CM | POA: Diagnosis not present

## 2017-08-14 DIAGNOSIS — N184 Chronic kidney disease, stage 4 (severe): Secondary | ICD-10-CM | POA: Diagnosis not present

## 2017-08-14 DIAGNOSIS — F319 Bipolar disorder, unspecified: Secondary | ICD-10-CM | POA: Diagnosis not present

## 2017-08-17 DIAGNOSIS — D509 Iron deficiency anemia, unspecified: Secondary | ICD-10-CM | POA: Diagnosis not present

## 2017-08-17 DIAGNOSIS — F319 Bipolar disorder, unspecified: Secondary | ICD-10-CM | POA: Diagnosis not present

## 2017-08-17 DIAGNOSIS — N184 Chronic kidney disease, stage 4 (severe): Secondary | ICD-10-CM | POA: Diagnosis not present

## 2017-08-17 DIAGNOSIS — I129 Hypertensive chronic kidney disease with stage 1 through stage 4 chronic kidney disease, or unspecified chronic kidney disease: Secondary | ICD-10-CM | POA: Diagnosis not present

## 2017-08-17 DIAGNOSIS — E669 Obesity, unspecified: Secondary | ICD-10-CM | POA: Diagnosis not present

## 2017-08-17 DIAGNOSIS — F419 Anxiety disorder, unspecified: Secondary | ICD-10-CM | POA: Diagnosis not present

## 2017-08-17 DIAGNOSIS — G4733 Obstructive sleep apnea (adult) (pediatric): Secondary | ICD-10-CM | POA: Diagnosis not present

## 2017-08-17 DIAGNOSIS — E782 Mixed hyperlipidemia: Secondary | ICD-10-CM | POA: Diagnosis not present

## 2017-08-17 DIAGNOSIS — E1122 Type 2 diabetes mellitus with diabetic chronic kidney disease: Secondary | ICD-10-CM | POA: Diagnosis not present

## 2017-08-19 DIAGNOSIS — E669 Obesity, unspecified: Secondary | ICD-10-CM | POA: Diagnosis not present

## 2017-08-19 DIAGNOSIS — I129 Hypertensive chronic kidney disease with stage 1 through stage 4 chronic kidney disease, or unspecified chronic kidney disease: Secondary | ICD-10-CM | POA: Diagnosis not present

## 2017-08-19 DIAGNOSIS — E782 Mixed hyperlipidemia: Secondary | ICD-10-CM | POA: Diagnosis not present

## 2017-08-19 DIAGNOSIS — E1122 Type 2 diabetes mellitus with diabetic chronic kidney disease: Secondary | ICD-10-CM | POA: Diagnosis not present

## 2017-08-19 DIAGNOSIS — N184 Chronic kidney disease, stage 4 (severe): Secondary | ICD-10-CM | POA: Diagnosis not present

## 2017-08-19 DIAGNOSIS — G4733 Obstructive sleep apnea (adult) (pediatric): Secondary | ICD-10-CM | POA: Diagnosis not present

## 2017-08-19 DIAGNOSIS — F419 Anxiety disorder, unspecified: Secondary | ICD-10-CM | POA: Diagnosis not present

## 2017-08-19 DIAGNOSIS — F319 Bipolar disorder, unspecified: Secondary | ICD-10-CM | POA: Diagnosis not present

## 2017-08-19 DIAGNOSIS — D509 Iron deficiency anemia, unspecified: Secondary | ICD-10-CM | POA: Diagnosis not present

## 2017-08-20 DIAGNOSIS — D509 Iron deficiency anemia, unspecified: Secondary | ICD-10-CM | POA: Diagnosis not present

## 2017-08-20 DIAGNOSIS — E1122 Type 2 diabetes mellitus with diabetic chronic kidney disease: Secondary | ICD-10-CM | POA: Diagnosis not present

## 2017-08-20 DIAGNOSIS — F319 Bipolar disorder, unspecified: Secondary | ICD-10-CM | POA: Diagnosis not present

## 2017-08-20 DIAGNOSIS — E782 Mixed hyperlipidemia: Secondary | ICD-10-CM | POA: Diagnosis not present

## 2017-08-20 DIAGNOSIS — E669 Obesity, unspecified: Secondary | ICD-10-CM | POA: Diagnosis not present

## 2017-08-20 DIAGNOSIS — G4733 Obstructive sleep apnea (adult) (pediatric): Secondary | ICD-10-CM | POA: Diagnosis not present

## 2017-08-20 DIAGNOSIS — I129 Hypertensive chronic kidney disease with stage 1 through stage 4 chronic kidney disease, or unspecified chronic kidney disease: Secondary | ICD-10-CM | POA: Diagnosis not present

## 2017-08-20 DIAGNOSIS — F419 Anxiety disorder, unspecified: Secondary | ICD-10-CM | POA: Diagnosis not present

## 2017-08-20 DIAGNOSIS — N184 Chronic kidney disease, stage 4 (severe): Secondary | ICD-10-CM | POA: Diagnosis not present

## 2017-08-21 ENCOUNTER — Encounter: Payer: Medicare HMO | Attending: Internal Medicine | Admitting: *Deleted

## 2017-08-21 DIAGNOSIS — E119 Type 2 diabetes mellitus without complications: Secondary | ICD-10-CM | POA: Diagnosis not present

## 2017-08-21 DIAGNOSIS — Z713 Dietary counseling and surveillance: Secondary | ICD-10-CM | POA: Diagnosis not present

## 2017-08-21 DIAGNOSIS — L84 Corns and callosities: Secondary | ICD-10-CM | POA: Diagnosis not present

## 2017-08-21 DIAGNOSIS — L603 Nail dystrophy: Secondary | ICD-10-CM | POA: Diagnosis not present

## 2017-08-21 DIAGNOSIS — I739 Peripheral vascular disease, unspecified: Secondary | ICD-10-CM | POA: Diagnosis not present

## 2017-08-21 NOTE — Progress Notes (Signed)
Diabetes Self-Management Education  Visit Type:    Appt. Start Time: 0800 Appt. End Time: 0830  08/21/2017  [name], identified by name and date of birth, is a 67 y.o. female with a diagnosis of Diabetes: Patient is here for 3 month follow up visit. Weight loss of 7 pounds noted! She states she has continued to increase her vegetable choices and frequency.  She is now getting up to do Arm Chair exercises at 5 AM and has added band exercises and is walking down he hallway and around her living room for about 10 minutes every day. She also states she is not sleeping as much as she used to and she is doing more housework now. She continues to check fasting BG and reports an improved range down in the of 130-200 mg/dl. She reports that her MD has [place] all diabetes medications she had been on and has started her on Xultophy, which she is getting at a much reduced cost! Her insurance has changed to [facility] so she is getting a 3 month supply delivered to her home which is a fantastic improvement! She states the [place] CGM will probably be ordered at her next MD visit.   ASSESSMENT  Height 5\' 4"  (1.626 m), weight 268 lb 12.8 oz (121.9 kg). Body mass index is 46.14 kg/m.   Individualized Plan for Diabetes Self-Management Training:   Learning Objective:  Patient will have a greater understanding of diabetes self-management. Patient education plan is to attend individual and/or group sessions per assessed needs and concerns.   Plan:   Patient Instructions  Plan:  Aim for 2 Carb Choices per meal (30 grams) +/- 1 either way  Aim for 0-1 Carbs per snack if hungry  Enjoy your vegetables! Include protein in moderation with your meals and snacks Continue reducing your sodium intake to help with water retention and blood pressure Continue doing Arm Chair exercises at 5 AM as tolerated and additional band stretching exercises and walking daily. Good job! Continue checking BG once a day in  AM Continue taking medication as prescribed   Expected Outcomes:     Education material provided: Carb Counting handout, Yellow Meal Card, review of [employer] BG Targets  If problems or questions, patient to contact team via:  Phone  Future DSME appointment:  3 months

## 2017-08-21 NOTE — Patient Instructions (Signed)
Plan:  Aim for 2 Carb Choices per meal (30 grams) +/- 1 either way  Aim for 0-1 Carbs per snack if hungry  Enjoy your vegetables! Include protein in moderation with your meals and snacks Continue reducing your sodium intake to help with water retention and blood pressure Continue doing Arm Chair exercises at 5 AM as tolerated and additional band stretching exercises and walking daily. Good job! Continue checking BG once a day in AM Continue taking medication as prescribed

## 2017-08-24 DIAGNOSIS — I129 Hypertensive chronic kidney disease with stage 1 through stage 4 chronic kidney disease, or unspecified chronic kidney disease: Secondary | ICD-10-CM | POA: Diagnosis not present

## 2017-08-24 DIAGNOSIS — F319 Bipolar disorder, unspecified: Secondary | ICD-10-CM | POA: Diagnosis not present

## 2017-08-24 DIAGNOSIS — N184 Chronic kidney disease, stage 4 (severe): Secondary | ICD-10-CM | POA: Diagnosis not present

## 2017-08-24 DIAGNOSIS — F419 Anxiety disorder, unspecified: Secondary | ICD-10-CM | POA: Diagnosis not present

## 2017-08-24 DIAGNOSIS — E782 Mixed hyperlipidemia: Secondary | ICD-10-CM | POA: Diagnosis not present

## 2017-08-24 DIAGNOSIS — G4733 Obstructive sleep apnea (adult) (pediatric): Secondary | ICD-10-CM | POA: Diagnosis not present

## 2017-08-24 DIAGNOSIS — D509 Iron deficiency anemia, unspecified: Secondary | ICD-10-CM | POA: Diagnosis not present

## 2017-08-24 DIAGNOSIS — E669 Obesity, unspecified: Secondary | ICD-10-CM | POA: Diagnosis not present

## 2017-08-24 DIAGNOSIS — E1122 Type 2 diabetes mellitus with diabetic chronic kidney disease: Secondary | ICD-10-CM | POA: Diagnosis not present

## 2017-08-26 DIAGNOSIS — G4733 Obstructive sleep apnea (adult) (pediatric): Secondary | ICD-10-CM | POA: Diagnosis not present

## 2017-08-26 DIAGNOSIS — N184 Chronic kidney disease, stage 4 (severe): Secondary | ICD-10-CM | POA: Diagnosis not present

## 2017-08-26 DIAGNOSIS — F419 Anxiety disorder, unspecified: Secondary | ICD-10-CM | POA: Diagnosis not present

## 2017-08-26 DIAGNOSIS — D509 Iron deficiency anemia, unspecified: Secondary | ICD-10-CM | POA: Diagnosis not present

## 2017-08-26 DIAGNOSIS — E782 Mixed hyperlipidemia: Secondary | ICD-10-CM | POA: Diagnosis not present

## 2017-08-26 DIAGNOSIS — F319 Bipolar disorder, unspecified: Secondary | ICD-10-CM | POA: Diagnosis not present

## 2017-08-26 DIAGNOSIS — E1122 Type 2 diabetes mellitus with diabetic chronic kidney disease: Secondary | ICD-10-CM | POA: Diagnosis not present

## 2017-08-26 DIAGNOSIS — I129 Hypertensive chronic kidney disease with stage 1 through stage 4 chronic kidney disease, or unspecified chronic kidney disease: Secondary | ICD-10-CM | POA: Diagnosis not present

## 2017-08-26 DIAGNOSIS — E669 Obesity, unspecified: Secondary | ICD-10-CM | POA: Diagnosis not present

## 2017-08-27 DIAGNOSIS — E782 Mixed hyperlipidemia: Secondary | ICD-10-CM | POA: Diagnosis not present

## 2017-08-27 DIAGNOSIS — G4733 Obstructive sleep apnea (adult) (pediatric): Secondary | ICD-10-CM | POA: Diagnosis not present

## 2017-08-27 DIAGNOSIS — I129 Hypertensive chronic kidney disease with stage 1 through stage 4 chronic kidney disease, or unspecified chronic kidney disease: Secondary | ICD-10-CM | POA: Diagnosis not present

## 2017-08-27 DIAGNOSIS — E669 Obesity, unspecified: Secondary | ICD-10-CM | POA: Diagnosis not present

## 2017-08-27 DIAGNOSIS — N184 Chronic kidney disease, stage 4 (severe): Secondary | ICD-10-CM | POA: Diagnosis not present

## 2017-08-27 DIAGNOSIS — E1122 Type 2 diabetes mellitus with diabetic chronic kidney disease: Secondary | ICD-10-CM | POA: Diagnosis not present

## 2017-08-27 DIAGNOSIS — F419 Anxiety disorder, unspecified: Secondary | ICD-10-CM | POA: Diagnosis not present

## 2017-08-27 DIAGNOSIS — D509 Iron deficiency anemia, unspecified: Secondary | ICD-10-CM | POA: Diagnosis not present

## 2017-08-27 DIAGNOSIS — F319 Bipolar disorder, unspecified: Secondary | ICD-10-CM | POA: Diagnosis not present

## 2017-08-29 DIAGNOSIS — G4733 Obstructive sleep apnea (adult) (pediatric): Secondary | ICD-10-CM | POA: Diagnosis not present

## 2017-09-04 DIAGNOSIS — F419 Anxiety disorder, unspecified: Secondary | ICD-10-CM | POA: Diagnosis not present

## 2017-09-04 DIAGNOSIS — I129 Hypertensive chronic kidney disease with stage 1 through stage 4 chronic kidney disease, or unspecified chronic kidney disease: Secondary | ICD-10-CM | POA: Diagnosis not present

## 2017-09-04 DIAGNOSIS — E1122 Type 2 diabetes mellitus with diabetic chronic kidney disease: Secondary | ICD-10-CM | POA: Diagnosis not present

## 2017-09-04 DIAGNOSIS — N184 Chronic kidney disease, stage 4 (severe): Secondary | ICD-10-CM | POA: Diagnosis not present

## 2017-09-04 DIAGNOSIS — G4733 Obstructive sleep apnea (adult) (pediatric): Secondary | ICD-10-CM | POA: Diagnosis not present

## 2017-09-04 DIAGNOSIS — F319 Bipolar disorder, unspecified: Secondary | ICD-10-CM | POA: Diagnosis not present

## 2017-09-04 DIAGNOSIS — E782 Mixed hyperlipidemia: Secondary | ICD-10-CM | POA: Diagnosis not present

## 2017-09-04 DIAGNOSIS — E669 Obesity, unspecified: Secondary | ICD-10-CM | POA: Diagnosis not present

## 2017-09-04 DIAGNOSIS — D509 Iron deficiency anemia, unspecified: Secondary | ICD-10-CM | POA: Diagnosis not present

## 2017-09-22 DIAGNOSIS — F250 Schizoaffective disorder, bipolar type: Secondary | ICD-10-CM | POA: Diagnosis not present

## 2017-09-23 DIAGNOSIS — N183 Chronic kidney disease, stage 3 (moderate): Secondary | ICD-10-CM | POA: Diagnosis not present

## 2017-09-23 DIAGNOSIS — E1122 Type 2 diabetes mellitus with diabetic chronic kidney disease: Secondary | ICD-10-CM | POA: Diagnosis not present

## 2017-09-23 DIAGNOSIS — J209 Acute bronchitis, unspecified: Secondary | ICD-10-CM | POA: Diagnosis not present

## 2017-09-23 DIAGNOSIS — N08 Glomerular disorders in diseases classified elsewhere: Secondary | ICD-10-CM | POA: Diagnosis not present

## 2017-09-26 DIAGNOSIS — G4733 Obstructive sleep apnea (adult) (pediatric): Secondary | ICD-10-CM | POA: Diagnosis not present

## 2017-09-30 ENCOUNTER — Other Ambulatory Visit (HOSPITAL_COMMUNITY): Payer: Self-pay | Admitting: Psychiatry

## 2017-10-01 DIAGNOSIS — F250 Schizoaffective disorder, bipolar type: Secondary | ICD-10-CM | POA: Diagnosis not present

## 2017-10-27 DIAGNOSIS — G4733 Obstructive sleep apnea (adult) (pediatric): Secondary | ICD-10-CM | POA: Diagnosis not present

## 2017-10-27 DIAGNOSIS — H52223 Regular astigmatism, bilateral: Secondary | ICD-10-CM | POA: Diagnosis not present

## 2017-10-29 DIAGNOSIS — N08 Glomerular disorders in diseases classified elsewhere: Secondary | ICD-10-CM | POA: Diagnosis not present

## 2017-10-29 DIAGNOSIS — K029 Dental caries, unspecified: Secondary | ICD-10-CM | POA: Diagnosis not present

## 2017-10-29 DIAGNOSIS — I129 Hypertensive chronic kidney disease with stage 1 through stage 4 chronic kidney disease, or unspecified chronic kidney disease: Secondary | ICD-10-CM | POA: Diagnosis not present

## 2017-10-29 DIAGNOSIS — E782 Mixed hyperlipidemia: Secondary | ICD-10-CM | POA: Diagnosis not present

## 2017-10-29 DIAGNOSIS — E1122 Type 2 diabetes mellitus with diabetic chronic kidney disease: Secondary | ICD-10-CM | POA: Diagnosis not present

## 2017-11-06 DIAGNOSIS — I739 Peripheral vascular disease, unspecified: Secondary | ICD-10-CM | POA: Diagnosis not present

## 2017-11-06 DIAGNOSIS — L84 Corns and callosities: Secondary | ICD-10-CM | POA: Diagnosis not present

## 2017-11-06 DIAGNOSIS — E1151 Type 2 diabetes mellitus with diabetic peripheral angiopathy without gangrene: Secondary | ICD-10-CM | POA: Diagnosis not present

## 2017-11-06 DIAGNOSIS — L603 Nail dystrophy: Secondary | ICD-10-CM | POA: Diagnosis not present

## 2017-11-13 ENCOUNTER — Encounter: Payer: Medicare HMO | Attending: Internal Medicine | Admitting: *Deleted

## 2017-11-13 DIAGNOSIS — E119 Type 2 diabetes mellitus without complications: Secondary | ICD-10-CM

## 2017-11-13 DIAGNOSIS — Z713 Dietary counseling and surveillance: Secondary | ICD-10-CM | POA: Diagnosis not present

## 2017-11-13 NOTE — Patient Instructions (Signed)
Plan:  Aim for 2 Carb Choices per meal (30 grams) +/- 1 either way  Aim for 0-1 Carbs per snack if hungry  Enjoy your vegetables! Include protein in moderation with your meals and snacks Continue reducing your sodium intake to help with water retention and blood pressure Continue doing Arm Chair exercises at 5 AM as tolerated and additional band stretching exercises and walking daily. Good job! Continue checking BG once a day in AM Continue taking medication as prescribed

## 2017-11-13 NOTE — Progress Notes (Signed)
Diabetes Self-Management Education  Visit Type:    Appt. Start Time: 0800 Appt. End Time: 0830  11/13/2017  [name], identified by name and date of birth, is a 67 y.o. female with a diagnosis of Diabetes: Patient is here for 3 month follow up visit. Weight loss of 10 pounds noted! She again  states she has continued to increase her vegetable choices and frequency.  She is still getting up to do Arm Chair exercises at 5 AM and has added band exercises and is walking down he hallway and around her living room for about 10 minutes several times every day.  She states she is still considering getting the [place] CGM. She reports her FBG are running 103-163 mg/dl, they are continuing to improve with ongoing weight loss, better eating habits and increasing activity levels.    ASSESSMENT  Height 5\' 4"  (1.626 m), weight 258 lb 14.4 oz (117.4 kg). Body mass index is 44.44 kg/m.   Individualized Plan for Diabetes Self-Management Training:   Learning Objective:  Patient will have a greater understanding of diabetes self-management. Patient education plan is to attend individual and/or group sessions per assessed needs and concerns.   Plan:   Patient Instructions  Plan:  Aim for 2 Carb Choices per meal (30 grams) +/- 1 either way  Aim for 0-1 Carbs per snack if hungry  Enjoy your vegetables! Include protein in moderation with your meals and snacks Continue reducing your sodium intake to help with water retention and blood pressure Continue doing Arm Chair exercises at 5 AM as tolerated and additional band stretching exercises and walking daily. Good job! Continue checking BG once a day in AM Continue taking medication as prescribed   Expected Outcomes:     Education material provided: Carb Counting handout, Yellow Meal Card, review of [employer] BG Targets  If problems or questions, patient to contact team via:  Phone  Future DSME appointment:  3 months

## 2017-11-25 ENCOUNTER — Other Ambulatory Visit (HOSPITAL_COMMUNITY): Payer: Self-pay | Admitting: Psychiatry

## 2017-11-25 DIAGNOSIS — R251 Tremor, unspecified: Secondary | ICD-10-CM

## 2017-11-26 DIAGNOSIS — G4733 Obstructive sleep apnea (adult) (pediatric): Secondary | ICD-10-CM | POA: Diagnosis not present

## 2017-12-02 DIAGNOSIS — L600 Ingrowing nail: Secondary | ICD-10-CM | POA: Diagnosis not present

## 2017-12-16 DIAGNOSIS — L600 Ingrowing nail: Secondary | ICD-10-CM | POA: Diagnosis not present

## 2017-12-27 DIAGNOSIS — G4733 Obstructive sleep apnea (adult) (pediatric): Secondary | ICD-10-CM | POA: Diagnosis not present

## 2017-12-31 ENCOUNTER — Other Ambulatory Visit: Payer: Self-pay | Admitting: Internal Medicine

## 2017-12-31 DIAGNOSIS — Z1231 Encounter for screening mammogram for malignant neoplasm of breast: Secondary | ICD-10-CM

## 2018-01-02 ENCOUNTER — Other Ambulatory Visit: Payer: Self-pay

## 2018-01-02 ENCOUNTER — Ambulatory Visit (HOSPITAL_COMMUNITY)
Admission: EM | Admit: 2018-01-02 | Discharge: 2018-01-02 | Disposition: A | Discharge status: Home / Self Care | Payer: Medicare HMO | Attending: Family | Admitting: Family

## 2018-01-02 ENCOUNTER — Encounter (HOSPITAL_COMMUNITY): Payer: Self-pay

## 2018-01-02 DIAGNOSIS — K047 Periapical abscess without sinus: Secondary | ICD-10-CM | POA: Diagnosis not present

## 2018-01-02 MED ORDER — AMOXICILLIN-POT CLAVULANATE 875-125 MG PO TABS: 1.0000 | ORAL_TABLET | Freq: Two times a day (BID) | ORAL | 0 refills | Status: DC

## 2018-01-02 NOTE — ED Triage Notes (Signed)
Pt presents with complaints of tooth ache and infection that has spread up to her eye x 1 day. Sudden increase in swelling this am.

## 2018-01-02 NOTE — Discharge Instructions (Signed)
As discussed, suspect dental caries, decay as source of pain.  Will cover you with 7-day course of antibiotics.  Please start probiotics as well.  Please take Augmentin with food as this can cause GI distress, diarrhea. As also discussed at great length, very important that you return here tomorrow for follow-up appointment to ensure infection is improving as abscesses can become quite serious, even life-threatening . They can be complicated by skin infection around the eye which would require IV antibiotics and emergency room visit. You may take ibuprofen or Tylenol for pain.  Cold cool compress for pain relief as well.  Any new or worsening symptoms, again, you would need evaluation promptly.   Please ensure you follow-up with your dentist.

## 2018-01-02 NOTE — ED Provider Notes (Signed)
[facility]    CSN: [id number] Arrival date & time: 01/02/18  1204     History   Chief Complaint Chief Complaint  Patient presents with  . Dental Pain    HPI Kimberly Gross is a 67 y.o. female.   Chief complaint right lower dental pain x1 day, unchanged.  Also reports right upper tooth also hurting. Throbbing. Reports swelling towards right eye which she noticed today. No facial tenderness, rash. No eye pain, changes in vision, photophobia. Able to eat soft foods, teeth not bothered by hot/cold No fever, N, V, chills, purulent discharge, sinus pain, sore throat, ear pain.   Saw oral surgeon 3 months ago. Reports planning dental extraction removal however surgeon declined since a1c was 9 at that time. Reports now 8.   History of diabetes, hypertension, bipolar.  No CP, sob.      Past Medical History:  Diagnosis Date  . Anemia   . Anxiety   . Bipolar 1 disorder ([place])   . Depression   . Diabetes mellitus without complication ([place])   . Hyperlipemia   . Hypertension   . Occasional tremors     Patient Active Problem List   Diagnosis Date Noted  . Super obese 01/22/2017  . OSA treated with BiPAP 01/22/2017  . Tremor 05/08/2015  . Anxiety state 05/08/2015  . Medication-induced postural tremor 05/08/2015  . Bipolar 1 disorder, mixed, moderate ([place]) 05/08/2015    Past Surgical History:  Procedure Laterality Date  . VAGINAL HYSTERECTOMY      OB History   None      Home Medications    Prior to Admission medications   Medication Sig Start Date End Date Taking? Authorizing Provider  ACCU-CHEK SOFTCLIX LANCETS lancets  02/06/15   [provider]  Alcohol Swabs (B-D SINGLE USE SWABS REGULAR) PADS  02/06/15   [provider]  amLODipine (NORVASC) 10 MG tablet Take 10 mg by mouth daily. 12/02/16   [provider]  amoxicillin-clavulanate (AUGMENTIN) 875-125 MG tablet Take 1 tablet by mouth 2 (two) times daily. 01/02/18   Burnard Hawthorne, FNP  aspirin 81 MG tablet Take 81 mg by mouth once. 05/01/15   [provider]  atorvastatin (LIPITOR) 40 MG tablet Take 40 mg by mouth at bedtime. 12/07/16   [provider]  Blood Glucose Monitoring Suppl (ACCU-CHEK AVIVA PLUS) W/DEVICE KIT  02/06/15   [provider]  clonazePAM (KLONOPIN) 0.5 MG tablet Take 1 tablet (0.5 mg total) by mouth 2 (two) times daily. 05/08/15   Garvin Fila, MD  divalproex (DEPAKOTE) 500 MG DR tablet  04/09/15   [provider]  Dulaglutide (TRULICITY) 6.27 OJ/5.0KX SOPN Inject into the skin once a week.    [provider]  fenofibrate 160 MG tablet  03/29/15   [provider]  flunisolide (NASALIDE) 25 MCG/ACT (0.025%) SOLN Place 2 sprays into the nose 2 (two) times daily. 09/03/16   Dohmeier, Asencion Partridge, MD  furosemide (LASIX) 40 MG tablet  03/03/15   [provider]  Insulin Degludec-Liraglutide (XULTOPHY) 100-3.6 UNIT-MG/ML SOPN Inject 25 Units into the skin.    [provider]  insulin glargine (LANTUS) 100 UNIT/ML injection Inject 50 Units into the skin at bedtime.    [provider]  LANTUS SOLOSTAR 100 UNIT/ML Solostar Pen  01/21/17   [provider]  Liraglutide (VICTOZA) 18 MG/3ML SOPN Inject 10 mg into the skin daily.    [provider]  Multiple Vitamins-Minerals (MULTIVITAMIN ADULT  PO) Use as directed 1 tablet in the mouth or throat daily. 04/27/15   [provider]  NOVOLOG FLEXPEN 100 UNIT/ML FlexPen INJECT 30 UNITS SUBCUTANEOUSLY DAILY 12/10/16   [provider]  NOVOLOG PENFILL cartridge  04/18/15   [provider]  traZODone (DESYREL) 100 MG tablet  08/21/16   [provider]  triamcinolone lotion (KENALOG) 0.1 % APP EXT TO THE SCALP BID SPARINGLY FOR ITCHING OR IRRITATION 03/26/15   [provider]  Vitamin D, Ergocalciferol, (DRISDOL) 50000 UNITS CAPS capsule  03/29/15   [provider]    Family  History Family History  Problem Relation Age of Onset  . Hypertension Mother   . Heart attack Father     Social History Social History   Tobacco Use  . Smoking status: Never Smoker  . Smokeless tobacco: Never Used  Substance Use Topics  . Alcohol use: No  . Drug use: No     Allergies   Chlorpromazine   Review of Systems Review of Systems  Constitutional: Negative for chills and fever.  HENT: Positive for dental problem and facial swelling. Negative for congestion, ear pain, hearing loss, sinus pain, sore throat, trouble swallowing and voice change.   Eyes: Negative for photophobia, discharge and visual disturbance.  Respiratory: Negative for cough and shortness of breath.   Cardiovascular: Negative for chest pain and palpitations.  Gastrointestinal: Negative for nausea and vomiting.     Physical Exam Triage Vital Signs ED Triage Vitals  Enc Vitals Group     BP 01/02/18 1218 136/86     Pulse Rate 01/02/18 1218 92     Resp 01/02/18 1218 20     Temp 01/02/18 1218 98.6 F (37 C)     Temp src --      SpO2 01/02/18 1218 98 %     Weight --      Height --      Head Circumference --      Peak Flow --      Pain Score 01/02/18 1219 9     Pain Loc --      Pain Edu? --      Excl. in [place]? --    No data found.  Updated Vital Signs BP 136/86   Pulse 92   Temp 98.6 F (37 C)   Resp 20   SpO2 98%   Visual Acuity Right Eye Distance:   Left Eye Distance:   Bilateral Distance:    Right Eye Near:   Left Eye Near:    Bilateral Near:     Physical Exam  Constitutional: Vital signs are normal. She appears well-developed and well-nourished.  HENT:  Head: Normocephalic and atraumatic.    Right Ear: Hearing, tympanic membrane, external ear and ear canal normal. No drainage, swelling or tenderness. No foreign bodies. Tympanic membrane is not erythematous and not bulging. No middle ear effusion. No decreased hearing is noted.  Left Ear: Hearing, tympanic membrane,  external ear and ear canal normal. No drainage, swelling or tenderness. No foreign bodies. Tympanic membrane is not erythematous and not bulging.  No middle ear effusion. No decreased hearing is noted.  Nose: Nose normal. No rhinorrhea. Right sinus exhibits no maxillary sinus tenderness and no frontal sinus tenderness. Left sinus exhibits no maxillary sinus tenderness and no frontal sinus tenderness.  Mouth/Throat: Uvula is midline, oropharynx is clear and moist and mucous membranes are normal. Dental caries present. No uvula swelling. No oropharyngeal exudate, posterior oropharyngeal edema, posterior oropharyngeal erythema  or tonsillar abscesses.    Poor dentition as noted on diagram.  See images.  No purulent discharge, foul odor or smell on exam.  Tenderness with palpation of decayed tooth.  Right side of face appears mildly larger than left. A very slight erythema ( see image)  noted under right eye. Skin intact. No increased warmth, edema over right orbit, or streaking appreciated.   Eyes: Pupils are equal, round, and reactive to light. Conjunctivae, EOM and lids are normal. Lids are everted and swept, no foreign bodies found.  Cardiovascular: Normal rate, regular rhythm, normal heart sounds and normal pulses.  Pulmonary/Chest: Effort normal and breath sounds normal. She has no wheezes. She has no rhonchi. She has no rales.  Lymphadenopathy:       Head (right side): No submental, no submandibular, no tonsillar, no preauricular, no posterior auricular and no occipital adenopathy present.       Head (left side): No submental, no submandibular, no tonsillar, no preauricular, no posterior auricular and no occipital adenopathy present.    She has no cervical adenopathy.       Right cervical: No superficial cervical, no deep cervical and no posterior cervical adenopathy present.      Left cervical: No superficial cervical, no deep cervical and no posterior cervical adenopathy present.  Neurological:  She is alert. She has normal strength. No cranial nerve deficit or sensory deficit. She displays a negative Romberg sign.  Reflex Scores:      Bicep reflexes are 2+ on the right side and 2+ on the left side.      Patellar reflexes are 2+ on the right side and 2+ on the left side. Grip equal and strong bilateral upper extremities. Gait strong and steady. Able to perform finger-to-nose without difficulty.    Skin: Skin is warm and dry.  Psychiatric: She has a normal mood and affect. Her speech is normal and behavior is normal. Thought content normal.  Vitals reviewed.          UC Treatments / Results  Labs (all labs ordered are listed, but only abnormal results are displayed) Labs Reviewed - No data to display  EKG None  Radiology No results found.  Procedures Procedures (including critical care time)  Medications Ordered in UC Medications - No data to display  Initial Impression / Assessment and Plan / UC Course  I have reviewed the triage vital signs and the nursing notes.  Pertinent labs & imaging results that were available during my care of the patient were reviewed by me and considered in my medical decision making (see chart for details).      Final Clinical Impressions(s) / UC Diagnoses   Final diagnoses:  Dental abscess  Patient is well-appearing.  She is afebrile.  Duration of symptoms is 1 day.  Exam is reassuring.  No evidence of orbital cellulitis on exam today.  Reassured by normal extraocular movements, neurologic exam.  Has a history of poor dentition, pending extraction per patient.  At this time, had a long discussion with patient that I suspect all antibiotics would be appropriate at this time.  She understands if any worsening of pain, swelling, she is to go immediately to the emergency room as she may need IV antibiotics.  Education provided on serious sequela of dental abscess including orbital cellulitis.  Patient advised to make an appointment at  checkout for follow-up tomorrow in our clinic to ensure she is improving.  Return precautions given   Discharge Instructions  As discussed, suspect dental caries, decay as source of pain.  Will cover you with 7-day course of antibiotics.  Please start probiotics as well.  Please take Augmentin with food as this can cause GI distress, diarrhea. As also discussed at great length, very important that you return here tomorrow for follow-up appointment to ensure infection is improving as abscesses can become quite serious, even life-threatening . They can be complicated by skin infection around the eye which would require IV antibiotics and emergency room visit. You may take ibuprofen or Tylenol for pain.  Cold cool compress for pain relief as well.  Any new or worsening symptoms, again, you would need evaluation promptly.   Please ensure you follow-up with your dentist.    ED Prescriptions    Medication Sig Dispense Auth. Provider   amoxicillin-clavulanate (AUGMENTIN) 875-125 MG tablet Take 1 tablet by mouth 2 (two) times daily. 14 tablet Burnard Hawthorne, FNP     Controlled Substance Prescriptions [place] Controlled Substance Registry consulted? Not Applicable   Burnard Hawthorne, FNP 01/02/18 1320

## 2018-01-03 ENCOUNTER — Ambulatory Visit (HOSPITAL_COMMUNITY)
Admission: EM | Admit: 2018-01-03 | Discharge: 2018-01-03 | Disposition: A | Discharge status: Home / Self Care | Payer: Medicare HMO | Attending: Family Medicine | Admitting: Family Medicine

## 2018-01-03 ENCOUNTER — Encounter (HOSPITAL_COMMUNITY): Payer: Self-pay | Admitting: *Deleted

## 2018-01-03 DIAGNOSIS — K0889 Other specified disorders of teeth and supporting structures: Secondary | ICD-10-CM | POA: Diagnosis not present

## 2018-01-03 NOTE — ED Notes (Signed)
Bed: UC01 Expected date:  Expected time:  Means of arrival:  Comments: For appointments 

## 2018-01-03 NOTE — ED Provider Notes (Signed)
[place]    CSN: [id number] Arrival date & time: 01/03/18  1300     History   Chief Complaint Chief Complaint  Patient presents with  . Follow-up    HPI Kimberly Gross is a 67 y.o. female.   Was seem yesterday in our Urgent Care for dental pain accompanied by right facial swelling and was given Augmentin and instructed to return today for follow up. Patient have started the antibiotic. She reports feeling better. Pain has gone down. Right facial swelling also improving. She has appointment scheduled with her PCP for HgbA1C check, then she will be able to see her dentist. She has no new complalint today.      Past Medical History:  Diagnosis Date  . Anemia   . Anxiety   . Bipolar 1 disorder ([place])   . Depression   . Diabetes mellitus without complication ([place])   . Hyperlipemia   . Hypertension   . Occasional tremors     Patient Active Problem List   Diagnosis Date Noted  . Super obese 01/22/2017  . OSA treated with BiPAP 01/22/2017  . Tremor 05/08/2015  . Anxiety state 05/08/2015  . Medication-induced postural tremor 05/08/2015  . Bipolar 1 disorder, mixed, moderate ([place]) 05/08/2015    Past Surgical History:  Procedure Laterality Date  . VAGINAL HYSTERECTOMY      OB History   None      Home Medications    Prior to Admission medications   Medication Sig Start Date End Date Taking? Authorizing Provider  amLODipine (NORVASC) 10 MG tablet Take 10 mg by mouth daily. 12/02/16  Yes [provider]  amoxicillin-clavulanate (AUGMENTIN) 875-125 MG tablet Take 1 tablet by mouth 2 (two) times daily. 01/02/18  Yes Burnard Hawthorne, FNP  aspirin 81 MG tablet Take 81 mg by mouth once. 05/01/15  Yes [provider]  atorvastatin (LIPITOR) 40 MG tablet Take 40 mg by mouth at bedtime. 12/07/16  Yes [provider]  clonazePAM (KLONOPIN) 0.5 MG tablet Take 1 tablet (0.5 mg total) by mouth 2 (two) times daily. 05/08/15  Yes Garvin Fila, MD  divalproex (DEPAKOTE) 500 MG DR tablet  04/09/15  Yes [provider]  Dulaglutide (TRULICITY) 3.76 EG/3.1DV SOPN Inject into the skin once a week.   Yes [provider]  fenofibrate 160 MG tablet  03/29/15  Yes [provider]  furosemide (LASIX) 40 MG tablet  03/03/15  Yes [provider]  Insulin Degludec-Liraglutide (XULTOPHY) 100-3.6 UNIT-MG/ML SOPN Inject 25 Units into the skin.   Yes [provider]  ACCU-CHEK SOFTCLIX LANCETS lancets  02/06/15   [provider]  Alcohol Swabs (B-D SINGLE USE SWABS REGULAR) PADS  02/06/15   [provider]  Blood Glucose Monitoring Suppl (ACCU-CHEK AVIVA PLUS) W/DEVICE KIT  02/06/15   [provider]  Multiple Vitamins-Minerals (MULTIVITAMIN ADULT PO) Use as directed 1 tablet in the mouth or throat daily. 04/27/15   [provider]  triamcinolone lotion (KENALOG) 0.1 % APP EXT TO THE SCALP BID SPARINGLY FOR ITCHING OR IRRITATION 03/26/15   [provider]  Vitamin D, Ergocalciferol, (DRISDOL) 50000 UNITS CAPS capsule  03/29/15   [provider]    Family History Family History  Problem Relation Age of Onset  . Hypertension Mother   . Heart attack Father     Social History Social History   Tobacco Use  . Smoking status: Never Smoker  . Smokeless tobacco: Never Used  Substance  Use Topics  . Alcohol use: No  . Drug use: No     Allergies   Chlorpromazine and Trazodone and nefazodone   Review of Systems Review of Systems  Constitutional:       See HPI     Physical Exam Triage Vital Signs ED Triage Vitals  Enc Vitals Group     BP 01/03/18 1311 (!) 137/92     Pulse Rate 01/03/18 1311 (!) 111     Resp 01/03/18 1311 20     Temp 01/03/18 1311 98.4 F (36.9 C)     Temp Source 01/03/18 1311 Oral     SpO2 01/03/18 1311 95 %     Weight --      Height --      Head Circumference --      Peak Flow --      Pain Score 01/03/18 1314 8      Pain Loc --      Pain Edu? --      Excl. in [place]? --    No data found.  Updated Vital Signs BP (!) 137/92 (BP Location: Right Arm)   Pulse (!) 111   Temp 98.4 F (36.9 C) (Oral)   Resp 20   SpO2 95%   Visual Acuity Right Eye Distance:   Left Eye Distance:   Bilateral Distance:    Right Eye Near:   Left Eye Near:    Bilateral Near:     Physical Exam  Constitutional: She is oriented to person, place, and time. She appears well-developed and well-nourished.  HENT:  Head: Normocephalic and atraumatic.  Poor dentition noted. Missing teeth and decay teeth noted. Patient still has some soreness along the right upper gumline, no pain on right lower gumline. No abscess palpated or suspected. Facial swelling appears better compared to yesterday.   Cardiovascular: Normal rate, regular rhythm and normal heart sounds.  Pulmonary/Chest: Effort normal and breath sounds normal. She has no wheezes.  Abdominal: Soft. Bowel sounds are normal. There is no tenderness.  Neurological: She is alert and oriented to person, place, and time.  Skin: Skin is warm and dry.  Nursing note and vitals reviewed.    UC Treatments / Results  Labs (all labs ordered are listed, but only abnormal results are displayed) Labs Reviewed - No data to display  EKG None  Radiology No results found.  Procedures Procedures (including critical care time)  Medications Ordered in UC Medications - No data to display  Initial Impression / Assessment and Plan / UC Course  I have reviewed the triage vital signs and the nursing notes.  Pertinent labs & imaging results that were available during my care of the patient were reviewed by me and considered in my medical decision making (see chart for details).  Final Clinical Impressions(s) / UC Diagnoses   Final diagnoses:  Pain, dental  Yesterday visit note reviewed.  Patient is improving.  Please f/u with your PCP and your Dentist as scheduled.  Please  finish your antibiotic.  Return as needed   Discharge Instructions   None    ED Prescriptions    None     Controlled Substance Prescriptions [place] Controlled Substance Registry consulted? Not Applicable   Barry Dienes, NP 01/03/18 1331

## 2018-01-03 NOTE — ED Triage Notes (Signed)
Pt seen yesterday for dental abscess; was told to recheck today.  Pt states she feels she is starting to improve.  Has been taking abx as directed.

## 2018-01-06 DIAGNOSIS — L600 Ingrowing nail: Secondary | ICD-10-CM | POA: Diagnosis not present

## 2018-01-20 DIAGNOSIS — F250 Schizoaffective disorder, bipolar type: Secondary | ICD-10-CM | POA: Diagnosis not present

## 2018-01-26 DIAGNOSIS — G4733 Obstructive sleep apnea (adult) (pediatric): Secondary | ICD-10-CM | POA: Diagnosis not present

## 2018-01-28 DIAGNOSIS — N08 Glomerular disorders in diseases classified elsewhere: Secondary | ICD-10-CM | POA: Diagnosis not present

## 2018-01-28 DIAGNOSIS — Z139 Encounter for screening, unspecified: Secondary | ICD-10-CM | POA: Diagnosis not present

## 2018-01-28 DIAGNOSIS — I129 Hypertensive chronic kidney disease with stage 1 through stage 4 chronic kidney disease, or unspecified chronic kidney disease: Secondary | ICD-10-CM | POA: Diagnosis not present

## 2018-01-28 DIAGNOSIS — E1122 Type 2 diabetes mellitus with diabetic chronic kidney disease: Secondary | ICD-10-CM | POA: Diagnosis not present

## 2018-01-28 DIAGNOSIS — N184 Chronic kidney disease, stage 4 (severe): Secondary | ICD-10-CM | POA: Diagnosis not present

## 2018-01-28 DIAGNOSIS — E782 Mixed hyperlipidemia: Secondary | ICD-10-CM | POA: Diagnosis not present

## 2018-01-28 LAB — BASIC METABOLIC PANEL
BUN: 30 — AB (ref 4–21)
Creatinine: 2.2 — AB (ref 0.5–1.1)
GLUCOSE: 104
Potassium: 4.7 (ref 3.4–5.3)
Sodium: 147 (ref 137–147)

## 2018-01-28 LAB — LIPID PANEL
Cholesterol: 197 (ref 0–200)
HDL: 53 (ref 35–70)
LDL CALC: 108
LDL/HDL RATIO: 2
Triglycerides: 182 — AB (ref 40–160)

## 2018-01-28 LAB — HEPATIC FUNCTION PANEL
ALT: 27 (ref 7–35)
AST: 25 (ref 13–35)
Alkaline Phosphatase: 44 (ref 25–125)

## 2018-01-28 LAB — HEMOGLOBIN A1C: Hgb A1c MFr Bld: 5.7 (ref 4.0–6.0)

## 2018-01-28 LAB — TSH: TSH: 1.73 (ref 0.41–5.90)

## 2018-02-01 DIAGNOSIS — F250 Schizoaffective disorder, bipolar type: Secondary | ICD-10-CM | POA: Diagnosis not present

## 2018-02-12 ENCOUNTER — Ambulatory Visit: Payer: Medicare Other | Admitting: *Deleted

## 2018-02-15 ENCOUNTER — Ambulatory Visit
Admission: RE | Admit: 2018-02-15 | Discharge: 2018-02-15 | Disposition: A | Discharge status: Home / Self Care | Payer: Medicare HMO | Source: Ambulatory Visit | Attending: Internal Medicine | Admitting: Internal Medicine

## 2018-02-15 DIAGNOSIS — Z1231 Encounter for screening mammogram for malignant neoplasm of breast: Secondary | ICD-10-CM

## 2018-02-15 DIAGNOSIS — Z780 Asymptomatic menopausal state: Secondary | ICD-10-CM | POA: Diagnosis not present

## 2018-02-15 DIAGNOSIS — E2839 Other primary ovarian failure: Secondary | ICD-10-CM

## 2018-02-15 DIAGNOSIS — Z13820 Encounter for screening for osteoporosis: Secondary | ICD-10-CM | POA: Diagnosis not present

## 2018-02-16 DIAGNOSIS — G4733 Obstructive sleep apnea (adult) (pediatric): Secondary | ICD-10-CM | POA: Diagnosis not present

## 2018-02-18 DIAGNOSIS — I129 Hypertensive chronic kidney disease with stage 1 through stage 4 chronic kidney disease, or unspecified chronic kidney disease: Secondary | ICD-10-CM | POA: Diagnosis not present

## 2018-02-18 DIAGNOSIS — E1122 Type 2 diabetes mellitus with diabetic chronic kidney disease: Secondary | ICD-10-CM | POA: Diagnosis not present

## 2018-02-18 DIAGNOSIS — N261 Atrophy of kidney (terminal): Secondary | ICD-10-CM | POA: Diagnosis not present

## 2018-02-18 DIAGNOSIS — N184 Chronic kidney disease, stage 4 (severe): Secondary | ICD-10-CM | POA: Diagnosis not present

## 2018-02-18 DIAGNOSIS — Z6841 Body Mass Index (BMI) 40.0 and over, adult: Secondary | ICD-10-CM | POA: Diagnosis not present

## 2018-02-18 DIAGNOSIS — G4730 Sleep apnea, unspecified: Secondary | ICD-10-CM | POA: Diagnosis not present

## 2018-02-26 DIAGNOSIS — G4733 Obstructive sleep apnea (adult) (pediatric): Secondary | ICD-10-CM | POA: Diagnosis not present

## 2018-03-05 ENCOUNTER — Ambulatory Visit: Payer: Medicare Other | Admitting: *Deleted

## 2018-03-26 ENCOUNTER — Encounter: Payer: Medicare HMO | Attending: Internal Medicine | Admitting: *Deleted

## 2018-03-26 DIAGNOSIS — Z713 Dietary counseling and surveillance: Secondary | ICD-10-CM | POA: Insufficient documentation

## 2018-03-26 DIAGNOSIS — E119 Type 2 diabetes mellitus without complications: Secondary | ICD-10-CM

## 2018-03-26 DIAGNOSIS — E1122 Type 2 diabetes mellitus with diabetic chronic kidney disease: Secondary | ICD-10-CM | POA: Insufficient documentation

## 2018-03-26 NOTE — Patient Instructions (Addendum)
Plan:   Aim for 2 Carb Choices per meal (30 grams) +/- 1 either way   Aim for 0-1 Carbs per snack if hungry   Enjoy your vegetables!  Include protein in moderation with your meals and snacks  Continue reducing your salt intake to help with water retention and blood pressure  Continue doing Arm Chair exercises for 15-30 minutes daily as well as Zumba on Tuesday and Thursday as tolerated   Continue with the band stretching exercises and walking daily too. Good job!  I'm so glad you are feeling better, more energetic.   Continue checking BG once a day in AM  Continue taking medication as prescribed

## 2018-03-26 NOTE — Progress Notes (Signed)
Diabetes Self-Management Education  Visit Type:  Follow-up  Appt. Start Time: 0810 Appt. End Time: 0840  03/26/2018  [name], identified by name and date of birth, is a 67 y.o. female with a diagnosis of Diabetes: Patient is here for 3 month follow up visit. No weight loss this visit but she states her A1c is down to 6.5%, the lowest it has ever been!   She is proud of her continued exercises, stating she goes to [place] every Tuesday and Thursday, and she has the energy to do more for herself now; dress and bathe herself, do the laundry and clean her home. She again  states she has continued to increase her vegetable choices and frequency. She has been to the dentist and with her improved A1c she is able to get the dental care (teeth extractions) that she needs.  She reports her FBG are running 103-163 mg/dl, they are continuing to improve with ongoing weight loss, better eating habits and increasing activity levels.   .   ASSESSMENT  Height 5\' 4"  (1.626 m), weight 264 lb (119.7 kg). Body mass index is 45.32 kg/m.   Diabetes Self-Management Education - 03/26/18 0854      Psychosocial Assessment   Patient Belief/Attitude about Diabetes  Motivated to manage diabetes    Self-management support  Family    Patient Concerns  Nutrition/Meal planning;Weight Control;Glycemic Control    Special Needs  Simplified materials    Learning Readiness  Change in progress      Complications   Fasting Blood glucose range (mg/dL)  70-129;130-179    Have you had a dental exam in the past 12 months?  Yes      Exercise   Exercise Type  Light (walking / raking leaves)    How many days per week to you exercise?  4    How many minutes per day do you exercise?  30    Total minutes per week of exercise  120      Patient Self-Evaluation of Goals - Patient rates self as meeting previously set goals (% of time)   Nutrition  50 - 75 %    Physical Activity  >75%    Medications  >75%    Monitoring   >75%    Problem Solving  >75%    Reducing Risk  >75%      Outcomes   Program Status  Completed      Subsequent Visit   Since your last visit have you continued or begun to take your medications as prescribed?  Yes    Since your last visit, are you checking your blood glucose at least once a day?  Yes       Learning Objective:  Patient will have a greater understanding of diabetes self-management. Patient education plan is to attend individual and/or group sessions per assessed needs and concerns.   Plan:   Patient Instructions  Plan:   Aim for 2 Carb Choices per meal (30 grams) +/- 1 either way   Aim for 0-1 Carbs per snack if hungry   Enjoy your vegetables!  Include protein in moderation with your meals and snacks  Continue reducing your salt intake to help with water retention and blood pressure  Continue doing Arm Chair exercises for 15-30 minutes daily as well as Zumba on Tuesday and Thursday as tolerated   Continue with the band stretching exercises and walking daily too. Good job!  I'm so glad you are feeling better, more  energetic.   Continue checking BG once a day in AM  Continue taking medication as prescribed   Expected Outcomes:  Demonstrated interest in learning. Expect positive outcomes  Education material provided: No new handouts today  If problems or questions, patient to contact team via:  Phone  Future DSME appointment: - 3-4 months

## 2018-03-29 DIAGNOSIS — G4733 Obstructive sleep apnea (adult) (pediatric): Secondary | ICD-10-CM | POA: Diagnosis not present

## 2018-04-05 ENCOUNTER — Telehealth: Payer: Self-pay | Admitting: Internal Medicine

## 2018-04-05 NOTE — Telephone Encounter (Signed)
I left a message letting the patient know that her 08/06/18 appointment has been changed to 08/05/18. VDM (DD)

## 2018-04-17 DIAGNOSIS — R69 Illness, unspecified: Secondary | ICD-10-CM | POA: Diagnosis not present

## 2018-04-28 DIAGNOSIS — G4733 Obstructive sleep apnea (adult) (pediatric): Secondary | ICD-10-CM | POA: Diagnosis not present

## 2018-04-29 ENCOUNTER — Telehealth: Payer: Self-pay

## 2018-04-29 NOTE — Telephone Encounter (Signed)
Pt called requesting a sample of Xultophy she stated she is going out of town and her insulin has not came in from [facility] yet.  Left pt v/m to call office back so I can notify her we have a sample here at the office ready for her to pick up and to also advise her to call for her insulin ahead of time before she runs out so she is not going without. YRL,RMA

## 2018-05-05 ENCOUNTER — Encounter: Payer: Self-pay | Admitting: Nurse Practitioner

## 2018-05-05 DIAGNOSIS — E785 Hyperlipidemia, unspecified: Secondary | ICD-10-CM | POA: Insufficient documentation

## 2018-05-05 DIAGNOSIS — E782 Mixed hyperlipidemia: Secondary | ICD-10-CM

## 2018-05-06 ENCOUNTER — Telehealth: Payer: Self-pay

## 2018-05-06 ENCOUNTER — Ambulatory Visit: Payer: Medicare Other | Admitting: Nurse Practitioner

## 2018-05-06 NOTE — Telephone Encounter (Signed)
Called pt to reschedule her appt and also advise her that her sample of Xultophy is ready to be picked up. YRL,RMA

## 2018-05-13 DIAGNOSIS — F250 Schizoaffective disorder, bipolar type: Secondary | ICD-10-CM | POA: Diagnosis not present

## 2018-05-14 ENCOUNTER — Ambulatory Visit (INDEPENDENT_AMBULATORY_CARE_PROVIDER_SITE_OTHER): Payer: Medicare HMO | Admitting: Nurse Practitioner

## 2018-05-14 ENCOUNTER — Encounter: Payer: Self-pay | Admitting: Nurse Practitioner

## 2018-05-14 VITALS — BP 132/84 | HR 97 | Temp 97.7°F | Ht 61.5 in | Wt 257.8 lb

## 2018-05-14 DIAGNOSIS — J069 Acute upper respiratory infection, unspecified: Secondary | ICD-10-CM | POA: Insufficient documentation

## 2018-05-14 DIAGNOSIS — E785 Hyperlipidemia, unspecified: Secondary | ICD-10-CM | POA: Diagnosis not present

## 2018-05-14 DIAGNOSIS — I10 Essential (primary) hypertension: Secondary | ICD-10-CM | POA: Insufficient documentation

## 2018-05-14 DIAGNOSIS — Z794 Long term (current) use of insulin: Secondary | ICD-10-CM

## 2018-05-14 DIAGNOSIS — E119 Type 2 diabetes mellitus without complications: Secondary | ICD-10-CM | POA: Diagnosis not present

## 2018-05-14 LAB — BMP8+EGFR
BUN / CREAT RATIO: 16 (ref 12–28)
BUN: 32 mg/dL — ABNORMAL HIGH (ref 8–27)
CO2: 22 mmol/L (ref 20–29)
Calcium: 9.8 mg/dL (ref 8.7–10.3)
Chloride: 102 mmol/L (ref 96–106)
Creatinine, Ser: 2.06 mg/dL — ABNORMAL HIGH (ref 0.57–1.00)
GFR calc Af Amer: 28 mL/min/{1.73_m2} — ABNORMAL LOW (ref >59)
GFR, EST NON AFRICAN AMERICAN: 24 mL/min/{1.73_m2} — AB (ref >59)
Glucose: 91 mg/dL (ref 65–99)
POTASSIUM: 4.7 mmol/L (ref 3.5–5.2)
SODIUM: 142 mmol/L (ref 134–144)

## 2018-05-14 LAB — LIPID PANEL
CHOL/HDL RATIO: 3.4 ratio (ref 0.0–4.4)
Cholesterol, Total: 168 mg/dL (ref 100–199)
HDL: 50 mg/dL (ref >39)
LDL Calculated: 87 mg/dL (ref 0–99)
TRIGLYCERIDES: 156 mg/dL — AB (ref 0–149)
VLDL Cholesterol Cal: 31 mg/dL (ref 5–40)

## 2018-05-14 LAB — HEMOGLOBIN A1C
Est. average glucose Bld gHb Est-mCnc: 126 mg/dL
HEMOGLOBIN A1C: 6 % — AB (ref 4.8–5.6)

## 2018-05-14 NOTE — Progress Notes (Addendum)
Subjective:     Patient ID: Kimberly Gross , female    DOB: Jun 19, 1951 , 67 y.o.   MRN: [id number]   Chief Complaint  Patient presents with  . Diabetes    HPI  Diabetes  She presents for her follow-up diabetic visit. She has type 2 diabetes mellitus. No MedicAlert identification noted. Her disease course has been improving. Pertinent negatives for hypoglycemia include no dizziness or headaches. Pertinent negatives for diabetes include no fatigue, no polydipsia, no polyphagia and no polyuria. Symptoms are stable. Risk factors for coronary artery disease include obesity, hypertension, dyslipidemia and diabetes mellitus. Current diabetic treatment includes insulin injections and oral agent (dual therapy). She is following a generally healthy diet. When asked about meal planning, she reported none. She has had a previous visit with a dietitian. She participates in exercise three times a week. Her home blood glucose trend is decreasing steadily. Her overall blood glucose range is 130-140 mg/dl. An ACE inhibitor/angiotensin II receptor blocker is being taken. Eye exam is current.  Hypertension  This is a chronic problem. The current episode started more than 1 year ago. The problem is unchanged. The problem is controlled. Pertinent negatives include no headaches. Past treatments include ACE inhibitors. There are no compliance problems.   URI   This is a new problem. The current episode started today. The problem has been unchanged. There has been no fever. Associated symptoms include coughing and rhinorrhea. Pertinent negatives include no headaches. She has tried nothing for the symptoms. The treatment provided no relief.     Past Medical History:  Diagnosis Date  . Anemia   . Anxiety   . Bipolar 1 disorder ([place])   . Depression   . Diabetes mellitus without complication ([place])   . Hyperlipemia   . Hypertension   . Occasional tremors      Family History  Problem Relation Age of Onset  .  Hypertension Mother   . Heart attack Father      Current Outpatient Medications:  .  ACCU-CHEK SOFTCLIX LANCETS lancets, , Disp: , Rfl:  .  Alcohol Swabs (B-D SINGLE USE SWABS REGULAR) PADS, , Disp: , Rfl:  .  amLODipine (NORVASC) 10 MG tablet, Take 10 mg by mouth daily., Disp: , Rfl: 1 .  aspirin 81 MG tablet, Take 81 mg by mouth once., Disp: , Rfl:  .  atorvastatin (LIPITOR) 40 MG tablet, Take 40 mg by mouth at bedtime., Disp: , Rfl: 1 .  Blood Glucose Monitoring Suppl (ACCU-CHEK AVIVA PLUS) W/DEVICE KIT, , Disp: , Rfl:  .  clonazePAM (KLONOPIN) 0.5 MG tablet, Take 1 tablet (0.5 mg total) by mouth 2 (two) times daily., Disp: 30 tablet, Rfl: 0 .  divalproex (DEPAKOTE) 500 MG DR tablet, , Disp: , Rfl:  .  Dulaglutide (TRULICITY) 2.84 XL/2.4MW SOPN, Inject into the skin once a week., Disp: , Rfl:  .  fenofibrate 160 MG tablet, , Disp: , Rfl:  .  Insulin Degludec-Liraglutide (XULTOPHY) 100-3.6 UNIT-MG/ML SOPN, Inject 25 Units into the skin., Disp: , Rfl:  .  Multiple Vitamins-Minerals (MULTIVITAMIN ADULT PO), Use as directed 1 tablet in the mouth or throat daily., Disp: , Rfl:  .  Vitamin D, Ergocalciferol, (DRISDOL) 50000 UNITS CAPS capsule, , Disp: , Rfl:    Allergies  Allergen Reactions  . Chlorpromazine Rash  . Trazodone And Nefazodone      Review of Systems  Constitutional: Negative.  Negative for fatigue.  HENT: Positive for rhinorrhea.   Respiratory: Positive for  cough.   Cardiovascular: Negative.   Endocrine: Negative for polydipsia, polyphagia and polyuria.  Genitourinary: Negative.   Musculoskeletal: Negative.   Neurological: Negative.  Negative for dizziness and headaches.     Today's Vitals   05/14/18 1003  BP: 132/84  Pulse: 97  Temp: 97.7 F (36.5 C)  TempSrc: Oral  SpO2: 96%  Weight: 257 lb 12.8 oz (116.9 kg)  Height: 5' 1.5" (1.562 m)  PainSc: 0-No pain   Body mass index is 47.92 kg/m.   Objective:  Physical Exam  Constitutional: She is oriented to  person, place, and time. She appears well-developed and well-nourished.  HENT:  Head: Normocephalic.  Eyes: Pupils are equal, round, and reactive to light. Conjunctivae and EOM are normal.  Neck: Normal range of motion. Neck supple.  Cardiovascular: Normal rate, regular rhythm and normal heart sounds.  Pulmonary/Chest: Effort normal and breath sounds normal. She has no wheezes. She exhibits no tenderness.  Neurological: She is alert and oriented to person, place, and time.  Skin: Skin is warm and dry.  Psychiatric: She has a normal mood and affect.        Assessment And Plan:    1. Type 2 diabetes mellitus without complication, without long-term current use of insulin (HCC)  Chronic, improving with last HgbA1c of 5.6  Doing well with Xultophy  Continue with current medications  Continue with your healthy eating and exercising  Congratulated her on progress and she is really excited about the changes she is making - Hemoglobin A1c  2. Essential hypertension  Chronic, controlled  Continue with current medications - BMP8+eGFR  3. Hyperlipidemia, unspecified hyperlipidemia type  Chronic, controlled  Continue with current medications .- Lipid Profile  4. Upper respiratory infection, viral  Afebrile,  No abnormal findings on physical exam.  Encouraged to take HBP [place] brand medications    [name], FNP

## 2018-05-29 DIAGNOSIS — G4733 Obstructive sleep apnea (adult) (pediatric): Secondary | ICD-10-CM | POA: Diagnosis not present

## 2018-06-01 DIAGNOSIS — F250 Schizoaffective disorder, bipolar type: Secondary | ICD-10-CM | POA: Diagnosis not present

## 2018-06-07 ENCOUNTER — Telehealth: Payer: Self-pay

## 2018-06-07 NOTE — Telephone Encounter (Signed)
Patient states her kidney doctor is no longer practicing and would like you to refer her to a new one. YRL,RMA

## 2018-06-07 NOTE — Telephone Encounter (Signed)
Spoke with pt and advised her that her sample is here and ready to be picked up and also notified her regarding her labs. YRL,RMA

## 2018-06-16 ENCOUNTER — Other Ambulatory Visit: Payer: Self-pay | Admitting: Nurse Practitioner

## 2018-06-25 ENCOUNTER — Encounter: Payer: Medicare HMO | Attending: Internal Medicine | Admitting: *Deleted

## 2018-06-25 DIAGNOSIS — Z713 Dietary counseling and surveillance: Secondary | ICD-10-CM | POA: Diagnosis not present

## 2018-06-25 DIAGNOSIS — E1122 Type 2 diabetes mellitus with diabetic chronic kidney disease: Secondary | ICD-10-CM | POA: Diagnosis not present

## 2018-06-25 DIAGNOSIS — L603 Nail dystrophy: Secondary | ICD-10-CM | POA: Diagnosis not present

## 2018-06-25 DIAGNOSIS — I739 Peripheral vascular disease, unspecified: Secondary | ICD-10-CM | POA: Diagnosis not present

## 2018-06-25 DIAGNOSIS — E119 Type 2 diabetes mellitus without complications: Secondary | ICD-10-CM

## 2018-06-25 DIAGNOSIS — L84 Corns and callosities: Secondary | ICD-10-CM | POA: Diagnosis not present

## 2018-06-25 DIAGNOSIS — E1151 Type 2 diabetes mellitus with diabetic peripheral angiopathy without gangrene: Secondary | ICD-10-CM | POA: Diagnosis not present

## 2018-06-25 NOTE — Patient Instructions (Signed)
Plan:   Aim for 2 Carb Choices per meal (30 grams) +/- 1 either way   Aim for 0-1 Carbs per snack if hungry   Enjoy your vegetables!  Include protein in moderation with your meals and snacks  Continue reducing your salt intake to help with water retention and blood pressure  Continue doing Arm Chair exercises for 15-30 minutes daily as well as Zumba on Tuesday and Thursday as tolerated   Continue with the band stretching exercises and walking daily too. Good job!  I'm so glad you are feeling better, more energetic.   Continue checking BG once a day in AM  Continue taking medication as prescribed

## 2018-06-25 NOTE — Progress Notes (Signed)
Diabetes Self-Management Education  Visit Type:  Follow-up  Appt. Start Time: 0810 Appt. End Time: 0840  06/25/2018  Kimberly Gross, identified by name and date of birth, is a 67 y.o. female with a diagnosis of Diabetes:  Patient continues to get healthy exercise with Zumba and Arm Chair Exercises regularly. She is pleased with her continued weight loss. She also is pleased with the number of vegetables she is eating in place of excessive starches and desserts.     ASSESSMENT  Height 5\' 4"  (1.626 m), weight 261 lb 3.2 oz (118.5 kg). Body mass index is 44.83 kg/m.   Diabetes Self-Management Education - 06/25/18 1254      Psychosocial Assessment   Patient Belief/Attitude about Diabetes  Motivated to manage diabetes    Self-care barriers  None    Self-management support  Family    Patient Concerns  Nutrition/Meal planning;Healthy Lifestyle;Glycemic Control    Learning Readiness  Change in progress      Complications   How often do you check your blood sugar?  1-2 times/day    Fasting Blood glucose range (mg/dL)  70-129      Exercise   Exercise Type  Light (walking / raking leaves)   Zumba and Arm Chair Exercises   How many days per week to you exercise?  4    How many minutes per day do you exercise?  30    Total minutes per week of exercise  120      Patient Self-Evaluation of Goals - Patient rates self as meeting previously set goals (% of time)   Nutrition  >75%    Physical Activity  >75%    Medications  >75%    Monitoring  >75%    Problem Solving  >75%    Reducing Risk  >75%      Outcomes   Program Status  Completed      Subsequent Visit   Since your last visit have you continued or begun to take your medications as prescribed?  Yes    Since your last visit have you experienced any weight changes?  Loss    Weight Loss (lbs)  3    Since your last visit, are you checking your blood glucose at least once a day?  Yes       Learning Objective:  Patient will have  a greater understanding of diabetes self-management. Patient education plan is to attend individual and/or group sessions per assessed needs and concerns.   Plan:   Patient Instructions  Plan:   Aim for 2 Carb Choices per meal (30 grams) +/- 1 either way   Aim for 0-1 Carbs per snack if hungry   Enjoy your vegetables!  Include protein in moderation with your meals and snacks  Continue reducing your salt intake to help with water retention and blood pressure  Continue doing Arm Chair exercises for 15-30 minutes daily as well as Zumba on Tuesday and Thursday as tolerated   Continue with the band stretching exercises and walking daily too. Good job!  I'm so glad you are feeling better, more energetic.   Continue checking BG once a day in AM  Continue taking medication as prescribed  Expected Outcomes:  Demonstrated interest in learning. Expect positive outcomes  Education material provided: [employer] Sheet  If problems or questions, patient to contact team via:  Phone  Future DSME appointment: - 3-4 months

## 2018-06-28 DIAGNOSIS — G4733 Obstructive sleep apnea (adult) (pediatric): Secondary | ICD-10-CM | POA: Diagnosis not present

## 2018-06-29 ENCOUNTER — Ambulatory Visit: Payer: Medicare Other | Admitting: Neurology

## 2018-06-29 ENCOUNTER — Telehealth: Payer: Self-pay | Admitting: Neurology

## 2018-06-29 NOTE — Telephone Encounter (Signed)
Pt was no show to apt today for yearly follow up.

## 2018-06-30 ENCOUNTER — Encounter: Payer: Self-pay | Admitting: Neurology

## 2018-07-15 ENCOUNTER — Other Ambulatory Visit: Payer: Self-pay | Admitting: Nurse Practitioner

## 2018-07-29 DIAGNOSIS — G4733 Obstructive sleep apnea (adult) (pediatric): Secondary | ICD-10-CM | POA: Diagnosis not present

## 2018-08-05 ENCOUNTER — Ambulatory Visit: Payer: Medicare Other

## 2018-08-05 ENCOUNTER — Telehealth: Payer: Self-pay

## 2018-08-05 ENCOUNTER — Ambulatory Visit: Payer: Medicare Other | Admitting: Internal Medicine

## 2018-08-05 NOTE — Telephone Encounter (Signed)
Attempted to follow up in regards to missed appointment.  Number gives an error message.

## 2018-08-16 ENCOUNTER — Ambulatory Visit: Payer: Medicare HMO | Admitting: Nurse Practitioner

## 2018-08-23 ENCOUNTER — Encounter: Payer: Self-pay | Admitting: Adult Health

## 2018-08-25 ENCOUNTER — Other Ambulatory Visit: Payer: Self-pay

## 2018-08-25 ENCOUNTER — Telehealth: Payer: Self-pay | Admitting: Adult Health

## 2018-08-25 ENCOUNTER — Ambulatory Visit: Payer: Medicare HMO | Admitting: Adult Health

## 2018-08-25 ENCOUNTER — Encounter: Payer: Self-pay | Admitting: Adult Health

## 2018-08-25 VITALS — BP 152/80 | HR 90 | Resp 20 | Ht 64.0 in | Wt 267.0 lb

## 2018-08-25 DIAGNOSIS — G4733 Obstructive sleep apnea (adult) (pediatric): Secondary | ICD-10-CM | POA: Diagnosis not present

## 2018-08-25 NOTE — Progress Notes (Signed)
SLEEP MEDICINE CLINIC   Provider:  Larey Seat, M D  Referring Provider: Glendale Chard, MD Primary Care Physician:  Glendale Chard, MD  Chief Complaint  Patient presents with  . Sleep Apnea    Rm. 9.  DME: [employer].   HPI:  JOMAIRA DARR is a 68 y.o. female , with history of OSA with BiPAP use and is being seen today for one-year follow-up visit.  Prior visit on 01/22/2017 with Dr. Brett Fairy with one appointment canceled due to weather and was a no-show to her rescheduled visit.  She is being seen today for BiPAP compliance and treatment of OSA.  BiPAP download from 07/25/2018 to 08/23/2018 shows overall usage days 30/3400% compliance with greater than 4 hours 22 days for 73% compliance.  Average usage 4 hours and 7 minutes with residual AHI 3.7.  Leaks in 95th percentile 34.6 L/min with IPAP 17 and EPAP 13  She endorses sleeping at night for 6-8 hours per night. She typically goes to bed around 8pm but will remove mask after 4 hrs of use.  When questioned reasoning behind this, she states she was told she only needs to wear it for 4 hours but denies bothersome symptoms or issues with tolerability avoid mask for longer. She will wake up throughout the night to go to the bathroom and once she knows she has worn for 4 hours, she will remove it.  She does endorse her hose around her mask is starting to tear and needs this replaced. She also needs filter replacement as the door to hold the filter in has become damaged.  She has lost weight with todays weight 267 with prior 273. She does endorse participating in [place] classes and doing exercises at home.  Had 9 teeth pulled since prior visit - she now has top denture plate. She plans on undergoing additional dental work.    HISTORY 01/22/2017 CD: I have the pleasure of seeing Mrs. Panek today in a follow-up visit regarding obstructive sleep apnea on CPAP. In my initial visit the patient had presented with an almost 68 year old sleep study but  had not let to any treatment. The patient returned for a scheduled CPAP titration after her a polysomnography to place on February 27 and revealed an AHI of 22 per hour in supine sleep of 26.5 apneas per hour in REM sleep 73.3 apneas per hour as well as prolonged hypoxemic periods.This patient could not be treated with a dental device and needed positive airway pressure. She returned on March 27 and was first attempted to be treated with CPAP. There was no reduction in her apnea count on CPAP and once the technologist change to the BiPAP modality at 17/13 cm water there was a significant reduction in apnea 20 and her oxygen nadir rose to 88% SPO2. Remarkable was her sleep efficiency she slept through 92% of the test time. She was prescribed BiPAP therapy and has been able to use BiPAP compliantly. Her compliance is 97% she uses the machine for the half hours each night, her inspiratory pressure is 17 cm expiratory pressure 13 cm water and her residual AHI is 2.2 per hour. She reports she sleeps very good in spite of some air leaks that she experiences. As these air leaks have not let to erroneous apnea count, I will ignore them. I like for her to use the interface but is most comfortable for her.       Review of Systems: Out of a complete 14 system review,  the patient complains of only the following symptoms, and all other reviewed systems are negative. See HPI      Social History   Socioeconomic History  . Marital status: Married    Spouse name: Not on file  . Number of children: 2  . Years of education: Not on file  . Highest education level: Not on file  Occupational History  . Not on file  Social Needs  . Financial resource strain: Not on file  . Food insecurity:    Worry: Not on file    Inability: Not on file  . Transportation needs:    Medical: Not on file    Non-medical: Not on file  Tobacco Use  . Smoking status: Never Smoker  . Smokeless tobacco: Never Used  Substance  and Sexual Activity  . Alcohol use: No  . Drug use: No  . Sexual activity: Not on file  Lifestyle  . Physical activity:    Days per week: Not on file    Minutes per session: Not on file  . Stress: Not on file  Relationships  . Social connections:    Talks on phone: Not on file    Gets together: Not on file    Attends religious service: Not on file    Active member of club or organization: Not on file    Attends meetings of clubs or organizations: Not on file    Relationship status: Not on file  . Intimate partner violence:    Fear of current or ex partner: Not on file    Emotionally abused: Not on file    Physically abused: Not on file    Forced sexual activity: Not on file  Other Topics Concern  . Not on file  Social History Narrative   Drinks 1-2 caffeine drinks a day     Family History  Problem Relation Age of Onset  . Hypertension Mother   . Heart attack Father     Past Medical History:  Diagnosis Date  . Anemia   . Anxiety   . Bipolar 1 disorder ([place])   . Depression   . Diabetes mellitus without complication ([place])   . Hyperlipemia   . Hypertension   . Occasional tremors     Past Surgical History:  Procedure Laterality Date  . VAGINAL HYSTERECTOMY      Current Outpatient Medications  Medication Sig Dispense Refill  . ACCU-CHEK SOFTCLIX LANCETS lancets     . Alcohol Swabs (B-D SINGLE USE SWABS REGULAR) PADS     . amLODipine (NORVASC) 10 MG tablet TAKE 1 TABLET EVERY DAY 90 tablet 0  . aspirin 81 MG tablet Take 81 mg by mouth once.    Marland Kitchen atorvastatin (LIPITOR) 40 MG tablet Take 40 mg by mouth at bedtime.  1  . Blood Glucose Monitoring Suppl (ACCU-CHEK AVIVA PLUS) W/DEVICE KIT     . clonazePAM (KLONOPIN) 0.5 MG tablet Take 1 tablet (0.5 mg total) by mouth 2 (two) times daily. 30 tablet 0  . divalproex (DEPAKOTE) 500 MG DR tablet     . fenofibrate 160 MG tablet TAKE 1 TABLET EVERY DAY 90 tablet 0  . Multiple Vitamins-Minerals (MULTIVITAMIN ADULT PO) Use as  directed 1 tablet in the mouth or throat daily.     No current facility-administered medications for this visit.     Allergies as of 08/25/2018 - Review Complete 08/25/2018  Allergen Reaction Noted  . Chlorpromazine Rash 05/08/2015  . Trazodone and nefazodone  01/03/2018  Vitals: BP (!) 152/80   Pulse 90   Resp 20   Ht '5\' 4"'$  (1.626 m)   Wt 267 lb (121.1 kg)   BMI 45.83 kg/m  Last Weight:  Wt Readings from Last 1 Encounters:  08/25/18 267 lb (121.1 kg)   JJK:KXFG mass index is 45.83 kg/m.     Last Height:   Ht Readings from Last 1 Encounters:  08/25/18 '5\' 4"'$  (1.626 m)    Physical exam:  General: The patient is awake, alert and appears not in acute distress. The patient is well groomed. Head: Normocephalic, atraumatic. Neck is supple. Mallampati 5 ,  neck circumference:18. Nasal airflow congested . Retrognathia is seen.   Cardiovascular:  Regular rate and rhythm, without  murmurs or carotid bruit, and without distended neck veins. Respiratory: Lungs are clear to auscultation. Skin:  Without evidence of edema, or rash Trunk: BMI is super obese. The patient's posture is stooped   Neurologic exam : The patient is awake and alert, oriented to place and time.  Attention span & concentration ability appears normal. Speech is fluent,  without dysarthria, dysphonia or aphasia. Mood and affect are appropriate. Cranial nerves: Pupils are equal and briskly reactive to light.  Visual fields by finger perimetry are intact. Hearing to finger rub intact. Facial sensation intact to fine touch. Facial motor strength is symmetric and tongue and uvula move midline. Shoulder shrug was symmetrical.  Motor exam:   Normal tone, muscle bulk and symmetric strength in all extremities within normal limits. Stance is stable and normal. Tandem gait is unfragmented. Turns with 4 Steps. Romberg testing is negative. Deep tendon reflexes: in the  upper and lower extremities are attenuated . Babinski  maneuver response is downgoing.  Assessment/plan:  Adrianna Dudas is a 68 year old female with a diagnosis of OSA on BiPAP management.  She continues to be followed in this office with Dr. Brett Fairy for ongoing management.  Her increased leaks are likely due to the tear in her house and advised her that order will be placed to her DME company to obtain a new house.  Encouraged her to wear mask throughout the entire period of her sleeping and not just 4 hours -she did agree to this plan.  Encouraged continuation with weight loss with continued exercise and healthy diet.  DME order will also request new filter with filter door.  She will follow-up in 1 years time with Jinny Blossom, NP or call earlier if needed DME company [employer]  Greater than 50% of this 25-minute visit was spent reviewing BiPAP download and ongoing compliance   Venancio Poisson, AGNP-BC  [facility] [address] [place], [place] [address]  Phone [phone] Fax [phone] Note: This document was prepared with digital dictation and possible smart phrase technology. Any transcriptional errors that result from this process are unintentional.

## 2018-08-25 NOTE — Patient Instructions (Signed)
Your Plan:  Continue current use of CPAP with great complaince - try use all night long while sleeping not just the 4 hours  You will be contacted by [employer] to obtain new filter and filter door along with hose  Follow up in 1 year with Jinny Blossom, NP or call earlier if needed     Thank you for coming to see [place] at [facility]. I hope we have been able to provide you high quality care today.  You may receive a patient satisfaction survey over the next few weeks. We would appreciate your feedback and comments so that we may continue to improve ourselves and the health of our patients.

## 2018-08-25 NOTE — Telephone Encounter (Signed)
Noted.  Added to med list/fim

## 2018-08-25 NOTE — Telephone Encounter (Signed)
Pt called back to give the name of her new diabetic inj. medication that she is on,m Xultophy 100 units pt states she only takes 30 units of the 100

## 2018-08-27 ENCOUNTER — Telehealth: Payer: Self-pay | Admitting: Nurse Practitioner

## 2018-08-27 NOTE — Telephone Encounter (Signed)
Called to schedule Medicare Annual Wellness Visit with the Nurse Health Advisor. No answer.  Left message on home answer machine to call Lattie Haw at [phone].  If patient returns call, please note: their last AWV was on 07/31/2017, please schedule AWV with [facility] any date AFTER 08/01/2018.  Thank you! For any questions please contact: Janace Hoard at [phone] or Skype [email or web address]

## 2018-08-27 NOTE — Telephone Encounter (Signed)
PT LVM THAT SHE WANTED TO SCHEDULE APPT NO ANS LVM FOR PT TO CALL OFC

## 2018-08-30 NOTE — Telephone Encounter (Signed)
Spoke with patient at her daughter, Janete Quilling phone number [phone]. DPR dated 08/02/2015 gives permission to speak with her Daughter, Dorena Cookey.  I spoke with the patient because she was at her daughter's residence.  I called patient to reschedule her Medicare Annual Wellness Visit and Office Visit at [facility].  AWV and OV are scheduled for 09/01/2018, OV at 2:45 PM and AWV-s at 3:30 PM. Janace Hoard, Care Guide.

## 2018-09-01 ENCOUNTER — Ambulatory Visit (INDEPENDENT_AMBULATORY_CARE_PROVIDER_SITE_OTHER): Payer: Medicare HMO

## 2018-09-01 ENCOUNTER — Encounter: Payer: Self-pay | Admitting: Nurse Practitioner

## 2018-09-01 ENCOUNTER — Ambulatory Visit (INDEPENDENT_AMBULATORY_CARE_PROVIDER_SITE_OTHER): Payer: Medicare HMO | Admitting: Nurse Practitioner

## 2018-09-01 VITALS — BP 136/80 | HR 88 | Temp 98.1°F | Ht 62.4 in | Wt 262.3 lb

## 2018-09-01 VITALS — BP 136/80 | HR 88 | Temp 98.1°F | Ht 62.4 in | Wt 262.2 lb

## 2018-09-01 DIAGNOSIS — E119 Type 2 diabetes mellitus without complications: Secondary | ICD-10-CM

## 2018-09-01 DIAGNOSIS — H6121 Impacted cerumen, right ear: Secondary | ICD-10-CM | POA: Diagnosis not present

## 2018-09-01 DIAGNOSIS — G4733 Obstructive sleep apnea (adult) (pediatric): Secondary | ICD-10-CM | POA: Diagnosis not present

## 2018-09-01 DIAGNOSIS — Z79899 Other long term (current) drug therapy: Secondary | ICD-10-CM

## 2018-09-01 DIAGNOSIS — E785 Hyperlipidemia, unspecified: Secondary | ICD-10-CM | POA: Diagnosis not present

## 2018-09-01 DIAGNOSIS — E782 Mixed hyperlipidemia: Secondary | ICD-10-CM | POA: Diagnosis not present

## 2018-09-01 DIAGNOSIS — Z0000 Encounter for general adult medical examination without abnormal findings: Secondary | ICD-10-CM

## 2018-09-01 DIAGNOSIS — I10 Essential (primary) hypertension: Secondary | ICD-10-CM | POA: Diagnosis not present

## 2018-09-01 DIAGNOSIS — R9431 Abnormal electrocardiogram [ECG] [EKG]: Secondary | ICD-10-CM | POA: Diagnosis not present

## 2018-09-01 DIAGNOSIS — Z0001 Encounter for general adult medical examination with abnormal findings: Secondary | ICD-10-CM | POA: Diagnosis not present

## 2018-09-01 LAB — POCT URINALYSIS DIPSTICK
Bilirubin, UA: NEGATIVE
Blood, UA: NEGATIVE
Glucose, UA: NEGATIVE
Ketones, UA: NEGATIVE
Leukocytes, UA: NEGATIVE
Nitrite, UA: NEGATIVE
PH UA: 5.5 (ref 5.0–8.0)
Protein, UA: POSITIVE — AB
Spec Grav, UA: 1.02 (ref 1.010–1.025)
Urobilinogen, UA: 0.2 E.U./dL

## 2018-09-01 LAB — POCT UA - MICROALBUMIN
Creatinine, POC: 300 mg/dL
Microalbumin Ur, POC: 150 mg/L

## 2018-09-01 MED ORDER — FUROSEMIDE 40 MG PO TABS: ORAL_TABLET | ORAL | 3 refills | Status: DC

## 2018-09-01 MED ORDER — ATORVASTATIN CALCIUM 40 MG PO TABS: 40.0000 mg | ORAL_TABLET | Freq: Every day | ORAL | 1 refills | Status: DC

## 2018-09-01 MED ORDER — INSULIN DEGLUDEC-LIRAGLUTIDE 100-3.6 UNIT-MG/ML ~~LOC~~ SOPN: 30.0000 [IU] | PEN_INJECTOR | Freq: Every day | SUBCUTANEOUS | 1 refills | Status: DC

## 2018-09-01 NOTE — Patient Instructions (Signed)

## 2018-09-01 NOTE — Progress Notes (Signed)
Subjective:   Kimberly Gross is a 68 y.o. female who presents for Medicare Annual (Subsequent) preventive examination.  Review of Systems:  n/a Cardiac Risk Factors include: advanced age (>26mn, >>49women);diabetes mellitus;hypertension;obesity (BMI >30kg/m2)     Objective:     Vitals: BP 136/80 (BP Location: Right Arm, Patient Position: Sitting)   Pulse 88   Temp 98.1 F (36.7 C) (Oral)   Ht 5' 2.4" (1.585 m)   Wt 262 lb 4.8 oz (119 kg)   BMI 47.36 kg/m   Body mass index is 47.36 kg/m.  Advanced Directives 09/01/2018 02/27/2016  Does Patient Have a Medical Advance Directive? No Yes  Does patient want to make changes to medical advance directive? - No - Patient declined  Would patient like information on creating a medical advance directive? No - Patient declined -    Tobacco Social History   Tobacco Use  Smoking Status Never Smoker  Smokeless Tobacco Never Used     Counseling given: Not Answered   Clinical Intake:  Pre-visit preparation completed: Yes  Pain : No/denies pain Pain Score: 0-No pain     Nutritional Status: BMI > 30  Obese Nutritional Risks: None Diabetes: Yes CBG done?: No Did pt. bring in CBG monitor from home?: No  How often do you need to have someone help you when you read instructions, pamphlets, or other written materials from your doctor or pharmacy?: 1 - Never What is the last grade level you completed in school?: some college  Interpreter Needed?: No  Information entered by :: NAllen LPN  Past Medical History:  Diagnosis Date  . Anemia   . Anxiety   . Bipolar 1 disorder ([place]   . Depression   . Diabetes mellitus without complication ([place]   . Hyperlipemia   . Hypertension   . Occasional tremors    Past Surgical History:  Procedure Laterality Date  . VAGINAL HYSTERECTOMY     Family History  Problem Relation Age of Onset  . Hypertension Mother   . Heart attack Father    Social History   Socioeconomic History  .  Marital status: Married    Spouse name: Not on file  . Number of children: 2  . Years of education: Not on file  . Highest education level: Not on file  Occupational History  . Occupation: retired  SScientific laboratory technician . Financial resource strain: Not hard at all  . Food insecurity:    Worry: Never true    Inability: Never true  . Transportation needs:    Medical: No    Non-medical: No  Tobacco Use  . Smoking status: Never Smoker  . Smokeless tobacco: Never Used  Substance and Sexual Activity  . Alcohol use: No  . Drug use: No  . Sexual activity: Not Currently  Lifestyle  . Physical activity:    Days per week: 7 days    Minutes per session: 20 min  . Stress: Not at all  Relationships  . Social connections:    Talks on phone: Not on file    Gets together: Not on file    Attends religious service: Not on file    Active member of club or organization: Not on file    Attends meetings of clubs or organizations: Not on file    Relationship status: Not on file  Other Topics Concern  . Not on file  Social History Narrative   Drinks 1-2 caffeine drinks a day     Outpatient  Encounter Medications as of 09/01/2018  Medication Sig  . ACCU-CHEK SOFTCLIX LANCETS lancets   . Alcohol Swabs (B-D SINGLE USE SWABS REGULAR) PADS   . amLODipine (NORVASC) 10 MG tablet TAKE 1 TABLET EVERY DAY  . aspirin 81 MG tablet Take 81 mg by mouth once.  Marland Kitchen atorvastatin (LIPITOR) 40 MG tablet Take 1 tablet (40 mg total) by mouth at bedtime.  . Blood Glucose Monitoring Suppl (ACCU-CHEK AVIVA PLUS) W/DEVICE KIT   . clonazePAM (KLONOPIN) 0.5 MG tablet Take 1 tablet (0.5 mg total) by mouth 2 (two) times daily.  . divalproex (DEPAKOTE) 500 MG DR tablet 500 mg. 2 pills 2 times per day  . fenofibrate 160 MG tablet TAKE 1 TABLET EVERY DAY  . furosemide (LASIX) 40 MG tablet Take 1 tab Monday, wed and Friday  . Insulin Degludec-Liraglutide (XULTOPHY) 100-3.6 UNIT-MG/ML SOPN Inject 30 Units into the skin daily.  .  Multiple Vitamins-Minerals (MULTIVITAMIN ADULT PO) Use as directed 1 tablet in the mouth or throat daily.   No facility-administered encounter medications on file as of 09/01/2018.     Activities of Daily Living In your present state of health, do you have any difficulty performing the following activities: 09/01/2018  Hearing? N  Vision? N  Difficulty concentrating or making decisions? Y  Comment some forgetfulness  Walking or climbing stairs? N  Dressing or bathing? N  Doing errands, shopping? Y  Comment does not drive some one takes her  Preparing Food and eating ? N  Using the Toilet? N  In the past six months, have you accidently leaked urine? Y  Comment wears depends  Do you have problems with loss of bowel control? N  Managing your Medications? Y  Comment daughter sets up med box  Managing your Finances? N  Housekeeping or managing your Housekeeping? N  Some recent data might be hidden    Patient Care Team: [name], FNP as PCP - General ([place]) Norma Fredrickson, MD as Consulting Physician (Psychiatry)    Assessment:   This is a routine wellness examination for Kimberly Gross.  Exercise Activities and Dietary recommendations Current Exercise Habits: The patient does not participate in regular exercise at present, Exercise limited by: None identified  Goals    . Weight (lb) < 200 lb (90.7 kg) (pt-stated)       Fall Risk Fall Risk  09/01/2018 06/25/2018 05/14/2018 10/23/2016 08/21/2016  Falls in the past year? 0 0 0 No No  Risk for fall due to : Medication side effect - - - -   Is the patient's home free of loose throw rugs in walkways, pet beds, electrical cords, etc?   yes      Grab bars in the bathroom? no      Handrails on the stairs?   n/a      Adequate lighting?   yes  Timed Get Up and Go performed: n/a  Depression Screen PHQ 2/9 Scores 09/01/2018 06/25/2018 05/14/2018 10/23/2016  PHQ - 2 Score 0 0 0 0  PHQ- 9 Score 0 - - -     Cognitive Function      6CIT Screen 09/01/2018  What Year? 0 points  What month? 0 points  What time? 0 points  Count back from 20 0 points  Months in reverse 0 points  Repeat phrase 2 points  Total Score 2    Immunization History  Administered Date(s) Administered  . Influenza-Unspecified 04/22/2018  . Tdap 01/18/2015    Qualifies for Shingles Vaccine? yes  Screening Tests Health Maintenance  Topic Date Due  . FOOT EXAM  [birth year]/04/1961  . OPHTHALMOLOGY EXAM  04/04/1961  . URINE MICROALBUMIN  04/04/1961  . PNA vac Low Risk Adult (1 of 2 - PCV13) 04/04/2016  . HEMOGLOBIN A1C  11/12/2018  . MAMMOGRAM  02/16/2020  . COLONOSCOPY  06/19/2020  . TETANUS/TDAP  01/17/2025  . INFLUENZA VACCINE  Completed  . DEXA SCAN  Completed  . Hepatitis C Screening  Completed    Cancer Screenings: Lung: Low Dose CT Chest recommended if Age 14-80 years, 30 pack-year currently smoking OR have quit w/in 15years. Patient does not qualify. Breast:  Up to date on Mammogram? Yes   Up to date of Bone Density/Dexa? Yes Colorectal: up to date  Additional Screenings: : Hepatitis C Screening: 01/18/2018     Plan:    Patient wants to lose weight.    I have personally reviewed and noted the following in the patient's chart:   . Medical and social history . Use of alcohol, tobacco or illicit drugs  . Current medications and supplements . Functional ability and status . Nutritional status . Physical activity . Advanced directives . List of other physicians . Hospitalizations, surgeries, and ER visits in previous 12 months . Vitals . Screenings to include cognitive, depression, and falls . Referrals and appointments  In addition, I have reviewed and discussed with patient certain preventive protocols, quality metrics, and best practice recommendations. A written personalized care plan for preventive services as well as general preventive health recommendations were provided to patient.     Kellie Simmering,  LPN  [id number]/00/7622

## 2018-09-01 NOTE — Patient Instructions (Addendum)
Kimberly Gross , Thank you for taking time to come for your Medicare Wellness Visit. I appreciate your ongoing commitment to your health goals. Please review the following plan we discussed and let me know if I can assist you in the future.   Screening recommendations/referrals: Colonoscopy: 06/2010 Mammogram: 02/2018 Bone Density: 02/2018 Recommended yearly ophthalmology/optometry visit for glaucoma screening and checkup Recommended yearly dental visit for hygiene and checkup  Vaccinations: Influenza vaccine: due Pneumococcal vaccine: due Tdap vaccine: 01/18/2015 Shingles vaccine: discussed    Advanced directives: Advance directive discussed with you today. Even though you declined this today please call our office should you change your mind and we can give you the proper paperwork for you to fill out.   Conditions/risks identified: Obesity  Next appointment: 12/01/2018 at 2:30p   Preventive Care 65 Years and Older, Female Preventive care refers to lifestyle choices and visits with your health care provider that can promote health and wellness. What does preventive care include?  A yearly physical exam. This is also called an annual well check.  Dental exams once or twice a year.  Routine eye exams. Ask your health care provider how often you should have your eyes checked.  Personal lifestyle choices, including:  Daily care of your teeth and gums.  Regular physical activity.  Eating a healthy diet.  Avoiding tobacco and drug use.  Limiting alcohol use.  Practicing safe sex.  Taking low-dose aspirin every day.  Taking vitamin and mineral supplements as recommended by your health care provider. What happens during an annual well check? The services and screenings done by your health care provider during your annual well check will depend on your age, overall health, lifestyle risk factors, and family history of disease. Counseling  Your health care provider may ask you  questions about your:  Alcohol use.  Tobacco use.  Drug use.  Emotional well-being.  Home and relationship well-being.  Sexual activity.  Eating habits.  History of falls.  Memory and ability to understand (cognition).  Work and work Statistician.  Reproductive health. Screening  You may have the following tests or measurements:  Height, weight, and BMI.  Blood pressure.  Lipid and cholesterol levels. These may be checked every 5 years, or more frequently if you are over 19 years old.  Skin check.  Lung cancer screening. You may have this screening every year starting at age 51 if you have a 30-pack-year history of smoking and currently smoke or have quit within the past 15 years.  Fecal occult blood test (FOBT) of the stool. You may have this test every year starting at age 76.  Flexible sigmoidoscopy or colonoscopy. You may have a sigmoidoscopy every 5 years or a colonoscopy every 10 years starting at age 110.  Hepatitis C blood test.  Hepatitis B blood test.  Sexually transmitted disease (STD) testing.  Diabetes screening. This is done by checking your blood sugar (glucose) after you have not eaten for a while (fasting). You may have this done every 1-3 years.  Bone density scan. This is done to screen for osteoporosis. You may have this done starting at age 21.  Mammogram. This may be done every 1-2 years. Talk to your health care provider about how often you should have regular mammograms. Talk with your health care provider about your test results, treatment options, and if necessary, the need for more tests. Vaccines  Your health care provider may recommend certain vaccines, such as:  Influenza vaccine. This is recommended every  year.  Tetanus, diphtheria, and acellular pertussis (Tdap, Td) vaccine. You may need a Td booster every 10 years.  Zoster vaccine. You may need this after age 42.  Pneumococcal 13-valent conjugate (PCV13) vaccine. One dose is  recommended after age 19.  Pneumococcal polysaccharide (PPSV23) vaccine. One dose is recommended after age 72. Talk to your health care provider about which screenings and vaccines you need and how often you need them. This information is not intended to replace advice given to you by your health care provider. Make sure you discuss any questions you have with your health care provider. Document Released: 07/27/2015 Document Revised: 03/19/2016 Document Reviewed: 05/01/2015 Elsevier Interactive Patient Education  2017 [place] Prevention in the Home Falls can cause injuries. They can happen to people of all ages. There are many things you can do to make your home safe and to help prevent falls. What can I do on the outside of my home?  Regularly fix the edges of walkways and driveways and fix any cracks.  Remove anything that might make you trip as you walk through a door, such as a raised step or threshold.  Trim any bushes or trees on the path to your home.  Use bright outdoor lighting.  Clear any walking paths of anything that might make someone trip, such as rocks or tools.  Regularly check to see if handrails are loose or broken. Make sure that both sides of any steps have handrails.  Any raised decks and porches should have guardrails on the edges.  Have any leaves, snow, or ice cleared regularly.  Use sand or salt on walking paths during winter.  Clean up any spills in your garage right away. This includes oil or grease spills. What can I do in the bathroom?  Use night lights.  Install grab bars by the toilet and in the tub and shower. Do not use towel bars as grab bars.  Use non-skid mats or decals in the tub or shower.  If you need to sit down in the shower, use a plastic, non-slip stool.  Keep the floor dry. Clean up any water that spills on the floor as soon as it happens.  Remove soap buildup in the tub or shower regularly.  Attach bath mats  securely with double-sided non-slip rug tape.  Do not have throw rugs and other things on the floor that can make you trip. What can I do in the bedroom?  Use night lights.  Make sure that you have a light by your bed that is easy to reach.  Do not use any sheets or blankets that are too big for your bed. They should not hang down onto the floor.  Have a firm chair that has side arms. You can use this for support while you get dressed.  Do not have throw rugs and other things on the floor that can make you trip. What can I do in the kitchen?  Clean up any spills right away.  Avoid walking on wet floors.  Keep items that you use a lot in easy-to-reach places.  If you need to reach something above you, use a strong step stool that has a grab bar.  Keep electrical cords out of the way.  Do not use floor polish or wax that makes floors slippery. If you must use wax, use non-skid floor wax.  Do not have throw rugs and other things on the floor that can make you trip. What can  I do with my stairs?  Do not leave any items on the stairs.  Make sure that there are handrails on both sides of the stairs and use them. Fix handrails that are broken or loose. Make sure that handrails are as long as the stairways.  Check any carpeting to make sure that it is firmly attached to the stairs. Fix any carpet that is loose or worn.  Avoid having throw rugs at the top or bottom of the stairs. If you do have throw rugs, attach them to the floor with carpet tape.  Make sure that you have a light switch at the top of the stairs and the bottom of the stairs. If you do not have them, ask someone to add them for you. What else can I do to help prevent falls?  Wear shoes that:  Do not have high heels.  Have rubber bottoms.  Are comfortable and fit you well.  Are closed at the toe. Do not wear sandals.  If you use a stepladder:  Make sure that it is fully opened. Do not climb a closed  stepladder.  Make sure that both sides of the stepladder are locked into place.  Ask someone to hold it for you, if possible.  Clearly mark and make sure that you can see:  Any grab bars or handrails.  First and last steps.  Where the edge of each step is.  Use tools that help you move around (mobility aids) if they are needed. These include:  Canes.  Walkers.  Scooters.  Crutches.  Turn on the lights when you go into a dark area. Replace any light bulbs as soon as they burn out.  Set up your furniture so you have a clear path. Avoid moving your furniture around.  If any of your floors are uneven, fix them.  If there are any pets around you, be aware of where they are.  Review your medicines with your doctor. Some medicines can make you feel dizzy. This can increase your chance of falling. Ask your doctor what other things that you can do to help prevent falls. This information is not intended to replace advice given to you by your health care provider. Make sure you discuss any questions you have with your health care provider. Document Released: 04/26/2009 Document Revised: 12/06/2015 Document Reviewed: 08/04/2014 Elsevier Interactive Patient Education  2017 [employer].

## 2018-09-02 LAB — CMP14 + ANION GAP
ALT: 27 IU/L (ref 0–32)
AST: 24 IU/L (ref 0–40)
Albumin/Globulin Ratio: 1.8 (ref 1.2–2.2)
Albumin: 4.5 g/dL (ref 3.8–4.8)
Alkaline Phosphatase: 69 IU/L (ref 39–117)
Anion Gap: 18 mmol/L (ref 10.0–18.0)
BUN/Creatinine Ratio: 18 (ref 12–28)
BUN: 42 mg/dL — ABNORMAL HIGH (ref 8–27)
Bilirubin Total: <0.2 mg/dL (ref 0.0–1.2)
CO2: 24 mmol/L (ref 20–29)
Calcium: 10.3 mg/dL (ref 8.7–10.3)
Chloride: 100 mmol/L (ref 96–106)
Creatinine, Ser: 2.29 mg/dL — ABNORMAL HIGH (ref 0.57–1.00)
GFR calc Af Amer: 25 mL/min/{1.73_m2} — ABNORMAL LOW (ref >59)
GFR calc non Af Amer: 21 mL/min/{1.73_m2} — ABNORMAL LOW (ref >59)
Globulin, Total: 2.5 g/dL (ref 1.5–4.5)
Glucose: 175 mg/dL — ABNORMAL HIGH (ref 65–99)
Potassium: 4.6 mmol/L (ref 3.5–5.2)
SODIUM: 142 mmol/L (ref 134–144)
Total Protein: 7 g/dL (ref 6.0–8.5)

## 2018-09-02 LAB — LIPID PANEL
CHOL/HDL RATIO: 3.7 ratio (ref 0.0–4.4)
Cholesterol, Total: 183 mg/dL (ref 100–199)
HDL: 49 mg/dL (ref >39)
LDL Calculated: 81 mg/dL (ref 0–99)
Triglycerides: 266 mg/dL — ABNORMAL HIGH (ref 0–149)
VLDL Cholesterol Cal: 53 mg/dL — ABNORMAL HIGH (ref 5–40)

## 2018-09-02 LAB — HEMOGLOBIN A1C
ESTIMATED AVERAGE GLUCOSE: 148 mg/dL
Hgb A1c MFr Bld: 6.8 % — ABNORMAL HIGH (ref 4.8–5.6)

## 2018-09-02 LAB — TSH: TSH: 1.61 u[IU]/mL (ref 0.450–4.500)

## 2018-09-06 DIAGNOSIS — N184 Chronic kidney disease, stage 4 (severe): Secondary | ICD-10-CM | POA: Diagnosis not present

## 2018-09-06 DIAGNOSIS — I129 Hypertensive chronic kidney disease with stage 1 through stage 4 chronic kidney disease, or unspecified chronic kidney disease: Secondary | ICD-10-CM | POA: Diagnosis not present

## 2018-09-06 DIAGNOSIS — E1122 Type 2 diabetes mellitus with diabetic chronic kidney disease: Secondary | ICD-10-CM | POA: Diagnosis not present

## 2018-09-06 DIAGNOSIS — N261 Atrophy of kidney (terminal): Secondary | ICD-10-CM | POA: Diagnosis not present

## 2018-09-06 DIAGNOSIS — G4730 Sleep apnea, unspecified: Secondary | ICD-10-CM | POA: Diagnosis not present

## 2018-09-06 DIAGNOSIS — Z6841 Body Mass Index (BMI) 40.0 and over, adult: Secondary | ICD-10-CM | POA: Diagnosis not present

## 2018-09-07 ENCOUNTER — Encounter: Payer: Self-pay | Admitting: Nurse Practitioner

## 2018-09-07 NOTE — Progress Notes (Deleted)
Subjective:     Patient ID: Kimberly Gross , female    DOB: 12-Nov-1950 , 68 y.o.   MRN: [id number]   Chief Complaint  Patient presents with  . Diabetes    HPI  HPI   Past Medical History:  Diagnosis Date  . Anemia   . Anxiety   . Bipolar 1 disorder ([place])   . Depression   . Diabetes mellitus without complication ([place])   . Hyperlipemia   . Hypertension   . Occasional tremors      Family History  Problem Relation Age of Onset  . Hypertension Mother   . Heart attack Father      Current Outpatient Medications:  .  ACCU-CHEK SOFTCLIX LANCETS lancets, , Disp: , Rfl:  .  Alcohol Swabs (B-D SINGLE USE SWABS REGULAR) PADS, , Disp: , Rfl:  .  amLODipine (NORVASC) 10 MG tablet, TAKE 1 TABLET EVERY DAY, Disp: 90 tablet, Rfl: 0 .  aspirin 81 MG tablet, Take 81 mg by mouth once., Disp: , Rfl:  .  atorvastatin (LIPITOR) 40 MG tablet, Take 1 tablet (40 mg total) by mouth at bedtime., Disp: 90 tablet, Rfl: 1 .  Blood Glucose Monitoring Suppl (ACCU-CHEK AVIVA PLUS) W/DEVICE KIT, , Disp: , Rfl:  .  clonazePAM (KLONOPIN) 0.5 MG tablet, Take 1 tablet (0.5 mg total) by mouth 2 (two) times daily., Disp: 30 tablet, Rfl: 0 .  divalproex (DEPAKOTE) 500 MG DR tablet, 500 mg. 2 pills 2 times per day, Disp: , Rfl:  .  fenofibrate 160 MG tablet, TAKE 1 TABLET EVERY DAY, Disp: 90 tablet, Rfl: 0 .  Insulin Degludec-Liraglutide (XULTOPHY) 100-3.6 UNIT-MG/ML SOPN, Inject 30 Units into the skin daily., Disp: 15 pen, Rfl: 1 .  Multiple Vitamins-Minerals (MULTIVITAMIN ADULT PO), Use as directed 1 tablet in the mouth or throat daily., Disp: , Rfl:  .  furosemide (LASIX) 40 MG tablet, Take 1 tab Monday, wed and Friday, Disp: 30 tablet, Rfl: 3   Allergies  Allergen Reactions  . Chlorpromazine Rash  . Trazodone And Nefazodone      Review of Systems   Today's Vitals   09/01/18 1453  BP: 136/80  Pulse: 88  Temp: 98.1 F (36.7 C)  TempSrc: Oral  Weight: 262 lb 3.2 oz (118.9 kg)  Height: 5' 2.4"  (1.585 m)   Body mass index is 47.34 kg/m.   Objective:  Physical Exam      Assessment And Plan:     1. Health maintenance examination  Pt's annual wellness exam was performed and geriatric assessment reviewed.   Pt has no new identiafble wellness concerns at this time.   WIll obtain routine labs.   Will obtain UA and micro.   Behavior modifications discussed and diet history reviewed. Pt will continue to exercise regularly and modify diet, with low GI, plant based foods and decrease food intake of processed foods.   Recommend intake of daily multivitamin, Vitamin D, and calcium.  Recommond mammogram and colonoscopy for preventive screenings, as well as recommend immunizations that include influenza (up to date) and TDAP  2. Type 2 diabetes mellitus without complication, without long-term current use of insulin (HCC)  Chronic, better controlled  Continue with current medications  Encouraged to limit intake of sugary foods and drinks  Encouraged to increase physical activity to 150 minutes per week - Lipid panel - CMP14 + Anion Gap - Hemoglobin A1c - Ambulatory referral to Cardiology  3. Essential hypertension . B/P is fair control  .  CMP ordered to check renal function.  . The importance of regular exercise and dietary modification was stressed to the patient.  . Stressed importance of losing ten percent of her body weight to help with B/P control.  . The weight loss would help with decreasing cardiac  - furosemide (LASIX) 40 MG tablet; Take 1 tab Monday, wed and Friday  Dispense: 30 tablet; Refill: 3 - EKG 12-Lead - Ambulatory referral to Cardiology  4. Hyperlipidemia, unspecified hyperlipidemia type Chronic, controlled Continue with current medications  5. OSA treated with BiPAP  Continue wearing your Bipap  Continue with follow up with Dr. Brett Fairy  6. Impacted cerumen of right ear  Bilateral ears irrigated   7. Other long term (current) drug  therapy  - TSH  8. Mixed hyperlipidemia  Chronic, controlled  Continue with current medications - Lipid panel  9. Abnormal EKG  Incomplete right bundle branch block and left axis anterior fascicular block - Ambulatory referral to Cardiology    [name], FNP

## 2018-09-09 ENCOUNTER — Telehealth: Payer: Self-pay

## 2018-09-09 NOTE — Telephone Encounter (Signed)
Pt called and advised that she has laryngitis , sore throat, and pain in her side. She would like to have something called in .Please advise [facility]

## 2018-09-09 NOTE — Telephone Encounter (Signed)
Please call to make patient aware will need an office visit as this sounds like a new problem since her visit last week.

## 2018-09-10 ENCOUNTER — Ambulatory Visit (INDEPENDENT_AMBULATORY_CARE_PROVIDER_SITE_OTHER): Payer: Medicare HMO | Admitting: Nurse Practitioner

## 2018-09-10 ENCOUNTER — Encounter: Payer: Self-pay | Admitting: Nurse Practitioner

## 2018-09-10 VITALS — BP 134/82 | HR 96 | Temp 97.9°F | Ht 62.4 in | Wt 261.4 lb

## 2018-09-10 DIAGNOSIS — J029 Acute pharyngitis, unspecified: Secondary | ICD-10-CM | POA: Diagnosis not present

## 2018-09-10 DIAGNOSIS — R05 Cough: Secondary | ICD-10-CM | POA: Insufficient documentation

## 2018-09-10 DIAGNOSIS — R059 Cough, unspecified: Secondary | ICD-10-CM

## 2018-09-10 MED ORDER — HYDROCODONE-HOMATROPINE 5-1.5 MG/5ML PO SYRP: 5.0000 mL | ORAL_SOLUTION | Freq: Four times a day (QID) | ORAL | 0 refills | Status: DC | PRN

## 2018-09-10 MED ORDER — TRIAMCINOLONE ACETONIDE 40 MG/ML IJ SUSP
40.0000 mg | Freq: Once | INTRAMUSCULAR | Status: AC
  Administered 2018-09-10: 40 mg via INTRAMUSCULAR

## 2018-09-10 NOTE — Telephone Encounter (Signed)
Pt has appt today. KH °

## 2018-09-10 NOTE — Progress Notes (Signed)
Subjective:     Patient ID: Kimberly Gross , female    DOB: 1950/07/27 , 68 y.o.   MRN: [id number]   Chief Complaint  Patient presents with  . other    cold symptoms ,loss voice, coughing right side pain and chest pain from the cough    HPI  Cough  This is a new problem. The current episode started in the past 7 days. The problem has been gradually worsening. The problem occurs constantly. The cough is non-productive. Associated symptoms include a sore throat. Pertinent negatives include no chest pain, fever, headaches, nasal congestion or shortness of breath. Treatments tried: HBP [place]. There is no history of asthma.     Past Medical History:  Diagnosis Date  . Anemia   . Anxiety   . Bipolar 1 disorder ([place])   . Depression   . Diabetes mellitus without complication ([place])   . Hyperlipemia   . Hypertension   . Occasional tremors      Family History  Problem Relation Age of Onset  . Hypertension Mother   . Heart attack Father      Current Outpatient Medications:  .  ACCU-CHEK SOFTCLIX LANCETS lancets, , Disp: , Rfl:  .  Alcohol Swabs (B-D SINGLE USE SWABS REGULAR) PADS, , Disp: , Rfl:  .  amLODipine (NORVASC) 10 MG tablet, TAKE 1 TABLET EVERY DAY, Disp: 90 tablet, Rfl: 0 .  aspirin 81 MG tablet, Take 81 mg by mouth once., Disp: , Rfl:  .  atorvastatin (LIPITOR) 40 MG tablet, Take 1 tablet (40 mg total) by mouth at bedtime., Disp: 90 tablet, Rfl: 1 .  Blood Glucose Monitoring Suppl (ACCU-CHEK AVIVA PLUS) W/DEVICE KIT, , Disp: , Rfl:  .  clonazePAM (KLONOPIN) 0.5 MG tablet, Take 1 tablet (0.5 mg total) by mouth 2 (two) times daily., Disp: 30 tablet, Rfl: 0 .  divalproex (DEPAKOTE) 500 MG DR tablet, 500 mg. 2 pills 2 times per day, Disp: , Rfl:  .  fenofibrate 160 MG tablet, TAKE 1 TABLET EVERY DAY, Disp: 90 tablet, Rfl: 0 .  furosemide (LASIX) 40 MG tablet, Take 1 tab Monday, wed and Friday, Disp: 30 tablet, Rfl: 3 .  Insulin Degludec-Liraglutide (XULTOPHY) 100-3.6  UNIT-MG/ML SOPN, Inject 30 Units into the skin daily., Disp: 15 pen, Rfl: 1 .  Multiple Vitamins-Minerals (MULTIVITAMIN ADULT PO), Use as directed 1 tablet in the mouth or throat daily., Disp: , Rfl:    Allergies  Allergen Reactions  . Chlorpromazine Rash  . Trazodone And Nefazodone      Review of Systems  Constitutional: Negative for fatigue and fever.  HENT: Positive for congestion and sore throat.   Eyes: Negative.   Respiratory: Positive for cough. Negative for chest tightness and shortness of breath.   Cardiovascular: Negative for chest pain, palpitations and leg swelling.  Neurological: Negative for dizziness and headaches.     Today's Vitals   09/10/18 1051  BP: 134/82  Pulse: 96  Temp: 97.9 F (36.6 C)  SpO2: 98%  Weight: 261 lb 6.4 oz (118.6 kg)  Height: 5' 2.4" (1.585 m)   Body mass index is 47.2 kg/m.   Objective:  Physical Exam Vitals signs reviewed.  Constitutional:      Appearance: Normal appearance.  Cardiovascular:     Rate and Rhythm: Normal rate and regular rhythm.     Pulses: Normal pulses.     Heart sounds: Normal heart sounds. No murmur.  Pulmonary:     Effort: Pulmonary effort is normal.  No respiratory distress.     Breath sounds: Normal breath sounds. No wheezing.  Skin:    General: Skin is warm and dry.  Neurological:     Mental Status: She is alert.          Assessment And Plan:     1. Cough  Dry hacking cough  Tenderness to left lateral thoracic area on palpation  May be an early bronchitis if not better return call to office - HYDROcodone-homatropine (HYDROMET) 5-1.5 MG/5ML syrup; Take 5 mLs by mouth every 6 (six) hours as needed.  Dispense: 120 mL; Refill: 0 - triamcinolone acetonide (KENALOG-40) injection 40 mg    [name], FNP

## 2018-09-13 ENCOUNTER — Other Ambulatory Visit: Payer: Self-pay

## 2018-09-13 MED ORDER — ACCU-CHEK SOFTCLIX LANCETS MISC: 1 refills | Status: DC

## 2018-09-14 ENCOUNTER — Encounter: Payer: Self-pay | Admitting: Nurse Practitioner

## 2018-09-16 NOTE — Progress Notes (Addendum)
Note Revision History    Edited by [name], FNP, 09/01/2018 3:17 PM     [name], [place]  Family Nurse Practitioner  General Practice  Progress Notes    Roxan Diesel)  Encounter Date:  09/01/2018                Show:Clear all [x]Manual[x]Template[]Copied  Added by: [x], Doreene Burke, FNP  []Hover for details  Subjective:     Patient ID: Kimberly Gross , female    DOB: 1950/10/16 , 68 y.o.   MRN: [id number]      Chief Complaint  Patient presents with  . Diabetes   The patient states she uses post menopausal status for birth control. Last LMP was No LMP recorded. Patient has had a hysterectomy.. Negative for Dysmenorrhea and Negative for Menorrhagia Mammogram last done 02/15/2018.  Negative for: breast discharge, breast lump(s), breast pain and breast self exam.  Pertinent negatives include abnormal bleeding (hematology), anxiety, decreased libido, depression, difficulty falling sleep, dyspareunia, history of infertility, nocturia, sexual dysfunction, sleep disturbances, urinary incontinence, urinary urgency, vaginal discharge and vaginal itching. Diet regular.The patient states her exercise level is        The patient's tobacco use is:  Social History      Tobacco Use  Smoking Status Never Smoker  Smokeless Tobacco Never Used   She has been exposed to passive smoke. The patient's alcohol use is:  Social History      Substance and Sexual Activity  Alcohol Use No   Additional information: Last pap , next one scheduled for   HPI  Diabetes  She presents for her follow-up diabetic visit. She has type 2 diabetes mellitus. Her disease course has been stable. Pertinent negatives for diabetes include no blurred vision, no chest pain and no fatigue. Current diabetic treatment includes oral agent (dual therapy). She is compliant with treatment all of the time. (120-169)  Hypertension  Pertinent negatives include no blurred vision, chest pain or  palpitations.         Past Medical History:  Diagnosis Date  . Anemia   . Anxiety   . Bipolar 1 disorder ([place])   . Depression   . Diabetes mellitus without complication ([place])   . Hyperlipemia   . Hypertension   . Occasional tremors           Family History  Problem Relation Age of Onset  . Hypertension Mother   . Heart attack Father      Current Outpatient Medications:  .  ACCU-CHEK SOFTCLIX LANCETS lancets, , Disp: , Rfl:  .  Alcohol Swabs (B-D SINGLE USE SWABS REGULAR) PADS, , Disp: , Rfl:  .  amLODipine (NORVASC) 10 MG tablet, TAKE 1 TABLET EVERY DAY, Disp: 90 tablet, Rfl: 0 .  aspirin 81 MG tablet, Take 81 mg by mouth once., Disp: , Rfl:  .  atorvastatin (LIPITOR) 40 MG tablet, Take 40 mg by mouth at bedtime., Disp: , Rfl: 1 .  Blood Glucose Monitoring Suppl (ACCU-CHEK AVIVA PLUS) W/DEVICE KIT, , Disp: , Rfl:  .  clonazePAM (KLONOPIN) 0.5 MG tablet, Take 1 tablet (0.5 mg total) by mouth 2 (two) times daily., Disp: 30 tablet, Rfl: 0 .  divalproex (DEPAKOTE) 500 MG DR tablet, 500 mg. 2 pills 2 times per day, Disp: , Rfl:  .  fenofibrate 160 MG tablet, TAKE 1 TABLET EVERY DAY, Disp: 90 tablet, Rfl: 0 .  Insulin Degludec-Liraglutide (XULTOPHY) 100-3.6 UNIT-MG/ML SOPN, Inject 30 Units into the skin. , Disp: , Rfl:  .  Multiple Vitamins-Minerals (MULTIVITAMIN ADULT PO), Use as directed 1 tablet in the mouth or throat daily., Disp: , Rfl:        Allergies  Allergen Reactions  . Chlorpromazine Rash  . Trazodone And Nefazodone      Review of Systems  Constitutional: Negative.  Negative for fatigue.  HENT: Negative.   Eyes: Negative.  Negative for blurred vision.  Respiratory: Negative.   Cardiovascular: Negative.  Negative for chest pain, palpitations and leg swelling.  Gastrointestinal: Negative.   Endocrine: Negative.   Genitourinary: Negative.   Musculoskeletal: Negative.   Skin: Negative.   Allergic/Immunologic: Negative.   Neurological:  Negative.   Hematological: Negative.   Psychiatric/Behavioral: Negative.         Today's Vitals   09/01/18 1453  BP: 136/80  Pulse: 88  Temp: 98.1 F (36.7 C)  TempSrc: Oral  Weight: 262 lb 3.2 oz (118.9 kg)  Height: 5' 2.4" (1.585 m)   Body mass index is 47.34 kg/m.   Objective:  Physical Exam Vitals signs reviewed.  Constitutional:      General: She is not in acute distress.    Appearance: Normal appearance. She is well-developed. She is obese.  HENT:     Head: Normocephalic and atraumatic.     Right Ear: Hearing, tympanic membrane, impacted with cerumen     Left Ear: Hearing, tympanic membrane, ear canal and external ear normal.     Nose: Nose normal.     Mouth/Throat:     Mouth: Mucous membranes are moist.  Eyes:     General: Lids are normal.     Extraocular Movements: Extraocular movements intact.     Conjunctiva/sclera: Conjunctivae normal.     Pupils: Pupils are equal, round, and reactive to light.     Funduscopic exam:    Right eye: No papilledema.        Left eye: No papilledema.  Neck:     Musculoskeletal: Full passive range of motion without pain, normal range of motion and neck supple.     Thyroid: No thyroid mass.     Vascular: No carotid bruit.  Cardiovascular:     Rate and Rhythm: Normal rate and regular rhythm.     Pulses: Normal pulses.     Heart sounds: Normal heart sounds. No murmur.  Pulmonary:     Effort: Pulmonary effort is normal.     Breath sounds: Normal breath sounds.  Abdominal:     General: Abdomen is flat. Bowel sounds are normal.     Palpations: Abdomen is soft.  Musculoskeletal: Normal range of motion.        General: No swelling.     Right lower leg: No edema.     Left lower leg: No edema.  Skin:    General: Skin is warm and dry.     Capillary Refill: Capillary refill takes less than 2 seconds.  Neurological:     General: No focal deficit present.     Mental Status: She is alert and oriented to person, place, and time.      Cranial Nerves: No cranial nerve deficit.     Sensory: No sensory deficit.  Psychiatric:        Mood and Affect: Mood normal.        Behavior: Behavior normal.        Thought Content: Thought content normal.        Judgment: Judgment normal.         Assessment And Plan:   1.  Health maintenance examination  Pt's annual wellness exam was performed and geriatric assessment reviewed.   Pt has no new identiafble wellness concerns at this time.   WIll obtain routine labs.   Will obtain UA and micro.   Behavior modifications discussed and diet history reviewed. Pt will continue to exercise regularly and modify diet, with low GI, plant based foods and decrease food intake of processed foods.   Recommend intake of daily multivitamin, Vitamin D, and calcium.  Recommond mammogram and colonoscopy for preventive screenings, as well as recommend immunizations that include influenza (up to date) and TDAP  2. Type 2 diabetes mellitus without complication, without long-term current use of insulin (HCC)  Chronic, better controlled  Continue with current medications  Encouraged to limit intake of sugary foods and drinks  Encouraged to increase physical activity to 150 minutes per week - Lipid panel - CMP14 + Anion Gap - Hemoglobin A1c - Ambulatory referral to Cardiology  3. Essential hypertension  B/P is fair control   CMP ordered to check renal function.   The importance of regular exercise and dietary modification was stressed to the patient.   Stressed importance of losing ten percent of her body weight to help with B/P control.   The weight loss would help with decreasing cardiac  - furosemide (LASIX) 40 MG tablet; Take 1 tab Monday, wed and Friday  Dispense: 30 tablet; Refill: 3 - EKG 12-Lead - Ambulatory referral to Cardiology  4. Hyperlipidemia, unspecified hyperlipidemia type  Chronic, controlled  Continue with current medications  5. OSA treated with  BiPAP  Continue wearing your Bipap  Continue with follow up with Dr. Brett Fairy  6. Impacted cerumen of right ear  Right ear irrigated with good success  7. Other long term (current) drug therapy  - TSH  8. Mixed hyperlipidemia  Chronic, controlled  Continue with current medications - Lipid panel  9. Abnormal EKG  Incomplete right bundle branch block and left axis anterior fascicular block - Ambulatory referral to Cardiology         [name], FNP

## 2018-09-20 ENCOUNTER — Other Ambulatory Visit: Payer: Self-pay | Admitting: Nurse Practitioner

## 2018-09-21 ENCOUNTER — Encounter: Payer: Self-pay | Admitting: Nurse Practitioner

## 2018-09-23 ENCOUNTER — Other Ambulatory Visit: Payer: Self-pay | Admitting: Nurse Practitioner

## 2018-09-23 DIAGNOSIS — E119 Type 2 diabetes mellitus without complications: Secondary | ICD-10-CM

## 2018-09-23 MED ORDER — ACCU-CHEK AVIVA PLUS W/DEVICE KIT: PACK | 0 refills | Status: DC

## 2018-09-23 MED ORDER — GLUCOSE BLOOD VI STRP: ORAL_STRIP | 3 refills | Status: DC

## 2018-09-24 DIAGNOSIS — I739 Peripheral vascular disease, unspecified: Secondary | ICD-10-CM | POA: Diagnosis not present

## 2018-09-24 DIAGNOSIS — L603 Nail dystrophy: Secondary | ICD-10-CM | POA: Diagnosis not present

## 2018-09-24 DIAGNOSIS — L84 Corns and callosities: Secondary | ICD-10-CM | POA: Diagnosis not present

## 2018-09-24 DIAGNOSIS — E1151 Type 2 diabetes mellitus with diabetic peripheral angiopathy without gangrene: Secondary | ICD-10-CM | POA: Diagnosis not present

## 2018-09-27 ENCOUNTER — Other Ambulatory Visit: Payer: Self-pay | Admitting: Nurse Practitioner

## 2018-09-29 DIAGNOSIS — F250 Schizoaffective disorder, bipolar type: Secondary | ICD-10-CM | POA: Diagnosis not present

## 2018-10-19 ENCOUNTER — Other Ambulatory Visit: Payer: Self-pay | Admitting: Nurse Practitioner

## 2018-10-20 ENCOUNTER — Other Ambulatory Visit: Payer: Self-pay

## 2018-10-20 ENCOUNTER — Encounter: Payer: Medicare HMO | Attending: Internal Medicine | Admitting: *Deleted

## 2018-10-20 DIAGNOSIS — E119 Type 2 diabetes mellitus without complications: Secondary | ICD-10-CM | POA: Diagnosis not present

## 2018-10-20 NOTE — Patient Instructions (Signed)
Plan:   Aim for 2 Carb Choices per meal (30 grams) +/- 1 either way   Aim for 0-1 Carbs per snack if hungry   Enjoy your vegetables!  Include protein in moderation with your meals and snacks  Continue reducing your salt intake to help with water retention and blood pressure  Continue doing Arm Chair exercises for 15-30 minutes daily as well as Zumba on Tuesday and Thursday as tolerated   Continue with the band stretching exercises and walking daily too. Good job!  I'm so glad you are feeling better, more energetic.   Continue checking BG once a day in AM  Continue taking medication as prescribed

## 2018-10-20 NOTE — Progress Notes (Signed)
Diabetes Self-Management Education  Visit Type:  Follow-up  Appt. Start Time: 1000 Appt. End Time: 1030  10/20/2018  [name], identified by name and date of birth, is a 68 y.o. female with a diagnosis of Diabetes:  Patient A1c is at 6.8%, an increase but still within target range. She states she continues with her exercises at home with total of 30 minutes most days broken into 10-15 minute increments. She also continues to eat plenty of vegetables with her meals. She reports she has run out of her diabetes medication Xultophy, and needs a new Rx for her [place] insurance to cover it. Since she ran out, she states her BG have been over 300 mg/dl. ASSESSMENT  Height 5\' 4"  (1.626 m), weight 265 lb 6.4 oz (120.4 kg). Body mass index is 45.56 kg/m.   Learning Objective:  Patient will have a greater understanding of diabetes self-management. Patient education plan is to attend individual and/or group sessions per assessed needs and concerns.  Plan:   Patient Instructions  Plan:   Aim for 2 Carb Choices per meal (30 grams) +/- 1 either way   Aim for 0-1 Carbs per snack if hungry   Enjoy your vegetables!  Include protein in moderation with your meals and snacks  Continue reducing your salt intake to help with water retention and blood pressure  Continue doing Arm Chair exercises for 15-30 minutes daily as well as Zumba on Tuesday and Thursday as tolerated   Continue with the band stretching exercises and walking daily too. Good job!  I'm so glad you are feeling better, more energetic.   Continue checking BG once a day in AM  Continue taking medication as prescribed  I reviewed with you today the action of NPH insulin and that it can be purchased at [facility] as ReliOn NPH in a vial for $25/ vial. This could be an option if it will take some time to get the Xultophy medication refilled, and less expensive if it works for you. You may want to ask your MD about this possibility.    Expected Outcomes:   Continued control of BG and potential weight loss  Education material provided: Insulin Action handout  If problems or questions, patient to contact team via:  Phone  Future DSME appointment: -  01/26/2019

## 2018-10-28 ENCOUNTER — Telehealth: Payer: Self-pay | Admitting: *Deleted

## 2018-10-28 NOTE — Telephone Encounter (Signed)
unable to leave message - phone hangs up after 3 rings x3    need to schedule televist with dr Margaretann Loveless 4/23/

## 2018-11-02 NOTE — Telephone Encounter (Signed)
Spoke with pt she states that she would like a telephone visit as she does not know if she has a smart phone or not no email or mychart, she does have BP cuff and scale. She will be ready for pre-reg call about 830am.

## 2018-11-02 NOTE — Telephone Encounter (Signed)
Left message to call back need to schedule appt  4/23 w/dr  acharya- unable to contact emergency contact as well

## 2018-11-03 ENCOUNTER — Telehealth: Payer: Self-pay | Admitting: Internal Medicine

## 2018-11-03 NOTE — Telephone Encounter (Signed)
Home phone only/ virtual consent/ declined my chart/ pre reg completed

## 2018-11-04 ENCOUNTER — Encounter: Payer: Self-pay | Admitting: Internal Medicine

## 2018-11-04 ENCOUNTER — Telehealth: Payer: Self-pay | Admitting: *Deleted

## 2018-11-04 ENCOUNTER — Telehealth (INDEPENDENT_AMBULATORY_CARE_PROVIDER_SITE_OTHER): Payer: Medicare HMO | Admitting: Internal Medicine

## 2018-11-04 VITALS — BP 142/82 | HR 101 | Temp 97.6°F | Ht 64.0 in | Wt 268.0 lb

## 2018-11-04 DIAGNOSIS — G4733 Obstructive sleep apnea (adult) (pediatric): Secondary | ICD-10-CM

## 2018-11-04 DIAGNOSIS — E785 Hyperlipidemia, unspecified: Secondary | ICD-10-CM

## 2018-11-04 DIAGNOSIS — I10 Essential (primary) hypertension: Secondary | ICD-10-CM

## 2018-11-04 DIAGNOSIS — E119 Type 2 diabetes mellitus without complications: Secondary | ICD-10-CM

## 2018-11-04 DIAGNOSIS — N184 Chronic kidney disease, stage 4 (severe): Secondary | ICD-10-CM

## 2018-11-04 DIAGNOSIS — F411 Generalized anxiety disorder: Secondary | ICD-10-CM

## 2018-11-04 DIAGNOSIS — R9431 Abnormal electrocardiogram [ECG] [EKG]: Secondary | ICD-10-CM

## 2018-11-04 MED ORDER — CARVEDILOL 3.125 MG PO TABS: 3.1250 mg | ORAL_TABLET | Freq: Two times a day (BID) | ORAL | 3 refills | Status: DC

## 2018-11-04 NOTE — Patient Instructions (Addendum)
   Medication Instructions:    start  Take Carvedilol 3.125 mg  One tablet by mouth Twice a day    If you need a refill on your cardiac medications before your next appointment, please call your pharmacy.   Lab work: Not needed If you have labs (blood work) drawn today and your tests are completely normal, you will receive your results only by: Marland Kitchen MyChart Message (if you have MyChart) OR . A paper copy in the mail If you have any lab test that is abnormal or we need to change your treatment, we will call you to review the results.  Testing/Procedures:\   WILL SCHEDULE [facility]--  Echo in 8 weeks Your physician has requested that you have an echocardiogram. Echocardiography is a painless test that uses sound waves to create images of your heart. It provides your doctor with information about the size and shape of your heart and how well your heart's chambers and valves are working. This procedure takes approximately one hour. There are no restrictions for this procedure.    Follow-Up: At [facility], you and your health needs are our priority.  As part of our continuing mission to provide you with exceptional heart care, we have created designated Provider Care Teams.  These Care Teams include your primary Cardiologist (physician) and Advanced Practice Providers (APPs -  Physician Assistants and Nurse Practitioners) who all work together to provide you with the care you need, when you need it. You will need a follow up appointment in     1 month .  Please call our office 2 months in advance to schedule this appointment.  You may see  Dr Margaretann Loveless  or one of the following Advanced Practice Providers on your designated Care Team:   Rosaria Ferries, PA-C . Jory Sims, DNP, ANP  Any Other Special Instructions Will Be Listed Below .

## 2018-11-04 NOTE — Progress Notes (Signed)
Virtual Visit via Telephone Note   This visit type was conducted due to national recommendations for restrictions regarding the COVID-19 Pandemic (e.g. social distancing) in an effort to limit this patient's exposure and mitigate transmission in our community.  Due to her co-morbid illnesses, this patient is at least at moderate risk for complications without adequate follow up.  This format is felt to be most appropriate for this patient at this time.  The patient did not have access to video technology/had technical difficulties with video requiring transitioning to audio format only (telephone).  All issues noted in this document were discussed and addressed.  No physical exam could be performed with this format.  Please refer to the patient's chart for her  consent to telehealth for [facility].   Evaluation Performed:  Follow-up visit  Date:  11/04/2018   ID:  Kimberly Gross, Kimberly Gross 06/06/1951, MRN [id number]  Patient Location: Home Provider Location: Home  PCP:  [name], FNP  Cardiologist:  No primary care provider on file.  Electrophysiologist:  None   Chief Complaint:  HTN, DM2, abnormal ECG  History of Present Illness:    Kimberly Gross is a 68 y.o. female with past medical history of hypertension, diabetes mellitus for 10 years, chronic kidney disease stage IV, who is referred by [name] FNP for assessment of risk factors and abnormal ECG.  Patient tells me that primarily her symptoms are lower extremity edema and daily fatigue.  Sometimes she does not feel like changing out of her pajamas or getting out of bed.  She has known chronic kidney disease and has been instructed to drink plenty of water, however she also has a prescription for Lasix 40 mg daily.  Heart failure assessment challenging to perform by phone.  The patient however notes that she has had stable weights between 260 and 270 pounds for many years, and does not see significant changes in her daily  weights.  This is encouraging from a volume standpoint.  She is not short of breath with activity and denies chest discomfort with exertion.  She denies significant PND, orthopnea.  She does endorse lower extremity swelling.  She faithfully takes her blood pressure and pulse daily as well as her weights and her blood sugar.  She has had diabetes for approximately 10 years and has endorgan damage as a result, including renal disease and vision sequelae.  She follows with an ophthalmologist as well as a nephrologist.  An ECG was performed as part of a wellness visit with her primary provider demonstrating LVH criteria and right bundle branch block with left anterior fascicular block.  She denies palpitations.  She denies dizziness, lightheadedness, presyncope, or syncope.  She takes amlodipine for hypertension and notes having hypertension for many years.  She does not know what her medications are for, and does not recall if she has been on alternate antihypertensive therapy in the past.  She denies any known antihypertensive drug allergies, and  she describes seizures with Thorazine. The patient does not have symptoms concerning for COVID-19 infection (fever, chills, cough, or new shortness of breath).    Past Medical History:  Diagnosis Date   Anemia    Anxiety    Bipolar 1 disorder ([place])    Depression    Diabetes mellitus without complication ([place])    Hyperlipemia    Hypertension    Occasional tremors    Past Surgical History:  Procedure Laterality Date   VAGINAL HYSTERECTOMY  Current Meds  Medication Sig   ACCU-CHEK SOFTCLIX LANCETS lancets 1 needle in device to check gulcose   acetaminophen (TYLENOL) 500 MG tablet Take 1,000 mg by mouth 2 (two) times a day.   Alcohol Swabs (B-D SINGLE USE SWABS REGULAR) PADS    amLODipine (NORVASC) 10 MG tablet TAKE 1 TABLET EVERY DAY   aspirin 81 MG tablet Take 81 mg by mouth daily.    atorvastatin (LIPITOR) 40 MG tablet  Take 1 tablet (40 mg total) by mouth at bedtime.   Blood Glucose Monitoring Suppl (ACCU-CHEK AVIVA PLUS) w/Device KIT Check blood sugar twice a day   clonazePAM (KLONOPIN) 0.5 MG tablet Take 1 tablet (0.5 mg total) by mouth 2 (two) times daily.   divalproex (DEPAKOTE) 500 MG DR tablet 500 mg. 2 pills 2 times per day   DM-APAP-CPM (CORICIDIN HBP PO) Take by mouth as needed.   fenofibrate 160 MG tablet TAKE 1 TABLET EVERY DAY   ferrous sulfate 325 (65 FE) MG tablet Take 325 mg by mouth daily with breakfast.   furosemide (LASIX) 40 MG tablet Take 1 tab Monday, wed and Friday   glucose blood (ACCU-CHEK AVIVA PLUS) test strip Use as instructed   Multiple Vitamins-Minerals (MULTIVITAMIN ADULT PO) Use as directed 1 tablet in the mouth or throat daily.   vitamin B-12 (CYANOCOBALAMIN) 250 MCG tablet Take 250 mcg by mouth daily.   XULTOPHY 100-3.6 UNIT-MG/ML SOPN INJECT 20 UNITS BY SUBCUTANEOUS ROUTE EVERY DAY (Patient taking differently: Inject 30 Units into the skin daily. )     Allergies:   Chlorpromazine   Social History   Tobacco Use   Smoking status: Never Smoker   Smokeless tobacco: Never Used  Substance Use Topics   Alcohol use: No   Drug use: No     Family Hx: The patient's family history includes Heart attack in her father; Hypertension in her mother.  ROS:   Please see the history of present illness.     All other systems reviewed and are negative.   Prior CV studies:   The following studies were reviewed today:  ECG 09/01/2018  Labs/Other Tests and Data Reviewed:    EKG:  An ECG dated 09/01/2018 was personally reviewed today and demonstrated:  Sinus rhythm, right bundle branch block, left anterior fascicular block, LVH criteria.  Recent Labs: 09/01/2018: ALT 27; BUN 42; Creatinine, Ser 2.29; Potassium 4.6; Sodium 142; TSH 1.610   Recent Lipid Panel Lab Results  Component Value Date/Time   CHOL 183 09/01/2018 03:49 PM   TRIG 266 (H) 09/01/2018 03:49 PM     HDL 49 09/01/2018 03:49 PM   CHOLHDL 3.7 09/01/2018 03:49 PM   CHOLHDL 2.7 08/23/2010 10:38 PM   LDLCALC 81 09/01/2018 03:49 PM    Wt Readings from Last 3 Encounters:  11/04/18 268 lb (121.6 kg)  10/20/18 265 lb 6.4 oz (120.4 kg)  09/10/18 261 lb 6.4 oz (118.6 kg)     Objective:    Vital Signs:  BP (!) 142/82    Pulse (!) 101    Temp 97.6 F (36.4 C)    Ht '5\' 4"'$  (1.626 m)    Wt 268 lb (121.6 kg)    BMI 46.00 kg/m    VITAL SIGNS:  reviewed GEN:  no acute distress RESPIRATORY:  normal respiratory effort PSYCH:  normal affect, Alert and oriented x3.  ASSESSMENT & PLAN:    1. Essential hypertension   2. Hyperlipidemia, unspecified hyperlipidemia type   3. Type 2 diabetes mellitus  without complication, without long-term current use of insulin ([place])   4. OSA (obstructive sleep apnea)   5. Anxiety state   6. Abnormal electrocardiogram (ECG) (EKG)   7. CKD (chronic kidney disease) stage 4, GFR 15-29 ml/min (HCC)    Kimberly Gross has risk factors for coronary artery disease including diabetes with evidence of endorgan dysfunction, longstanding hypertension, and family history of CAD.  She also is obese.  In reviewing a CT abdomen pelvis from October 2015, the distal coronary arteries can be partially visualized, and it appears that she may have some mild coronary artery calcifications.  However she is not at this time having exertional symptoms.  An evaluation into her lower extremity edema and left ventricular hypertrophy is warranted on a nonemergent basis, we will get an echocardiogram to assess these issues in approximately 8 weeks.  This is to protect the patient from exposure to COVID-19.  She requires additional therapy for her blood pressure since she is above goal with a history of diabetes.  With her renal dysfunction this limits our options, however I believe she would be a good candidate for low-dose carvedilol as an initial therapy.  We will start with carvedilol 3.125 mg  twice daily.  She has a follow-up appointment with her primary care provider in 1 month, and response to therapy should be assessed at that time.  I will also plan to have a phone conversation with the patient in 1 month to follow-up.  Her triglycerides are elevated and she has hyperlipidemia, however fibrate therapy in combination with statin therapy is not optimal.  Would consider dietary modifications to address hypertriglyceridemia and best diabetes control, and continuation of statin therapy.   COVID-19 Education: The signs and symptoms of COVID-19 were discussed with the patient and how to seek care for testing (follow up with PCP or arrange E-visit).  The importance of social distancing was discussed today.  Time:   Today, I have spent 30 minutes with the patient with telehealth technology discussing the above problems.   Medication Adjustments/Labs and Tests Ordered: Current medicines are reviewed at length with the patient today.  Concerns regarding medicines are outlined above.   Tests Ordered: Orders Placed This Encounter  Procedures   ECHOCARDIOGRAM COMPLETE    Medication Changes: Meds ordered this encounter  Medications   carvedilol (COREG) 3.125 MG tablet    Sig: Take 1 tablet (3.125 mg total) by mouth 2 (two) times daily.    Dispense:  180 tablet    Refill:  3    Disposition:  Follow up in 1 month(s)  Signed, Elouise Munroe, MD  11/04/2018 10:34 AM    [facility]

## 2018-11-04 NOTE — Telephone Encounter (Signed)
Spoke with patient reviewed  Instructions from visit . appt made for 12/08/18 at 9 am   medication sent to [place] , echo order. Patient verbalized understanding  AVS SUMMARY WIL BE MAILED TO PATIENT

## 2018-11-15 NOTE — Telephone Encounter (Signed)
LVM for pt to call office to have appt changed from the afternoon to a morning or virtual 11/15/18

## 2018-11-22 ENCOUNTER — Other Ambulatory Visit: Payer: Self-pay | Admitting: Nurse Practitioner

## 2018-11-26 DIAGNOSIS — G4733 Obstructive sleep apnea (adult) (pediatric): Secondary | ICD-10-CM | POA: Diagnosis not present

## 2018-11-29 ENCOUNTER — Other Ambulatory Visit: Payer: Self-pay | Admitting: Internal Medicine

## 2018-11-29 ENCOUNTER — Telehealth: Payer: Self-pay

## 2018-11-29 NOTE — Telephone Encounter (Signed)
Patient called stating she has an appointment on 05/20 but has not yet received a call from the nurse and she also stated she has a earache and sore throat.  RETURNED PT CALL AND NOTIFIED HER THAT I WILL CALL HER ON THE DAY OF HER APPOINTMENT TO GET HER READY FOR HER VIRTUAL APPOINTMENT. PATIENT INFORMED ME THAT SHE IS TAKING ROBITUSSIN AND THROAT LOZENGES EVERY 6 HOURS AND IT IS HELPING. NOTIFIED HER THAT THE DAY OF HER APPOINTMENT SHE CAN DISCUSS IT WITH JANECE MOORE FNP-BC. YRL,RMA

## 2018-12-01 ENCOUNTER — Other Ambulatory Visit: Payer: Self-pay

## 2018-12-01 ENCOUNTER — Ambulatory Visit (INDEPENDENT_AMBULATORY_CARE_PROVIDER_SITE_OTHER): Payer: Medicare HMO | Admitting: Nurse Practitioner

## 2018-12-01 ENCOUNTER — Encounter: Payer: Self-pay | Admitting: Nurse Practitioner

## 2018-12-01 VITALS — BP 142/96 | HR 93 | Temp 98.1°F | Wt 256.0 lb

## 2018-12-01 DIAGNOSIS — H9202 Otalgia, left ear: Secondary | ICD-10-CM

## 2018-12-01 DIAGNOSIS — E119 Type 2 diabetes mellitus without complications: Secondary | ICD-10-CM

## 2018-12-01 MED ORDER — LORATADINE 10 MG PO TABS: 10.0000 mg | ORAL_TABLET | Freq: Every day | ORAL | 1 refills | Status: DC

## 2018-12-01 NOTE — Progress Notes (Signed)
Virtual Visit via Video (Doxy[employer])   This visit type was conducted due to national recommendations for restrictions regarding the COVID-19 Pandemic (e.g. social distancing) in an effort to limit this patient's exposure and mitigate transmission in our community.  Patients identity confirmed using two different identifiers.  This format is felt to be most appropriate for this patient at this time.  All issues noted in this document were discussed and addressed.  No physical exam was performed (except for noted visual exam findings with Video Visits).    Date:  12/09/2018   ID:  Kimberly Gross, DOB 09-09-1950, MRN [id number]  Patient Location:  Home - spoke with Kyung Rudd  Provider location:   Office    Chief Complaint:    History of Present Illness:    Kimberly Gross is a 68 y.o. female who presents via telephone conferencing for a telehealth visit today.    The patient does not have symptoms concerning for COVID-19 infection (fever, chills, cough, or new shortness of breath).   Diabetes  She presents for her follow-up diabetic visit. She has type 2 diabetes mellitus. Her disease course has been stable. There are no hypoglycemic associated symptoms. Pertinent negatives for hypoglycemia include no dizziness or headaches. Pertinent negatives for diabetes include no blurred vision. There are no hypoglycemic complications. Symptoms are stable. There are no diabetic complications. Risk factors for coronary artery disease include obesity and sedentary lifestyle. Her weight is stable. (Blood sugars from 99-160.  )  Otalgia   There is pain in the left ear. This is a new problem. The current episode started in the past 7 days. The problem has been gradually worsening. There has been no fever. The patient is experiencing no pain. Associated symptoms include coughing and a sore throat. Pertinent negatives include no abdominal pain or headaches. She has tried nothing for the symptoms. The  treatment provided no relief. There is no history of a chronic ear infection or hearing loss.     Past Medical History:  Diagnosis Date  . Anemia   . Anxiety   . Bipolar 1 disorder ([place])   . Depression   . Diabetes mellitus without complication ([place])   . Hyperlipemia   . Hypertension   . Occasional tremors    Past Surgical History:  Procedure Laterality Date  . VAGINAL HYSTERECTOMY       Current Meds  Medication Sig  . Accu-Chek Softclix Lancets lancets USE AS DIRECTED  TO CHECK BLOOD GLUCOSE  . acetaminophen (TYLENOL) 500 MG tablet Take 1,000 mg by mouth 2 (two) times a day.  . Alcohol Swabs (B-D SINGLE USE SWABS REGULAR) PADS   . amLODipine (NORVASC) 10 MG tablet TAKE 1 TABLET EVERY DAY  . aspirin 81 MG tablet Take 81 mg by mouth daily.   Marland Kitchen atorvastatin (LIPITOR) 40 MG tablet Take 1 tablet (40 mg total) by mouth at bedtime.  . Blood Glucose Monitoring Suppl (ACCU-CHEK AVIVA PLUS) w/Device KIT Check blood sugar twice a day  . carvedilol (COREG) 3.125 MG tablet Take 1 tablet (3.125 mg total) by mouth 2 (two) times daily.  . clonazePAM (KLONOPIN) 0.5 MG tablet Take 1 tablet (0.5 mg total) by mouth 2 (two) times daily.  . divalproex (DEPAKOTE) 500 MG DR tablet 500 mg. 2 pills 2 times per day  . DM-APAP-CPM (CORICIDIN HBP PO) Take by mouth as needed.  . ferrous sulfate 325 (65 FE) MG tablet Take 325 mg by mouth daily with breakfast.  . glucose blood (  ACCU-CHEK AVIVA PLUS) test strip Use as instructed  . Multiple Vitamins-Minerals (MULTIVITAMIN ADULT PO) Use as directed 1 tablet in the mouth or throat daily.  . vitamin B-12 (CYANOCOBALAMIN) 250 MCG tablet Take 250 mcg by mouth daily.  [name] 100-3.6 UNIT-MG/ML SOPN INJECT 20 UNITS BY SUBCUTANEOUS ROUTE EVERY DAY (Patient taking differently: Inject 30 Units into the skin daily. )  . [DISCONTINUED] fenofibrate 160 MG tablet TAKE 1 TABLET EVERY DAY  . [DISCONTINUED] furosemide (LASIX) 40 MG tablet Take 1 tab Monday, wed and Friday      Allergies:   Chlorpromazine   Social History   Tobacco Use  . Smoking status: Never Smoker  . Smokeless tobacco: Never Used  Substance Use Topics  . Alcohol use: No  . Drug use: No     Family Hx: The patient's family history includes Heart attack in her father; Hypertension in her mother.  ROS:   Please see the history of present illness.    Review of Systems  Constitutional: Negative.   HENT: Positive for ear pain (right ear) and sore throat. Negative for congestion.   Eyes: Negative for blurred vision.  Respiratory: Positive for cough.   Cardiovascular: Negative.   Gastrointestinal: Negative for abdominal pain.  Neurological: Negative for dizziness and headaches.    All other systems reviewed and are negative.   Labs/Other Tests and Data Reviewed:    Recent Labs: 09/01/2018: ALT 27; BUN 42; Creatinine, Ser 2.29; Potassium 4.6; Sodium 142; TSH 1.610   Recent Lipid Panel Lab Results  Component Value Date/Time   CHOL 183 09/01/2018 03:49 PM   TRIG 266 (H) 09/01/2018 03:49 PM   HDL 49 09/01/2018 03:49 PM   CHOLHDL 3.7 09/01/2018 03:49 PM   CHOLHDL 2.7 08/23/2010 10:38 PM   LDLCALC 81 09/01/2018 03:49 PM    Wt Readings from Last 3 Encounters:  12/08/18 260 lb (117.9 kg)  12/01/18 256 lb (116.1 kg)  11/04/18 268 lb (121.6 kg)     Exam:    Vital Signs:  BP (!) 142/96 (BP Location: Left Arm, Patient Position: Sitting, Cuff Size: Large)   Pulse 93   Temp 98.1 F (36.7 C) (Oral)   Wt 256 lb (116.1 kg)   BMI 43.94 kg/m     Physical Exam  Constitutional: She is oriented to person, place, and time.  Neurological: She is alert and oriented to person, place, and time.  Psychiatric: Mood, memory, affect and judgment normal.  Unable to visualized due to telephone call  ASSESSMENT & PLAN:    1. Type 2 diabetes mellitus without complication, without long-term current use of insulin (HCC)  Chronic, improving,  I will refer to CCM for assistance with her  medications insurance does not cover Xultophy however this medication has been effective in managing her diabetes  Continue with current medications  Encouraged to limit intake of sugary foods and drinks  Encouraged to increase physical activity to 150 minutes per week - Referral to Chronic Care Management Services  2. Left ear pain  She is to take loratadine daily  If not better return call to office    COVID-19 Education: The signs and symptoms of COVID-19 were discussed with the patient and how to seek care for testing (follow up with PCP or arrange E-visit).  The importance of social distancing was discussed today.  Patient Risk:   After full review of this patients clinical status, I feel that they are at least moderate risk at this time.  Time:  Today, I have spent 14.45 minutes/ seconds with the patient with telehealth technology discussing above diagnoses.     Medication Adjustments/Labs and Tests Ordered: Current medicines are reviewed at length with the patient today.  Concerns regarding medicines are outlined above.   Tests Ordered: Orders Placed This Encounter  Procedures  . Referral to Chronic Care Management Services    Medication Changes: Meds ordered this encounter  Medications  . loratadine (CLARITIN) 10 MG tablet    Sig: Take 1 tablet (10 mg total) by mouth daily.    Dispense:  30 tablet    Refill:  1    Disposition:  Follow up in 3 month(s)  Signed, [name], FNP

## 2018-12-03 ENCOUNTER — Telehealth: Payer: Self-pay | Admitting: Internal Medicine

## 2018-12-03 NOTE — Telephone Encounter (Signed)
home phone/Consent/ my chart declined/ insurance verified/ pre reg completed

## 2018-12-04 ENCOUNTER — Other Ambulatory Visit: Payer: Self-pay | Admitting: Nurse Practitioner

## 2018-12-08 ENCOUNTER — Encounter: Payer: Self-pay | Admitting: Internal Medicine

## 2018-12-08 ENCOUNTER — Ambulatory Visit: Payer: Self-pay

## 2018-12-08 ENCOUNTER — Telehealth (INDEPENDENT_AMBULATORY_CARE_PROVIDER_SITE_OTHER): Payer: Medicare HMO | Admitting: Internal Medicine

## 2018-12-08 DIAGNOSIS — E785 Hyperlipidemia, unspecified: Secondary | ICD-10-CM

## 2018-12-08 DIAGNOSIS — E119 Type 2 diabetes mellitus without complications: Secondary | ICD-10-CM

## 2018-12-08 DIAGNOSIS — I10 Essential (primary) hypertension: Secondary | ICD-10-CM

## 2018-12-08 NOTE — Chronic Care Management (AMB) (Signed)
Chronic Care Management    Clinical Social Work General Note  12/08/2018 Name: Kimberly Gross MRN: [id number] DOB: 11-05-1950  Kimberly Gross is a 68 y.o. year old female who is a primary care patient of [name], [place]. The CCM was consulted to assist the patient with chronic care management and care coordination of resource needs.  Kimberly Gross was given information about Chronic Care Management services today including:  1. CCM service includes personalized support from designated clinical staff supervised by her physician, including individualized plan of care and coordination with other care providers 2. 24/7 contact phone numbers for assistance for urgent and routine care needs. 3. Service will only be billed when office clinical staff spend 20 minutes or more in a month to coordinate care. 4. Only one practitioner may furnish and bill the service in a calendar month. 5. The patient may stop CCM services at any time (effective at the end of the month) by phone call to the office staff. 6. The patient will be responsible for cost sharing (co-pay) of up to 20% of the service fee (after annual deductible is met).  Patient agreed to services and verbal consent obtained.   Review of patient status, including review of consultants reports, relevant laboratory and other test results, and collaboration with appropriate care team members and the patient's provider was performed as part of comprehensive patient evaluation and provision of chronic care management services.    SDOH (Social Determinants of Health) screening performed today. The patient does not report any SDOH challenges but does express the desire to learn more about her health plan benefits as well as identify alternate transportation options.  Goals Addressed            This Visit's Progress     Patient Stated   . "I am having trouble affording my new diabetes medicine" (pt-stated)       Current Barriers:  . Financial  constraints  . Reported payor restrictions on coverage  Clinical Social Work Clinical Goal(s):  Marland Kitchen Over the next 30 days, patient will work with embedded Pharm D Lottie Dawson to address needs related to medication assistance  Interventions: . Patient interviewed and appropriate assessments performed . Discussed plans with patient for ongoing care management follow up and provided patient with direct contact information for care management team  . Provided the patient with education on the CCM team and clinician role . Collaboration with embedded Pharm D Lottie Dawson who will follow up with the patient regarding medication costs and options  Patient Self Care Activities:  . Self administers medications as prescribed . Calls pharmacy for medication refills . Calls provider office for new concerns or questions  Initial goal documentation     . "I want more transportation options" (pt-stated)       Current Barriers:  . Limited caregiver support to assist with transportation . Lacks knowledge of payor benefits  Clinical Social Work Clinical Goal(s):  Marland Kitchen Over the next 30 days, client will work with SW to address concerns related to transportation challenges  Interventions: . Patient interviewed and appropriate assessments performed . Provided patient with information about transportation resources . Discussed plans with patient for ongoing care management follow up and provided patient with direct contact information for care management team . Assisted patient/caregiver with obtaining information about health plan benefits - the patient has access to 24 one way trips per year through her [employer] benefit . Scheduled follow up appointment to the patient in the  next 7-10 days to assist with transportation arrangements for an upcomming echocardiogram   Patient Self Care Activities:  . Self administers medications as prescribed . Calls pharmacy for medication refills . Calls provider office  for new concerns or questions  Initial goal documentation     . "I would like more information on my OTC benefit" (pt-stated)       Current Barriers:  . Limited knowledge of what products are available under her OTC benefit  Clinical Social Work Clinical Goal(s):  Over the next 45 days patient will express increased knowledge of OTC benefit by successfully ordering supplies  Interventions: . Patient interviewed and appropriate assessments performed . Provided patient with information about OTC Catalog benefit . Assisted patient/caregiver with obtaining information about health plan benefits - determined the patient has $50.00 per quarter . Requested a new catalog be mailed to the patients home for her review  Patient Self Care Activities:  . Self administers medications as prescribed . Calls pharmacy for medication refills  Initial goal documentation         Follow Up Plan: SW will follow up with patient by phone over the next 1-2 weeks      Daneen Schick, BSW, CDP Social Worker, Certified Dementia Practitioner [facility] [phone]  Total time spent performing care coordination and/or care management activities with the patient by phone or face to face = 65 minutes.

## 2018-12-08 NOTE — Patient Instructions (Signed)
Medication Instructions:  Your Physician recommend you continue on your current medication as directed.    If you need a refill on your cardiac medications before your next appointment, please call your pharmacy.   Lab work: None  Testing/Procedures: Your physician has requested that you have an echocardiogram in 3 months. Echocardiography is a painless test that uses sound waves to create images of your heart. It provides your doctor with information about the size and shape of your heart and how well your heart's chambers and valves are working. This procedure takes approximately one hour. There are no restrictions for this procedure. [address]   Follow-Up: Your physician recommends that you schedule a follow-up appointment in 3 months with an APP.

## 2018-12-08 NOTE — Patient Instructions (Signed)
Social Worker Visit Information  Goals we discussed today:  Goals Addressed            This Visit's Progress     Patient Stated   . "I am having trouble affording my new diabetes medicine" (pt-stated)       Current Barriers:  . Financial constraints  . Reported payor restrictions on coverage  Clinical Social Work Clinical Goal(s):  Marland Kitchen Over the next 30 days, patient will work with embedded Pharm D Lottie Dawson to address needs related to medication assistance  Interventions: . Patient interviewed and appropriate assessments performed . Discussed plans with patient for ongoing care management follow up and provided patient with direct contact information for care management team  . Provided the patient with education on the CCM team and clinician role . Collaboration with embedded Pharm D Lottie Dawson who will follow up with the patient regarding medication costs and options  Patient Self Care Activities:  . Self administers medications as prescribed . Calls pharmacy for medication refills . Calls provider office for new concerns or questions  Initial goal documentation     . "I want more transportation options" (pt-stated)       Current Barriers:  . Limited caregiver support to assist with transportation . Lacks knowledge of payor benefits  Clinical Social Work Clinical Goal(s):  Marland Kitchen Over the next 30 days, client will work with SW to address concerns related to transportation challenges  Interventions: . Patient interviewed and appropriate assessments performed . Provided patient with information about transportation resources . Discussed plans with patient for ongoing care management follow up and provided patient with direct contact information for care management team . Assisted patient/caregiver with obtaining information about health plan benefits - the patient has access to 24 one way trips per year through her [employer] benefit . Scheduled follow up appointment to the  patient in the next 7-10 days to assist with transportation arrangements for an upcomming echocardiogram   Patient Self Care Activities:  . Self administers medications as prescribed . Calls pharmacy for medication refills . Calls provider office for new concerns or questions  Initial goal documentation     . "I would like more information on my OTC benefit" (pt-stated)       Current Barriers:  . Limited knowledge of what products are available under her OTC benefit  Clinical Social Work Clinical Goal(s):  Over the next 45 days patient will express increased knowledge of OTC benefit by successfully ordering supplies  Interventions: . Patient interviewed and appropriate assessments performed . Provided patient with information about OTC Catalog benefit . Assisted patient/caregiver with obtaining information about health plan benefits - determined the patient has $50.00 per quarter . Requested a new catalog be mailed to the patients home for her review  Patient Self Care Activities:  . Self administers medications as prescribed . Calls pharmacy for medication refills  Initial goal documentation         Materials provided: Verbal education about CCM program and payor benefits provided by phone  Ms. Sabo was given information about Chronic Care Management services today including:  1. CCM service includes personalized support from designated clinical staff supervised by her physician, including individualized plan of care and coordination with other care providers 2. 24/7 contact phone numbers for assistance for urgent and routine care needs. 3. Service will only be billed when office clinical staff spend 20 minutes or more in a month to coordinate care. 4. Only one practitioner may furnish  and bill the service in a calendar month. 5. The patient may stop CCM services at any time (effective at the end of the month) by phone call to the office staff. 6. The patient will be  responsible for cost sharing (co-pay) of up to 20% of the service fee (after annual deductible is met).  Patient agreed to services and verbal consent obtained.   The patient verbalized understanding of instructions provided today and declined a print copy of patient instruction materials.   Follow up plan: SW will follow up with patient by phone over the next 1-2 weeks   Daneen Schick, BSW, CDP Social Worker, Certified Dementia Practitioner [facility] [phone]

## 2018-12-08 NOTE — Progress Notes (Signed)
Virtual Visit via Telephone Note   This visit type was conducted due to national recommendations for restrictions regarding the COVID-19 Pandemic (e.g. social distancing) in an effort to limit this patient's exposure and mitigate transmission in our community.  Due to her co-morbid illnesses, this patient is at least at moderate risk for complications without adequate follow up.  This format is felt to be most appropriate for this patient at this time.  The patient did not have access to video technology/had technical difficulties with video requiring transitioning to audio format only (telephone).  All issues noted in this document were discussed and addressed.  No physical exam could be performed with this format.  Please refer to the patient's chart for her  consent to telehealth for [facility].   Date:  12/08/2018   ID:  Kimberly Gross, DOB 31-Jul-1950, MRN [id number]  Patient Location: Home Provider Location: Office  PCP:  Kimberly Brine, FNP  Cardiologist:  Kimberly Munroe, MD  Electrophysiologist:  None   Evaluation Performed:  Follow-Up Visit  Chief Complaint:  F/u HTN, DM2, and abnormal ECG  History of Present Illness:    Kimberly Gross is a 68 y.o. female with past medical history of hypertension, diabetes mellitus for 10 years, chronic kidney disease stage IV, who presents for follow up.  She unfortunately lost her brother to a stroke on Saturday, and shares with me that she is taking it hard, but remains in good spirits.  I expressed my condolences and best wishes for her family.  From a cardiopulmonary standpoint she is doing well without significant concern.  She denies chest pain, shortness of breath.  She has chronic lower extremity edema for which she takes Lasix.  She takes her blood pressure, pulse, weight, and temperature daily.  She has had adequately controlled blood pressures on newly initiated carvedilol 3.125 mg twice daily, without concerns of bradycardia.   She continues on atorvastatin and has been placed on a fibrillate by her primary nurse practitioner for hypertriglyceridemia.  We discussed that diet lifestyle modification are necessary to treat triglyceride elevations, and cautious use of fibrates with concomitant statin use.  Had a sore throat and ear infection recently, but is feeling slightly better. Was not tested for COVID 19.  The patient denies chest pressure, dyspnea at rest or with exertion, palpitations, PND, orthopnea, or leg swelling. Denies syncope or presyncope. Denies dizziness or lightheadedness.  The patient does not have symptoms concerning for COVID-19 infection (fever, chills, cough, or new shortness of breath).    Past Medical History:  Diagnosis Date  . Anemia   . Anxiety   . Bipolar 1 disorder ([place])   . Depression   . Diabetes mellitus without complication ([place])   . Hyperlipemia   . Hypertension   . Occasional tremors    Past Surgical History:  Procedure Laterality Date  . VAGINAL HYSTERECTOMY       Current Meds  Medication Sig  . Accu-Chek Softclix Lancets lancets USE AS DIRECTED  TO CHECK BLOOD GLUCOSE  . acetaminophen (TYLENOL) 500 MG tablet Take 1,000 mg by mouth 2 (two) times a day.  . Alcohol Swabs (B-D SINGLE USE SWABS REGULAR) PADS   . amLODipine (NORVASC) 10 MG tablet TAKE 1 TABLET EVERY DAY  . aspirin 81 MG tablet Take 81 mg by mouth daily.   Marland Kitchen atorvastatin (LIPITOR) 40 MG tablet Take 1 tablet (40 mg total) by mouth at bedtime.  . Blood Glucose Monitoring Suppl (ACCU-CHEK AVIVA PLUS)  w/Device KIT Check blood sugar twice a day  . carvedilol (COREG) 3.125 MG tablet Take 1 tablet (3.125 mg total) by mouth 2 (two) times daily.  . clonazePAM (KLONOPIN) 0.5 MG tablet Take 1 tablet (0.5 mg total) by mouth 2 (two) times daily.  . divalproex (DEPAKOTE) 500 MG DR tablet 500 mg. 2 pills 2 times per day  . DM-APAP-CPM (CORICIDIN HBP PO) Take by mouth as needed.  . fenofibrate 160 MG tablet TAKE 1 TABLET  EVERY DAY  . ferrous sulfate 325 (65 FE) MG tablet Take 325 mg by mouth daily with breakfast.  . glucose blood (ACCU-CHEK AVIVA PLUS) test strip Use as instructed  . loratadine (CLARITIN) 10 MG tablet Take 1 tablet (10 mg total) by mouth daily.  . Multiple Vitamins-Minerals (MULTIVITAMIN ADULT PO) Use as directed 1 tablet in the mouth or throat daily.  . vitamin B-12 (CYANOCOBALAMIN) 250 MCG tablet Take 250 mcg by mouth daily.  [name] 100-3.6 UNIT-MG/ML SOPN INJECT 20 UNITS BY SUBCUTANEOUS ROUTE EVERY DAY (Patient taking differently: Inject 30 Units into the skin daily. )     Allergies:   Chlorpromazine   Social History   Tobacco Use  . Smoking status: Never Smoker  . Smokeless tobacco: Never Used  Substance Use Topics  . Alcohol use: No  . Drug use: No     Family Hx: The patient's family history includes Heart attack in her father; Hypertension in her mother.  ROS:   Please see the history of present illness.     All other systems reviewed and are negative.   Prior CV studies:   The following studies were reviewed today:  No new  Labs/Other Tests and Data Reviewed:    EKG:  No ECG reviewed.  Recent Labs: 09/01/2018: ALT 27; BUN 42; Creatinine, Ser 2.29; Potassium 4.6; Sodium 142; TSH 1.610   Recent Lipid Panel Lab Results  Component Value Date/Time   CHOL 183 09/01/2018 03:49 PM   TRIG 266 (H) 09/01/2018 03:49 PM   HDL 49 09/01/2018 03:49 PM   CHOLHDL 3.7 09/01/2018 03:49 PM   CHOLHDL 2.7 08/23/2010 10:38 PM   LDLCALC 81 09/01/2018 03:49 PM    Wt Readings from Last 3 Encounters:  12/08/18 260 lb (117.9 kg)  12/01/18 256 lb (116.1 kg)  11/04/18 268 lb (121.6 kg)     Objective:    Vital Signs:  BP 125/84   Pulse 90   Temp (!) 97.4 F (36.3 C)   Ht 5' 4"  (1.626 m)   Wt 260 lb (117.9 kg)   BMI 44.63 kg/m    VITAL SIGNS:  reviewed GEN:  no acute distress  ASSESSMENT & PLAN:    1. Essential hypertension   2. Hyperlipidemia, unspecified  hyperlipidemia type   3. Type 2 diabetes mellitus without complication, without long-term current use of insulin ([place])   4. OSA (obstructive sleep apnea)   5. Anxiety state   6. Abnormal electrocardiogram (ECG) (EKG)   7. CKD (chronic kidney disease) stage 4, GFR 15-29 ml/min (HCC)    HTN and LVH - we have planned an echocardiogram however she has infectious symptoms at this time. Since this is a nonemergent study, we will plan this around her next follow up. She is comfortable with that plan.  BP well controlled today, no changes needed.  HLD - Caution with use of fibrate and statin concomitantly. Can consider lipid recheck at next follow up and change in therapy. If any concerning symptoms, fibrate should be  discontinued. We discussed dietary strategies to improve triglycerides.   COVID-19 Education: The signs and symptoms of COVID-19 were discussed with the patient and how to seek care for testing (follow up with PCP or arrange E-visit).  The importance of social distancing was discussed today.  Time:   Today, I have spent 15 minutes with the patient with telehealth technology discussing the above problems.     Medication Adjustments/Labs and Tests Ordered: Current medicines are reviewed at length with the patient today.  Concerns regarding medicines are outlined above.   Tests Ordered: No orders of the defined types were placed in this encounter.   Medication Changes: No orders of the defined types were placed in this encounter.   Disposition:  Follow up in 3 month(s)  Signed, Kimberly Munroe, MD  12/08/2018 9:43 AM    [facility]

## 2018-12-09 ENCOUNTER — Encounter: Payer: Self-pay | Admitting: Nurse Practitioner

## 2018-12-09 NOTE — Chronic Care Management (AMB) (Signed)
Chronic Care Management   Note  12/09/2018 Name: Kimberly Gross MRN: [id number] DOB: 02-12-1951   Chronic Care Management   Initial Visit Note  12/08/2018 Name: Kimberly Gross MRN: [id number] DOB: 07-16-1950  Referred by: [name], FNP Reason for referral : Care Coordination   BRITANI BEATTIE is a 68 y.o. year old female who is a primary care patient of [name], [place]. The CCM team was consulted for assistance with chronic disease management and care coordination needs.   Review of patient status, including review of consultants reports, relevant laboratory and other test results, and collaboration with appropriate care team members and the patient's provider was performed as part of comprehensive patient evaluation and provision of chronic care management services.    I initiated and established the plan of care for Kimberly Gross during one on one collaboration with my clinical care management colleague Daneen Schick BSW who is also engaged with this patient to address social work needs.   Goals Addressed      Patient Stated   . "I am having trouble affording my new diabetes medicine" (pt-stated)       Current Barriers:  . Financial constraints  . Reported payor restrictions on coverage  Clinical Social Work Clinical Goal(s):  Kimberly Gross Over the next 30 days, patient will work with embedded Pharm D Lottie Dawson to address needs related to medication assistance  Interventions: . Patient interviewed and appropriate assessments performed . Discussed plans with patient for ongoing care management follow up and provided patient with direct contact information for care management team  . Provided the patient with education on the CCM team and clinician role . Collaboration with embedded Pharm D Lottie Dawson who will follow up with the patient regarding medication costs and options  Patient Self Care Activities:  . Self administers medications as prescribed . Calls pharmacy for  medication refills . Calls provider office for new concerns or questions  Initial goal documentation    . "I want more transportation options" (pt-stated)       Current Barriers:  . Limited caregiver support to assist with transportation . Lacks knowledge of payor benefits  Clinical Social Work Clinical Goal(s):  Kimberly Gross Over the next 30 days, client will work with SW to address concerns related to transportation challenges  Interventions: . Patient interviewed and appropriate assessments performed . Provided patient with information about transportation resources . Discussed plans with patient for ongoing care management follow up and provided patient with direct contact information for care management team . Assisted patient/caregiver with obtaining information about health plan benefits - the patient has access to 24 one way trips per year through her [employer] benefit . Scheduled follow up appointment to the patient in the next 7-10 days to assist with transportation arrangements for an upcomming echocardiogram   Patient Self Care Activities:  . Self administers medications as prescribed . Calls pharmacy for medication refills . Calls provider office for new concerns or questions  Initial goal documentation    . "I would like more information on my OTC benefit" (pt-stated)       Current Barriers:  . Limited knowledge of what products are available under her OTC benefit  Clinical Social Work Clinical Goal(s):  Over the next 45 days patient will express increased knowledge of OTC benefit by successfully ordering supplies  Interventions: . Patient interviewed and appropriate assessments performed . Provided patient with information about OTC Catalog benefit . Assisted patient/caregiver with obtaining information about health  plan benefits - determined the patient has $50.00 per quarter . Requested a new catalog be mailed to the patients home for her review  Patient Self Care  Activities:  . Self administers medications as prescribed . Calls pharmacy for medication refills  Initial goal documentation      Other   . Assist with Chronic Disease Management and Care Coordination Needs       Current Barriers:  Kimberly Gross Knowledge Barriers related to resources and support available to address needs related to Chronic disease management and Community Resources  Case Manager Clinical Goal(s):  Kimberly Gross Over the next 30 days, patient will work with the CCM team to address needs related to Chronic disease management, Medication Management and Community Resources  Interventions:  . Collaborated with BSW and initiated plan of care to address needs related to transportation, medication management and health plan related benefit information  Patient Self Care Activities:  . Attends all scheduled provider appointments . Calls pharmacy for medication refills . Calls provider office for new concerns or questions  Initial goal documentation        The CCM team will reach out to the patient again over the next 7-14 days.   Barb Merino, RN,CCM Care Management Coordinator [place] [facility]  Direct Phone: [phone]

## 2018-12-10 ENCOUNTER — Ambulatory Visit: Payer: Self-pay | Admitting: Pharmacist

## 2018-12-10 ENCOUNTER — Other Ambulatory Visit: Payer: Self-pay | Admitting: Pharmacy Technician

## 2018-12-10 DIAGNOSIS — I10 Essential (primary) hypertension: Secondary | ICD-10-CM

## 2018-12-10 DIAGNOSIS — E119 Type 2 diabetes mellitus without complications: Secondary | ICD-10-CM

## 2018-12-10 DIAGNOSIS — E785 Hyperlipidemia, unspecified: Secondary | ICD-10-CM

## 2018-12-10 NOTE — Patient Outreach (Signed)
[facility]) Care Management  12/10/2018  SHELENA CASTELLUCCIO 11-08-1950 [id number]                          Medication Assistance Referral  Referral From: [facility] RPh Jenne Pane (Clinic RPh)  Medication/Company: [name] / [employer] Patient application portion:  Education officer, museum portion: Faxed  to [employer]. [employer]  Follow up:  Will follow up with patient in 7-10 business days to confirm application(s) have been received.  Maud Deed Chana Bode [place] Certified Pharmacy Technician [place] [facility] Direct Dial:[phone]

## 2018-12-12 NOTE — Patient Instructions (Signed)
Visit Information  Goals Addressed            This Visit's Progress     Patient Stated   . "I am having trouble affording my new diabetes medicine" (pt-stated)       Current Barriers:  . Financial constraints  . Reported payor restrictions on coverage  Clinical Social Work Clinical Goal(s):  Marland Kitchen Over the next 30 days, patient will work with CCM PharmD to address needs related to medication assistance  Interventions: . Patient interviewed and appropriate assessments performed . Discussed plans with patient for ongoing care management follow up and provided patient with direct contact information for care management team  . Patient currently stable on Xutolphy 30 units daily, however insurance will no longer cover.  She reports FBG range of 90-160.  She realizes 160 is usually due to "not eating as she should", but states this is rare.  Last A1c was 6.8 and patient remains motivated to continue her efforts in controlling A1c. . Will work with [place], Etter Sjogren to apply for financial assistance for [place] through [employer]. . Comprehensive medication review performed.  Patient tolerating medications with no issues.  Patient Self Care Activities:  . Self administers medications as prescribed . Calls pharmacy for medication refills . Calls provider office for new concerns or questions  Initial goal documentation        The patient verbalized understanding of instructions provided today and declined a print copy of patient instruction materials.   The care management team will reach out to the patient again over the next 7 days.   Regina Eck, PharmD, BCPS Clinical Pharmacist, [facility] [place]: [phone]

## 2018-12-12 NOTE — Progress Notes (Signed)
  Chronic Care Management   Initial Visit Note  12/10/2018 Name: MAISEY DEANDRADE MRN: [id number] DOB: 07-19-1950  Referred by: [name], FNP Reason for referral : Chronic Care Management   SHIESHA JAHN is a 68 y.o. year old female who is a primary care patient of [name], [place]. The CCM team was consulted for assistance with chronic disease management and care coordination needs.   Review of patient status, including review of consultants reports, relevant laboratory and other test results, and collaboration with appropriate care team members and the patient's provider was performed as part of comprehensive patient evaluation and provision of chronic care management services.    I spoke with Ms. Inskeep by telephone today.  Objective:   Goals Addressed            This Visit's Progress     Patient Stated   . "I am having trouble affording my new diabetes medicine" (pt-stated)       Current Barriers:  . Financial constraints  . Reported payor restrictions on coverage  Clinical Social Work Clinical Goal(s):  Marland Kitchen Over the next 30 days, patient will work with CCM PharmD to address needs related to medication assistance  Interventions: . Patient interviewed and appropriate assessments performed . Discussed plans with patient for ongoing care management follow up and provided patient with direct contact information for care management team  . Patient currently stable on Xutolphy 30 units daily, however insurance will no longer cover.  She reports FBG range of 90-160.  She realizes 160 is usually due to "not eating as she should", but states this is rare.  Last A1c was 6.8 and patient remains motivated to continue her efforts in controlling A1c. . Will work with [place], Etter Sjogren to apply for financial assistance for [place] through [employer]. . Comprehensive medication review performed.  Patient tolerating medications with no issues.  Patient Self Care Activities:   . Self administers medications as prescribed . Calls pharmacy for medication refills . Calls provider office for new concerns or questions  Initial goal documentation         Plan:   The care management team will reach out to the patient again over the next 7 days.   Regina Eck, PharmD, BCPS Clinical Pharmacist, [facility] [place]: [phone]

## 2018-12-17 ENCOUNTER — Ambulatory Visit: Payer: Self-pay

## 2018-12-17 DIAGNOSIS — I10 Essential (primary) hypertension: Secondary | ICD-10-CM

## 2018-12-17 DIAGNOSIS — E119 Type 2 diabetes mellitus without complications: Secondary | ICD-10-CM

## 2018-12-17 NOTE — Chronic Care Management (AMB) (Signed)
  Chronic Care Management    Clinical Social Work Follow Up Note  12/17/2018 Name: Kimberly Gross MRN: [id number] DOB: [birth year]  Kimberly Gross is a 68 y.o. year old female who is a primary care patient of Kimberly Gross, [place]. The CCM team was consulted for assistance with [employer] and [employer].   Review of patient status, including review of consultants reports, other relevant assessments, and collaboration with appropriate care team members and the patient's provider was performed as part of comprehensive patient evaluation and provision of chronic care management services.     Goals Addressed            This Visit's Progress     Patient Stated   . "I want more transportation options" (pt-stated)   On track    Current Barriers:  . Limited caregiver support to assist with transportation . Lacks knowledge of payor benefits  Clinical Social Work Clinical Goal(s):  Marland Kitchen Over the next 30 days, client will work with SW to address concerns related to transportation challenges  Interventions: . Outbound call to the patient to assist with calling her [employer] transportation benefit . [place] with the patient  . Successfully arranged transportation to the patients upcomming June 12th appointment . Advised the patient how to contact [employer] when she is ready to return home . Mailed the patient a step by step guide indicating how to utilize this benefit for future appointments  Patient Self Care Activities:  . Self administers medications as prescribed . Calls pharmacy for medication refills . Calls provider office for new concerns or questions  Please see past updates related to this goal by clicking on the "Past Updates" button in the selected goal          Follow Up Plan: SW will follow up with patient by phone over the next 3 weeks to confirm receipt of mailed resource   Daneen Schick, [place], CDP Social Worker, Certified Dementia  Practitioner [place] [facility] [place] [facility] [phone]  Total time spent performing care coordination and/or care management activities with the patient by phone or face to face = 35 minutes.

## 2018-12-17 NOTE — Patient Instructions (Signed)
Social Worker Visit Information  Goals we discussed today:  Goals Addressed            This Visit's Progress     Patient Stated   . "I want more transportation options" (pt-stated)   On track    Current Barriers:  . Limited caregiver support to assist with transportation . Lacks knowledge of payor benefits  Clinical Social Work Clinical Goal(s):  Marland Kitchen Over the next 30 days, client will work with SW to address concerns related to transportation challenges  Interventions: . Outbound call to the patient to assist with calling her [employer] transportation benefit . [place] with the patient  . Successfully arranged transportation to the patients upcomming June 12th appointment . Advised the patient how to contact [employer] when she is ready to return home . Mailed the patient a step by step guide indicating how to utilize this benefit for future appointments  Patient Self Care Activities:  . Self administers medications as prescribed . Calls pharmacy for medication refills . Calls provider office for new concerns or questions  Please see past updates related to this goal by clicking on the "Past Updates" button in the selected goal          Materials Provided: Yes: mailed guide on how to arrange transportation  Follow Up Plan: SW will follow up with patient by phone over the next 2-3 weeks to confirm receipt of mailed resource   Daneen Schick, [place], CDP Social Worker, Certified Dementia Practitioner [place] [facility] [place] [facility] [phone]

## 2018-12-20 ENCOUNTER — Other Ambulatory Visit: Payer: Self-pay

## 2018-12-20 ENCOUNTER — Telehealth: Payer: Self-pay

## 2018-12-20 ENCOUNTER — Ambulatory Visit (INDEPENDENT_AMBULATORY_CARE_PROVIDER_SITE_OTHER): Payer: Medicare HMO

## 2018-12-20 DIAGNOSIS — I10 Essential (primary) hypertension: Secondary | ICD-10-CM

## 2018-12-20 DIAGNOSIS — E785 Hyperlipidemia, unspecified: Secondary | ICD-10-CM

## 2018-12-20 DIAGNOSIS — E119 Type 2 diabetes mellitus without complications: Secondary | ICD-10-CM

## 2018-12-21 ENCOUNTER — Telehealth: Payer: Self-pay | Admitting: Adult Health

## 2018-12-21 ENCOUNTER — Ambulatory Visit: Payer: Self-pay | Admitting: Pharmacist

## 2018-12-21 DIAGNOSIS — I10 Essential (primary) hypertension: Secondary | ICD-10-CM

## 2018-12-21 DIAGNOSIS — E119 Type 2 diabetes mellitus without complications: Secondary | ICD-10-CM

## 2018-12-21 DIAGNOSIS — E785 Hyperlipidemia, unspecified: Secondary | ICD-10-CM

## 2018-12-21 NOTE — Telephone Encounter (Signed)
Mychart declined, no smartphone, consent, pre reg complete 12/21/18 AF

## 2018-12-21 NOTE — Patient Instructions (Signed)
Visit Information  Goals Addressed            This Visit's Progress     Patient Stated   . "I am having trouble affording my new diabetes medicine" (pt-stated)       Current Barriers:  . Financial constraints  . Reported payor restrictions on coverage  Clinical Social Work Clinical Goal(s):  Marland Kitchen Over the next 30 days, patient will work with CCM PharmD to address needs related to medication assistance  Interventions: . Patient interviewed and appropriate assessments performed . Discussed plans with patient for ongoing care management follow up and provided patient with direct contact information for care management team  . Patient currently stable on Xutolphy 30 units daily, however insurance will no longer cover.  She reports FBG range of 90-160.  She realizes 160 is usually due to "not eating as she should", but states this is rare.  Last A1c was 6.8 and patient remains motivated to continue her efforts in controlling A1c. . Will work with [place], Etter Sjogren to apply for financial assistance for [place] through [employer]. . Comprehensive medication review performed.  Patient tolerating medications with no issues. Marland Kitchen Spoke with patient on 12/21/18: she has received application and will come to PCP office ([facility]) tomorrow to drop off information.  PharmD to assist.  Patient Self Care Activities:  . Self administers medications as prescribed . Calls pharmacy for medication refills . Calls provider office for new concerns or questions  Initial goal documentation        The patient verbalized understanding of instructions provided today and declined a print copy of patient instruction materials.   The care management team will reach out to the patient again over the next 14 days.   Regina Eck, PharmD, BCPS Clinical Pharmacist, [facility] [place]: [phone]

## 2018-12-21 NOTE — Chronic Care Management (AMB) (Signed)
Chronic Care Management   Initial Visit Note  12/20/2018 Name: Kimberly Gross MRN: [id number] DOB: 1951/04/18  Referred by: [name], FNP Reason for referral : Chronic Care Management ([place] Telephone Outreach )   Kimberly Gross is a 68 y.o. year old female who is a primary care patient of [name], [place]. The CCM team was consulted for assistance with chronic disease management and care coordination needs.   Review of patient status, including review of consultants reports, relevant laboratory and other test results, and collaboration with appropriate care team members and the patient's provider was performed as part of comprehensive patient evaluation and provision of chronic care management services.     I spoke with Kimberly Gross by telephone today for her initial [place] outreach. RNCM Plan of Care was established.   Objective:  Lab Results  Component Value Date   HGBA1C 6.8 (H) 09/01/2018   HGBA1C 6.0 (H) 05/14/2018   HGBA1C 5.7 01/28/2018   Lab Results  Component Value Date   MICROALBUR 150 09/01/2018   LDLCALC 81 09/01/2018   CREATININE 2.29 (H) 09/01/2018   BP Readings from Last 3 Encounters:  12/08/18 125/84  12/01/18 (!) 142/96  11/04/18 (!) 142/82    Goals Addressed      Patient Stated   . "I check my blood sugar twice a day" (pt-stated)       Current Barriers:  Marland Kitchen Knowledge Deficits related to disease process and Self-health management of Diabetes Mellitus  Nurse Case Manager Clinical Goal(s):  Marland Kitchen Over the next 30 days, patient will verbalize basic understanding of diabetes  disease process and self health management plan as evidenced by patient will verbalize 100% adherence to all ADA recommendations.   . Over the next 90 days, patient will maintain or achieve an A1C level of <7.0.   CCM RN CM Interventions:  Completed initial call with patient on 12/20/18:   . Evaluation of current treatment plan related to diabetes and patient's  adherence to plan as established by provider . Reviewed and discussed patient's current A1C of 6.8; discussed target A1C to maintain <7.0 . Provided education to patient re: ADA recommendations for Meal planning and monitoring preprandial CBG's . Reviewed medications with patient and discussed effectivenss from Xultrophy AEB lowering BG levels to acceptable ranges . Discussed plans with patient for ongoing care management follow up and provided patient with direct contact information for care management team . Provided patient with printed educational materials related to diabetes disease process and recommendations for diet and Meal planning using the plate method, Knowing Your A1C and Signs/Symptoms of Hypo/Hyperglycemia . Reviewed scheduled/upcoming provider appointments including: 3 month follow up planned with Nutritionist, Echocardiogram and Cardiology follow-up; discussed next follow up with provider [name], [place] set for 03/03/19 @ 10:15 am  . Advised patient, providing education and rationale, to check cbg before meals and record, calling CCM RNCM and or provider [name], FNP  for findings outside established parameters - patient reports an average low BG of 120 and an average high BG of 160's since starting Xultrophy  Patient Self Care Activities:  . Self administers medications as prescribed . Attends all scheduled provider appointments . Calls pharmacy for medication refills . Performs ADL's independently . Performs IADL's independently . Calls provider office for new concerns or questions  Initial goal documentation    . "I have an Echocardiogram scheduled soon" (pt-stated)       Current Barriers:  Marland Kitchen Knowledge Deficits related to  diagnosis and treatment management for abnormal EKG findings  Nurse Case Manager Clinical Goal(s):  Marland Kitchen Over the next 30 days, patient will verbalize understanding of plan for treatment management of Cardiac disease and abnormal EKG  . Over the  next 90 days, patient will report having no ER visits and or IP events related to change in Cardiac status  CCM RN CM Interventions:  Completed initial call with patient on 12/20/18:  . Evaluation of current treatment plan related to HTN, Cardiac disease and abnormal EKG findings and patient's adherence to plan as established by provider . Reviewed medications with patient and discussed indication of usage, frequency and dosage for Cardiac and Hypertension conditions . Reiterated importance of taking all medications exactly as prescribed without missing doses, encouraged patient to refill meds when supply is getting low but prior to running out . Reiterated importance of following a low Sodium diet . Discussed plans with patient for ongoing care management follow up and provided patient with direct contact information for care management team . Reviewed scheduled/upcoming provider appointments including: upcoming Echocardiogram and follow-up with Cardiologist NP to review Echo results  Patient Self Care Activities:  . Self administers medications as prescribed . Attends all scheduled provider appointments . Calls pharmacy for medication refills . Performs ADL's independently . Performs IADL's independently . Calls provider office for new concerns or questions  Initial goal documentation    . "I have pain everyday from my arthritis" (pt-stated)       Current Barriers:  Marland Kitchen Knowledge Deficits related to disease process and treatment management of arthritis pain  . Impaired Physical Mobility secondary to having increased arthritis inflammation and joint pain   Nurse Case Manager Clinical Goal(s):  Marland Kitchen Over the next 30 days, patient will verbalize understanding of plan for MD recommendations for arthritis specific labs and or referral to Rheumatology and PT . Over the next 90 days, patient will verbalize having improved physical mobility  CCM RN CM Interventions:  Completed initial call with  patient on 12/20/18:    . Evaluation of current treatment plan related to treatment management for arthritis pain and patient's adherence to plan as established by provider . Provided education to patient re: Osteoarthritis verses Rheumatoid Arthritis . Assessed for home safety concerns and current use of DME - patient denies having any home safety concerns, she is currently using a rollator walker when needed - she denies falls . Reviewed and discussed patient's ability to be active and implement exercise into her daily routine that also includes stretching to help improve ROM, mobility and stamina - patient states currently she feels her arthritis inflammation and "sore muscles" are preventing her from being active and or exercising . Reviewed and discussed possible benefits from working with an in-home PT to help develop a safe home exercise plan to teach patient safe and effective ways to stretch to help improve her ROM, gait and balance and overall stamina and strength - patient is agreeable and would like assistance with coordinating this service . Reviewed medications with patient and discussed indication for usage, frequency and dosage; assessed for adherence; assessed for questions/concerns related prescribed medication regimen; assessed for financial hardship paying for medications; reviewed Xultrophy application status sent by [facility] Etter Sjogren to assist with cost - patient states she has not received as of yet - embedded PharmD Lottie Dawson notified . Collaborated with provider [name], FNP regarding recommendations for labs to rule out autoimmune, in-home PT and possible Rheumatology referral  . Discussed  plans with patient for ongoing care management follow up and provided patient with direct contact information for care management team  Patient Self Care Activities:  . Self administers medications as prescribed . Attends all scheduled provider appointments . Calls pharmacy for  medication refills . Performs ADL's independently . Performs IADL's independently . Calls provider office for new concerns or questions  Initial goal documentation        The care management team will reach out to the patient again over the next 7-14 days.   Barb Merino, RN, BSN, CCM Care Management Coordinator [facility]  Direct Phone: [phone]

## 2018-12-21 NOTE — Progress Notes (Signed)
  Chronic Care Management   Visit Note  12/21/2018 Name: Kimberly Gross MRN: [id number] DOB: 08-10-1950  Referred by: [name], FNP Reason for referral : Chronic Care Management   Kimberly Gross is a 68 y.o. year old female who is a primary care patient of [name], [place]. The CCM team was consulted for assistance with chronic disease management and care coordination needs.   Review of patient status, including review of consultants reports, relevant laboratory and other test results, and collaboration with appropriate care team members and the patient's provider was performed as part of comprehensive patient evaluation and provision of chronic care management services.    I spoke with Kimberly Gross by telephone today.  Objective:   Goals Addressed            This Visit's Progress     Patient Stated   . "I am having trouble affording my new diabetes medicine" (pt-stated)       Current Barriers:  . Financial constraints  . Reported payor restrictions on coverage  Clinical Social Work Clinical Goal(s):  Marland Kitchen Over the next 30 days, patient will work with CCM PharmD to address needs related to medication assistance  Interventions: . Patient interviewed and appropriate assessments performed . Discussed plans with patient for ongoing care management follow up and provided patient with direct contact information for care management team  . Patient currently stable on Xutolphy 30 units daily, however insurance will no longer cover.  She reports FBG range of 90-160.  She realizes 160 is usually due to "not eating as she should", but states this is rare.  Last A1c was 6.8 and patient remains motivated to continue her efforts in controlling A1c. . Will work with [place], Etter Sjogren to apply for financial assistance for [place] through [employer]. . Comprehensive medication review performed.  Patient tolerating medications with no issues. Marland Kitchen Spoke with patient on 12/21/18: she has  received application and will come to PCP office ([facility]) tomorrow to drop off information.  PharmD to assist.  Patient Self Care Activities:  . Self administers medications as prescribed . Calls pharmacy for medication refills . Calls provider office for new concerns or questions  Initial goal documentation        Plan:   The care management team will reach out to the patient again over the next 14 days.   Regina Eck, PharmD, BCPS Clinical Pharmacist, [facility] [place]: [phone]

## 2018-12-21 NOTE — Patient Instructions (Addendum)
Visit Information  Goals Addressed      Patient Stated   . "I check my blood sugar twice a day" (pt-stated)       Current Barriers:  Marland Kitchen Knowledge Deficits related to disease process and Self-health management of Diabetes Mellitus  Nurse Case Manager Clinical Goal(s):  Marland Kitchen Over the next 30 days, patient will verbalize basic understanding of diabetes  disease process and self health management plan as evidenced by patient will verbalize 100% adherence to all ADA recommendations.   . Over the next 90 days, patient will maintain or achieve an A1C level of <7.0.   CCM RN CM Interventions:  Completed initial call with patient on 12/20/18:   . Evaluation of current treatment plan related to diabetes and patient's adherence to plan as established by provider . Reviewed and discussed patient's current A1C of 6.8; discussed target A1C to maintain <7.0 . Provided education to patient re: ADA recommendations for Meal planning and monitoring preprandial CBG's . Reviewed medications with patient and discussed effectivenss from Xultrophy AEB lowering BG levels to acceptable ranges . Discussed plans with patient for ongoing care management follow up and provided patient with direct contact information for care management team . Provided patient with printed educational materials related to diabetes disease process and recommendations for diet and Meal planning using the plate method, Knowing Your A1C and Signs/Symptoms of Hypo/Hyperglycemia . Reviewed scheduled/upcoming provider appointments including: 3 month follow up planned with Nutritionist, Echocardiogram and Cardiology follow-up; discussed next follow up with provider [name], [place] set for 03/03/19 @ 10:15 am  . Advised patient, providing education and rationale, to check cbg before meals and record, calling CCM RNCM and or provider [name], FNP  for findings outside established parameters - patient reports an average low BG of 120 and an average  high BG of 160's since starting Xultrophy  Patient Self Care Activities:  . Self administers medications as prescribed . Attends all scheduled provider appointments . Calls pharmacy for medication refills . Performs ADL's independently . Performs IADL's independently . Calls provider office for new concerns or questions  Initial goal documentation    . "I have an Echocardiogram scheduled soon" (pt-stated)       Current Barriers:  Marland Kitchen Knowledge Deficits related to diagnosis and treatment management for abnormal EKG findings  Nurse Case Manager Clinical Goal(s):  Marland Kitchen Over the next 30 days, patient will verbalize understanding of plan for treatment management of Cardiac disease and abnormal EKG  . Over the next 90 days, patient will report having no ER visits and or IP events related to change in Cardiac status  CCM RN CM Interventions:  Completed initial call with patient on 12/20/18:  . Evaluation of current treatment plan related to HTN, Cardiac disease and abnormal EKG findings and patient's adherence to plan as established by provider . Reviewed medications with patient and discussed indication of usage, frequency and dosage for Cardiac and Hypertension conditions . Reiterated importance of taking all medications exactly as prescribed without missing doses, encouraged patient to refill meds when supply is getting low but prior to running out . Reiterated importance of following a low Sodium diet . Discussed plans with patient for ongoing care management follow up and provided patient with direct contact information for care management team . Reviewed scheduled/upcoming provider appointments including: upcoming Echocardiogram and follow-up with Cardiologist NP to review Echo results  Patient Self Care Activities:  . Self administers medications as prescribed . Attends all scheduled provider appointments .  Calls pharmacy for medication refills . Performs ADL's independently . Performs  IADL's independently . Calls provider office for new concerns or questions  Initial goal documentation     . "I have pain everyday from my arthritis" (pt-stated)       Current Barriers:  Marland Kitchen Knowledge Deficits related to disease process and treatment management of arthritis pain  . Impaired Physical Mobility secondary to having increased arthritis inflammation and joint pain   Nurse Case Manager Clinical Goal(s):  Marland Kitchen Over the next 30 days, patient will verbalize understanding of plan for MD recommendations for arthritis specific labs and or referral to Rheumatology and PT . Over the next 90 days, patient will verbalize having improved physical mobility  CCM RN CM Interventions:  Completed initial call with patient on 12/20/18:    . Evaluation of current treatment plan related to treatment management for arthritis pain and patient's adherence to plan as established by provider . Provided education to patient re: Osteoarthritis verses Rheumatoid Arthritis . Assessed for home safety concerns and current use of DME - patient denies having any home safety concerns, she is currently using a rollator walker when needed - she denies falls . Reviewed and discussed patient's ability to be active and implement exercise into her daily routine that also includes stretching to help improve ROM, mobility and stamina - patient states currently she feels her arthritis inflammation and "sore muscles" are preventing her from being active and or exercising . Reviewed and discussed possible benefits from working with an in-home PT to help develop a safe home exercise plan to teach patient safe and effective ways to stretch to help improve her ROM, gait and balance and overall stamina and strength - patient is agreeable and would like assistance with coordinating this service . Reviewed medications with patient and discussed indication for usage, frequency and dosage; assessed for adherence; assessed for  questions/concerns related prescribed medication regimen; assessed for financial hardship paying for medications; reviewed Xultrophy application status sent by [facility] Etter Sjogren to assist with cost - patient states she has not received as of yet - embedded PharmD Lottie Dawson notified . Collaborated with provider [name], FNP regarding recommendations for labs to rule out autoimmune, in-home PT and possible Rheumatology referral  . Discussed plans with patient for ongoing care management follow up and provided patient with direct contact information for care management team  Patient Self Care Activities:  . Self administers medications as prescribed . Attends all scheduled provider appointments . Calls pharmacy for medication refills . Performs ADL's independently . Performs IADL's independently . Calls provider office for new concerns or questions  Initial goal documentation        The patient verbalized understanding of instructions provided today and declined a print copy of patient instruction materials.   The care management team will reach out to the patient again over the next 7-14 days.    Barb Merino, RN, BSN, CCM Care Management Coordinator [facility] [facility]  Direct Phone: [phone]

## 2018-12-22 ENCOUNTER — Other Ambulatory Visit: Payer: Self-pay | Admitting: Nurse Practitioner

## 2018-12-22 DIAGNOSIS — R29898 Other symptoms and signs involving the musculoskeletal system: Secondary | ICD-10-CM

## 2018-12-23 ENCOUNTER — Telehealth (HOSPITAL_COMMUNITY): Payer: Self-pay | Admitting: *Deleted

## 2018-12-23 NOTE — Telephone Encounter (Signed)

## 2018-12-24 ENCOUNTER — Ambulatory Visit (HOSPITAL_COMMUNITY): Payer: Medicare HMO | Attending: Cardiology

## 2018-12-24 ENCOUNTER — Other Ambulatory Visit: Payer: Self-pay

## 2018-12-24 DIAGNOSIS — R9431 Abnormal electrocardiogram [ECG] [EKG]: Secondary | ICD-10-CM | POA: Insufficient documentation

## 2018-12-24 DIAGNOSIS — R6889 Other general symptoms and signs: Secondary | ICD-10-CM | POA: Diagnosis not present

## 2018-12-26 NOTE — Progress Notes (Signed)
Virtual Visit via Telephone Note   This visit type was conducted due to national recommendations for restrictions regarding the COVID-19 Pandemic (e.g. social distancing) in an effort to limit this patient's exposure and mitigate transmission in our community.  Due to her co-morbid illnesses, this patient is at least at moderate risk for complications without adequate follow up.  This format is felt to be most appropriate for this patient at this time.  The patient did not have access to video technology/had technical difficulties with video requiring transitioning to audio format only (telephone).  All issues noted in this document were discussed and addressed.  No physical exam could be performed with this format.  Please refer to the patient's chart for her  consent to telehealth for Kimberly Gross.   Date:  12/26/2018   ID:  Kimberly Gross, DOB 04/20/1951, MRN [id number]  Patient Location: Home Provider Location: Home  PCP:  [name], FNP  Cardiologist:  Elouise Munroe, MD  Electrophysiologist:  None   Evaluation Performed:  Follow-Up Visit  Chief Complaint:  Hypertension   History of Present Illness:    Kimberly Gross is a 68 y.o. female with known history of hypertension, diabetes, chronic kidney disease stage IV, anxiety.  She was last seen by Dr. Margaretann Loveless on 12/08/2018.  It was noted that the patient has chronic lower extremity edema for which she takes Lasix.  At the time of the last office visit the patient was medically compliant, blood pressures were well controlled.  She was asymptomatic.    A follow-up echocardiogram was planned.  This revealed normal LV systolic function with an EF of 60% to 65%.  There was some moderate calcification on the aortic valve with trivial aortic regurgitation.  She is doing well and is without complaints. BP and HR are doing well on current medication.  She has some LEE, but not as significant as in the past. She is medically compliant.    The patient does not have symptoms concerning for COVID-19 infection (fever, chills, cough, or new shortness of breath).    Past Medical History:  Diagnosis Date  . Anemia   . Anxiety   . Bipolar 1 disorder ([place])   . Depression   . Diabetes mellitus without complication ([place])   . Hyperlipemia   . Hypertension   . Occasional tremors    Past Surgical History:  Procedure Laterality Date  . VAGINAL HYSTERECTOMY       No outpatient medications have been marked as taking for the 12/27/18 encounter (Appointment) with Lendon Colonel, NP.     Allergies:   Chlorpromazine   Social History   Tobacco Use  . Smoking status: Never Smoker  . Smokeless tobacco: Never Used  Substance Use Topics  . Alcohol use: No  . Drug use: No     Family Hx: The patient's family history includes Heart attack in her father; Hypertension in her mother.  ROS:   Please see the history of present illness.    All other systems reviewed and are negative.   Prior CV studies:   The following studies were reviewed today:  Echocardiogram 12/24/2018 1. The left ventricle has normal systolic function with an ejection fraction of 60-65%. The cavity size was normal. There is moderately increased left ventricular wall thickness. Left ventricular diastolic Doppler parameters are consistent with impaired  relaxation. No evidence of left ventricular regional wall motion abnormalities.  2. No evidence of mitral valve stenosis. No significant mitral regurgitation.  3. The aortic valve is tricuspid. Moderate calcification of the aortic valve. Aortic valve regurgitation is trivial by color flow Doppler. Mild stenosis of the aortic valve. Mean gradient 11 mmHg, AVA 1.7 cm^2.  4. The aortic root and ascending aorta are normal in size and structure.  5. Left atrial size was mildly dilated.  6. The right ventricle has normal systolic function. The cavity was normal. There is no increase in right ventricular wall  thickness.  7. The IVC was normal in size. PA systolic pressure 27 mmHg.  Labs/Other Tests and Data Reviewed:    EKG:  No ECG reviewed.  Recent Labs: 09/01/2018: ALT 27; BUN 42; Creatinine, Ser 2.29; Potassium 4.6; Sodium 142; TSH 1.610   Recent Lipid Panel Lab Results  Component Value Date/Time   CHOL 183 09/01/2018 03:49 PM   TRIG 266 (H) 09/01/2018 03:49 PM   HDL 49 09/01/2018 03:49 PM   CHOLHDL 3.7 09/01/2018 03:49 PM   CHOLHDL 2.7 08/23/2010 10:38 PM   LDLCALC 81 09/01/2018 03:49 PM    Wt Readings from Last 3 Encounters:  12/08/18 260 lb (117.9 kg)  12/01/18 256 lb (116.1 kg)  11/04/18 268 lb (121.6 kg)     Objective:    Vital Signs:  There were no vitals taken for this visit.   VITAL SIGNS:  reviewed GEN:  no acute distress NEURO:  alert and oriented x 3, no obvious focal deficit PSYCH:  normal affect  ASSESSMENT & PLAN:    1. Hypertension: BP is controlled currently but not optimal for diabetic patient.  She is advised to continue medication regimen, and reduce salt in her diet. She is to keep track of BP for trends. May need to add low dose chlorthalidone if BP remains above 140 mmHg consistently.   2. Chronic LEE: I have explained that some of the LEE may be related to amlodipine. She is to reduce salt in her diet and consider wearing support hose for comfort and venous return. She verbalizes understanding.   3. Mixed hyperlipidemia; Followed by PCP. She is on both a statin and fenofibrate. Will need to watch pancreatic enzymes with this combination.   4. Diabetes; Followed by PCP.     COVID-19 Education: The signs and symptoms of COVID-19 were discussed with the patient and how to seek care for testing (follow up with PCP or arrange E-visit).  The importance of social distancing was discussed today.  Time:   Today, I have spent 10 minutes with the patient with telehealth technology discussing the above problems.     Medication Adjustments/Labs and Tests  Ordered: Current medicines are reviewed at length with the patient today.  Concerns regarding medicines are outlined above.   Tests Ordered: No orders of the defined types were placed in this encounter.   Medication Changes: No orders of the defined types were placed in this encounter.   Disposition:  Follow up 6 months with Dr.Acharya.   Signed, Phill Myron. [place] Pugh, ANP, AACC  12/26/2018 2:54 PM    [facility]

## 2018-12-27 ENCOUNTER — Telehealth (INDEPENDENT_AMBULATORY_CARE_PROVIDER_SITE_OTHER): Payer: Medicare HMO | Admitting: Adult Health

## 2018-12-27 ENCOUNTER — Other Ambulatory Visit: Payer: Self-pay | Admitting: Pharmacy Technician

## 2018-12-27 ENCOUNTER — Encounter: Payer: Self-pay | Admitting: Adult Health

## 2018-12-27 VITALS — BP 145/89 | HR 89 | Ht 64.0 in | Wt 262.0 lb

## 2018-12-27 DIAGNOSIS — E785 Hyperlipidemia, unspecified: Secondary | ICD-10-CM

## 2018-12-27 DIAGNOSIS — I10 Essential (primary) hypertension: Secondary | ICD-10-CM | POA: Diagnosis not present

## 2018-12-27 DIAGNOSIS — E119 Type 2 diabetes mellitus without complications: Secondary | ICD-10-CM

## 2018-12-27 DIAGNOSIS — G4733 Obstructive sleep apnea (adult) (pediatric): Secondary | ICD-10-CM

## 2018-12-27 NOTE — Patient Outreach (Signed)
[facility]) Care Management  12/27/2018  TAWNEY VANORMAN 1951/03/08 [id number]   Received all required documents for application. Faxed completed application and required documents for Xultophy into [employer].  Will follow up with company in 3-5 business days to check status of application.  Maud Deed Chana Bode [address] Certified Pharmacy Technician [place] Management Direct [phone]

## 2018-12-27 NOTE — Patient Instructions (Addendum)
Follow-Up: You will need a follow up appointment in 6 months.  Please call our office 2 months in advance, October 2020 to schedule this appointment.  You may see Elouise Munroe, MD  Jory Sims, DNP, AACC  or one of the following Advanced Practice Providers on your designated Care Team:  Rosaria Ferries, PA-C  Jory Sims, DNP, ANP      Medication Instructions:  NO CHANGES- Your physician recommends that you continue on your current medications as directed. Please refer to the Current Medication list given to you today. If you need a refill on your cardiac medications before your next appointment, please call your pharmacy. Labwork: When you have labs (blood work) and your tests are completely normal, you will receive your results ONLY by [place] (if you have MyChart) -OR- A paper copy in the mail.  At [facility], you and your health needs are our priority.  As part of our continuing mission to provide you with exceptional heart care, we have created designated Provider Care Teams.  These Care Teams include your primary Cardiologist (physician) and Advanced Practice Providers (APPs -  Physician Assistants and Nurse Practitioners) who all work together to provide you with the care you need, when you need it.  Thank you for choosing [facility] at [facility]!!

## 2018-12-30 ENCOUNTER — Telehealth: Payer: Self-pay

## 2018-12-30 ENCOUNTER — Other Ambulatory Visit: Payer: Self-pay | Admitting: Nurse Practitioner

## 2018-12-31 ENCOUNTER — Ambulatory Visit: Payer: Self-pay | Admitting: Pharmacist

## 2018-12-31 NOTE — Progress Notes (Signed)
  Chronic Care Management   Outreach Note  12/31/2018 Name: Kimberly Gross MRN: [id number] DOB: 1951-06-04  Referred by: [name], FNP Reason for referral : Chronic Care Management   An unsuccessful telephone outreach was attempted today.  HIPAA compliant message was left with call back information provided.  The patient was referred to the case management team by for assistance with chronic care management and care coordination.   Follow Up Plan: The care management team will reach out to the patient again over the next 3-5 business days.   Regina Eck, PharmD, BCPS Clinical Pharmacist, [facility] [place]: [phone]

## 2019-01-03 ENCOUNTER — Telehealth: Payer: Self-pay

## 2019-01-04 ENCOUNTER — Telehealth: Payer: Self-pay

## 2019-01-04 ENCOUNTER — Ambulatory Visit: Payer: Self-pay

## 2019-01-04 DIAGNOSIS — I10 Essential (primary) hypertension: Secondary | ICD-10-CM

## 2019-01-04 DIAGNOSIS — E119 Type 2 diabetes mellitus without complications: Secondary | ICD-10-CM

## 2019-01-04 NOTE — Chronic Care Management (AMB) (Signed)
  Chronic Care Management   Social Work Note  01/04/2019 Name: JISSEL SLAVENS MRN: [id number] DOB: 1951/03/31  SEILA LISTON is a 68 y.o. year old female who sees [name], [place] for primary care. The CCM team was consulted for assistance with Transportation Resources.   Unsuccessful outbound call to confirm receipt of mailed resources. CCM SW left a HIPAA compliant voice message requesting a return call.  Follow Up Plan: SW will follow up with patient by phone over the next 7-10 days.  Daneen Schick, BSW, CDP Social Worker, Certified Dementia Practitioner [place] / [place] Management [phone]  Total time spent performing care coordination and/or care management activities with the patient by phone or face to face = 3 minutes.

## 2019-01-05 ENCOUNTER — Other Ambulatory Visit: Payer: Self-pay | Admitting: Pharmacy Technician

## 2019-01-05 ENCOUNTER — Telehealth: Payer: Self-pay

## 2019-01-05 NOTE — Patient Outreach (Signed)
[facility]) Care Management  01/05/2019  DONTASIA MIRANDA 04/25/1951 [id number]    Follow up call placed to [employer] regarding patient assistance application(s) for Netty Starring confirms patient has been approved as of 6/24 until 07/14/19. Medication to arrive at providers office in 10-14 business days.  Follow up:  Will route note to [place] to inform.  Maud Deed Chana Bode [place] Certified Pharmacy Technician [place] Management Direct Dial:[phone]

## 2019-01-06 ENCOUNTER — Ambulatory Visit: Payer: Self-pay

## 2019-01-06 ENCOUNTER — Telehealth: Payer: Self-pay

## 2019-01-06 DIAGNOSIS — E119 Type 2 diabetes mellitus without complications: Secondary | ICD-10-CM

## 2019-01-06 DIAGNOSIS — E785 Hyperlipidemia, unspecified: Secondary | ICD-10-CM | POA: Diagnosis not present

## 2019-01-06 DIAGNOSIS — I10 Essential (primary) hypertension: Secondary | ICD-10-CM

## 2019-01-06 NOTE — Chronic Care Management (AMB) (Signed)
  Chronic Care Management   Outreach Note  01/06/2019 Name: Kimberly Gross MRN: [id number] DOB: 12/28/1950  Referred by: [name], FNP Reason for referral : Chronic Care Management (CCM RNCM Telephone Follow up)   An unsuccessful telephone outreach was attempted today. The patient was referred to the case management team by [name], FNP for assistance with chronic care management and care coordination.   Follow Up Plan: The care management team will reach out to the patient again over the next 3-5 days.     Barb Merino, RN, BSN, CCM Care Management Coordinator [place] Management/[facility]  Direct Phone: [phone]

## 2019-01-07 ENCOUNTER — Telehealth: Payer: Self-pay

## 2019-01-11 ENCOUNTER — Telehealth: Payer: Self-pay

## 2019-01-12 ENCOUNTER — Ambulatory Visit: Payer: Self-pay

## 2019-01-12 ENCOUNTER — Telehealth: Payer: Self-pay

## 2019-01-12 DIAGNOSIS — E119 Type 2 diabetes mellitus without complications: Secondary | ICD-10-CM

## 2019-01-12 DIAGNOSIS — I10 Essential (primary) hypertension: Secondary | ICD-10-CM

## 2019-01-12 DIAGNOSIS — E785 Hyperlipidemia, unspecified: Secondary | ICD-10-CM

## 2019-01-12 NOTE — Chronic Care Management (AMB) (Signed)
  Chronic Care Management   Outreach Note  01/12/2019 Name: Kimberly Gross MRN: [id number] DOB: Feb 11, 1951  Referred by: [name], FNP Reason for referral : Care Coordination   A second unsuccessful telephone outreach was attempted today in efforts to confirm receipt of mailed resource. CCM SW left a HIPAA compliant voice message requesting a return call.    Follow Up Plan: The care management team will reach out to the patient again over the next 10 days.   Daneen Schick, BSW, CDP Social Worker, Certified Dementia Practitioner [place] / [place] Management [phone]  Total time spent performing care coordination and/or care management activities with the patient by phone or face to face = 5 minutes.

## 2019-01-12 NOTE — Chronic Care Management (AMB) (Signed)
  Chronic Care Management   Outreach Note  01/12/2019 Name: Kimberly Gross MRN: [id number] DOB: 01-06-1951  Referred by: [name], FNP Reason for referral : Chronic Care Management (CCM RNCM Telephone Follow up)   A second unsuccessful telephone outreach was attempted today. The patient was referred to the case management team for assistance with chronic care management and care coordination.   Follow Up Plan: A HIPPA compliant phone message was left for the patient providing contact information and requesting a return call.  The care management team will reach out to the patient again over the next 3-5 days.     Barb Merino, RN, BSN, CCM Care Management Coordinator [place] Management/[facility]  Direct Phone: [phone]

## 2019-01-17 ENCOUNTER — Telehealth: Payer: Self-pay

## 2019-01-18 ENCOUNTER — Ambulatory Visit: Payer: Self-pay

## 2019-01-18 DIAGNOSIS — E119 Type 2 diabetes mellitus without complications: Secondary | ICD-10-CM

## 2019-01-18 DIAGNOSIS — E785 Hyperlipidemia, unspecified: Secondary | ICD-10-CM

## 2019-01-18 DIAGNOSIS — I10 Essential (primary) hypertension: Secondary | ICD-10-CM

## 2019-01-18 NOTE — Chronic Care Management (AMB) (Signed)
  Chronic Care Management   Outreach Note  01/18/2019 Name: Kimberly Gross MRN: [id number] DOB: 1951-03-11  Referred by: [name], FNP Reason for referral : Chronic Care Management (CCM RNCM Telephone Follow up )   Third unsuccessful telephone outreach was attempted today. Attempted to reach patient on all listed phone numbers without success.The patient was referred to the case management team for assistance with chronic care management and care coordination. The patient's primary care provider has been notified of our unsuccessful attempts to make or maintain contact with the patient. The care management team is pleased to engage with this patient at any time in the future should he/she be interested in assistance from the care management team.   Follow Up Plan: A HIPPA compliant phone message was left with the patient's daughter Kimberly Gross; provided my contact information and requested for the patient to return my call. The care management team is available to follow up with the patient after provider conversation with the patient regarding recommendation for care management engagement and subsequent re-referral to the care management team.    Barb Merino, RN, BSN, CCM Care Management Coordinator [place] Management/[facility]  Direct Phone: [phone]

## 2019-01-20 ENCOUNTER — Telehealth: Payer: Self-pay

## 2019-01-21 ENCOUNTER — Other Ambulatory Visit: Payer: Self-pay | Admitting: Nurse Practitioner

## 2019-01-21 DIAGNOSIS — Z1231 Encounter for screening mammogram for malignant neoplasm of breast: Secondary | ICD-10-CM

## 2019-01-26 ENCOUNTER — Encounter: Payer: Medicare HMO | Attending: Internal Medicine | Admitting: *Deleted

## 2019-01-26 ENCOUNTER — Other Ambulatory Visit: Payer: Self-pay

## 2019-01-26 DIAGNOSIS — E119 Type 2 diabetes mellitus without complications: Secondary | ICD-10-CM | POA: Insufficient documentation

## 2019-01-26 NOTE — Progress Notes (Signed)
Diabetes Self-Management Education  Visit Type:     Appt. Start Time: 0805 Appt. End Time: 0830  01/26/2019  [name], identified by name and date of birth, is a 68 y.o. female with a diagnosis of Diabetes:  Patient A1c is still within target range. She states she is testing her BG every AM and they typically run between 99 - 120 with occasionally excursions to about 150 mg/dl. She states she is very busy at her home so hasn't been exercising like she was. She also continues to eat plenty of vegetables with her meals and is drinking lots of water. She reports she has gotten coverage for the cost of her diabetes medication Xultophy until November! She is no longer on any other diabetes medication.   ASSESSMENT  There were no vitals taken for this visit. There is no height or weight on file to calculate BMI.   Learning Objective:  Patient will have a greater understanding of diabetes self-management. Patient education plan is to attend individual and/or group sessions per assessed needs and concerns.  Plan:   Patient Instructions  Plan: continue to  Aim for 2 Carb Choices per meal (30 grams) +/- 1 either way   Aim for 0-1 Carbs per snack if hungry   Enjoy your vegetables!  Include protein in moderation with your meals and snacks  Continue reducing your salt intake to help with water retention and blood pressure  Consider doing Arm Chair exercises for 15-30 minutes daily as well as Zumba on Tuesday and Thursday as tolerated on days you are less active around the house  Continue checking BG once a day in AM  Continue taking medication as prescribed. So glad you have been able to get it covered until November!  Expected Outcomes:   Continued control of BG and potential weight loss  Education material provided: no new handouts today other than [place] for positive behaviors towards good BG control  If problems or questions, patient to contact team via:  Phone  Future  DSME appointment: -   02/25/2019

## 2019-01-26 NOTE — Patient Instructions (Signed)
Plan: continue to  Aim for 2 Carb Choices per meal (30 grams) +/- 1 either way   Aim for 0-1 Carbs per snack if hungry   Enjoy your vegetables!  Include protein in moderation with your meals and snacks  Continue reducing your salt intake to help with water retention and blood pressure  Consider doing Arm Chair exercises for 15-30 minutes daily as well as Zumba on Tuesday and Thursday as tolerated on days you are less active around the house  Continue checking BG once a day in AM  Continue taking medication as prescribed. So glad you have been able to get it covered until November!

## 2019-01-31 ENCOUNTER — Telehealth: Payer: Self-pay

## 2019-01-31 ENCOUNTER — Ambulatory Visit: Payer: Self-pay

## 2019-01-31 DIAGNOSIS — I10 Essential (primary) hypertension: Secondary | ICD-10-CM

## 2019-01-31 DIAGNOSIS — E785 Hyperlipidemia, unspecified: Secondary | ICD-10-CM

## 2019-01-31 DIAGNOSIS — E119 Type 2 diabetes mellitus without complications: Secondary | ICD-10-CM

## 2019-01-31 NOTE — Chronic Care Management (AMB) (Signed)
Chronic Care Management   Follow Up Note   01/31/2019 Name: Kimberly Gross MRN: [id number] DOB: 1950-07-15  Referred by: [name], FNP Reason for referral : Chronic Care Management (CCM RNCM Telephone Follow up )   Kimberly Gross is a 68 y.o. year old female who is a primary care patient of [name], [place]. The CCM team was consulted for assistance with chronic disease management and care coordination needs.    Review of patient status, including review of consultants reports, relevant laboratory and other test results, and collaboration with appropriate care team members and the patient's provider was performed as part of comprehensive patient evaluation and provision of chronic care management services.    Final unsuccessful telephone attempts was completed today. Attempts were made to reach Kimberly Gross, her spouse [name] and her daughter [name] listed as an approved person to speak with. A letter was mailed to the patient's home requesting she contact the CCM team to discuss her CCM enrollment status.   Goals Addressed      Patient Stated   . COMPLETED: "I check my blood sugar twice a day" (pt-stated)       Current Barriers:  Marland Kitchen Knowledge Deficits related to disease process and Self-health management of Diabetes Mellitus  Nurse Case Manager Clinical Goal(s):  Marland Kitchen Over the next 30 days, patient will verbalize basic understanding of diabetes  disease process and self health management plan as evidenced by patient will verbalize 100% adherence to all ADA recommendations.   Unknown . Over the next 90 days, patient will maintain or achieve an A1C level of <7.0. Unknown  CCM RN CM Interventions:  01/31/19 outbound call attempted to patient to f/u on her progress towards meeting her CCM RNCM goals . Unable to reach patient after multiple attempts to f/u on her goal outcomes  Patient Self Care Activities:  . Self administers medications as prescribed . Attends all  scheduled provider appointments . Calls pharmacy for medication refills . Performs ADL's independently . Performs IADL's independently . Calls provider office for new concerns or questions  Initial goal documentation    . COMPLETED: "I have an Echocardiogram scheduled soon" (pt-stated)       Current Barriers:  Marland Kitchen Knowledge Deficits related to diagnosis and treatment management for abnormal EKG findings  Nurse Case Manager Clinical Goal(s):  Marland Kitchen Over the next 30 days, patient will verbalize understanding of plan for treatment management of Cardiac disease and abnormal EKG  Unknown . Over the next 90 days, patient will report having no ER visits and or IP events related to change in Cardiac status - Unknown  CCM RN CM Interventions:  01/31/19 outbound call attempted to patient to f/u on progress toward CCM RNCM goals . Unable to reach patient after multiple attempts to assess goal outcomes  Patient Self Care Activities:  . Self administers medications as prescribed . Attends all scheduled provider appointments . Calls pharmacy for medication refills . Performs ADL's independently . Performs IADL's independently . Calls provider office for new concerns or questions  Initial goal documentation     . COMPLETED: "I have pain everyday from my arthritis" (pt-stated)       Current Barriers:  Marland Kitchen Knowledge Deficits related to disease process and treatment management of arthritis pain   . Impaired Physical Mobility secondary to having increased arthritis inflammation and joint pain   Nurse Case Manager Clinical Goal(s):  Marland Kitchen Over the next 30 days, patient will verbalize understanding of plan for  MD recommendations for arthritis specific labs and or referral to Rheumatology and PT - Unknown . Over the next 90 days, patient will verbalize having improved physical mobility - Unknown  CCM RN CM Interventions:  01/31/19 outbound call attempted to patient to f/u on progress toward CCM RNCM goals .  Unable to reach patient after multiple attempts to assess goal outcomes   Patient Self Care Activities:  . Self administers medications as prescribed . Attends all scheduled provider appointments . Calls pharmacy for medication refills . Performs ADL's independently . Performs IADL's independently . Calls provider office for new concerns or questions  Initial goal documentation       Other   . COMPLETED: Assist with Chronic Disease Management and Care Coordination Needs       Current Barriers:  Marland Kitchen Knowledge Barriers related to resources and support available to address needs related to Chronic disease management and Community Resources  Case Manager Clinical Goal(s):  Marland Kitchen Over the next 30 days, patient will work with the CCM team to address needs related to Chronic disease management, Medication Management and Community Resources - Unknown  CCM RN CM Interventions:  01/31/19 outbound call attempted to patient to f/u on progress toward CCM RNCM goals . Unable to reach patient after multiple attempts to assess goal outcomes   Patient Self Care Activities:  . Attends all scheduled provider appointments . Calls pharmacy for medication refills . Calls provider office for new concerns or questions  Initial goal documentation         No further follow up required: referring provider [name], FNP has been notified   Barb Merino, RN, BSN, CCM Care Management Coordinator [place] Management/[facility]  Direct Phone: [phone]

## 2019-02-01 ENCOUNTER — Ambulatory Visit: Payer: Self-pay | Admitting: Pharmacist

## 2019-02-01 NOTE — Progress Notes (Signed)
  Chronic Care Management   Outreach Note  02/01/2019 Name: Kimberly Gross MRN: [id number] DOB: October 06, 1950  Referred by: [name], FNP Reason for referral : Chronic Care Management (CCM PharmD Telephone Follow Up)  Kimberly Gross is a 68 y.o. year old female who is a primary care patient of [name], [place]. The CCM team was consulted for assistance with chronic disease management and care coordination needs.    Review of patient status, including review of consultants reports, relevant laboratory and other test results, and collaboration with appropriate care team members and the patient's provider was performed as part of comprehensive patient evaluation and provision of chronic care management services.  Attempts were made to reach Mrs. Wragg, her spouse Kimberly Gross and her daughter Kimberly Gross listed as an approved person to speak with. A letter was mailed to the patient's home by [facility] RN CM requesting she contact the CCM team to discuss her CCM enrollment status.   Objective:   Goals Addressed            This Visit's Progress     Patient Stated   . COMPLETED: "I am having trouble affording my new diabetes medicine" (pt-stated)       Current Barriers:  . Financial constraints  . Reported payor restrictions on coverage  Clinical Social Work Clinical Goal(s):  Marland Kitchen Over the next 30 days, patient will work with CCM PharmD to address needs related to medication assistance (completed)  Interventions: . Patient interviewed and appropriate assessments performed . Discussed plans with patient for ongoing care management follow up and provided patient with direct contact information for care management team  . Patient currently stable on Xutolphy 30 units daily, however insurance will no longer cover.  She reports FBG range of 90-160.  She realizes 160 is usually due to "not eating as she should", but states this is rare.  Last A1c was 6.8 and patient remains motivated to  continue her efforts in controlling A1c. . Will work with [place], Etter Sjogren to apply for financial assistance for [place] through [employer]. . Comprehensive medication review performed.  Patient tolerating medications with no issues. . Follow up call placed to [employer] by [facility] CPhT, Etter Sjogren regarding patient assistance application(s) for Netty Starring confirms patient has been approved as of 6/24 until 07/14/19. Medication to arrive at providers office in 10-14 business days. . Noted patient has had recent dietician visit on 01/26/19 that was successful.  Patient Self Care Activities:  . Self administers medications as prescribed . Calls pharmacy for medication refills . Calls provider office for new concerns or questions  Please see past updates related to this goal by clicking on the "Past Updates" button in the selected goal            No further follow up required: referring provider [name], FNP has been notified   Regina Eck, PharmD, [place] Pharmacist, [facility] [place]: [phone]

## 2019-02-13 NOTE — Progress Notes (Signed)
I agree with the assessment and plan as directed by NP on this visit . I was available for consultation.   Kristine Chahal, MD  

## 2019-02-14 ENCOUNTER — Ambulatory Visit: Payer: Self-pay

## 2019-02-14 DIAGNOSIS — R29898 Other symptoms and signs involving the musculoskeletal system: Secondary | ICD-10-CM

## 2019-02-14 DIAGNOSIS — E785 Hyperlipidemia, unspecified: Secondary | ICD-10-CM

## 2019-02-14 DIAGNOSIS — E119 Type 2 diabetes mellitus without complications: Secondary | ICD-10-CM

## 2019-02-14 DIAGNOSIS — I10 Essential (primary) hypertension: Secondary | ICD-10-CM

## 2019-02-14 NOTE — Patient Instructions (Signed)
Visit Information  Goals Addressed      Patient Stated   . "I check my blood sugar twice a day" (pt-stated)   On track     Current Barriers:   Knowledge Deficits related to disease process and Self-health management of Diabetes Mellitus   Nurse Case Manager Clinical Goal(s):   Over the next 30 days, patient will verbalize basic understanding of diabetes  disease process and self health management plan as evidenced by patient will verbalize 100% adherence to all ADA recommendations.  Goal Met  Over the next 90 days, patient will maintain or achieve an A1C level of <7.0.    CCM RN CM Interventions:  02/14/19 completed call with patient    Evaluation of current treatment plan related to diabetes and patient's adherence to plan as established by provider  Reviewed and discussed patient's current A1C of 6.8; discussed target A1C to maintain <7.0; discussed fasting blood work may be ordered including an A1C at next scheduled visit set for 03/03/19  Assessed for adherence to following ADA recommendations for Meal planning and monitoring preprandial CBG's - patient reports 100% adherence  Reviewed medications with patient and discussed effectivenss from Xultrophy AEB lowering BG levels to acceptable ranges - patient reports her BG average is between 95-160's  Discussed plans with patient for ongoing care management follow up and provided patient with direct contact information for care management team  Reviewed scheduled/upcoming provider appointments includiing; discussed next follow up with provider [name], [place] set for 03/03/19 @ 10:15 am and scheduled bilateral 3D mammography set for 03/09/19 @ 8:40 AM  Reinforced importance to patient, providing education and rationale, to check cbg before meals and record, calling CCM RNCM and or provider [name], FNP  for findings outside established parameters    Patient Self Care Activities:   Self administers medications as  prescribed  Attends all scheduled provider appointments  Calls pharmacy for medication refills  Performs ADL's independently  Performs IADL's independently  Calls provider office for new concerns or questions    Please see past updates related to this goal by clicking on the "Past Updates" button in the selected goal        . COMPLETED: "I have an  "I have an Echocardiogram scheduled soon" Echocardiogram scheduled soon" (pt-stated)        Current Barriers:   Knowledge Deficits related to diagnosis and treatment management for abnormal EKG findings   Nurse Case Manager Clinical Goal(s):   Copied from previous goal initiated: Over the next 30 days, patient will verbalize understanding of plan for treatment management of Cardiac disease and abnormal EKG - Goal Met   Copied from previous goal initiated: Over the next 90 days, patient will report having no ER visits and or IP events related to change in Cardiac status - Goal Met    CCM RN CM Interventions:  02/14/19 call completed with patient     Evaluation of current treatment plan related to HTN, Cardiac disease and abnormal EKG findings and patient's adherence to plan as established by provider  Confirmed patient completed her Cardiac NP follow up with Jory Sims on 12/27/18  Confirmed patient completed her scheduled Echocardiogram; Reviewed the following update; "This revealed normal LV systolic function with an EF of 60% to 65%.  There was some moderate calcification on the aortic valve with trivial aortic regurgitation"   Encouraged patient to continue adherence to her low salt, heart healthy diet   Patient is aware she should follow up  with Cardiology in October   Assessed for ED visits and or IP events in the past 6 months and patient denies having any such activity   Patient Self Care Activities:   Self administers medications as prescribed  Attends all scheduled provider appointments  Calls pharmacy for  medication refills  Performs ADL's independently  Performs IADL's independently  Calls provider office for new concerns or questions   Initial goal documentation        . "I have pain everyday from my arthritis" (pt-stated)   Not on track     Current Barriers:   Knowledge Deficits related to disease process and treatment management of arthritis pain   Impaired Physical Mobility secondary to having increased arthritis inflammation and joint pain    Nurse Case Manager Clinical Goal(s):   Over the next 30 days, patient will verbalize understanding of plan for MD recommendations for arthritis specific labs and or referral to Rheumatology and PT   Over the next 90 days, patient will verbalize having improved physical mobility   CCM RN CM Interventions:  02/14/19 completed call with patient      Evaluation of current treatment plan related to treatment management for arthritis pain and patient's adherence to plan as established by provider  Advised patient in home PT was previously ordered; discussed the [facility] was unable to reach her by phone to schedule her intake visit - patient has new contact numbers due to changing providers  Discussed request to reorder in PT/OT to assist with balance, strenghthening and to instruct on safe home exercises to improve overall stamina  Assessed for home safety concerns and current use of DME - patient denies having any home safety concerns, she is currently using a rollator walker when needed - she denies falls  Collaborated with provider [name], FNP regarding recommendations for labs to rule out autoimmune, in-home PT and possible Rheumatology referral   Discussed plans with patient for ongoing care management follow up and provided patient with direct contact information for care management team   Patient Self Care Activities:   Self administers medications as prescribed  Attends all scheduled provider appointments  Calls pharmacy  for medication refills  Performs ADL's independently  Performs IADL's independently  Calls provider office for new concerns or questions    Please see past updates related to this goal by clicking on the "Past Updates" button in the selected goal                    The patient verbalized understanding of instructions provided today and declined a print copy of patient instruction materials.   Telephone follow up appointment with care management team member scheduled for: 03/04/19  Barb Merino, RN, BSN, CCM Care Management Coordinator [place] [facility]  Direct Phone: [phone]

## 2019-02-14 NOTE — Chronic Care Management (AMB) (Signed)
Chronic Care Management   Follow Up Note   02/14/2019 Name: Kimberly Gross MRN: [id number] DOB: 1951-02-21  Referred by: [name], FNP Reason for referral : Chronic Care Management (CCM RNCM Telephone Follow up )   Kimberly Gross is a 68 y.o. year old female who is a primary care patient of [name], [place]. The CCM team was consulted for assistance with chronic disease management and care coordination needs.    Review of patient status, including review of consultants reports, relevant laboratory and other test results, and collaboration with appropriate care team members and the patient's provider was performed as part of comprehensive patient evaluation and provision of chronic care management services.    I spoke with Kimberly Gross by telephone today for a CCM follow up on her DM and arthralgia.   Goals Addressed      Patient Stated   . "I check my blood sugar twice a day" (pt-stated)   On track     Current Barriers:   Knowledge Deficits related to disease process and Self-health management of Diabetes Mellitus   Nurse Case Manager Clinical Goal(s):   Over the next 30 days, patient will verbalize basic understanding of diabetes  disease process and self health management plan as evidenced by patient will verbalize 100% adherence to all ADA recommendations.  Goal Met  Over the next 90 days, patient will maintain or achieve an A1C level of <7.0.    CCM RN CM Interventions:  02/14/19 completed call with patient    Evaluation of current treatment plan related to diabetes and patient's adherence to plan as established by provider  Reviewed and discussed patient's current A1C of 6.8; discussed target A1C to maintain <7.0; discussed fasting blood work may be ordered including an A1C at next scheduled visit set for 03/03/19  Assessed for adherence to following ADA recommendations for Meal planning and monitoring preprandial CBG's - patient reports 100% adherence  Reviewed  medications with patient and discussed effectivenss from Xultrophy AEB lowering BG levels to acceptable ranges - patient reports her BG average is between 95-160's  Discussed plans with patient for ongoing care management follow up and provided patient with direct contact information for care management team  Reviewed scheduled/upcoming provider appointments includiing; discussed next follow up with provider [name], [place] set for 03/03/19 @ 10:15 am and scheduled bilateral 3D mammography set for 03/09/19 @ 8:40 AM  Reinforced importance to patient, providing education and rationale, to check cbg before meals and record, calling CCM RNCM and or provider [name], FNP  for findings outside established parameters    Patient Self Care Activities:   Self administers medications as prescribed  Attends all scheduled provider appointments  Calls pharmacy for medication refills  Performs ADL's independently  Performs IADL's independently  Calls provider office for new concerns or questions    Please see past updates related to this goal by clicking on the "Past Updates" button in the selected goal        . COMPLETED: "I have an  "I have an Echocardiogram scheduled soon" Echocardiogram scheduled soon" (pt-stated)        Current Barriers:   Knowledge Deficits related to diagnosis and treatment management for abnormal EKG findings   Nurse Case Manager Clinical Goal(s):   Copied from previous goal initiated: Over the next 30 days, patient will verbalize understanding of plan for treatment management of Cardiac disease and abnormal EKG - Goal Met   Copied from previous goal initiated: Over  the next 90 days, patient will report having no ER visits and or IP events related to change in Cardiac status - Goal Met    CCM RN CM Interventions:  02/14/19 call completed with patient     Evaluation of current treatment plan related to HTN, Cardiac disease and abnormal EKG findings and  patient's adherence to plan as established by provider  Confirmed patient completed her Cardiac NP follow up with Jory Sims on 12/27/18  Confirmed patient completed her scheduled Echocardiogram; Reviewed the following update; "This revealed normal LV systolic function with an EF of 60% to 65%.  There was some moderate calcification on the aortic valve with trivial aortic regurgitation"   Encouraged patient to continue adherence to her low salt, heart healthy diet   Patient is aware she should follow up with Cardiology in October   Assessed for ED visits and or IP events in the past 6 months and patient denies having any such activity   Patient Self Care Activities:   Self administers medications as prescribed  Attends all scheduled provider appointments  Calls pharmacy for medication refills  Performs ADL's independently  Performs IADL's independently  Calls provider office for new concerns or questions   Initial goal documentation          . "I have pain everyday from my arthritis" (pt-stated)   Not on track     Current Barriers:   Knowledge Deficits related to disease process and treatment management of arthritis pain   Impaired Physical Mobility secondary to having increased arthritis inflammation and joint pain    Nurse Case Manager Clinical Goal(s):   Over the next 30 days, patient will verbalize understanding of plan for MD recommendations for arthritis specific labs and or referral to Rheumatology and PT   Over the next 90 days, patient will verbalize having improved physical mobility   CCM RN CM Interventions:  02/14/19 completed call with patient      Evaluation of current treatment plan related to treatment management for arthritis pain and patient's adherence to plan as established by provider  Advised patient in home PT was previously ordered; discussed the [facility] was unable to reach her by phone to schedule her intake visit - patient has new contact  numbers due to changing providers  Discussed request to reorder in PT/OT to assist with balance, strenghthening and to instruct on safe home exercises to improve overall stamina  Assessed for home safety concerns and current use of DME - patient denies having any home safety concerns, she is currently using a rollator walker when needed - she denies falls  Collaborated with provider [name], FNP regarding recommendations for labs to rule out autoimmune, in-home PT and possible Rheumatology referral   Discussed plans with patient for ongoing care management follow up and provided patient with direct contact information for care management team   Patient Self Care Activities:   Self administers medications as prescribed  Attends all scheduled provider appointments  Calls pharmacy for medication refills  Performs ADL's independently  Performs IADL's independently  Calls provider office for new concerns or questions    Please see past updates related to this goal by clicking on the "Past Updates" button in the selected goal                       Telephone follow up appointment with care management team member scheduled for: 03/04/19   Barb Merino, RN, BSN, CCM Care Management Coordinator [facility]  [facility] Management/[facility]  Direct Phone: [phone]

## 2019-02-24 DIAGNOSIS — F250 Schizoaffective disorder, bipolar type: Secondary | ICD-10-CM | POA: Diagnosis not present

## 2019-03-03 ENCOUNTER — Other Ambulatory Visit: Payer: Self-pay

## 2019-03-03 ENCOUNTER — Encounter: Payer: Self-pay | Admitting: Nurse Practitioner

## 2019-03-03 ENCOUNTER — Ambulatory Visit (INDEPENDENT_AMBULATORY_CARE_PROVIDER_SITE_OTHER): Payer: Medicare HMO | Admitting: Nurse Practitioner

## 2019-03-03 VITALS — BP 118/80 | HR 88 | Temp 97.7°F | Ht 62.8 in | Wt 265.0 lb

## 2019-03-03 DIAGNOSIS — M25561 Pain in right knee: Secondary | ICD-10-CM

## 2019-03-03 DIAGNOSIS — E785 Hyperlipidemia, unspecified: Secondary | ICD-10-CM

## 2019-03-03 DIAGNOSIS — E119 Type 2 diabetes mellitus without complications: Secondary | ICD-10-CM | POA: Diagnosis not present

## 2019-03-03 DIAGNOSIS — R600 Localized edema: Secondary | ICD-10-CM

## 2019-03-03 DIAGNOSIS — G8929 Other chronic pain: Secondary | ICD-10-CM

## 2019-03-03 DIAGNOSIS — I10 Essential (primary) hypertension: Secondary | ICD-10-CM | POA: Diagnosis not present

## 2019-03-03 DIAGNOSIS — G4733 Obstructive sleep apnea (adult) (pediatric): Secondary | ICD-10-CM | POA: Diagnosis not present

## 2019-03-03 NOTE — Progress Notes (Signed)
Subjective:     Patient ID: Kimberly Gross , female    DOB: May 03, 1951 , 68 y.o.   MRN: [id number]   Chief Complaint  Patient presents with  . Diabetes    HPI  Wt Readings from Last 3 Encounters: 03/03/19 : 265 lb (120.2 kg) 12/27/18 : 262 lb (118.8 kg) 12/08/18 : 260 lb (117.9 kg)  She has not been able to get to the podiatrist for nail clipping due to the Pandemic.     Diabetes She presents for her follow-up diabetic visit. She has type 2 diabetes mellitus. No MedicAlert identification noted. Her disease course has been improving. Pertinent negatives for hypoglycemia include no dizziness. Pertinent negatives for diabetes include no blurred vision, no chest pain, no fatigue, no polydipsia, no polyphagia and no polyuria. Symptoms are stable. Pertinent negatives for diabetic complications include no CVA. Risk factors for coronary artery disease include obesity, hypertension, dyslipidemia and diabetes mellitus. Current diabetic treatment includes insulin injections and oral agent (dual therapy) (Xultophy 30 units daily.  ). She is compliant with treatment all of the time. She is following a generally healthy diet. When asked about meal planning, she reported none. She has had a previous visit with a dietitian. She participates in exercise three times a week. Her home blood glucose trend is decreasing steadily. Her overall blood glucose range is 140-180 mg/dl. (Her blood sugars are no more than 168, usually in the 90's.  She ate late last night.  ) An ACE inhibitor/angiotensin II receptor blocker is being taken. She does not see a podiatrist.Eye exam is current.  Hypertension This is a chronic problem. The current episode started more than 1 year ago. The problem is unchanged. The problem is controlled. Pertinent negatives include no blurred vision, chest pain or palpitations. There are no associated agents to hypertension. There are no known risk factors for coronary artery disease. Past  treatments include ACE inhibitors. The current treatment provides no improvement. There are no compliance problems.  There is no history of angina or CVA. There is no history of chronic renal disease.     Past Medical History:  Diagnosis Date  . Anemia   . Anxiety   . Bipolar 1 disorder ([place])   . Depression   . Diabetes mellitus without complication ([place])   . Hyperlipemia   . Hypertension   . Occasional tremors      Family History  Problem Relation Age of Onset  . Hypertension Mother   . Heart attack Father      Current Outpatient Medications:  .  Accu-Chek Softclix Lancets lancets, USE AS DIRECTED  TO CHECK BLOOD GLUCOSE, Disp: 200 each, Rfl: 3 .  acetaminophen (TYLENOL) 500 MG tablet, Take 1,000 mg by mouth 2 (two) times a day., Disp: , Rfl:  .  Alcohol Swabs (B-D SINGLE USE SWABS REGULAR) PADS, , Disp: , Rfl:  .  amLODipine (NORVASC) 10 MG tablet, TAKE 1 TABLET EVERY DAY, Disp: 90 tablet, Rfl: 0 .  aspirin 81 MG tablet, Take 81 mg by mouth daily. , Disp: , Rfl:  .  atorvastatin (LIPITOR) 40 MG tablet, Take 1 tablet (40 mg total) by mouth at bedtime., Disp: 90 tablet, Rfl: 1 .  Blood Glucose Monitoring Suppl (ACCU-CHEK AVIVA PLUS) w/Device KIT, Check blood sugar twice a day, Disp: 1 kit, Rfl: 0 .  carvedilol (COREG) 3.125 MG tablet, Take 1 tablet (3.125 mg total) by mouth 2 (two) times daily., Disp: 180 tablet, Rfl: 3 .  clonazePAM (KLONOPIN)  0.5 MG tablet, Take 1 tablet (0.5 mg total) by mouth 2 (two) times daily., Disp: 30 tablet, Rfl: 0 .  divalproex (DEPAKOTE) 500 MG DR tablet, 500 mg. 2 pills 2 times per day, Disp: , Rfl:  .  DM-APAP-CPM (CORICIDIN HBP PO), Take by mouth as needed., Disp: , Rfl:  .  DROPLET PEN NEEDLES 32G X 4 MM MISC, USE WITH XULTOPHY ONE TIME DAILY. , Disp: 100 each, Rfl: 2 .  fenofibrate 160 MG tablet, TAKE 1 TABLET EVERY DAY, Disp: 90 tablet, Rfl: 0 .  ferrous sulfate 325 (65 FE) MG tablet, Take 325 mg by mouth daily with breakfast., Disp: , Rfl:  .   glucose blood (ACCU-CHEK AVIVA PLUS) test strip, Use as instructed, Disp: 300 each, Rfl: 3 .  loratadine (CLARITIN) 10 MG tablet, Take 1 tablet (10 mg total) by mouth daily. (Patient taking differently: Take 10 mg by mouth as needed. ), Disp: 30 tablet, Rfl: 1 .  Multiple Vitamins-Minerals (MULTIVITAMIN ADULT PO), Use as directed 1 tablet in the mouth or throat daily., Disp: , Rfl:  .  vitamin B-12 (CYANOCOBALAMIN) 250 MCG tablet, Take 250 mcg by mouth daily., Disp: , Rfl:  .  XULTOPHY 100-3.6 UNIT-MG/ML SOPN, INJECT 20 UNITS BY SUBCUTANEOUS ROUTE EVERY DAY (Patient taking differently: Inject 30 Units into the skin daily. ), Disp: 15 pen, Rfl: 2   Allergies  Allergen Reactions  . Chlorpromazine Rash     Review of Systems  Constitutional: Negative.  Negative for fatigue.  Eyes: Negative for blurred vision.  Cardiovascular: Negative.  Negative for chest pain, palpitations and leg swelling.  Endocrine: Negative for polydipsia, polyphagia and polyuria.  Genitourinary: Negative.   Musculoskeletal: Negative.   Neurological: Negative.  Negative for dizziness.  Psychiatric/Behavioral: Negative.      Today's Vitals   03/03/19 1022  BP: 118/80  Pulse: 88  Temp: 97.7 F (36.5 C)  TempSrc: Oral  Weight: 265 lb (120.2 kg)  Height: 5' 2.8" (1.595 m)  PainSc: 5    Body mass index is 47.24 kg/m.   Objective:  Physical Exam Constitutional:      General: She is not in acute distress.    Appearance: Normal appearance. She is well-developed. She is obese.  Eyes:     Extraocular Movements: Extraocular movements intact.     Conjunctiva/sclera: Conjunctivae normal.     Pupils: Pupils are equal, round, and reactive to light.  Neck:     Musculoskeletal: Normal range of motion and neck supple.  Cardiovascular:     Rate and Rhythm: Normal rate and regular rhythm.     Pulses: Normal pulses.     Heart sounds: Normal heart sounds. No murmur.  Pulmonary:     Effort: Pulmonary effort is normal.      Breath sounds: Normal breath sounds. No wheezing.  Chest:     Chest wall: No tenderness.  Skin:    General: Skin is warm and dry.     Capillary Refill: Capillary refill takes less than 2 seconds.  Neurological:     General: No focal deficit present.     Mental Status: She is alert and oriented to person, place, and time.  Psychiatric:        Mood and Affect: Mood normal.        Behavior: Behavior normal.        Thought Content: Thought content normal.        Judgment: Judgment normal.         Assessment And  Plan:    1. Type 2 diabetes mellitus without complication, without long-term current use of insulin (HCC)  Chronic, improving with last HgbA1c of 5.6  Doing well with Xultophy 30 units daily  She is unable to have her toenails clipped with the podiatrist due to the pandemic  Continue with your healthy eating and exercising - Hemoglobin A1c  2. Essential hypertension  Chronic, great control.    Continue with current medications - BMP8+eGFR  3. Hyperlipidemia, unspecified hyperlipidemia type  Chronic, controlled  Continue with current medications .- Lipid Profile  4. Chronic pain of right knee  She is to go for an xray  Encouraged to use a pain cream  No pain on palpation - DG Knee Complete 4 Views Right; Future  5. Edema of right lower extremity  Pulse is good  Will send for venous doppler to evaluate for DVT  She is advised to take ASA 81 mg daily as preventative measure  1+ edema noted to right lower extremity  No recent trips for more than 2 hours however her obesity increases her risk for DVT - VAS [place] LOWER EXTREMITY VENOUS (DVT); Future    [name], FNP

## 2019-03-04 ENCOUNTER — Telehealth: Payer: Self-pay

## 2019-03-04 ENCOUNTER — Ambulatory Visit (INDEPENDENT_AMBULATORY_CARE_PROVIDER_SITE_OTHER): Payer: Medicare HMO

## 2019-03-04 DIAGNOSIS — I10 Essential (primary) hypertension: Secondary | ICD-10-CM | POA: Diagnosis not present

## 2019-03-04 DIAGNOSIS — E119 Type 2 diabetes mellitus without complications: Secondary | ICD-10-CM

## 2019-03-04 DIAGNOSIS — E785 Hyperlipidemia, unspecified: Secondary | ICD-10-CM | POA: Diagnosis not present

## 2019-03-04 DIAGNOSIS — R29898 Other symptoms and signs involving the musculoskeletal system: Secondary | ICD-10-CM

## 2019-03-04 NOTE — Chronic Care Management (AMB) (Signed)
  Chronic Care Management   Outreach Note  03/04/2019 Name: FUMIKO ECKERSLEY MRN: [id number] DOB: [birth year]  Referred by: [name], FNP Reason for referral : Chronic Care Management (CCM RNCM Telephone Follow up)   An unsuccessful telephone outreach was attempted today. The patient was referred to the case management team by [name] FNP for assistance with chronic care management and care coordination.   Follow Up Plan: Telephone follow up appointment with care management team member scheduled for: 03/11/19   Barb Merino, RN, BSN, CCM Care Management Coordinator [place] [facility]  Direct Phone: [phone]

## 2019-03-07 ENCOUNTER — Ambulatory Visit: Payer: Self-pay | Admitting: Pharmacist

## 2019-03-07 NOTE — Progress Notes (Signed)
  Chronic Care Management   Outreach Note  03/07/2019 Name: Kimberly Gross MRN: [id number] DOB: 1950-12-28  Referred by: [name], FNP Reason for referral : Chronic Care Management   An unsuccessful telephone outreach was attempted today. The patient was referred to the case management team by for assistance with chronic care management and care coordination.   Follow Up Plan: The care management team will reach out to the patient again over the next 7-10 business days.   Regina Eck, PharmD, BCPS Clinical Pharmacist, [facility] [place]: [phone]

## 2019-03-09 ENCOUNTER — Ambulatory Visit
Admission: RE | Admit: 2019-03-09 | Discharge: 2019-03-09 | Disposition: A | Discharge status: Home / Self Care | Payer: Medicare HMO | Source: Ambulatory Visit | Attending: Nurse Practitioner | Admitting: Nurse Practitioner

## 2019-03-09 ENCOUNTER — Other Ambulatory Visit: Payer: Self-pay | Admitting: Nurse Practitioner

## 2019-03-09 ENCOUNTER — Other Ambulatory Visit: Payer: Self-pay

## 2019-03-09 DIAGNOSIS — R600 Localized edema: Secondary | ICD-10-CM

## 2019-03-09 DIAGNOSIS — Z1231 Encounter for screening mammogram for malignant neoplasm of breast: Secondary | ICD-10-CM

## 2019-03-09 IMAGING — MG DIGITAL SCREENING BILATERAL MAMMOGRAM WITH TOMO AND CAD
6 of 12 series · 6 of 36 positions shown · non-contrast
Comparison: Previous exam(s).

CLINICAL DATA: Screening.

EXAM:
DIGITAL SCREENING BILATERAL MAMMOGRAM WITH TOMO AND CAD

[R MLO synth-2D (1 of 2)]
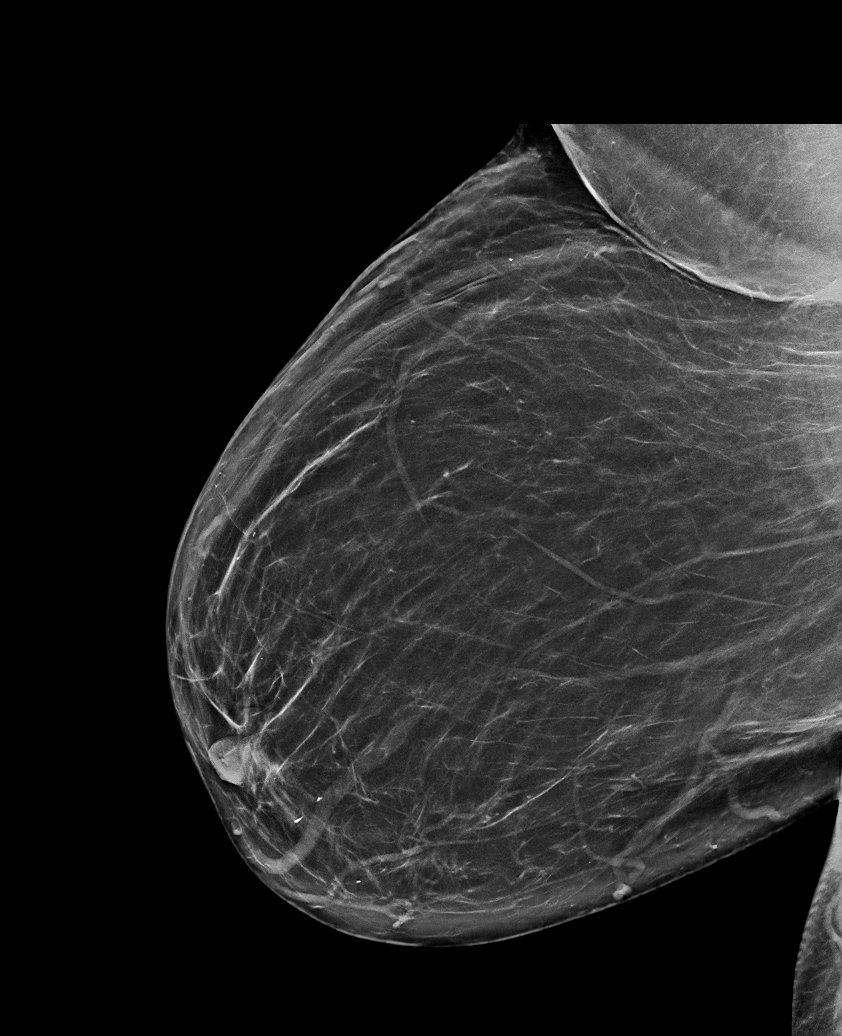

[L MLO synth-2D (1 of 2)]
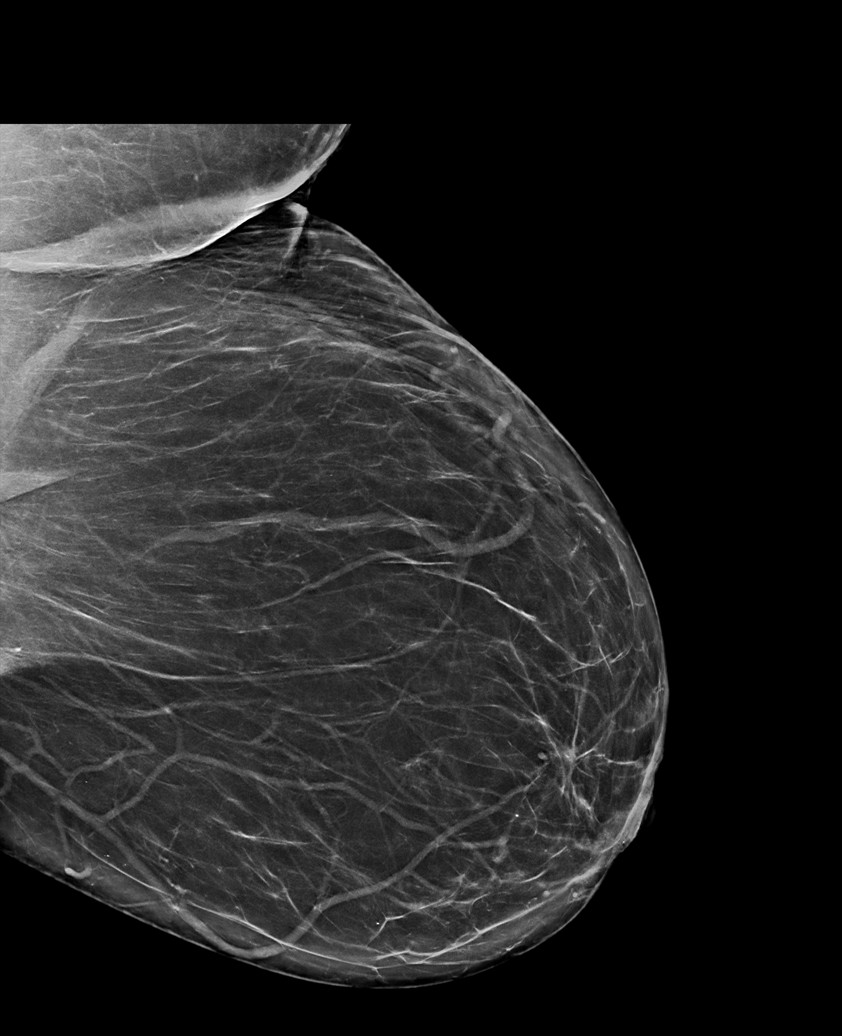

[R MLO synth-2D (2 of 2)]
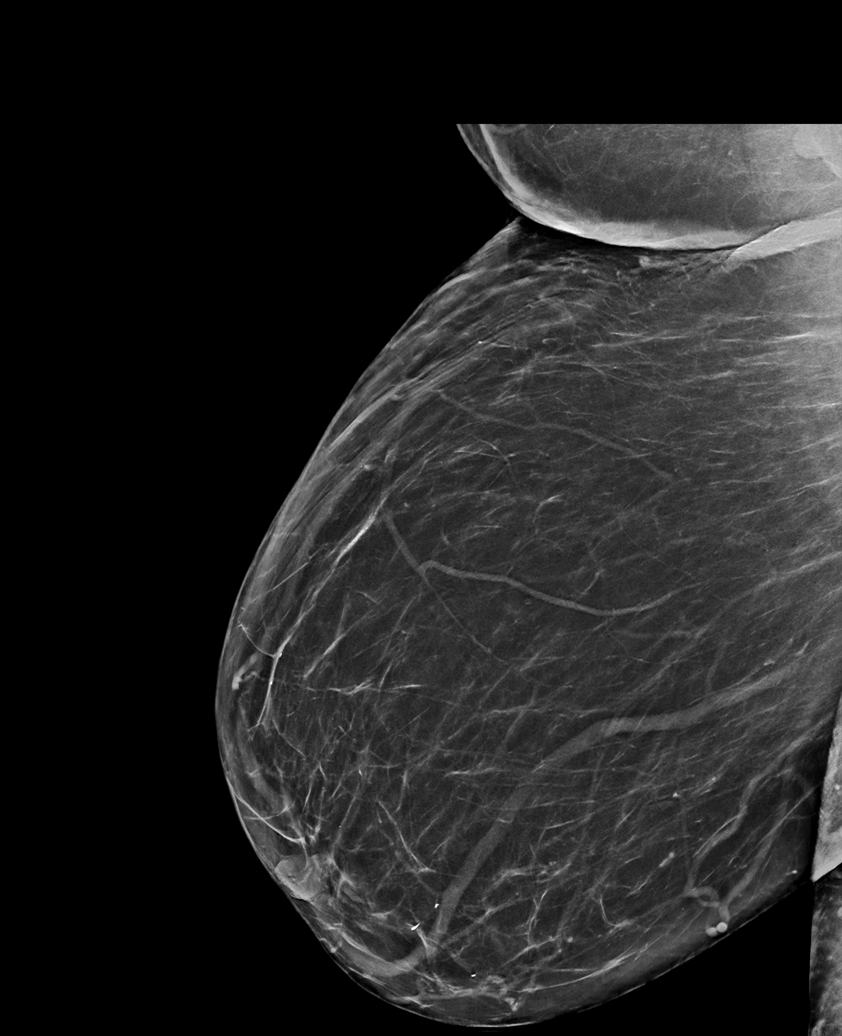

[L CC synth-2D]
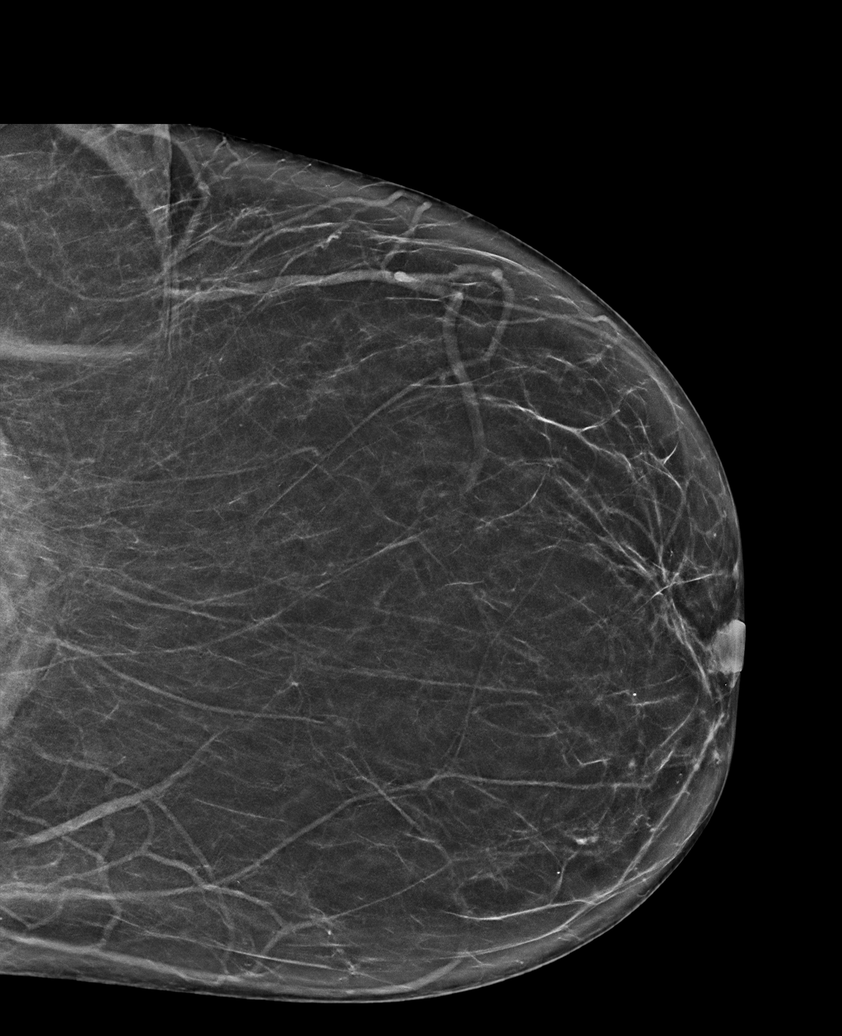

[L MLO synth-2D (2 of 2)]
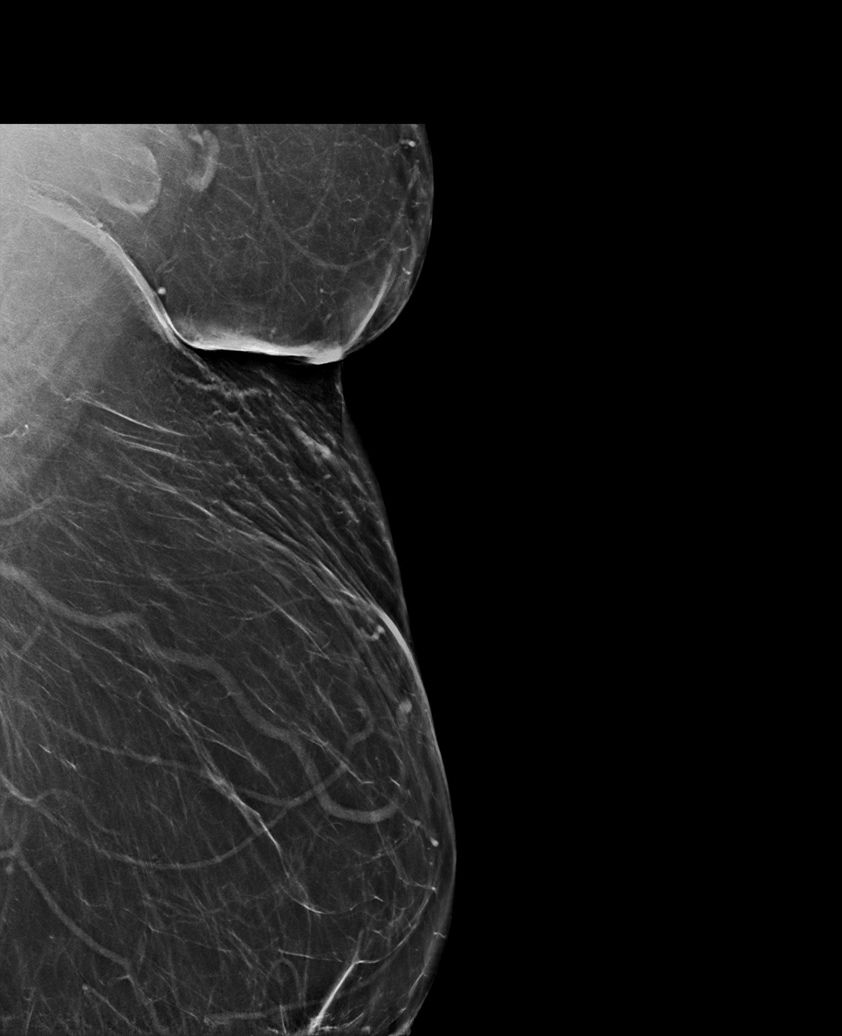

[R CC synth-2D]
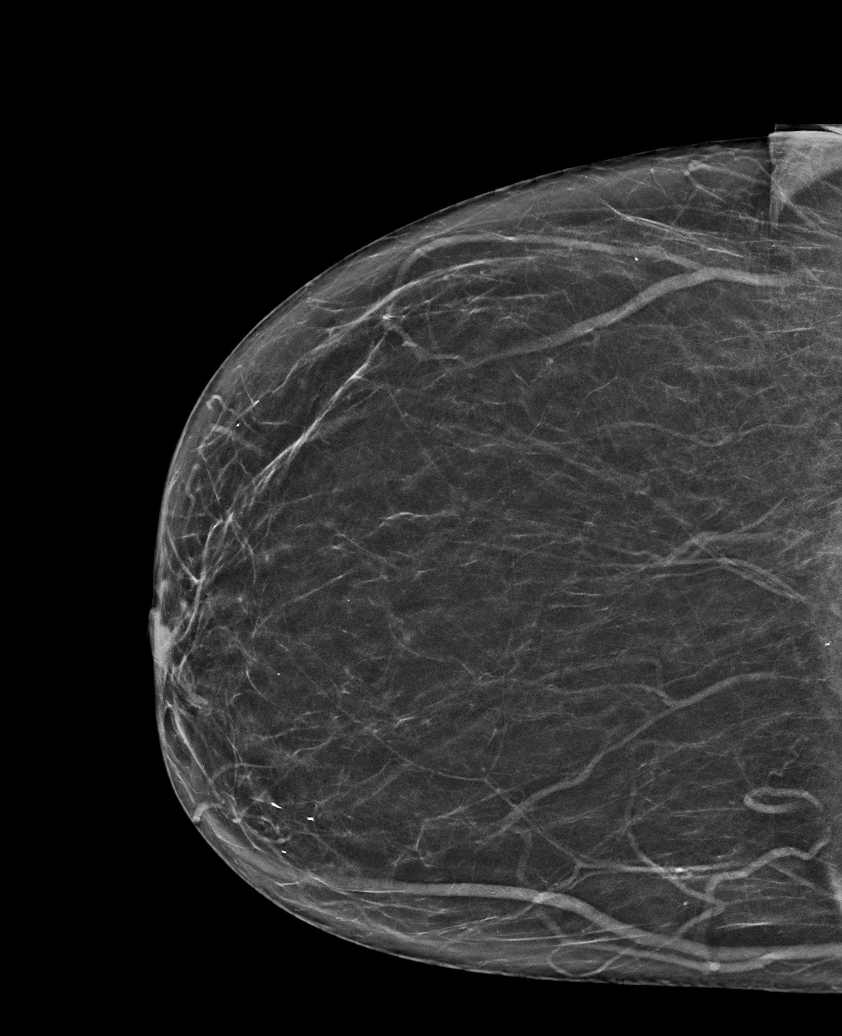

[6 of 36 positions shown; findings below may reference images not displayed]

ACR Breast Density Category b: There are scattered areas of
fibroglandular density.
FINDINGS: There are no findings suspicious for malignancy. Images were
processed with CAD.
IMPRESSION: No mammographic evidence of malignancy. A result letter of this
screening mammogram will be mailed directly to the patient.

RECOMMENDATION:
Screening mammogram in one year. (Code:[TQ])

BI-RADS CATEGORY  1: Negative.

## 2019-03-10 ENCOUNTER — Other Ambulatory Visit: Payer: Self-pay | Admitting: Nurse Practitioner

## 2019-03-11 ENCOUNTER — Telehealth: Payer: Self-pay

## 2019-03-17 ENCOUNTER — Ambulatory Visit: Payer: Self-pay | Admitting: Pharmacist

## 2019-03-17 DIAGNOSIS — N184 Chronic kidney disease, stage 4 (severe): Secondary | ICD-10-CM | POA: Diagnosis not present

## 2019-03-17 DIAGNOSIS — E1122 Type 2 diabetes mellitus with diabetic chronic kidney disease: Secondary | ICD-10-CM | POA: Diagnosis not present

## 2019-03-17 DIAGNOSIS — G4730 Sleep apnea, unspecified: Secondary | ICD-10-CM | POA: Diagnosis not present

## 2019-03-17 DIAGNOSIS — Z23 Encounter for immunization: Secondary | ICD-10-CM | POA: Diagnosis not present

## 2019-03-17 DIAGNOSIS — Z6841 Body Mass Index (BMI) 40.0 and over, adult: Secondary | ICD-10-CM | POA: Diagnosis not present

## 2019-03-17 DIAGNOSIS — E1129 Type 2 diabetes mellitus with other diabetic kidney complication: Secondary | ICD-10-CM | POA: Diagnosis not present

## 2019-03-17 DIAGNOSIS — I129 Hypertensive chronic kidney disease with stage 1 through stage 4 chronic kidney disease, or unspecified chronic kidney disease: Secondary | ICD-10-CM | POA: Diagnosis not present

## 2019-03-17 DIAGNOSIS — N261 Atrophy of kidney (terminal): Secondary | ICD-10-CM | POA: Diagnosis not present

## 2019-03-22 NOTE — Progress Notes (Signed)
  Chronic Care Management   Outreach Note  03/17/2019 Name: Kimberly Gross MRN: [id number] DOB: 09-22-1950  Referred by: [name], FNP Reason for referral : Chronic Care Management   An unsuccessful telephone outreach was attempted today. The patient was referred to the case management team by for assistance with chronic care management and care coordination.   Follow Up Plan: The care management team will reach out to the patient again over the next 7-10 business days.   Regina Eck, PharmD, BCPS Clinical Pharmacist, [facility] [place]: [phone]

## 2019-03-23 DIAGNOSIS — H524 Presbyopia: Secondary | ICD-10-CM | POA: Diagnosis not present

## 2019-03-23 DIAGNOSIS — Z0100 Encounter for examination of eyes and vision without abnormal findings: Secondary | ICD-10-CM | POA: Diagnosis not present

## 2019-03-23 LAB — HM DIABETES EYE EXAM

## 2019-03-29 ENCOUNTER — Ambulatory Visit
Admission: RE | Admit: 2019-03-29 | Discharge: 2019-03-29 | Disposition: A | Discharge status: Home / Self Care | Payer: Medicare HMO | Source: Ambulatory Visit | Attending: Nurse Practitioner | Admitting: Nurse Practitioner

## 2019-03-29 DIAGNOSIS — M25561 Pain in right knee: Secondary | ICD-10-CM

## 2019-03-29 DIAGNOSIS — G8929 Other chronic pain: Secondary | ICD-10-CM

## 2019-03-29 IMAGING — DX DG KNEE COMPLETE 4+V*R*
4 series · 4 of 4 positions shown · non-contrast
Comparison: None.

CLINICAL DATA: Chronic pain right knee

EXAM:
RIGHT KNEE - COMPLETE 4+ VIEW

[dg knee complete 4 views right (1 of 4)]
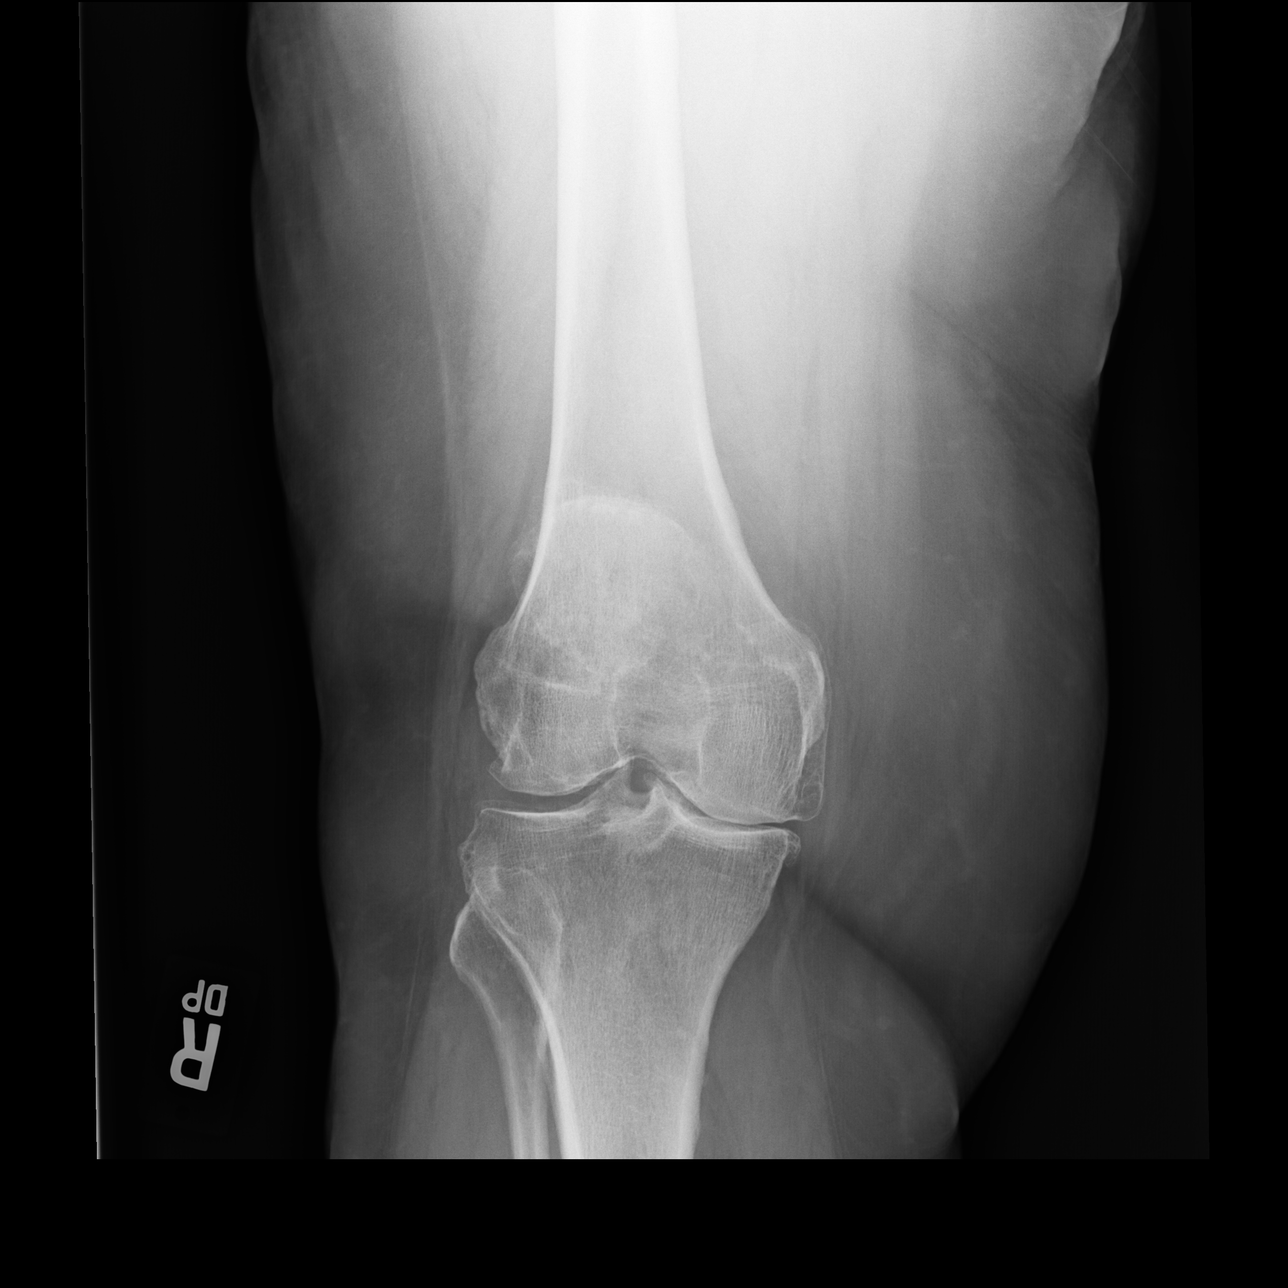

[dg knee complete 4 views right (2 of 4)]
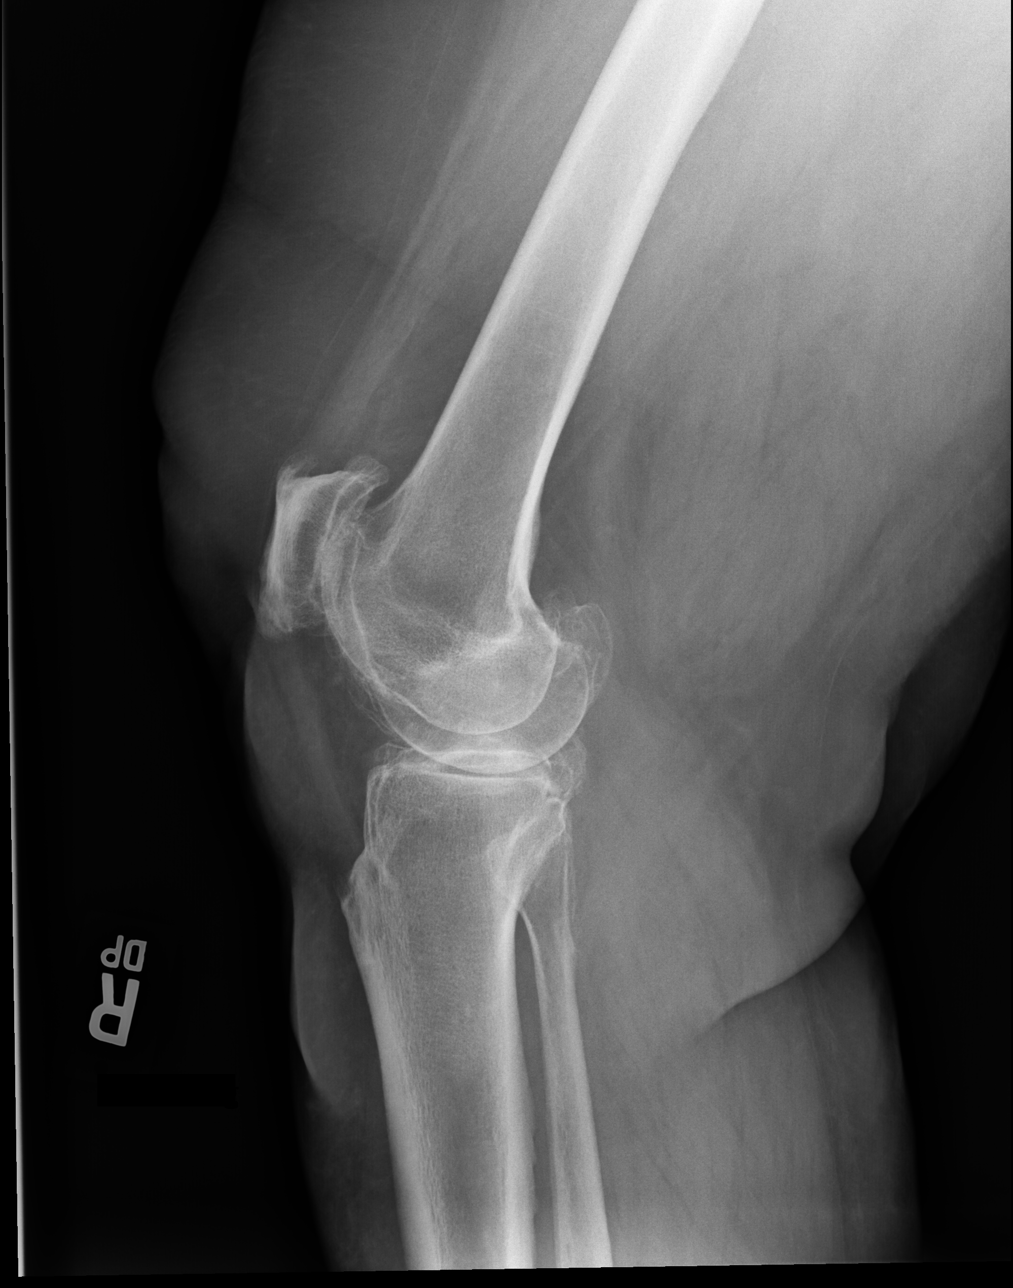

[dg knee complete 4 views right (3 of 4)]
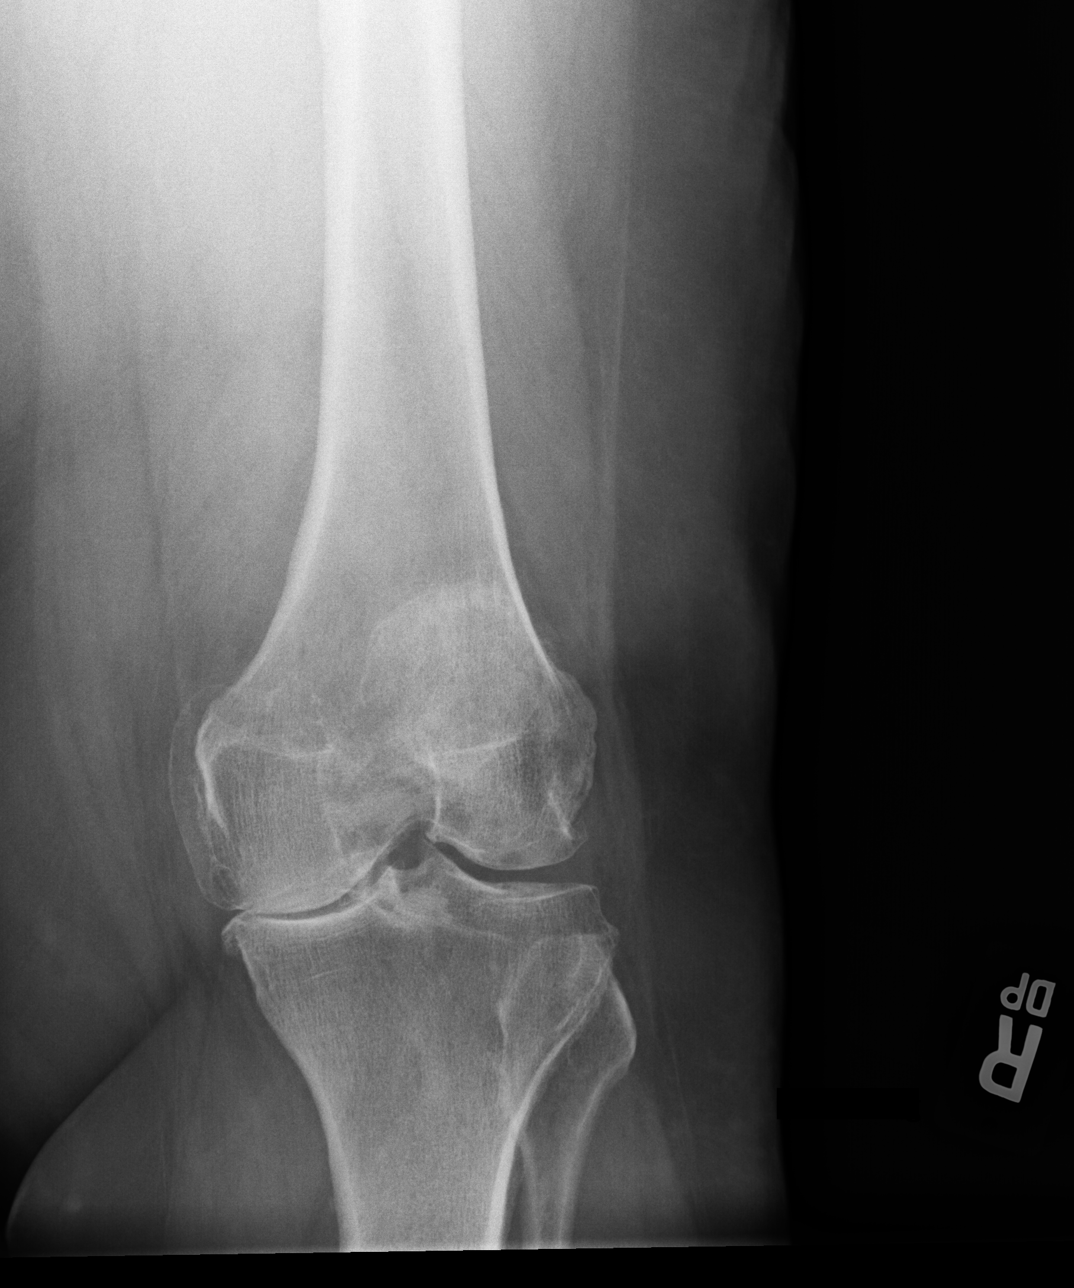

[dg knee complete 4 views right (4 of 4)]
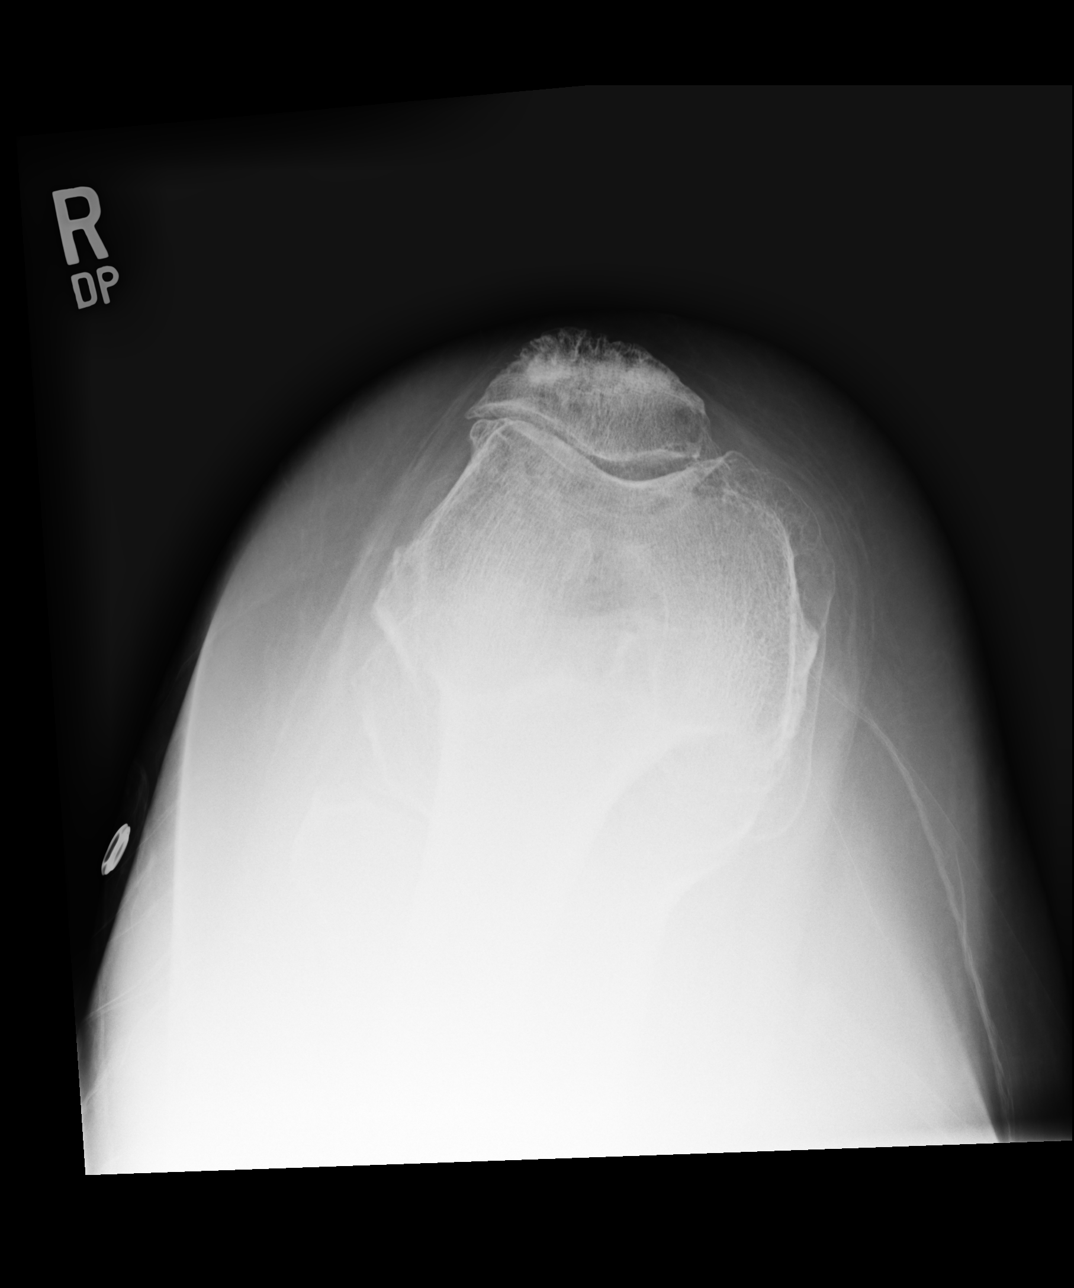

[4 of 4 positions shown; findings below may reference images not displayed]

FINDINGS: No evidence of fracture, dislocation, or joint effusion.
Tricompartmental degenerative changes severe in the medial and
patellofemoral compartment. Soft tissues are unremarkable.
IMPRESSION: Tricompartmental degenerative changes severe in the medial and
patellofemoral compartments.

## 2019-03-30 ENCOUNTER — Telehealth: Payer: Self-pay

## 2019-03-30 NOTE — Telephone Encounter (Signed)
Patient called stating she went to the imaging center for her leg that was swollen. She woke up and her left leg was hurting a lot and she thinks she needs an xray. [phone]  RETURNED PT CALL AND LEFT HER A V/M TO CALL THE OFFICE. SHE NEEDS TO GET SOME COMPRESSION SOCKS PER JANECE MOORE FNP-BC. YRL,RMA

## 2019-04-06 ENCOUNTER — Other Ambulatory Visit: Payer: Self-pay | Admitting: Nurse Practitioner

## 2019-04-06 DIAGNOSIS — G8929 Other chronic pain: Secondary | ICD-10-CM

## 2019-04-11 ENCOUNTER — Telehealth: Payer: Self-pay | Admitting: Adult Health

## 2019-04-11 ENCOUNTER — Other Ambulatory Visit: Payer: Self-pay

## 2019-04-11 ENCOUNTER — Telehealth: Payer: Self-pay

## 2019-04-11 ENCOUNTER — Other Ambulatory Visit: Payer: Self-pay | Admitting: Nurse Practitioner

## 2019-04-11 MED ORDER — ATORVASTATIN CALCIUM 40 MG PO TABS: 40.0000 mg | ORAL_TABLET | Freq: Every day | ORAL | 0 refills | Status: DC

## 2019-04-11 NOTE — Telephone Encounter (Signed)
[employer] called requesting that we send atorvastatin to the local pharmacy. Rx has been sent. YRL,RMA

## 2019-04-11 NOTE — Telephone Encounter (Signed)
Teressa @ [facility] - is asking for a script to be sent to them for pt's atorvastatin (LIPITOR) 40 MG tablet.  Pt is also asking for an emergency amount [id number] while waiting for the full amount to come from [facility].  Teressa @ [facility] is asking for about 2 weeks worth to [facility] [phone]

## 2019-04-11 NOTE — Telephone Encounter (Signed)
I tried to call pt.  Could not LM.  I spoke to [place], [place].  I relayed that the atorvastatin should go her to pcp, Darleen Crocker, FNP.  Yes they had her name but our phone number.  I gave her the phone # to [facility], NP.

## 2019-04-14 ENCOUNTER — Encounter: Payer: Self-pay | Admitting: Nurse Practitioner

## 2019-04-19 ENCOUNTER — Telehealth: Payer: Self-pay

## 2019-04-19 ENCOUNTER — Ambulatory Visit: Payer: Self-pay

## 2019-04-19 DIAGNOSIS — E119 Type 2 diabetes mellitus without complications: Secondary | ICD-10-CM

## 2019-04-19 DIAGNOSIS — I10 Essential (primary) hypertension: Secondary | ICD-10-CM

## 2019-04-19 NOTE — Chronic Care Management (AMB) (Signed)
  Chronic Care Management   Social Work Note  04/19/2019 Name: Kimberly Gross MRN: [id number] DOB: 09-13-1950  Kimberly Gross is a 68 y.o. year old female who sees [name], [place] for primary care. The CCM team was consulted for assistance with chronic care management and care coordination.  CCM SW placed an unsuccessful outbound call to the patient in attempt to assist with care coordination of patient chronic needs. Unfortunately, the patients home number was invalid. HIPAA compliant voice message left on the patients husbands cell number whom is on the patients DPR.   Follow Up Plan: SW will follow up with patient by phone over the next 10 days.  Daneen Schick, BSW, CDP Social Worker, Certified Dementia Practitioner [place] [facility] [phone]

## 2019-04-21 ENCOUNTER — Ambulatory Visit: Payer: Self-pay | Admitting: Pharmacist

## 2019-04-21 DIAGNOSIS — E119 Type 2 diabetes mellitus without complications: Secondary | ICD-10-CM

## 2019-04-21 DIAGNOSIS — I10 Essential (primary) hypertension: Secondary | ICD-10-CM

## 2019-04-21 NOTE — Progress Notes (Signed)
Chronic Care Management   Visit Note  04/21/2019 Name: Kimberly Gross MRN: [id number] DOB: April 18, 1951  Referred by: [name], FNP Reason for referral : Chronic Care Management   Kimberly Gross is a 68 y.o. year old female who is a primary care patient of [name], [place]. The CCM team was consulted for assistance with chronic disease management and care coordination needs related to HTN DMII  Review of patient status, including review of consultants reports, relevant laboratory and other test results, and collaboration with appropriate care team members and the patient's provider was performed as part of comprehensive patient evaluation and provision of chronic care management services.     Advanced Directives Status: N See Care Plan and [facility] application for related entries.   Medications: Outpatient Encounter Medications as of 04/21/2019  Medication Sig Note  . Accu-Chek Softclix Lancets lancets USE AS DIRECTED  TO CHECK BLOOD GLUCOSE   . acetaminophen (TYLENOL) 500 MG tablet Take 1,000 mg by mouth 2 (two) times a day.   . Alcohol Swabs (B-D SINGLE USE SWABS REGULAR) PADS  05/08/2015: Received from: External Pharmacy  . amLODipine (NORVASC) 10 MG tablet TAKE 1 TABLET EVERY DAY   . aspirin 81 MG tablet Take 81 mg by mouth daily.  05/08/2015: Received from: External Pharmacy Received Sig:   . atorvastatin (LIPITOR) 40 MG tablet Take 1 tablet (40 mg total) by mouth at bedtime.   . Blood Glucose Monitoring Suppl (ACCU-CHEK AVIVA PLUS) w/Device KIT Check blood sugar twice a day   . carvedilol (COREG) 3.125 MG tablet Take 1 tablet (3.125 mg total) by mouth 2 (two) times daily.   . clonazePAM (KLONOPIN) 0.5 MG tablet Take 1 tablet (0.5 mg total) by mouth 2 (two) times daily.   . divalproex (DEPAKOTE) 500 MG DR tablet 500 mg. 2 pills 2 times per day 05/08/2015: Received from: External Pharmacy  . DM-APAP-CPM (CORICIDIN HBP PO) Take by mouth as needed.   . DROPLET PEN NEEDLES 32G X 4  MM MISC USE WITH XULTOPHY ONE TIME DAILY.    . fenofibrate 160 MG tablet TAKE 1 TABLET EVERY DAY   . ferrous sulfate 325 (65 FE) MG tablet Take 325 mg by mouth daily with breakfast.   . glucose blood (ACCU-CHEK AVIVA PLUS) test strip Use as instructed   . loratadine (CLARITIN) 10 MG tablet Take 1 tablet (10 mg total) by mouth daily. (Patient taking differently: Take 10 mg by mouth as needed. )   . Multiple Vitamins-Minerals (MULTIVITAMIN ADULT PO) Use as directed 1 tablet in the mouth or throat daily. 05/08/2015: Received from: External Pharmacy Received Sig:   . vitamin B-12 (CYANOCOBALAMIN) 250 MCG tablet Take 250 mcg by mouth daily.   [name] 100-3.6 UNIT-MG/ML SOPN INJECT 20 UNITS BY SUBCUTANEOUS ROUTE EVERY DAY (Patient taking differently: Inject 30 Units into the skin daily. ) 10/20/2018: She states she needs a new Rx for insurance to cover. She also states she has hit the donut hole and cost is increasing now.    No facility-administered encounter medications on file as of 04/21/2019.      Objective:   Goals Addressed            This Visit's Progress     Patient Stated   . COMPLETED: "I am having trouble affording my new diabetes medicine" (pt-stated)       Current Barriers:  . Financial constraints  . Reported payor restrictions on coverage  Clinical Social Work Clinical Goal(s):  .  Over the next 30 days, patient will work with CCM PharmD to address needs related to medication assistance (completed)  Interventions: . Patient interviewed and appropriate assessments performed . Discussed plans with patient for ongoing care management follow up and provided patient with direct contact information for care management team  . Patient currently stable on Xutolphy 30 units daily, however insurance will no longer cover.  She reports FBG range of 90-160.  She realizes 160 is usually due to "not eating as she should", but states this is rare.  Last A1c was 6.8 and patient remains  motivated to continue her efforts in controlling A1c. . Will work with [place], Etter Sjogren to apply for financial assistance for [place] through [employer]. . Comprehensive medication review performed.  Patient tolerating medications with no issues. . Follow up call placed to [employer] by [facility] CPhT, Etter Sjogren regarding patient assistance application(s) for Netty Starring confirms patient has been approved as of 6/24 until 07/14/19. Medication to arrive at providers office in 10-14 business days. . Noted patient has had recent dietician visit on 01/26/19 that was successful. Marland Kitchen Refill request faxed to [employer] on 04/21/19  Patient Self Care Activities:  . Self administers medications as prescribed . Calls pharmacy for medication refills . Calls provider office for new concerns or questions  Please see past updates related to this goal by clicking on the "Past Updates" button in the selected goal         Plan:   The care management team will reach out to the patient again over the next 30 days.   Regina Eck, PharmD, BCPS Clinical Pharmacist, [facility] [place]: [phone]

## 2019-04-21 NOTE — Patient Instructions (Signed)
Visit Information  Goals Addressed            This Visit's Progress     Patient Stated   . COMPLETED: "I am having trouble affording my new diabetes medicine" (pt-stated)       Current Barriers:  . Financial constraints  . Reported payor restrictions on coverage  Clinical Social Work Clinical Goal(s):  Marland Kitchen Over the next 30 days, patient will work with CCM PharmD to address needs related to medication assistance (completed)  Interventions: . Patient interviewed and appropriate assessments performed . Discussed plans with patient for ongoing care management follow up and provided patient with direct contact information for care management team  . Patient currently stable on Xutolphy 30 units daily, however insurance will no longer cover.  She reports FBG range of 90-160.  She realizes 160 is usually due to "not eating as she should", but states this is rare.  Last A1c was 6.8 and patient remains motivated to continue her efforts in controlling A1c. . Will work with [place], Etter Sjogren to apply for financial assistance for [place] through [employer]. . Comprehensive medication review performed.  Patient tolerating medications with no issues. . Follow up call placed to [employer] by [facility] CPhT, Etter Sjogren regarding patient assistance application(s) for Netty Starring confirms patient has been approved as of 6/24 until 07/14/19. Medication to arrive at providers office in 10-14 business days. . Noted patient has had recent dietician visit on 01/26/19 that was successful. Marland Kitchen Refill request faxed to [employer] on 04/21/19  Patient Self Care Activities:  . Self administers medications as prescribed . Calls pharmacy for medication refills . Calls provider office for new concerns or questions  Please see past updates related to this goal by clicking on the "Past Updates" button in the selected goal         The patient verbalized understanding of instructions provided today and  declined a print copy of patient instruction materials.   The care management team will reach out to the patient again over the next 30 days.   Regina Eck, PharmD, BCPS Clinical Pharmacist, [facility] [place]: [phone]

## 2019-04-27 ENCOUNTER — Telehealth: Payer: Self-pay

## 2019-04-27 ENCOUNTER — Ambulatory Visit: Payer: Self-pay

## 2019-04-27 DIAGNOSIS — E119 Type 2 diabetes mellitus without complications: Secondary | ICD-10-CM

## 2019-04-27 NOTE — Chronic Care Management (AMB) (Signed)
  Chronic Care Management   Social Work Note  04/27/2019 Name: Kimberly Gross MRN: [id number] DOB: 02-13-1951  Sw placed an unsuccessful outbound call to assist with care coordination. SW placed call to patient number listed on snapshot [phone]) unfortunately, this number seems to be invalid as a loud continuous beep sounds once call is connected. Upon chart review it is noted patient was recently engaged by embedded PharmD who is assisting with care management needs. SW will communicate to PharmD requesting to have patient contact SW for future resource needs.   Follow Up Plan: Patient will remain engaged with embedded PharmD. Contact SW if needed in the future.  Daneen Schick, BSW, CDP Social Worker, Certified Dementia Practitioner [place] [facility] [place] [facility] [phone]

## 2019-05-04 ENCOUNTER — Telehealth: Payer: Self-pay

## 2019-05-04 ENCOUNTER — Ambulatory Visit: Payer: Self-pay | Admitting: Pharmacist

## 2019-05-04 NOTE — Telephone Encounter (Signed)
Attempted to call pt to inform her that xultophy from [facility] is here. Phone number isnt working

## 2019-05-04 NOTE — Progress Notes (Signed)
  Chronic Care Management   Outreach Note  05/04/2019 Name: Kimberly Gross MRN: [id number] DOB: 10-22-1950  Referred by: [name], FNP Reason for referral : Chronic Care Management   A second unsuccessful telephone outreach was attempted today. The patient was referred to the case management team for assistance with care management and care coordination. Patient's [name] has arrived to PCP office via [employer] patient assistance program.  Message left regarding this information.  Patient has upcoming appt in November.  We need to capture patient's most current mobile phone number.  Follow Up Plan: A HIPPA compliant phone message was left for the patient providing contact information and requesting a return call.  The care management team will reach out to the patient again over the next 14 days.   SIGNATURE

## 2019-05-06 ENCOUNTER — Other Ambulatory Visit: Payer: Self-pay | Admitting: Nurse Practitioner

## 2019-05-09 DIAGNOSIS — G4733 Obstructive sleep apnea (adult) (pediatric): Secondary | ICD-10-CM | POA: Diagnosis not present

## 2019-05-12 ENCOUNTER — Ambulatory Visit: Payer: Self-pay | Admitting: Pharmacist

## 2019-05-12 DIAGNOSIS — E119 Type 2 diabetes mellitus without complications: Secondary | ICD-10-CM

## 2019-05-12 DIAGNOSIS — I10 Essential (primary) hypertension: Secondary | ICD-10-CM

## 2019-05-12 NOTE — Progress Notes (Signed)
  Chronic Care Management   Outreach Note  05/12/2019 Name: Kimberly Gross MRN: [id number] DOB: Nov 28, 1950  Referred by: [name], FNP Reason for referral : Chronic Care Management   An unsuccessful telephone outreach was attempted today. The patient was referred to the case management team by for assistance with care management and care coordination.   Follow Up Plan: A HIPPA compliant phone message was left for the patient providing contact information and requesting a return call.  The care management team will reach out to the patient again over the next 14 days.  Patient's medication Laurel Dimmer) has arrived from patient assistance program.  Encouraged patient to pick up.  SIGNATURE Regina Eck, PharmD, BCPS Clinical Pharmacist, [facility] [place]: [phone]

## 2019-05-13 ENCOUNTER — Ambulatory Visit (INDEPENDENT_AMBULATORY_CARE_PROVIDER_SITE_OTHER): Payer: Medicare HMO | Admitting: Pharmacist

## 2019-05-13 DIAGNOSIS — E119 Type 2 diabetes mellitus without complications: Secondary | ICD-10-CM | POA: Diagnosis not present

## 2019-05-13 DIAGNOSIS — I10 Essential (primary) hypertension: Secondary | ICD-10-CM | POA: Diagnosis not present

## 2019-05-17 NOTE — Patient Instructions (Signed)
Visit Information  Goals Addressed            This Visit's Progress     Patient Stated   . I would like to continue to manage my diabetes (pt-stated)       Current Barriers:  . Diabetes: T2DM; most recent A1c 6.8% on 09/01/18 (repeat A1c this month at PCP visit on 06/07/19)  . Current antihyperglycemic regimen: Xutolphy 30 units daily . denies hypoglycemic symptoms; denies hyperglycemic symptoms . Current meal patterns: o Patient's spouse recently passed away, therefore patient has been eating mostly foods brought from friends and family.  She is aware of how her diet should be and tries to incorporate low carb/sugar at every meal. . Current exercise: n/a . Current blood glucose readings: FBG 90-130 . Cardiovascular risk reduction: o Current hypertensive regimen: carvedilol, amlodipine o Current hyperlipidemia regimen: atorvastatin 40mg  daily  Pharmacist Clinical Goal(s):  Marland Kitchen Over the next 90 days, patient with work with PharmD and primary care provider to address need related to optimization of medication management of chronic conditions  Interventions: . Comprehensive medication review performed, medication list updated in electronic medical record . Reviewed & discussed the following diabetes-related information with patient: o Continue checking blood sugars as directed o Follow ADA recommended "diabetes-friendly" diet  (reviewed healthy snack/food options) o Discussed insulin/GLP-1 injection technique o Reviewed medication purpose/side effects-->patient denies adverse events  Patient Self Care Activities:  . Patient will check blood glucose daily in the AM , document, and provide at future appointments . Patient will focus on medication adherence by continuing to take medications as prescribed . Patient will take medications as prescribed . Patient will contact provider with any episodes of hypoglycemia . Patient will report any questions or concerns to provider   Initial  goal documentation        The patient verbalized understanding of instructions provided today and declined a print copy of patient instruction materials.   The care management team will reach out to the patient again over the next 30 days.   SIGNATURE Regina Eck, PharmD, BCPS Clinical Pharmacist, [facility] [place]: [phone]

## 2019-05-17 NOTE — Progress Notes (Signed)
Chronic Care Management   Visit Note  05/13/2019 Name: Kimberly Gross MRN: [id number] DOB: 08/09/1950  Referred by: [name], FNP Reason for referral : Chronic Care Management   Kimberly Gross is a 68 y.o. year old female who is a primary care patient of [name], [place]. The CCM team was consulted for assistance with chronic disease management and care coordination needs related to DMII  Review of patient status, including review of consultants reports, relevant laboratory and other test results, and collaboration with appropriate care team members and the patient's provider was performed as part of comprehensive patient evaluation and provision of chronic care management services.    I spoke with Kimberly Gross by telephone today.  Advanced Directives Status: N See Care Plan and [facility] application for related entries.   Medications: Outpatient Encounter Medications as of 05/13/2019  Medication Sig Note  . Accu-Chek Softclix Lancets lancets USE AS DIRECTED  TO CHECK BLOOD GLUCOSE   . acetaminophen (TYLENOL) 500 MG tablet Take 1,000 mg by mouth 2 (two) times a day.   . Alcohol Swabs (B-D SINGLE USE SWABS REGULAR) PADS  05/08/2015: Received from: External Pharmacy  . amLODipine (NORVASC) 10 MG tablet TAKE 1 TABLET EVERY DAY   . aspirin 81 MG tablet Take 81 mg by mouth daily.  05/08/2015: Received from: External Pharmacy Received Sig:   . atorvastatin (LIPITOR) 40 MG tablet TAKE 1 TABLET BY MOUTH EVERYDAY AT BEDTIME   . Blood Glucose Monitoring Suppl (ACCU-CHEK AVIVA PLUS) w/Device KIT Check blood sugar twice a day   . carvedilol (COREG) 3.125 MG tablet Take 1 tablet (3.125 mg total) by mouth 2 (two) times daily.   . clonazePAM (KLONOPIN) 0.5 MG tablet Take 1 tablet (0.5 mg total) by mouth 2 (two) times daily.   . divalproex (DEPAKOTE) 500 MG DR tablet 500 mg. 2 pills 2 times per day 05/08/2015: Received from: External Pharmacy  . DM-APAP-CPM (CORICIDIN HBP PO) Take by mouth as  needed.   . DROPLET PEN NEEDLES 32G X 4 MM MISC USE WITH XULTOPHY ONE TIME DAILY.    . fenofibrate 160 MG tablet TAKE 1 TABLET EVERY DAY   . ferrous sulfate 325 (65 FE) MG tablet Take 325 mg by mouth daily with breakfast.   . glucose blood (ACCU-CHEK AVIVA PLUS) test strip Use as instructed   . loratadine (CLARITIN) 10 MG tablet Take 1 tablet (10 mg total) by mouth daily. (Patient taking differently: Take 10 mg by mouth as needed. )   . Multiple Vitamins-Minerals (MULTIVITAMIN ADULT PO) Use as directed 1 tablet in the mouth or throat daily. 05/08/2015: Received from: External Pharmacy Received Sig:   . vitamin B-12 (CYANOCOBALAMIN) 250 MCG tablet Take 250 mcg by mouth daily.   Kimberly Gross 100-3.6 UNIT-MG/ML SOPN INJECT 20 UNITS BY SUBCUTANEOUS ROUTE EVERY DAY (Patient taking differently: Inject 30 Units into the skin daily. ) 10/20/2018: She states she needs a new Rx for insurance to cover. She also states she has hit the donut hole and cost is increasing now.    No facility-administered encounter medications on file as of 05/13/2019.      Objective:   Goals Addressed            This Visit's Progress     Patient Stated   . I would like to continue to manage my diabetes (pt-stated)       Current Barriers:  . Diabetes: T2DM; most recent A1c 6.8% on 09/01/18 (repeat A1c this month at  PCP visit on 06/07/19)  . Current antihyperglycemic regimen: Xutolphy 30 units daily . denies hypoglycemic symptoms; denies hyperglycemic symptoms . Current meal patterns: o Patient's spouse recently passed away, therefore patient has been eating mostly foods brought from friends and family.  She is aware of how her diet should be and tries to incorporate low carb/sugar at every meal. . Current exercise: n/a . Current blood glucose readings: FBG 90-130 . Cardiovascular risk reduction: o Current hypertensive regimen: carvedilol, amlodipine o Current hyperlipidemia regimen: atorvastatin 41m daily  Pharmacist  Clinical Goal(s):  .Marland KitchenOver the next 90 days, patient with work with PharmD and primary care provider to address need related to optimization of medication management of chronic conditions  Interventions: . Comprehensive medication review performed, medication list updated in electronic medical record . Reviewed & discussed the following diabetes-related information with patient: o Continue checking blood sugars as directed o Follow ADA recommended "diabetes-friendly" diet  (reviewed healthy snack/food options) o Discussed insulin/GLP-1 injection technique o Reviewed medication purpose/side effects-->patient denies adverse events  Patient Self Care Activities:  . Patient will check blood glucose daily in the AM , document, and provide at future appointments . Patient will focus on medication adherence by continuing to take medications as prescribed . Patient will take medications as prescribed . Patient will contact provider with any episodes of hypoglycemia . Patient will report any questions or concerns to provider   Initial goal documentation        Plan:   The care management team will reach out to the patient again over the next 30 days.   Provider Signature .JRegina Eck PharmD, BCPS Clinical Pharmacist, [facility] [place] [phone]

## 2019-05-24 ENCOUNTER — Telehealth: Payer: Self-pay

## 2019-05-30 ENCOUNTER — Telehealth: Payer: Self-pay

## 2019-06-07 ENCOUNTER — Ambulatory Visit: Payer: Medicare HMO | Admitting: Nurse Practitioner

## 2019-06-15 ENCOUNTER — Other Ambulatory Visit: Payer: Self-pay

## 2019-06-15 ENCOUNTER — Encounter: Payer: Medicare HMO | Attending: Nurse Practitioner | Admitting: *Deleted

## 2019-06-15 DIAGNOSIS — I739 Peripheral vascular disease, unspecified: Secondary | ICD-10-CM | POA: Diagnosis not present

## 2019-06-15 DIAGNOSIS — L603 Nail dystrophy: Secondary | ICD-10-CM | POA: Diagnosis not present

## 2019-06-15 DIAGNOSIS — E1051 Type 1 diabetes mellitus with diabetic peripheral angiopathy without gangrene: Secondary | ICD-10-CM | POA: Diagnosis not present

## 2019-06-15 DIAGNOSIS — L84 Corns and callosities: Secondary | ICD-10-CM | POA: Diagnosis not present

## 2019-06-21 ENCOUNTER — Telehealth: Payer: Self-pay

## 2019-06-23 ENCOUNTER — Ambulatory Visit: Payer: Self-pay | Admitting: Pharmacist

## 2019-06-23 DIAGNOSIS — E119 Type 2 diabetes mellitus without complications: Secondary | ICD-10-CM

## 2019-06-23 NOTE — Progress Notes (Signed)
  Chronic Care Management   Outreach Note  06/23/2019 Name: Kimberly Gross MRN: [id number] DOB: Jul 25, 1950  Referred by: [name], FNP Reason for referral : Chronic Care Management   An unsuccessful telephone outreach was attempted today. The patient was referred to the case management team by for assistance with care management and care coordination.   Follow Up Plan: The care management team will reach out to the patient again over the next 7-10 days.  SIGNATURE Regina Eck, PharmD, BCPS Clinical Pharmacist, [facility] [place]: [phone]

## 2019-07-01 ENCOUNTER — Telehealth: Payer: Self-pay

## 2019-07-04 ENCOUNTER — Ambulatory Visit (INDEPENDENT_AMBULATORY_CARE_PROVIDER_SITE_OTHER): Payer: Medicare HMO | Admitting: Pharmacist

## 2019-07-04 ENCOUNTER — Other Ambulatory Visit: Payer: Self-pay | Admitting: Pharmacy Technician

## 2019-07-04 DIAGNOSIS — E119 Type 2 diabetes mellitus without complications: Secondary | ICD-10-CM | POA: Diagnosis not present

## 2019-07-04 NOTE — Patient Instructions (Signed)
Visit Information  Goals Addressed            This Visit's Progress     Patient Stated   . "I am having trouble affording my new diabetes medicine" (pt-stated)       Current Barriers:  . Financial constraints  . Reported payor restrictions on coverage  Clinical Social Work Clinical Goal(s):  Marland Kitchen Over the next 30 days, patient will work with CCM PharmD to address needs related to medication assistance (goal re-established 07/04/19)  Interventions: Call placed to patient on 07/04/19 . Patient interviewed and appropriate assessments performed . Discussed plans with patient for ongoing care management follow up and provided patient with direct contact information for care management team  . Patient currently stable on Xutolphy 30 units daily, however insurance will no longer cover.  She reports FBG range of 90-150.  She realizes 160 is usually due to "not eating as she should", but states this is rare.  Last A1c was 6.8% and patient remains motivated to continue her efforts in controlling A1c. . Will work with [place], Etter Sjogren to apply for financial assistance for [place] through [employer]. . Comprehensive medication review performed.  Patient tolerating medications with no issues. Marland Kitchen Application mailed to patient week of 07/04/19   Patient Self Care Activities:  . Self administers medications as prescribed . Calls pharmacy for medication refills . Calls provider office for new concerns or questions  Please see past updates related to this goal by clicking on the "Past Updates" button in the selected goal      . I would like to continue to manage my diabetes (pt-stated)       Current Barriers:  . Diabetes: T2DM; most recent A1c 6.8% on 09/01/18 (repeat A1c was missed, next PCP visit on 09/07/19)  . Current antihyperglycemic regimen: Xutolphy 30 units daily . denies hypoglycemic symptoms; denies hyperglycemic symptoms . Current meal patterns: o Patient's spouse recently  passed away, therefore patient has been eating mostly foods brought from friends and family.  She is aware of how her diet should be and tries to incorporate low carb/sugar at every meal. . Current exercise: n/a . Current blood glucose readings: FBG 90-130 . Cardiovascular risk reduction: o Current hypertensive regimen: carvedilol, amlodipine o Current hyperlipidemia regimen: atorvastatin 40mg  daily  Pharmacist Clinical Goal(s):  Marland Kitchen Over the next 90 days, patient with work with PharmD and primary care provider to address need related to optimization of medication management of chronic conditions  Interventions: Call placed to patient on 07/04/19 . Comprehensive medication review performed, medication list updated in electronic medical record . Reviewed & discussed the following diabetes-related information with patient: o Continue checking blood sugars as directed o Follow ADA recommended "diabetes-friendly" diet  (reviewed healthy snack/food options) o Discussed insulin/GLP-1 injection technique o Reviewed medication purpose/side effects-->patient denies adverse events  Patient Self Care Activities:  . Patient will check blood glucose daily in the AM , document, and provide at future appointments . Patient will focus on medication adherence by continuing to take medications as prescribed . Patient will take medications as prescribed . Patient will contact provider with any episodes of hypoglycemia . Patient will report any questions or concerns to provider   Please see past updates related to this goal by clicking on the "Past Updates" button in the selected goal         The patient verbalized understanding of instructions provided today and declined a print copy of patient instruction materials.   The  care management team will reach out to the patient again over the next 30 days.   SIGNATURE Regina Eck, PharmD, BCPS Clinical Pharmacist, [facility] [place]: [phone]

## 2019-07-04 NOTE — Patient Outreach (Signed)
[place] [facility]) Care Management  07/04/2019  Kimberly Gross 02/05/1951 [id number]                                       Medication Assistance Referral  Referral From: [facility] Embedded RPh Jenne Pane   Medication/Company: [name] / Nov[employer] Patient application portion:  Mailed Provider application portion: Faxed  to J. Laurance Flatten, DNP, FNP-BC Provider address/fax verified via: Office website   Follow up:  Will follow up with patient in 10-14 business days to confirm application(s) have been received.  Maud Deed Chana Bode Humbird Certified Pharmacy Technician [place] Management Direct [phone]

## 2019-07-04 NOTE — Progress Notes (Signed)
Chronic Care Management   Visit Note  07/04/2019 Name: Kimberly Gross MRN: [id number] DOB: 02-17-1951  Referred by: Kimberly Brine, FNP Reason for referral : Chronic Care Management   Kimberly Gross is a 68 y.o. year old female who is a primary care patient of Kimberly Gross, [place]. The CCM team was consulted for assistance with chronic disease management and care coordination needs related to DMII  Review of patient status, including review of consultants reports, relevant laboratory and other test results, and collaboration with appropriate care team members and the patient's provider was performed as part of comprehensive patient evaluation and provision of chronic care management services.     Medications: Outpatient Encounter Medications as of 07/04/2019  Medication Sig Note  . Accu-Chek Softclix Lancets lancets USE AS DIRECTED  TO CHECK BLOOD GLUCOSE   . acetaminophen (TYLENOL) 500 MG tablet Take 1,000 mg by mouth 2 (two) times a day.   . Alcohol Swabs (B-D SINGLE USE SWABS REGULAR) PADS  05/08/2015: Received from: External Pharmacy  . amLODipine (NORVASC) 10 MG tablet TAKE 1 TABLET EVERY DAY   . aspirin 81 MG tablet Take 81 mg by mouth daily.  05/08/2015: Received from: External Pharmacy Received Sig:   . atorvastatin (LIPITOR) 40 MG tablet TAKE 1 TABLET BY MOUTH EVERYDAY AT BEDTIME   . Blood Glucose Monitoring Suppl (ACCU-CHEK AVIVA PLUS) w/Device KIT Check blood sugar twice a day   . carvedilol (COREG) 3.125 MG tablet Take 1 tablet (3.125 mg total) by mouth 2 (two) times daily.   . clonazePAM (KLONOPIN) 0.5 MG tablet Take 1 tablet (0.5 mg total) by mouth 2 (two) times daily.   . divalproex (DEPAKOTE) 500 MG DR tablet 500 mg. 2 pills 2 times per day 05/08/2015: Received from: External Pharmacy  . DM-APAP-CPM (CORICIDIN HBP PO) Take by mouth as needed.   . DROPLET PEN NEEDLES 32G X 4 MM MISC USE WITH XULTOPHY ONE TIME DAILY.    . fenofibrate 160 MG tablet TAKE 1 TABLET EVERY DAY     . ferrous sulfate 325 (65 FE) MG tablet Take 325 mg by mouth daily with breakfast.   . glucose blood (ACCU-CHEK AVIVA PLUS) test strip Use as instructed   . loratadine (CLARITIN) 10 MG tablet Take 1 tablet (10 mg total) by mouth daily. (Patient taking differently: Take 10 mg by mouth as needed. )   . Multiple Vitamins-Minerals (MULTIVITAMIN ADULT PO) Use as directed 1 tablet in the mouth or throat daily. 05/08/2015: Received from: External Pharmacy Received Sig:   . vitamin B-12 (CYANOCOBALAMIN) 250 MCG tablet Take 250 mcg by mouth daily.   [name] 100-3.6 UNIT-MG/ML SOPN INJECT 20 UNITS BY SUBCUTANEOUS ROUTE EVERY DAY (Patient taking differently: Inject 30 Units into the skin daily. ) 10/20/2018: She states she needs a new Rx for insurance to cover. She also states she has hit the donut hole and cost is increasing now.    No facility-administered encounter medications on file as of 07/04/2019.     Objective:   Goals Addressed            This Visit's Progress     Patient Stated   . "I am having trouble affording my new diabetes medicine" (pt-stated)       Current Barriers:  . Financial constraints  . Reported payor restrictions on coverage  Clinical Social Work Clinical Goal(s):  Marland Kitchen Over the next 30 days, patient will work with CCM PharmD to address needs related to medication assistance (goal  re-established 07/04/19)  Interventions: Call placed to patient on 07/04/19 . Patient interviewed and appropriate assessments performed . Discussed plans with patient for ongoing care management follow up and provided patient with direct contact information for care management team  . Patient currently stable on Xutolphy 30 units daily, however insurance will no longer cover.  She reports FBG range of 90-150.  She realizes 160 is usually due to "not eating as she should", but states this is rare.  Last A1c was 6.8% and patient remains motivated to continue her efforts in controlling  A1c. . Will work with [place], Etter Sjogren to apply for financial assistance for [place] through [employer]. . Comprehensive medication review performed.  Patient tolerating medications with no issues. Marland Kitchen Application mailed to patient week of 07/04/19   Patient Self Care Activities:  . Self administers medications as prescribed . Calls pharmacy for medication refills . Calls provider office for new concerns or questions  Please see past updates related to this goal by clicking on the "Past Updates" button in the selected goal      . I would like to continue to manage my diabetes (pt-stated)       Current Barriers:  . Diabetes: T2DM; most recent A1c 6.8% on 09/01/18 (repeat A1c was missed, next PCP visit on 09/07/19)  . Current antihyperglycemic regimen: Xutolphy 30 units daily . denies hypoglycemic symptoms; denies hyperglycemic symptoms . Current meal patterns: o Patient's spouse recently passed away, therefore patient has been eating mostly foods brought from friends and family.  She is aware of how her diet should be and tries to incorporate low carb/sugar at every meal. . Current exercise: n/a . Current blood glucose readings: FBG 90-130 . Cardiovascular risk reduction: o Current hypertensive regimen: carvedilol, amlodipine o Current hyperlipidemia regimen: atorvastatin '40mg'$  daily  Pharmacist Clinical Goal(s):  Marland Kitchen Over the next 90 days, patient with work with PharmD and primary care provider to address need related to optimization of medication management of chronic conditions  Interventions: Call placed to patient on 07/04/19 . Comprehensive medication review performed, medication list updated in electronic medical record . Reviewed & discussed the following diabetes-related information with patient: o Continue checking blood sugars as directed o Follow ADA recommended "diabetes-friendly" diet  (reviewed healthy snack/food options) o Discussed insulin/GLP-1 injection  technique o Reviewed medication purpose/side effects-->patient denies adverse events  Patient Self Care Activities:  . Patient will check blood glucose daily in the AM , document, and provide at future appointments . Patient will focus on medication adherence by continuing to take medications as prescribed . Patient will take medications as prescribed . Patient will contact provider with any episodes of hypoglycemia . Patient will report any questions or concerns to provider   Please see past updates related to this goal by clicking on the "Past Updates" button in the selected goal          Plan:   The care management team will reach out to the patient again over the next 30 days.   Provider Signature Regina Eck, PharmD, BCPS Clinical Pharmacist, [facility] [place]: [phone]

## 2019-07-05 ENCOUNTER — Telehealth: Payer: Self-pay

## 2019-07-13 ENCOUNTER — Ambulatory Visit: Payer: Self-pay | Admitting: Pharmacist

## 2019-07-13 DIAGNOSIS — E119 Type 2 diabetes mellitus without complications: Secondary | ICD-10-CM

## 2019-07-13 NOTE — Progress Notes (Signed)
  Chronic Care Management   Outreach Note  07/13/2019 Name: Kimberly Gross MRN: [id number] DOB: 1950/12/31  Referred by: [name], FNP Reason for referral : Chronic Care Management   A second unsuccessful telephone outreach was attempted today. The patient was referred to the case management team for assistance with care management and care coordination.   Follow Up Plan: A HIPPA compliant phone message was left for the patient providing contact information and requesting a return call.  The care management team will reach out to the patient again over the next 30 days.   SIGNATURE Regina Eck, PharmD, BCPS Clinical Pharmacist, [facility] [place]: [phone]

## 2019-07-14 DIAGNOSIS — F250 Schizoaffective disorder, bipolar type: Secondary | ICD-10-CM | POA: Diagnosis not present

## 2019-07-15 HISTORY — PX: CATARACT EXTRACTION, BILATERAL: SHX1313

## 2019-07-17 NOTE — Progress Notes (Signed)
Cardiology Office Note:    Date:  07/18/2019   ID:  Kimberly Gross, DOB [birth year], MRN [id number]  PCP:  [name], FNP  Cardiologist:  Elouise Munroe, MD  Electrophysiologist:  None   Referring MD: [name], FNP   Chief Complaint: f/u HTN and LE edema  History of Present Illness:    Kimberly Gross is a 69 y.o. female with a hx of hypertension, diabetes, chronic kidney disease stage IV, anxiety.  She feels she is doing ok from a cardiac standpoint since she saw [place] last in June. She denies CP and headaches. Continues to have mild DOE. Denies palpitations. LE swelling remains but continuing conservative measures. Her daughters Alda Berthold and Dorena Cookey help her with her medications, and her granddaughter Marijo Sanes helps with her medicine management as well. She has a supportive family and help with her health needs.   BP has been uncontrolled at home. 160/90, 170/105 at home. Situational stressors may be contributing.   Past Medical History:  Diagnosis Date  . Anemia   . Anxiety   . Bipolar 1 disorder ([place])   . Depression   . Diabetes mellitus without complication ([place])   . Hyperlipemia   . Hypertension   . Occasional tremors     Past Surgical History:  Procedure Laterality Date  . VAGINAL HYSTERECTOMY      Current Medications: No outpatient medications have been marked as taking for the 07/18/19 encounter (Office Visit) with Elouise Munroe, MD.     Allergies:   Chlorpromazine   Social History   Socioeconomic History  . Marital status: Married    Spouse name: Not on file  . Number of children: 2  . Years of education: Not on file  . Highest education level: Not on file  Occupational History  . Occupation: retired  Tobacco Use  . Smoking status: Never Smoker  . Smokeless tobacco: Never Used  Substance and Sexual Activity  . Alcohol use: No  . Drug use: No  . Sexual activity: Not Currently  Other Topics Concern  . Not on file  Social History Narrative   Drinks 1-2 caffeine drinks a day    Social Determinants of Health   Financial Resource Strain: Low Risk   . Difficulty of Paying Living Expenses: Not very hard  Food Insecurity: No Food Insecurity  . Worried About Charity fundraiser in the Last Year: Never true  . Ran Out of Food in the Last Year: Never true  Transportation Needs: No Transportation Needs  . Lack of Transportation (Medical): No  . Lack of Transportation (Non-Medical): No  Physical Activity: Insufficiently Active  . Days of Exercise per Week: 7 days  . Minutes of Exercise per Session: 20 min  Stress: No Stress Concern Present  . Feeling of Stress : Not at all  Social Connections: Unknown  . Frequency of Communication with Friends and Family: More than three times a week  . Frequency of Social Gatherings with Friends and Family: More than three times a week  . Attends Religious Services: Not on file  . Active Member of Clubs or Organizations: Not on file  . Attends Archivist Meetings: Not on file  . Marital Status: Married     Family History: The patient's family history includes Heart attack in her father; Hypertension in her mother. There is no history of Breast cancer.  ROS:   Please see the history of present illness.    All other systems reviewed and  are negative.  EKGs/Labs/Other Studies Reviewed:    The following studies were reviewed today:  EKG:  NSR, RATE 88, RBBB, LAFB, MINIMAL CRITERIA FOR LVH, POSSIBLE SEPTAL INFARCT  Recent Labs: 09/01/2018: ALT 27; BUN 42; Creatinine, Ser 2.29; Potassium 4.6; Sodium 142; TSH 1.610  Recent Lipid Panel    Component Value Date/Time   CHOL 183 09/01/2018 1549   TRIG 266 (H) 09/01/2018 1549   HDL 49 09/01/2018 1549   CHOLHDL 3.7 09/01/2018 1549   CHOLHDL 2.7 08/23/2010 2238   VLDL 12 08/23/2010 2238   LDLCALC 81 09/01/2018 1549    Physical Exam:    VS:  BP (!) 176/102   Pulse 88   Temp (!) 96.8 F (36 C)   Ht 5\' 3"  (1.6 m)   Wt 262 lb  (118.8 kg)   SpO2 97%   BMI 46.41 kg/m     Wt Readings from Last 5 Encounters:  07/18/19 262 lb (118.8 kg)  03/03/19 265 lb (120.2 kg)  12/27/18 262 lb (118.8 kg)  12/08/18 260 lb (117.9 kg)  12/01/18 256 lb (116.1 kg)     Constitutional: No acute distress Eyes: sclera non-icteric, normal conjunctiva and lids ENMT: normal dentition, moist mucous membranes Cardiovascular: regular rhythm, normal rate, no murmurs. S1 and S2 normal. Radial pulses normal bilaterally.  Respiratory: clear to auscultation bilaterally GI : normal bowel sounds, soft and nontender. No distention.   MSK: extremities warm, well perfused. No edema.  NEURO: grossly nonfocal exam, moves all extremities. PSYCH: alert and oriented x 3, normal mood and affect.    ASSESSMENT:    1. Essential hypertension   2. Hyperlipidemia, unspecified hyperlipidemia type   3. Type 2 diabetes mellitus without complication, without long-term current use of insulin ([place])   4. OSA (obstructive sleep apnea)   5. Anxiety state   6. Abnormal electrocardiogram (ECG) (EKG)   7. CKD (chronic kidney disease) stage 4, GFR 15-29 ml/min (HCC)    PLAN:    HTN - will add hydralazine 25 mg TID to regimen today, patient says she will not have trouble with compliance. Continue amlodipine 10 mg daily, carvediolol 3.125 mg BID, lasix 40 mg MWF. Recheck with me in 2-3 weeks to continue to titrate medications.  HLD - continues on statin and fibrate per primary, would use this combination with caution, and recommend dietary and lifestyle modification for improvement in triglycerides (reduced refined carbohydrate consumption, reduce pure sugar intake, avoid alcohol consumption).  DM - per primary  Abnormal EKG - monitor. Echo showed moderate LVH likely due to longstanding HTN.    Total time of encounter: 35 minutes total time of encounter, including 25 minutes spent in face-to-face patient care. This time includes coordination of care and  counseling regarding HTN and therapy. Remainder of non-face-to-face time involved reviewing chart documents/testing relevant to the patient encounter and documentation in the medical record.  Cherlynn Kaiser, MD [place]  [facility]   Medication Adjustments/Labs and Tests Ordered: Current medicines are reviewed at length with the patient today.  Concerns regarding medicines are outlined above.  No orders of the defined types were placed in this encounter.  Meds ordered this encounter  Medications  . hydrALAZINE (APRESOLINE) 25 MG tablet    Sig: Take 1 tablet (25 mg total) by mouth 3 (three) times daily.    Dispense:  270 tablet    Refill:  3    Patient Instructions  Medication Instructions:  Your physician has recommended you make the following change in  your medication:   START HYDRALAZINE 25 MG. ONE TABLET BY MOUTH THREE TIMES A DAY  *If you need a refill on your cardiac medications before your next appointment, please call your pharmacy*  Lab Work: NONE If you have labs (blood work) drawn today and your tests are completely normal, you will receive your results only by: Marland Kitchen MyChart Message (if you have MyChart) OR . A paper copy in the mail If you have any lab test that is abnormal or we need to change your treatment, we will call you to review the results.  Testing/Procedures: NONE  Follow-Up: At [facility], you and your health needs are our priority.  As part of our continuing mission to provide you with exceptional heart care, we have created designated Provider Care Teams.  These Care Teams include your primary Cardiologist (physician) and Advanced Practice Providers (APPs -  Physician Assistants and Nurse Practitioners) who all work together to provide you with the care you need, when you need it.  Your next appointment:   August 04, 2019  The format for your next appointment:   In Person  Provider:   Cherlynn Kaiser, MD

## 2019-07-18 ENCOUNTER — Other Ambulatory Visit: Payer: Self-pay

## 2019-07-18 ENCOUNTER — Ambulatory Visit: Payer: Medicare HMO | Admitting: Internal Medicine

## 2019-07-18 ENCOUNTER — Encounter: Payer: Self-pay | Admitting: Internal Medicine

## 2019-07-18 VITALS — BP 176/102 | HR 88 | Temp 96.8°F | Ht 63.0 in | Wt 262.0 lb

## 2019-07-18 DIAGNOSIS — F411 Generalized anxiety disorder: Secondary | ICD-10-CM | POA: Diagnosis not present

## 2019-07-18 DIAGNOSIS — I10 Essential (primary) hypertension: Secondary | ICD-10-CM | POA: Diagnosis not present

## 2019-07-18 DIAGNOSIS — G4733 Obstructive sleep apnea (adult) (pediatric): Secondary | ICD-10-CM

## 2019-07-18 DIAGNOSIS — E785 Hyperlipidemia, unspecified: Secondary | ICD-10-CM

## 2019-07-18 DIAGNOSIS — N184 Chronic kidney disease, stage 4 (severe): Secondary | ICD-10-CM

## 2019-07-18 DIAGNOSIS — E119 Type 2 diabetes mellitus without complications: Secondary | ICD-10-CM

## 2019-07-18 DIAGNOSIS — R9431 Abnormal electrocardiogram [ECG] [EKG]: Secondary | ICD-10-CM | POA: Diagnosis not present

## 2019-07-18 DIAGNOSIS — Z79899 Other long term (current) drug therapy: Secondary | ICD-10-CM | POA: Diagnosis not present

## 2019-07-18 MED ORDER — HYDRALAZINE HCL 25 MG PO TABS: 25.0000 mg | ORAL_TABLET | Freq: Three times a day (TID) | ORAL | 3 refills | Status: DC

## 2019-07-18 NOTE — Patient Instructions (Addendum)
Medication Instructions:  Your physician has recommended you make the following change in your medication:   START HYDRALAZINE 25 MG. ONE TABLET BY MOUTH THREE TIMES A DAY  *If you need a refill on your cardiac medications before your next appointment, please call your pharmacy*  Lab Work: NONE If you have labs (blood work) drawn today and your tests are completely normal, you will receive your results only by: Marland Kitchen MyChart Message (if you have MyChart) OR . A paper copy in the mail If you have any lab test that is abnormal or we need to change your treatment, we will call you to review the results.  Testing/Procedures: NONE  Follow-Up: At [facility], you and your health needs are our priority.  As part of our continuing mission to provide you with exceptional heart care, we have created designated Provider Care Teams.  These Care Teams include your primary Cardiologist (physician) and Advanced Practice Providers (APPs -  Physician Assistants and Nurse Practitioners) who all work together to provide you with the care you need, when you need it.  Your next appointment:   August 04, 2019  The format for your next appointment:   In Person  Provider:   Cherlynn Kaiser, MD

## 2019-07-26 ENCOUNTER — Other Ambulatory Visit: Payer: Self-pay

## 2019-07-26 DIAGNOSIS — E785 Hyperlipidemia, unspecified: Secondary | ICD-10-CM

## 2019-07-26 DIAGNOSIS — I10 Essential (primary) hypertension: Secondary | ICD-10-CM

## 2019-07-26 DIAGNOSIS — G4733 Obstructive sleep apnea (adult) (pediatric): Secondary | ICD-10-CM

## 2019-07-26 MED ORDER — FENOFIBRATE 160 MG PO TABS: 160.0000 mg | ORAL_TABLET | Freq: Every day | ORAL | 0 refills | Status: DC

## 2019-07-26 MED ORDER — DROPLET PEN NEEDLES 32G X 4 MM MISC: 2 refills | Status: DC

## 2019-07-26 MED ORDER — AMLODIPINE BESYLATE 10 MG PO TABS: 10.0000 mg | ORAL_TABLET | Freq: Every day | ORAL | 0 refills | Status: DC

## 2019-08-01 ENCOUNTER — Ambulatory Visit: Payer: Self-pay | Admitting: Pharmacist

## 2019-08-01 ENCOUNTER — Other Ambulatory Visit: Payer: Self-pay

## 2019-08-01 MED ORDER — BD SWAB SINGLE USE REGULAR PADS: MEDICATED_PAD | 5 refills | Status: DC

## 2019-08-01 NOTE — Progress Notes (Signed)
  Chronic Care Management   Outreach Note  08/01/2019 Name: Kimberly Gross MRN: [id number] DOB: 11-Apr-1951  Referred by: [name], FNP Reason for referral : Chronic Care Management   A second unsuccessful telephone outreach was attempted today. The patient was referred to the case management team for assistance with care management and care coordination.   Follow Up Plan: A HIPPA compliant phone message was left for the patient providing contact information and requesting a return call.  The care management team will reach out to the patient again over the next 10-14 days.   SIGNATURE Regina Eck, PharmD, BCPS Clinical Pharmacist, [facility] [place]: [phone]

## 2019-08-04 ENCOUNTER — Ambulatory Visit (INDEPENDENT_AMBULATORY_CARE_PROVIDER_SITE_OTHER): Payer: Medicare HMO | Admitting: Internal Medicine

## 2019-08-04 ENCOUNTER — Other Ambulatory Visit: Payer: Self-pay

## 2019-08-04 ENCOUNTER — Encounter: Payer: Self-pay | Admitting: Internal Medicine

## 2019-08-04 VITALS — BP 152/85 | HR 93 | Temp 96.6°F | Ht 63.0 in | Wt 274.6 lb

## 2019-08-04 DIAGNOSIS — N184 Chronic kidney disease, stage 4 (severe): Secondary | ICD-10-CM

## 2019-08-04 DIAGNOSIS — G4733 Obstructive sleep apnea (adult) (pediatric): Secondary | ICD-10-CM

## 2019-08-04 DIAGNOSIS — F411 Generalized anxiety disorder: Secondary | ICD-10-CM

## 2019-08-04 DIAGNOSIS — I10 Essential (primary) hypertension: Secondary | ICD-10-CM

## 2019-08-04 DIAGNOSIS — E119 Type 2 diabetes mellitus without complications: Secondary | ICD-10-CM | POA: Diagnosis not present

## 2019-08-04 DIAGNOSIS — E785 Hyperlipidemia, unspecified: Secondary | ICD-10-CM

## 2019-08-04 MED ORDER — HYDRALAZINE HCL 50 MG PO TABS: 50.0000 mg | ORAL_TABLET | Freq: Three times a day (TID) | ORAL | 1 refills | Status: DC

## 2019-08-04 NOTE — Patient Instructions (Signed)
Medication Instructions:  INCREASE the Hydralazine to 50 mg three times daily  *If you need a refill on your cardiac medications before your next appointment, please call your pharmacy*  Lab Work: None ordered If you have labs (blood work) drawn today and your tests are completely normal, you will receive your results only by: Marland Kitchen MyChart Message (if you have MyChart) OR . A paper copy in the mail If you have any lab test that is abnormal or we need to change your treatment, we will call you to review the results.  Testing/Procedures: None ordered  Follow-Up: At [facility], you and your health needs are our priority.  As part of our continuing mission to provide you with exceptional heart care, we have created designated Provider Care Teams.  These Care Teams include your primary Cardiologist (physician) and Advanced Practice Providers (APPs -  Physician Assistants and Nurse Practitioners) who all work together to provide you with the care you need, when you need it.  Your next appointment:   6 week(s)  The format for your next appointment:   In Person  Provider:   You may see Elouise Munroe, MD or one of the following Advanced Practice Providers on your designated Care Team:    Rosaria Ferries, PA-C  Jory Sims, DNP, ANP  Cadence Kathlen Mody, NP

## 2019-08-04 NOTE — Progress Notes (Signed)
Cardiology Office Note:    Date:  08/04/2019   ID:  Kimberly Gross, Kimberly Gross June 20, 1951, MRN [id number]  PCP:  [name], FNP  Cardiologist:  Elouise Munroe, MD  Electrophysiologist:  None   Referring MD: [name], FNP   Chief Complaint: f/u HTN  History of Present Illness:    Kimberly Gross is a 69 y.o. female with a hx of hx of hypertension, diabetes, chronic kidney disease stage IV, anxiety.  Follow up today for HTN, uncontrolled.  Added hydralazine 25 mg TID at last appt. Continued amlodipine 10 mg daily, carvediolol 3.125 mg BID, lasix 40 mg MWF.  Left arm feeling much better after hand trauma from grandson's high five.   BP today 152/85 BP at home 140s-150s.   Past Medical History:  Diagnosis Date  . Anemia   . Anxiety   . Bipolar 1 disorder ([place])   . Depression   . Diabetes mellitus without complication ([place])   . Hyperlipemia   . Hypertension   . Occasional tremors     Past Surgical History:  Procedure Laterality Date  . VAGINAL HYSTERECTOMY      Current Medications: Current Meds  Medication Sig  . Accu-Chek Softclix Lancets lancets USE AS DIRECTED  TO CHECK BLOOD GLUCOSE  . acetaminophen (TYLENOL) 500 MG tablet Take 1,000 mg by mouth 2 (two) times a day.  . Alcohol Swabs (B-D SINGLE USE SWABS REGULAR) PADS Use as directed to check blood sugars twice daily  . amLODipine (NORVASC) 10 MG tablet Take 1 tablet (10 mg total) by mouth daily.  Marland Kitchen aspirin 81 MG tablet Take 81 mg by mouth daily.   Marland Kitchen atorvastatin (LIPITOR) 40 MG tablet TAKE 1 TABLET BY MOUTH EVERYDAY AT BEDTIME  . Blood Glucose Monitoring Suppl (ACCU-CHEK AVIVA PLUS) w/Device KIT Check blood sugar twice a day  . clonazePAM (KLONOPIN) 0.5 MG tablet Take 1 tablet (0.5 mg total) by mouth 2 (two) times daily.  . divalproex (DEPAKOTE) 500 MG DR tablet 500 mg. 2 pills 2 times per day  . DM-APAP-CPM (CORICIDIN HBP PO) Take by mouth as needed.  . fenofibrate 160 MG tablet Take 1 tablet (160 mg  total) by mouth daily.  . ferrous sulfate 325 (65 FE) MG tablet Take 325 mg by mouth daily with breakfast.  . glucose blood (ACCU-CHEK AVIVA PLUS) test strip Use as instructed  . hydrALAZINE (APRESOLINE) 50 MG tablet Take 1 tablet (50 mg total) by mouth 3 (three) times daily.  . Insulin Pen Needle (DROPLET PEN NEEDLES) 32G X 4 MM MISC USE WITH XULTOPHY ONE TIME DAILY.  Marland Kitchen loratadine (CLARITIN) 10 MG tablet Take 1 tablet (10 mg total) by mouth daily. (Patient taking differently: Take 10 mg by mouth as needed. )  . Multiple Vitamins-Minerals (MULTIVITAMIN ADULT PO) Use as directed 1 tablet in the mouth or throat daily.  . vitamin B-12 (CYANOCOBALAMIN) 250 MCG tablet Take 250 mcg by mouth daily.  Kimberly Gross 100-3.6 UNIT-MG/ML SOPN INJECT 20 UNITS BY SUBCUTANEOUS ROUTE EVERY DAY (Patient taking differently: Inject 30 Units into the skin daily. )  . [DISCONTINUED] hydrALAZINE (APRESOLINE) 25 MG tablet Take 1 tablet (25 mg total) by mouth 3 (three) times daily.     Allergies:   Chlorpromazine   Social History   Socioeconomic History  . Marital status: Married    Spouse name: Not on file  . Number of children: 2  . Years of education: Not on file  . Highest education level: Not on file  Occupational History  . Occupation: retired  Tobacco Use  . Smoking status: Never Smoker  . Smokeless tobacco: Never Used  Substance and Sexual Activity  . Alcohol use: No  . Drug use: No  . Sexual activity: Not Currently  Other Topics Concern  . Not on file  Social History Narrative   Drinks 1-2 caffeine drinks a day    Social Determinants of Health   Financial Resource Strain: Low Risk   . Difficulty of Paying Living Expenses: Not very hard  Food Insecurity: No Food Insecurity  . Worried About Charity fundraiser in the Last Year: Never true  . Ran Out of Food in the Last Year: Never true  Transportation Needs: No Transportation Needs  . Lack of Transportation (Medical): No  . Lack of  Transportation (Non-Medical): No  Physical Activity: Insufficiently Active  . Days of Exercise per Week: 7 days  . Minutes of Exercise per Session: 20 min  Stress: No Stress Concern Present  . Feeling of Stress : Not at all  Social Connections: Unknown  . Frequency of Communication with Friends and Family: More than three times a week  . Frequency of Social Gatherings with Friends and Family: More than three times a week  . Attends Religious Services: Not on file  . Active Member of Clubs or Organizations: Not on file  . Attends Archivist Meetings: Not on file  . Marital Status: Married     Family History: The patient's family history includes Heart attack in her father; Hypertension in her mother. There is no history of Breast cancer.  ROS:   Please see the history of present illness.    All other systems reviewed and are negative.  EKGs/Labs/Other Studies Reviewed:    The following studies were reviewed today:  EKG:  Not performed today  Recent Labs: 09/01/2018: ALT 27; BUN 42; Creatinine, Ser 2.29; Potassium 4.6; Sodium 142; TSH 1.610  Recent Lipid Panel    Component Value Date/Time   CHOL 183 09/01/2018 1549   TRIG 266 (H) 09/01/2018 1549   HDL 49 09/01/2018 1549   CHOLHDL 3.7 09/01/2018 1549   CHOLHDL 2.7 08/23/2010 2238   VLDL 12 08/23/2010 2238   LDLCALC 81 09/01/2018 1549    Physical Exam:    VS:  BP (!) 152/85   Pulse 93   Temp (!) 96.6 F (35.9 C)   Ht 5' 3" (1.6 m)   Wt 274 lb 9.6 oz (124.6 kg)   SpO2 97%   BMI 48.64 kg/m     Wt Readings from Last 5 Encounters:  08/04/19 274 lb 9.6 oz (124.6 kg)  07/18/19 262 lb (118.8 kg)  03/03/19 265 lb (120.2 kg)  12/27/18 262 lb (118.8 kg)  12/08/18 260 lb (117.9 kg)     Constitutional: No acute distress Eyes: sclera non-icteric, normal conjunctiva and lids ENMT: normal dentition, moist mucous membranes Cardiovascular: regular rhythm, normal rate, no murmurs. S1 and S2 normal. Radial pulses  normal bilaterally. No jugular venous distention.  Respiratory: clear to auscultation bilaterally GI : normal bowel sounds, soft and nontender. No distention.   MSK: extremities warm, well perfused. No edema.  NEURO: grossly nonfocal exam, moves all extremities. PSYCH: alert and oriented x 3, normal mood and affect.   ASSESSMENT:    1. Essential hypertension   2. Hyperlipidemia, unspecified hyperlipidemia type   3. OSA (obstructive sleep apnea)   4. Type 2 diabetes mellitus without complication, without long-term current use of insulin ([place])  5. Anxiety state   6. CKD (chronic kidney disease) stage 4, GFR 15-29 ml/min (HCC)    PLAN:    HTN, suboptimal control - we will increase hydralazine to 50 mg TID today. She has tolerated the initiation of hydralazine well.   HLD- LDL <100, reasonable to continue atorvastatin 40 mg daily. Cautious continuation of statin and fibrate in combination, consider evaluation with lipid clinic for optimization of medical therapy. Encouraged dietary modifications.   Cherlynn Kaiser, MD [place]  [facility]   Medication Adjustments/Labs and Tests Ordered: Current medicines are reviewed at length with the patient today.  Concerns regarding medicines are outlined above.  No orders of the defined types were placed in this encounter.  Meds ordered this encounter  Medications  . hydrALAZINE (APRESOLINE) 50 MG tablet    Sig: Take 1 tablet (50 mg total) by mouth 3 (three) times daily.    Dispense:  270 tablet    Refill:  1    Patient Instructions  Medication Instructions:  INCREASE the Hydralazine to 50 mg three times daily  *If you need a refill on your cardiac medications before your next appointment, please call your pharmacy*  Lab Work: None ordered If you have labs (blood work) drawn today and your tests are completely normal, you will receive your results only by: Marland Kitchen MyChart Message (if you have MyChart) OR . A paper copy in the  mail If you have any lab test that is abnormal or we need to change your treatment, we will call you to review the results.  Testing/Procedures: None ordered  Follow-Up: At [facility], you and your health needs are our priority.  As part of our continuing mission to provide you with exceptional heart care, we have created designated Provider Care Teams.  These Care Teams include your primary Cardiologist (physician) and Advanced Practice Providers (APPs -  Physician Assistants and Nurse Practitioners) who all work together to provide you with the care you need, when you need it.  Your next appointment:   6 week(s)  The format for your next appointment:   In Person  Provider:   You may see Elouise Munroe, MD or one of the following Advanced Practice Providers on your designated Care Team:    Rosaria Ferries, PA-C  Jory Sims, DNP, ANP  Cadence Kathlen Mody, NP

## 2019-08-10 ENCOUNTER — Other Ambulatory Visit: Payer: Self-pay | Admitting: Pharmacy Technician

## 2019-08-10 NOTE — Patient Outreach (Signed)
[facility]) Care Management  08/10/2019  NIYLA ZAMOR [birth year] [id number]   Received patient portion(s) of patient assistance application(s) for [employer]. Faxed completed application and required documents into [employer].  Will follow up with company(ies) in 7-10 business days to check status of application(s).  Maud Deed Chana Bode [employer] Certified Pharmacy Technician [place] Management Direct Dial:[phone]

## 2019-08-16 ENCOUNTER — Telehealth: Payer: Self-pay | Admitting: Licensed Clinical Social Worker

## 2019-08-16 NOTE — Telephone Encounter (Signed)
CSW referred to assist patient with community resources to assist patient with recent custody of her 4 granddaughters. CSW attempted patient's number although not in service. CSW contacted patient's daughter Burna Mortimer who states patient's phone is not working and she will try and obtain a working number for patient and return call to CSW. CSW will await return call Raquel Sarna, [place] Mike, [place]

## 2019-08-18 ENCOUNTER — Telehealth: Payer: Self-pay | Admitting: Licensed Clinical Social Worker

## 2019-08-18 NOTE — Telephone Encounter (Signed)
CSW spoke with patient and shared bunk bed obtained and will be delivered to patients home next week. Patient grateful for the support and so appreciative of the furniture. CSW also encouraged patient and daughter to apply for food stamps to assist with food needs. Patient will follow up and verbalizes understanding. CSW left number for any future needs. Raquel Sarna, [place], [place]

## 2019-08-18 NOTE — Telephone Encounter (Signed)
CSW spoke with patient on Tuesday who shared need for beds. Patient states her two granddaughters ages 69yo and 50yo recently moved in with her and have been sleeping in her queen bed with patient. Patient reports that it is tight and needs beds for the girls. Patient shared that she has little room in her home and would benefit from twin bunk beds for space savings. CSW will explore options and return call to patient. Raquel Sarna, [place], [place]

## 2019-08-26 DIAGNOSIS — G4733 Obstructive sleep apnea (adult) (pediatric): Secondary | ICD-10-CM | POA: Diagnosis not present

## 2019-08-29 ENCOUNTER — Telehealth: Payer: Self-pay | Admitting: Licensed Clinical Social Worker

## 2019-08-29 NOTE — Telephone Encounter (Signed)
CSW attempted to follow up via phone with patient regarding recent assistance with beds for her granddaughters. CSW unable to leave message as no voicemail set up. Raquel Sarna, [place], [place]

## 2019-08-31 ENCOUNTER — Ambulatory Visit: Payer: Medicare HMO | Admitting: Adult Health

## 2019-08-31 ENCOUNTER — Encounter: Payer: Self-pay | Admitting: Adult Health

## 2019-09-05 ENCOUNTER — Other Ambulatory Visit: Payer: Self-pay | Admitting: Nurse Practitioner

## 2019-09-07 ENCOUNTER — Encounter: Payer: Self-pay | Admitting: Nurse Practitioner

## 2019-09-07 ENCOUNTER — Other Ambulatory Visit: Payer: Self-pay

## 2019-09-07 ENCOUNTER — Ambulatory Visit (INDEPENDENT_AMBULATORY_CARE_PROVIDER_SITE_OTHER): Payer: Medicare HMO | Admitting: Nurse Practitioner

## 2019-09-07 ENCOUNTER — Ambulatory Visit (INDEPENDENT_AMBULATORY_CARE_PROVIDER_SITE_OTHER): Payer: Medicare HMO | Admitting: Pharmacist

## 2019-09-07 ENCOUNTER — Ambulatory Visit (INDEPENDENT_AMBULATORY_CARE_PROVIDER_SITE_OTHER): Payer: Medicare HMO

## 2019-09-07 VITALS — BP 144/78 | HR 105 | Temp 98.5°F | Ht 63.0 in | Wt 274.0 lb

## 2019-09-07 VITALS — BP 144/78 | HR 105 | Temp 98.5°F | Ht 63.0 in | Wt 274.6 lb

## 2019-09-07 DIAGNOSIS — Z23 Encounter for immunization: Secondary | ICD-10-CM | POA: Diagnosis not present

## 2019-09-07 DIAGNOSIS — Z0000 Encounter for general adult medical examination without abnormal findings: Secondary | ICD-10-CM

## 2019-09-07 DIAGNOSIS — F3132 Bipolar disorder, current episode depressed, moderate: Secondary | ICD-10-CM | POA: Diagnosis not present

## 2019-09-07 DIAGNOSIS — I10 Essential (primary) hypertension: Secondary | ICD-10-CM

## 2019-09-07 DIAGNOSIS — E782 Mixed hyperlipidemia: Secondary | ICD-10-CM

## 2019-09-07 DIAGNOSIS — E119 Type 2 diabetes mellitus without complications: Secondary | ICD-10-CM | POA: Diagnosis not present

## 2019-09-07 DIAGNOSIS — Z6841 Body Mass Index (BMI) 40.0 and over, adult: Secondary | ICD-10-CM | POA: Diagnosis not present

## 2019-09-07 DIAGNOSIS — E559 Vitamin D deficiency, unspecified: Secondary | ICD-10-CM | POA: Diagnosis not present

## 2019-09-07 DIAGNOSIS — E785 Hyperlipidemia, unspecified: Secondary | ICD-10-CM | POA: Diagnosis not present

## 2019-09-07 DIAGNOSIS — E669 Obesity, unspecified: Secondary | ICD-10-CM

## 2019-09-07 LAB — POCT URINALYSIS DIPSTICK
Bilirubin, UA: NEGATIVE
Blood, UA: NEGATIVE
Glucose, UA: NEGATIVE
Ketones, UA: NEGATIVE
Leukocytes, UA: NEGATIVE
Nitrite, UA: NEGATIVE
Protein, UA: NEGATIVE
Spec Grav, UA: 1.015 (ref 1.010–1.025)
Urobilinogen, UA: 0.2 E.U./dL
pH, UA: 5 (ref 5.0–8.0)

## 2019-09-07 LAB — POCT UA - MICROALBUMIN
Creatinine, POC: 50 mg/dL
Microalbumin Ur, POC: 30 mg/L

## 2019-09-07 MED ORDER — ATORVASTATIN CALCIUM 40 MG PO TABS: ORAL_TABLET | ORAL | 0 refills | Status: DC

## 2019-09-07 MED ORDER — PREVNAR 13 IM SUSP: 0.5000 mL | INTRAMUSCULAR | 0 refills | Status: AC

## 2019-09-07 MED ORDER — HYDRALAZINE HCL 50 MG PO TABS: 50.0000 mg | ORAL_TABLET | Freq: Three times a day (TID) | ORAL | 1 refills | Status: DC

## 2019-09-07 NOTE — Progress Notes (Signed)
This visit occurred during the SARS-CoV-2 public health emergency.  Safety protocols were in place, including screening questions prior to the visit, additional usage of staff PPE, and extensive cleaning of exam room while observing appropriate contact time as indicated for disinfecting solutions.  Subjective:   Kimberly Gross is a 69 y.o. female who presents for Medicare Annual (Subsequent) preventive examination.  Review of Systems:  n/a Cardiac Risk Factors include: advanced age (>56mn, >>27women);diabetes mellitus;dyslipidemia;hypertension;obesity (BMI >30kg/m2);sedentary lifestyle     Objective:     Vitals: BP (!) 144/78 (BP Location: Left Arm, Patient Position: Sitting, Cuff Size: Large)   Pulse (!) 105   Temp 98.5 F (36.9 C) (Oral)   Ht '5\' 3"'$  (1.6 m)   Wt 274 lb 9.6 oz (124.6 kg)   SpO2 96%   BMI 48.64 kg/m   Body mass index is 48.64 kg/m.  Advanced Directives 09/07/2019 09/01/2018 02/27/2016  Does Patient Have a Medical Advance Directive? No No Yes  Does patient want to make changes to medical advance directive? - - No - Patient declined  Would patient like information on creating a medical advance directive? - No - Patient declined -    Tobacco Social History   Tobacco Use  Smoking Status Never Smoker  Smokeless Tobacco Never Used     Counseling given: Not Answered   Clinical Intake:  Pre-visit preparation completed: Yes  Pain : No/denies pain     Nutritional Status: BMI > 30  Obese Nutritional Risks: None Diabetes: Yes  How often do you need to have someone help you when you read instructions, pamphlets, or other written materials from your doctor or pharmacy?: 1 - Never What is the last grade level you completed in school?: 1 year college  Interpreter Needed?: No  Information entered by :: NAllen LPN  Past Medical History:  Diagnosis Date  . Anemia   . Anxiety   . Bipolar 1 disorder ([facility]   . Depression   . Diabetes mellitus without  complication ([facility]   . Hyperlipemia   . Hypertension   . Occasional tremors    Past Surgical History:  Procedure Laterality Date  . VAGINAL HYSTERECTOMY     Family History  Problem Relation Age of Onset  . Hypertension Mother   . Heart attack Father   . Breast cancer Neg Hx    Social History   Socioeconomic History  . Marital status: Widowed    Spouse name: Not on file  . Number of children: 2  . Years of education: Not on file  . Highest education level: Not on file  Occupational History  . Occupation: retired  Tobacco Use  . Smoking status: Never Smoker  . Smokeless tobacco: Never Used  Substance and Sexual Activity  . Alcohol use: No  . Drug use: No  . Sexual activity: Not Currently  Other Topics Concern  . Not on file  Social History Narrative   Drinks 1-2 caffeine drinks a day    Social Determinants of Health   Financial Resource Strain: Low Risk   . Difficulty of Paying Living Expenses: Not hard at all  Food Insecurity: No Food Insecurity  . Worried About RCharity fundraiserin the Last Year: Never true  . Ran Out of Food in the Last Year: Never true  Transportation Needs: No Transportation Needs  . Lack of Transportation (Medical): No  . Lack of Transportation (Non-Medical): No  Physical Activity: Inactive  . Days of Exercise per Week: 0  days  . Minutes of Exercise per Session: 0 min  Stress: No Stress Concern Present  . Feeling of Stress : Not at all  Social Connections: Unknown  . Frequency of Communication with Friends and Family: More than three times a week  . Frequency of Social Gatherings with Friends and Family: More than three times a week  . Attends Religious Services: Not on file  . Active Member of Clubs or Organizations: Not on file  . Attends Archivist Meetings: Not on file  . Marital Status: Married    Outpatient Encounter Medications as of 09/07/2019  Medication Sig  . Accu-Chek Softclix Lancets lancets USE AS DIRECTED   TO CHECK BLOOD GLUCOSE  . acetaminophen (TYLENOL) 500 MG tablet Take 1,000 mg by mouth 2 (two) times a day.  . Alcohol Swabs (B-D SINGLE USE SWABS REGULAR) PADS Use as directed to check blood sugars twice daily  . amLODipine (NORVASC) 10 MG tablet Take 1 tablet (10 mg total) by mouth daily.  Marland Kitchen aspirin 81 MG tablet Take 81 mg by mouth daily.   Marland Kitchen atorvastatin (LIPITOR) 40 MG tablet TAKE 1 TABLET BY MOUTH EVERYDAY AT BEDTIME  . Blood Glucose Monitoring Suppl (ACCU-CHEK AVIVA PLUS) w/Device KIT Check blood sugar twice a day  . clonazePAM (KLONOPIN) 0.5 MG tablet Take 1 tablet (0.5 mg total) by mouth 2 (two) times daily.  . divalproex (DEPAKOTE) 500 MG DR tablet 500 mg. 2 pills 2 times per day  . DM-APAP-CPM (CORICIDIN HBP PO) Take by mouth as needed.  . fenofibrate 160 MG tablet Take 1 tablet (160 mg total) by mouth daily.  . ferrous sulfate 325 (65 FE) MG tablet Take 325 mg by mouth daily with breakfast.  . glucose blood (ACCU-CHEK AVIVA PLUS) test strip Use as instructed  . hydrALAZINE (APRESOLINE) 50 MG tablet Take 1 tablet (50 mg total) by mouth 3 (three) times daily.  . Insulin Pen Needle (DROPLET PEN NEEDLES) 32G X 4 MM MISC USE WITH XULTOPHY ONE TIME DAILY.  Marland Kitchen loratadine (CLARITIN) 10 MG tablet Take 1 tablet (10 mg total) by mouth daily. (Patient taking differently: Take 10 mg by mouth as needed. )  . Multiple Vitamins-Minerals (MULTIVITAMIN ADULT PO) Use as directed 1 tablet in the mouth or throat daily.  . vitamin B-12 (CYANOCOBALAMIN) 250 MCG tablet Take 250 mcg by mouth daily.  [name] 100-3.6 UNIT-MG/ML SOPN INJECT 20 UNITS BY SUBCUTANEOUS ROUTE EVERY DAY (Patient taking differently: Inject 30 Units into the skin daily. )  . carvedilol (COREG) 3.125 MG tablet Take 1 tablet (3.125 mg total) by mouth 2 (two) times daily.  . pneumococcal 13-valent conjugate vaccine (PREVNAR 13) SUSP injection Inject 0.5 mLs into the muscle tomorrow at 10 am for 1 dose.   No facility-administered  encounter medications on file as of 09/07/2019.    Activities of Daily Living In your present state of health, do you have any difficulty performing the following activities: 09/07/2019  Hearing? N  Vision? N  Difficulty concentrating or making decisions? N  Walking or climbing stairs? N  Dressing or bathing? N  Doing errands, shopping? N  Preparing Food and eating ? N  Using the Toilet? N  In the past six months, have you accidently leaked urine? Y  Comment wears depends  Do you have problems with loss of bowel control? N  Managing your Medications? N  Managing your Finances? N  Housekeeping or managing your Housekeeping? Y  Comment has some assistance  Some recent  data might be hidden    Patient Care Team: [name], FNP as PCP - General (General Practice) Elouise Munroe, MD as PCP - Cardiology (Cardiology) Norma Fredrickson, MD as Consulting Physician (Psychiatry) Daneen Schick as Social Worker [place], Claudette Stapler, RN as Case Manager [place], Royce Macadamia, [facility] (Pharmacist) Adaline Sill, CPhT as [place] Management (Pharmacy Technician)    Assessment:   This is a routine wellness examination for [place].  Exercise Activities and Dietary recommendations Current Exercise Habits: The patient does not participate in regular exercise at present  Goals    . "I am having trouble affording my new diabetes medicine" (pt-stated)     Current Barriers:  . Financial constraints  . Reported payor restrictions on coverage  Clinical Social Work Clinical Goal(s):  Marland Kitchen Over the next 30 days, patient will work with CCM PharmD to address needs related to medication assistance (goal re-established 07/04/19)  Interventions: Call placed to patient on 07/04/19 . Patient interviewed and appropriate assessments performed . Discussed plans with patient for ongoing care management follow up and provided patient with direct contact information for care management team   . Patient currently stable on Xutolphy 30 units daily, however insurance will no longer cover.  She reports FBG range of 90-150.  She realizes 160 is usually due to "not eating as she should", but states this is rare.  Last A1c was 6.8% and patient remains motivated to continue her efforts in controlling A1c. . Will work with [place], Etter Sjogren to apply for financial assistance for [place] through [employer]. . Comprehensive medication review performed.  Patient tolerating medications with no issues. Marland Kitchen Application mailed to patient week of 07/04/19   Patient Self Care Activities:  . Self administers medications as prescribed . Calls pharmacy for medication refills . Calls provider office for new concerns or questions  Please see past updates related to this goal by clicking on the "Past Updates" button in the selected goal      . "I check my blood sugar twice a day" (pt-stated)      Current Barriers:   Knowledge Deficits related to disease process and Self-health management of Diabetes Mellitus   Nurse Case Manager Clinical Goal(s):   Over the next 30 days, patient will verbalize basic understanding of diabetes  disease process and self health management plan as evidenced by patient will verbalize 100% adherence to all ADA recommendations.  Goal Met  Over the next 90 days, patient will maintain or achieve an A1C level of <7.0.    CCM RN CM Interventions:  02/14/19 completed call with patient    Evaluation of current treatment plan related to diabetes and patient's adherence to plan as established by provider  Reviewed and discussed patient's current A1C of 6.8; discussed target A1C to maintain <7.0; discussed fasting blood work may be ordered including an A1C at next scheduled visit set for 03/03/19  Assessed for adherence to following ADA recommendations for Meal planning and monitoring preprandial CBG's - patient reports 100% adherence  Reviewed medications with patient  and discussed effectivenss from Xultrophy AEB lowering BG levels to acceptable ranges - patient reports her BG average is between 95-160's  Discussed plans with patient for ongoing care management follow up and provided patient with direct contact information for care management team  Reviewed scheduled/upcoming provider appointments includiing; discussed next follow up with provider [name], [place] set for 03/03/19 @ 10:15 am and scheduled bilateral 3D mammography set for 03/09/19 @ 8:40  AM  Reinforced importance to patient, providing education and rationale, to check cbg before meals and record, calling CCM RNCM and or provider [name], FNP  for findings outside established parameters    Patient Self Care Activities:   Self administers medications as prescribed  Attends all scheduled provider appointments  Calls pharmacy for medication refills  Performs ADL's independently  Performs IADL's independently  Calls provider office for new concerns or questions    Please see past updates related to this goal by clicking on the "Past Updates" button in the selected goal        . "I have pain everyday from my arthritis" (pt-stated)      Current Barriers:   Knowledge Deficits related to disease process and treatment management of arthritis pain   Impaired Physical Mobility secondary to having increased arthritis inflammation and joint pain    Nurse Case Manager Clinical Goal(s):   Over the next 30 days, patient will verbalize understanding of plan for MD recommendations for arthritis specific labs and or referral to Rheumatology and PT   Over the next 90 days, patient will verbalize having improved physical mobility   CCM RN CM Interventions:  02/14/19 completed call with patient      Evaluation of current treatment plan related to treatment management for arthritis pain and patient's adherence to plan as established by provider  Advised patient in home PT was previously  ordered; discussed the [facility] was unable to reach her by phone to schedule her intake visit - patient has new contact numbers due to changing providers  Discussed request to reorder in PT/OT to assist with balance, strenghthening and to instruct on safe home exercises to improve overall stamina  Assessed for home safety concerns and current use of DME - patient denies having any home safety concerns, she is currently using a rollator walker when needed - she denies falls  Collaborated with provider [name], FNP regarding recommendations for labs to rule out autoimmune, in-home PT and possible Rheumatology referral   Discussed plans with patient for ongoing care management follow up and provided patient with direct contact information for care management team   Patient Self Care Activities:   Self administers medications as prescribed  Attends all scheduled provider appointments  Calls pharmacy for medication refills  Performs ADL's independently  Performs IADL's independently  Calls provider office for new concerns or questions    Please see past updates related to this goal by clicking on the "Past Updates" button in the selected goal                 . Exercise 150 min/wk Moderate Activity     09/07/2019, wants to start exercising 10 minutes a day three days    . I would like to continue to manage my diabetes (pt-stated)     Current Barriers:  . Diabetes: T2DM; most recent A1c 6.8% on 09/01/18 (repeat A1c was missed, next PCP visit on 09/07/19)  . Current antihyperglycemic regimen: Xutolphy 30 units daily . denies hypoglycemic symptoms; denies hyperglycemic symptoms . Current meal patterns: o Patient's spouse recently passed away, therefore patient has been eating mostly foods brought from friends and family.  She is aware of how her diet should be and tries to incorporate low carb/sugar at every meal. . Current exercise: n/a . Current blood glucose readings: FBG  90-130 . Cardiovascular risk reduction: o Current hypertensive regimen: carvedilol, amlodipine o Current hyperlipidemia regimen: atorvastatin '40mg'$  daily  Pharmacist Clinical Goal(s):  .  Over the next 90 days, patient with work with PharmD and primary care provider to address need related to optimization of medication management of chronic conditions  Interventions: Clinic visit with patient on 09/07/19 . Comprehensive medication review performed, medication list updated in electronic medical record . Reviewed & discussed the following diabetes-related information with patient: o Continue checking blood sugars as directed o Follow ADA recommended "diabetes-friendly" diet  (reviewed healthy snack/food options) o Discussed insulin/GLP-1 injection technique o Reviewed medication purpose/side effects-->patient denies adverse events  Patient Self Care Activities:  . Patient will check blood glucose daily in the AM , document, and provide at future appointments . Patient will focus on medication adherence by continuing to take medications as prescribed . Patient will take medications as prescribed . Patient will contact provider with any episodes of hypoglycemia . Patient will report any questions or concerns to provider   Please see past updates related to this goal by clicking on the "Past Updates" button in the selected goal      . Weight (lb) < 200 lb (90.7 kg) (pt-stated)       Fall Risk Fall Risk  09/07/2019 03/03/2019 12/01/2018 09/01/2018 06/25/2018  Falls in the past year? 0 0 0 0 0  Risk for fall due to : Medication side effect - - Medication side effect -  Follow up Falls evaluation completed;Education provided;Falls prevention discussed - - - -   Is the patient's home free of loose throw rugs in walkways, pet beds, electrical cords, etc?   yes      Grab bars in the bathroom? yes      Handrails on the stairs?   yes      Adequate lighting?   yes  Timed Get Up and Go performed:  n/a  Depression Screen PHQ 2/9 Scores 09/07/2019 03/03/2019 12/08/2018 12/01/2018  PHQ - 2 Score 0 0 0 0  PHQ- 9 Score 0 - - -     Cognitive Function     6CIT Screen 09/07/2019 09/01/2018  What Year? 0 points 0 points  What month? 0 points 0 points  What time? 0 points 0 points  Count back from 20 0 points 0 points  Months in reverse 0 points 0 points  Repeat phrase 4 points 2 points  Total Score 4 2    Immunization History  Administered Date(s) Administered  . Influenza-Unspecified 04/22/2018  . Tdap 01/18/2015    Qualifies for Shingles Vaccine? yes  Screening Tests Health Maintenance  Topic Date Due  . FOOT EXAM  04/04/1961  . PNA vac Low Risk Adult (1 of 2 - PCV13) 04/04/2016  . HEMOGLOBIN A1C  03/02/2019  . OPHTHALMOLOGY EXAM  03/22/2020  . COLONOSCOPY  06/19/2020  . MAMMOGRAM  03/08/2021  . TETANUS/TDAP  01/17/2025  . INFLUENZA VACCINE  Completed  . DEXA SCAN  Completed  . Hepatitis C Screening  Completed    Cancer Screenings: Lung: Low Dose CT Chest recommended if Age 69-80 years, 30 pack-year currently smoking OR have quit w/in 15years. Patient does not qualify. Breast:  Up to date on Mammogram? Yes   Up to date of Bone Density/Dexa? Yes Colorectal: up to date  Additional Screenings: : Hepatitis C Screening: 01/28/2018     Plan:    Patient wants to start exercising. She has equipment that she is going to set up at home. She wants to start off with 10 minutes a day 3 days a week.   I have personally reviewed and noted the  following in the patient's chart:   . Medical and social history . Use of alcohol, tobacco or illicit drugs  . Current medications and supplements . Functional ability and status . Nutritional status . Physical activity . Advanced directives . List of other physicians . Hospitalizations, surgeries, and ER visits in previous 12 months . Vitals . Screenings to include cognitive, depression, and falls . Referrals and  appointments  In addition, I have reviewed and discussed with patient certain preventive protocols, quality metrics, and best practice recommendations. A written personalized care plan for preventive services as well as general preventive health recommendations were provided to patient.     Kellie Simmering, LPN  11/18/7197

## 2019-09-07 NOTE — Patient Instructions (Signed)
Health Maintenance, Female Adopting a healthy lifestyle and getting preventive care are important in promoting health and wellness. Ask your health care provider about:  The right schedule for you to have regular tests and exams.  Things you can do on your own to prevent diseases and keep yourself healthy. What should I know about diet, weight, and exercise? Eat a healthy diet   Eat a diet that includes plenty of vegetables, fruits, low-fat dairy products, and lean protein.  Do not eat a lot of foods that are high in solid fats, added sugars, or sodium. Maintain a healthy weight Body mass index (BMI) is used to identify weight problems. It estimates body fat based on height and weight. Your health care provider can help determine your BMI and help you achieve or maintain a healthy weight. Get regular exercise Get regular exercise. This is one of the most important things you can do for your health. Most adults should:  Exercise for at least 150 minutes each week. The exercise should increase your heart rate and make you sweat (moderate-intensity exercise).  Do strengthening exercises at least twice a week. This is in addition to the moderate-intensity exercise.  Spend less time sitting. Even light physical activity can be beneficial. Watch cholesterol and blood lipids Have your blood tested for lipids and cholesterol at 69 years of age, then have this test every 5 years. Have your cholesterol levels checked more often if:  Your lipid or cholesterol levels are high.  You are older than 69 years of age.  You are at high risk for heart disease. What should I know about cancer screening? Depending on your health history and family history, you may need to have cancer screening at various ages. This may include screening for:  Breast cancer.  Cervical cancer.  Colorectal cancer.  Skin cancer.  Lung cancer. What should I know about heart disease, diabetes, and high blood  pressure? Blood pressure and heart disease  High blood pressure causes heart disease and increases the risk of stroke. This is more likely to develop in people who have high blood pressure readings, are of African descent, or are overweight.  Have your blood pressure checked: ? Every 3-5 years if you are 18-39 years of age. ? Every year if you are 40 years old or older. Diabetes Have regular diabetes screenings. This checks your fasting blood sugar level. Have the screening done:  Once every three years after age 40 if you are at a normal weight and have a low risk for diabetes.  More often and at a younger age if you are overweight or have a high risk for diabetes. What should I know about preventing infection? Hepatitis B If you have a higher risk for hepatitis B, you should be screened for this virus. Talk with your health care provider to find out if you are at risk for hepatitis B infection. Hepatitis C Testing is recommended for:  Everyone born from 1945 through 1965.  Anyone with known risk factors for hepatitis C. Sexually transmitted infections (STIs)  Get screened for STIs, including gonorrhea and chlamydia, if: ? You are sexually active and are younger than 69 years of age. ? You are older than 69 years of age and your health care provider tells you that you are at risk for this type of infection. ? Your sexual activity has changed since you were last screened, and you are at increased risk for chlamydia or gonorrhea. Ask your health care provider if   you are at risk.  Ask your health care provider about whether you are at high risk for HIV. Your health care provider may recommend a prescription medicine to help prevent HIV infection. If you choose to take medicine to prevent HIV, you should first get tested for HIV. You should then be tested every 3 months for as long as you are taking the medicine. Pregnancy  If you are about to stop having your period (premenopausal) and  you may become pregnant, seek counseling before you get pregnant.  Take 400 to 800 micrograms (mcg) of folic acid every day if you become pregnant.  Ask for birth control (contraception) if you want to prevent pregnancy. Osteoporosis and menopause Osteoporosis is a disease in which the bones lose minerals and strength with aging. This can result in bone fractures. If you are 65 years old or older, or if you are at risk for osteoporosis and fractures, ask your health care provider if you should:  Be screened for bone loss.  Take a calcium or vitamin D supplement to lower your risk of fractures.  Be given hormone replacement therapy (HRT) to treat symptoms of menopause. Follow these instructions at home: Lifestyle  Do not use any products that contain nicotine or tobacco, such as cigarettes, e-cigarettes, and chewing tobacco. If you need help quitting, ask your health care provider.  Do not use street drugs.  Do not share needles.  Ask your health care provider for help if you need support or information about quitting drugs. Alcohol use  Do not drink alcohol if: ? Your health care provider tells you not to drink. ? You are pregnant, may be pregnant, or are planning to become pregnant.  If you drink alcohol: ? Limit how much you use to 0-1 drink a day. ? Limit intake if you are breastfeeding.  Be aware of how much alcohol is in your drink. In the [place] one drink equals one 12 oz bottle of beer (355 mL), one 5 oz glass of wine (148 mL), or one 1 oz glass of hard liquor (44 mL). General instructions  Schedule regular health, dental, and eye exams.  Stay current with your vaccines.  Tell your health care provider if: ? You often feel depressed. ? You have ever been abused or do not feel safe at home. Summary  Adopting a healthy lifestyle and getting preventive care are important in promoting health and wellness.  Follow your health care provider's instructions about healthy  diet, exercising, and getting tested or screened for diseases.  Follow your health care provider's instructions on monitoring your cholesterol and blood pressure. This information is not intended to replace advice given to you by your health care provider. Make sure you discuss any questions you have with your health care provider. Document Revised: 06/23/2018 Document Reviewed: 06/23/2018 Elsevier Patient Education  2020 [employer]  

## 2019-09-07 NOTE — Progress Notes (Signed)
This visit occurred during the SARS-CoV-2 public health emergency.  Safety protocols were in place, including screening questions prior to the visit, additional usage of staff PPE, and extensive cleaning of exam room while observing appropriate contact time as indicated for disinfecting solutions.  Subjective:     Patient ID: Kimberly Gross , female    DOB: [birth year] , 69 y.o.   MRN: [id number]   Chief Complaint  Patient presents with  . Annual Exam    HPI  Here for HM, husband passed in October.  She seen the heart doctor on the 22nd, they increased her hydralazine   She continues to go to behavioral health Dr. [name].   Diabetes She presents for her follow-up diabetic visit. She has type 2 diabetes mellitus. No MedicAlert identification noted. Her disease course has been stable. Pertinent negatives for hypoglycemia include no dizziness or headaches. Associated symptoms include chest pain. Pertinent negatives for diabetes include no blurred vision, no fatigue, no polydipsia, no polyphagia and no polyuria. There are no hypoglycemic complications. Symptoms are stable. There are no diabetic complications. Pertinent negatives for diabetic complications include no CVA. Risk factors for coronary artery disease include obesity and sedentary lifestyle. Current diabetic treatment includes insulin injections and oral agent (dual therapy) (Xultophy 30 units daily.  ). She is compliant with treatment all of the time. Her weight is stable. She is following a generally healthy diet. When asked about meal planning, she reported none. She has had a previous visit with a dietitian. She participates in exercise three times a week. Her home blood glucose trend is decreasing steadily. Her overall blood glucose range is 140-180 mg/dl. (Blood sugars from 99-160.  ) An ACE inhibitor/angiotensin II receptor blocker is being taken. She does not see a podiatrist.Eye exam is current.  Hypertension This is a chronic  problem. The current episode started more than 1 year ago. The problem is unchanged. The problem is controlled. Associated symptoms include chest pain and palpitations. Pertinent negatives include no blurred vision or headaches. There are no associated agents to hypertension. There are no known risk factors for coronary artery disease. Past treatments include ACE inhibitors. The current treatment provides no improvement. There are no compliance problems.  There is no history of angina or CVA. There is no history of chronic renal disease.   The patient states she is status post hysterectomy for birth control. Mammogram last done 03/10/2019. Negative for: breast discharge, breast lump(s), breast pain and breast self exam.  Pertinent negatives include abnormal bleeding (hematology), anxiety, decreased libido, depression, difficulty falling sleep, dyspareunia, history of infertility, nocturia, sexual dysfunction, sleep disturbances, urinary incontinence, urinary urgency, vaginal discharge and vaginal itching. Diet regular. The patient states her exercise level is minimal - planning to start exercising regularly.  She has been sleeping with the grand kids and now they are in their own bed.  Her daughter is living with her.    The patient's tobacco use is:  Social History   Tobacco Use  Smoking Status Never Smoker  Smokeless Tobacco Never Used   She has been exposed to passive smoke. The patient's alcohol use is:  Social History   Substance and Sexual Activity  Alcohol Use No  Additional information: Last pap - hysterectomy  Past Medical History:  Diagnosis Date  . Anemia   . Anxiety   . Bipolar 1 disorder ([place])   . Depression   . Diabetes mellitus without complication ([place])   . Hyperlipemia   . Hypertension   .  Occasional tremors      Family History  Problem Relation Age of Onset  . Hypertension Mother   . Heart attack Father   . Breast cancer Neg Hx      Current Outpatient  Medications:  .  Accu-Chek Softclix Lancets lancets, USE AS DIRECTED  TO CHECK BLOOD GLUCOSE, Disp: 200 each, Rfl: 3 .  acetaminophen (TYLENOL) 500 MG tablet, Take 1,000 mg by mouth 2 (two) times a day., Disp: , Rfl:  .  Alcohol Swabs (B-D SINGLE USE SWABS REGULAR) PADS, Use as directed to check blood sugars twice daily, Disp: 100 each, Rfl: 5 .  amLODipine (NORVASC) 10 MG tablet, Take 1 tablet (10 mg total) by mouth daily., Disp: 90 tablet, Rfl: 0 .  aspirin 81 MG tablet, Take 81 mg by mouth daily. , Disp: , Rfl:  .  atorvastatin (LIPITOR) 40 MG tablet, TAKE 1 TABLET BY MOUTH EVERYDAY AT BEDTIME, Disp: 90 tablet, Rfl: 0 .  Blood Glucose Monitoring Suppl (ACCU-CHEK AVIVA PLUS) w/Device KIT, Check blood sugar twice a day, Disp: 1 kit, Rfl: 0 .  carvedilol (COREG) 3.125 MG tablet, Take 1 tablet (3.125 mg total) by mouth 2 (two) times daily., Disp: 180 tablet, Rfl: 3 .  clonazePAM (KLONOPIN) 0.5 MG tablet, Take 1 tablet (0.5 mg total) by mouth 2 (two) times daily., Disp: 30 tablet, Rfl: 0 .  divalproex (DEPAKOTE) 500 MG DR tablet, 500 mg. 2 pills 2 times per day, Disp: , Rfl:  .  DM-APAP-CPM (CORICIDIN HBP PO), Take by mouth as needed., Disp: , Rfl:  .  fenofibrate 160 MG tablet, Take 1 tablet (160 mg total) by mouth daily., Disp: 90 tablet, Rfl: 0 .  ferrous sulfate 325 (65 FE) MG tablet, Take 325 mg by mouth daily with breakfast., Disp: , Rfl:  .  glucose blood (ACCU-CHEK AVIVA PLUS) test strip, Use as instructed, Disp: 300 each, Rfl: 3 .  hydrALAZINE (APRESOLINE) 50 MG tablet, Take 1 tablet (50 mg total) by mouth 3 (three) times daily., Disp: 270 tablet, Rfl: 1 .  Insulin Pen Needle (DROPLET PEN NEEDLES) 32G X 4 MM MISC, USE WITH XULTOPHY ONE TIME DAILY., Disp: 100 each, Rfl: 2 .  loratadine (CLARITIN) 10 MG tablet, Take 1 tablet (10 mg total) by mouth daily. (Patient taking differently: Take 10 mg by mouth as needed. ), Disp: 30 tablet, Rfl: 1 .  Multiple Vitamins-Minerals (MULTIVITAMIN ADULT PO),  Use as directed 1 tablet in the mouth or throat daily., Disp: , Rfl:  .  pneumococcal 13-valent conjugate vaccine (PREVNAR 13) SUSP injection, Inject 0.5 mLs into the muscle tomorrow at 10 am for 1 dose., Disp: 0.5 mL, Rfl: 0 .  vitamin B-12 (CYANOCOBALAMIN) 250 MCG tablet, Take 250 mcg by mouth daily., Disp: , Rfl:  .  XULTOPHY 100-3.6 UNIT-MG/ML SOPN, INJECT 20 UNITS BY SUBCUTANEOUS ROUTE EVERY DAY (Patient taking differently: Inject 30 Units into the skin daily. ), Disp: 15 pen, Rfl: 2   Allergies  Allergen Reactions  . Chlorpromazine Rash     Review of Systems  Constitutional: Negative.  Negative for fatigue.  HENT: Negative.   Eyes: Negative for blurred vision.  Respiratory: Negative.   Cardiovascular: Positive for chest pain and palpitations. Negative for leg swelling.       When grandchildren are acting up  Gastrointestinal: Negative.   Endocrine: Negative for polydipsia, polyphagia and polyuria.  Genitourinary: Negative.   Musculoskeletal: Negative.   Skin: Negative.   Neurological: Negative for dizziness and headaches.  Hematological:  Negative.   Psychiatric/Behavioral: Negative.      Today's Vitals   09/07/19 1107  BP: (!) 144/78  Pulse: (!) 105  Temp: 98.5 F (36.9 C)  TempSrc: Oral  Weight: 274 lb (124.3 kg)  Height: 5' 3"  (1.6 m)  PainSc: 0-No pain   Body mass index is 48.54 kg/m.   Objective:  Physical Exam Constitutional:      General: She is not in acute distress.    Appearance: Normal appearance. She is well-developed. She is obese.  HENT:     Head: Normocephalic and atraumatic.     Right Ear: Hearing, tympanic membrane, ear canal and external ear normal. There is no impacted cerumen.     Left Ear: Hearing, tympanic membrane, ear canal and external ear normal. There is no impacted cerumen.  Eyes:     General: Lids are normal.     Pupils: Pupils are equal, round, and reactive to light.     Funduscopic exam:    Right eye: No papilledema.         Left eye: No papilledema.  Neck:     Thyroid: No thyroid mass.     Vascular: No carotid bruit.  Cardiovascular:     Rate and Rhythm: Normal rate and regular rhythm.     Pulses: Normal pulses.     Heart sounds: Normal heart sounds. No murmur.  Pulmonary:     Effort: Pulmonary effort is normal. No respiratory distress.     Breath sounds: Normal breath sounds.  Abdominal:     General: Abdomen is flat. Bowel sounds are normal.     Palpations: Abdomen is soft.  Musculoskeletal:        General: No swelling. Normal range of motion.     Cervical back: Full passive range of motion without pain, normal range of motion and neck supple.     Right lower leg: No edema (right knee 2+).     Left lower leg: No edema.  Skin:    General: Skin is warm and dry.     Capillary Refill: Capillary refill takes less than 2 seconds.  Neurological:     General: No focal deficit present.     Mental Status: She is alert and oriented to person, place, and time.     Cranial Nerves: No cranial nerve deficit.     Sensory: No sensory deficit.  Psychiatric:        Mood and Affect: Mood normal.        Behavior: Behavior normal.        Thought Content: Thought content normal.        Judgment: Judgment normal.         Assessment And Plan:     1. Health maintenance examination . Behavior modifications discussed and diet history reviewed.   . Pt will continue to exercise regularly and modify diet with low GI, plant based foods and decrease intake of processed foods.  . Recommend intake of daily multivitamin, Vitamin D, and calcium.  . Recommend mammogram and colonoscopy for preventive screenings, as well as recommend immunizations that include influenza, TDAP, and Shingles  2. Type 2 diabetes mellitus without complication, without long-term current use of insulin (HCC)  Chronic, she is doing well on Xultophy  Will check HgbA1c. - CMP14+EGFR - VITAMIN D 25 Hydroxy (Vit-D Deficiency, Fractures) - Hemoglobin  A1c - CBC  3. Mixed hyperlipidemia  Chronic, controlled  Continue with current medications - Lipid panel  4. Morbid (severe) obesity due to  excess calories (HCC)  Chronic, discussed cutting back on junk food  She has recently lost her husband and has her grandchildren living with her  5. Bipolar affective disorder, currently depressed, moderate (HCC)  Chronic, stable, continue follow up with Behavioral Health  6. Essential hypertension  Chronic, fair control  Discussed importance of eating a low salt diet.   [name], FNP    THE PATIENT IS ENCOURAGED TO PRACTICE SOCIAL DISTANCING DUE TO THE COVID-19 PANDEMIC.

## 2019-09-07 NOTE — Progress Notes (Signed)
Chronic Care Management    Visit Note  09/07/2019 Name: DARCUS EDDS MRN: [id number] DOB: [birth year]  Referred by: [name], FNP Reason for referral : Chronic Care Management   Kimberly Gross is a 69 y.o. year old female who is a primary care patient of [name], [place]. The CCM team was consulted for assistance with chronic disease management and care coordination needs related to HLD and DMII  Review of patient status, including review of consultants reports, relevant laboratory and other test results, and collaboration with appropriate care team members and the patient's provider was performed as part of comprehensive patient evaluation and provision of chronic care management services.    SDOH (Social Determinants of Health) assessments performed: Yes See Care Plan activities for detailed interventions related to SDOH)     Medications: Outpatient Encounter Medications as of 09/07/2019  Medication Sig Note  . Accu-Chek Softclix Lancets lancets USE AS DIRECTED  TO CHECK BLOOD GLUCOSE   . acetaminophen (TYLENOL) 500 MG tablet Take 1,000 mg by mouth 2 (two) times a day.   . Alcohol Swabs (B-D SINGLE USE SWABS REGULAR) PADS Use as directed to check blood sugars twice daily   . amLODipine (NORVASC) 10 MG tablet Take 1 tablet (10 mg total) by mouth daily.   Marland Kitchen aspirin 81 MG tablet Take 81 mg by mouth daily.  05/08/2015: Received from: External Pharmacy Received Sig:   . Blood Glucose Monitoring Suppl (ACCU-CHEK AVIVA PLUS) w/Device KIT Check blood sugar twice a day   . carvedilol (COREG) 3.125 MG tablet Take 1 tablet (3.125 mg total) by mouth 2 (two) times daily.   . clonazePAM (KLONOPIN) 0.5 MG tablet Take 1 tablet (0.5 mg total) by mouth 2 (two) times daily.   . divalproex (DEPAKOTE) 500 MG DR tablet 500 mg. 2 pills 2 times per day 05/08/2015: Received from: External Pharmacy  . DM-APAP-CPM (CORICIDIN HBP PO) Take by mouth as needed.   . fenofibrate 160 MG tablet Take 1 tablet  (160 mg total) by mouth daily.   . ferrous sulfate 325 (65 FE) MG tablet Take 325 mg by mouth daily with breakfast.   . glucose blood (ACCU-CHEK AVIVA PLUS) test strip Use as instructed   . Insulin Pen Needle (DROPLET PEN NEEDLES) 32G X 4 MM MISC USE WITH XULTOPHY ONE TIME DAILY.   Marland Kitchen loratadine (CLARITIN) 10 MG tablet Take 1 tablet (10 mg total) by mouth daily. (Patient taking differently: Take 10 mg by mouth as needed. )   . Multiple Vitamins-Minerals (MULTIVITAMIN ADULT PO) Use as directed 1 tablet in the mouth or throat daily. 05/08/2015: Received from: External Pharmacy Received Sig:   . [EXPIRED] pneumococcal 13-valent conjugate vaccine (PREVNAR 13) SUSP injection Inject 0.5 mLs into the muscle tomorrow at 10 am for 1 dose.   . vitamin B-12 (CYANOCOBALAMIN) 250 MCG tablet Take 250 mcg by mouth daily.   [name] 100-3.6 UNIT-MG/ML SOPN INJECT 20 UNITS BY SUBCUTANEOUS ROUTE EVERY DAY (Patient taking differently: Inject 30 Units into the skin daily. ) 10/20/2018: She states she needs a new Rx for insurance to cover. She also states she has hit the donut hole and cost is increasing now.   . [DISCONTINUED] atorvastatin (LIPITOR) 40 MG tablet TAKE 1 TABLET BY MOUTH EVERYDAY AT BEDTIME   . [DISCONTINUED] hydrALAZINE (APRESOLINE) 50 MG tablet Take 1 tablet (50 mg total) by mouth 3 (three) times daily.    No facility-administered encounter medications on file as of 09/07/2019.  Objective:   Goals Addressed            This Visit's Progress     Patient Stated   . I would like to continue to manage my diabetes (pt-stated)       Current Barriers:  . Diabetes: T2DM; most recent A1c 6.8% on 09/01/18 (repeat A1c was missed, next PCP visit on 09/07/19) up a bit from 6.0% . Current antihyperglycemic regimen: Xutolphy 30 units daily o Patient assistance application submitted with CPhT, Caryl Pina Coleman's assistance; medication to arrive at PCP office when approved--still pending . denies hypoglycemic  symptoms; denies hyperglycemic symptoms . Current meal patterns: o Patient's spouse recently passed away, therefore patient has been eating mostly foods brought from friends and family.  She is aware of how her diet should be and tries to incorporate low carb/sugar at every meal. . Current exercise: n/a . Current blood glucose readings: FBG 90-130 . Cardiovascular risk reduction: o Current hypertensive regimen: carvedilol, amlodipine o Current hyperlipidemia regimen: atorvastatin 78m daily (LDL 81 on 09/02/19)  Pharmacist Clinical Goal(s):  .Marland KitchenOver the next 90 days, patient with work with PharmD and primary care provider to address need related to optimization of medication management of chronic conditions  Interventions: Clinic visit with patient on 09/07/19 . Comprehensive medication review performed, medication list updated in electronic medical record . Reviewed & discussed the following diabetes-related information with patient: o Continue checking blood sugars as directed o Follow ADA recommended "diabetes-friendly" diet  (reviewed healthy snack/food options) o Discussed insulin/GLP-1 injection technique o Reviewed medication purpose/side effects-->patient denies adverse events  Patient Self Care Activities:  . Patient will check blood glucose daily in the AM , document, and provide at future appointments . Patient will focus on medication adherence by continuing to take medications as prescribed . Patient will take medications as prescribed . Patient will contact provider with any episodes of hypoglycemia . Patient will report any questions or concerns to provider   Please see past updates related to this goal by clicking on the "Past Updates" button in the selected goal           Plan:   The care management team will reach out to the patient again over the next 60 days.   Provider Signature  JRegina Eck PharmD, BCPS Clinical Pharmacist, [facility] [phone]

## 2019-09-07 NOTE — Patient Instructions (Signed)
Kimberly Gross , Thank you for taking time to come for your Medicare Wellness Visit. I appreciate your ongoing commitment to your health goals. Please review the following plan we discussed and let me know if I can assist you in the future.   Screening recommendations/referrals: Colonoscopy: 06/2010 Mammogram: 02/2019 Bone Density: 02/2018 Recommended yearly ophthalmology/optometry visit for glaucoma screening and checkup Recommended yearly dental visit for hygiene and checkup  Vaccinations: Influenza vaccine: 03/2019 Pneumococcal vaccine: sent to pharmacy Tdap vaccine: 01/2015 Shingles vaccine: discussed    Advanced directives: Advance directive discussed with you today. Even though you declined this today please call our office should you change your mind and we can give you the proper paperwork for you to fill out.   Conditions/risks identified: obesity  Next appointment:    Preventive Care 69 Years and Older, Female Preventive care refers to lifestyle choices and visits with your health care provider that can promote health and wellness. What does preventive care include?  A yearly physical exam. This is also called an annual well check.  Dental exams once or twice a year.  Routine eye exams. Ask your health care provider how often you should have your eyes checked.  Personal lifestyle choices, including:  Daily care of your teeth and gums.  Regular physical activity.  Eating a healthy diet.  Avoiding tobacco and drug use.  Limiting alcohol use.  Practicing safe sex.  Taking low-dose aspirin every day.  Taking vitamin and mineral supplements as recommended by your health care provider. What happens during an annual well check? The services and screenings done by your health care provider during your annual well check will depend on your age, overall health, lifestyle risk factors, and family history of disease. Counseling  Your health care provider may ask you  questions about your:  Alcohol use.  Tobacco use.  Drug use.  Emotional well-being.  Home and relationship well-being.  Sexual activity.  Eating habits.  History of falls.  Memory and ability to understand (cognition).  Work and work Statistician.  Reproductive health. Screening  You may have the following tests or measurements:  Height, weight, and BMI.  Blood pressure.  Lipid and cholesterol levels. These may be checked every 5 years, or more frequently if you are over 49 years old.  Skin check.  Lung cancer screening. You may have this screening every year starting at age 18 if you have a 30-pack-year history of smoking and currently smoke or have quit within the past 15 years.  Fecal occult blood test (FOBT) of the stool. You may have this test every year starting at age 109.  Flexible sigmoidoscopy or colonoscopy. You may have a sigmoidoscopy every 5 years or a colonoscopy every 10 years starting at age 64.  Hepatitis C blood test.  Hepatitis B blood test.  Sexually transmitted disease (STD) testing.  Diabetes screening. This is done by checking your blood sugar (glucose) after you have not eaten for a while (fasting). You may have this done every 1-3 years.  Bone density scan. This is done to screen for osteoporosis. You may have this done starting at age 42.  Mammogram. This may be done every 1-2 years. Talk to your health care provider about how often you should have regular mammograms. Talk with your health care provider about your test results, treatment options, and if necessary, the need for more tests. Vaccines  Your health care provider may recommend certain vaccines, such as:  Influenza vaccine. This is recommended every  year.  Tetanus, diphtheria, and acellular pertussis (Tdap, Td) vaccine. You may need a Td booster every 10 years.  Zoster vaccine. You may need this after age 42.  Pneumococcal 13-valent conjugate (PCV13) vaccine. One dose is  recommended after age 19.  Pneumococcal polysaccharide (PPSV23) vaccine. One dose is recommended after age 72. Talk to your health care provider about which screenings and vaccines you need and how often you need them. This information is not intended to replace advice given to you by your health care provider. Make sure you discuss any questions you have with your health care provider. Document Released: 07/27/2015 Document Revised: 03/19/2016 Document Reviewed: 05/01/2015 Elsevier Interactive Patient Education  2017 [place] Prevention in the Home Falls can cause injuries. They can happen to people of all ages. There are many things you can do to make your home safe and to help prevent falls. What can I do on the outside of my home?  Regularly fix the edges of walkways and driveways and fix any cracks.  Remove anything that might make you trip as you walk through a door, such as a raised step or threshold.  Trim any bushes or trees on the path to your home.  Use bright outdoor lighting.  Clear any walking paths of anything that might make someone trip, such as rocks or tools.  Regularly check to see if handrails are loose or broken. Make sure that both sides of any steps have handrails.  Any raised decks and porches should have guardrails on the edges.  Have any leaves, snow, or ice cleared regularly.  Use sand or salt on walking paths during winter.  Clean up any spills in your garage right away. This includes oil or grease spills. What can I do in the bathroom?  Use night lights.  Install grab bars by the toilet and in the tub and shower. Do not use towel bars as grab bars.  Use non-skid mats or decals in the tub or shower.  If you need to sit down in the shower, use a plastic, non-slip stool.  Keep the floor dry. Clean up any water that spills on the floor as soon as it happens.  Remove soap buildup in the tub or shower regularly.  Attach bath mats  securely with double-sided non-slip rug tape.  Do not have throw rugs and other things on the floor that can make you trip. What can I do in the bedroom?  Use night lights.  Make sure that you have a light by your bed that is easy to reach.  Do not use any sheets or blankets that are too big for your bed. They should not hang down onto the floor.  Have a firm chair that has side arms. You can use this for support while you get dressed.  Do not have throw rugs and other things on the floor that can make you trip. What can I do in the kitchen?  Clean up any spills right away.  Avoid walking on wet floors.  Keep items that you use a lot in easy-to-reach places.  If you need to reach something above you, use a strong step stool that has a grab bar.  Keep electrical cords out of the way.  Do not use floor polish or wax that makes floors slippery. If you must use wax, use non-skid floor wax.  Do not have throw rugs and other things on the floor that can make you trip. What can  I do with my stairs?  Do not leave any items on the stairs.  Make sure that there are handrails on both sides of the stairs and use them. Fix handrails that are broken or loose. Make sure that handrails are as long as the stairways.  Check any carpeting to make sure that it is firmly attached to the stairs. Fix any carpet that is loose or worn.  Avoid having throw rugs at the top or bottom of the stairs. If you do have throw rugs, attach them to the floor with carpet tape.  Make sure that you have a light switch at the top of the stairs and the bottom of the stairs. If you do not have them, ask someone to add them for you. What else can I do to help prevent falls?  Wear shoes that:  Do not have high heels.  Have rubber bottoms.  Are comfortable and fit you well.  Are closed at the toe. Do not wear sandals.  If you use a stepladder:  Make sure that it is fully opened. Do not climb a closed  stepladder.  Make sure that both sides of the stepladder are locked into place.  Ask someone to hold it for you, if possible.  Clearly mark and make sure that you can see:  Any grab bars or handrails.  First and last steps.  Where the edge of each step is.  Use tools that help you move around (mobility aids) if they are needed. These include:  Canes.  Walkers.  Scooters.  Crutches.  Turn on the lights when you go into a dark area. Replace any light bulbs as soon as they burn out.  Set up your furniture so you have a clear path. Avoid moving your furniture around.  If any of your floors are uneven, fix them.  If there are any pets around you, be aware of where they are.  Review your medicines with your doctor. Some medicines can make you feel dizzy. This can increase your chance of falling. Ask your doctor what other things that you can do to help prevent falls. This information is not intended to replace advice given to you by your health care provider. Make sure you discuss any questions you have with your health care provider. Document Released: 04/26/2009 Document Revised: 12/06/2015 Document Reviewed: 08/04/2014 Elsevier Interactive Patient Education  2017 [employer].

## 2019-09-09 NOTE — Patient Instructions (Signed)
Visit Information  Goals Addressed            This Visit's Progress     Patient Stated   . I would like to continue to manage my diabetes (pt-stated)       Current Barriers:  . Diabetes: T2DM; most recent A1c 6.8% on 09/01/18 (repeat A1c was missed, next PCP visit on 09/07/19) up a bit from 6.0% . Current antihyperglycemic regimen: Xutolphy 30 units daily o Patient assistance application submitted with [facility]Ph[facility], Caryl Pina Coleman's assistance; medication to arrive at PCP office when approved--still pending . denies hypoglycemic symptoms; denies hyperglycemic symptoms . Current meal patterns: o Patient's spouse recently passed away, therefore patient has been eating mostly foods brought from friends and family.  She is aware of how her diet should be and tries to incorporate low carb/sugar at every meal. . Current exercise: n/a . Current blood glucose readings: FBG 90-130 . Cardiovascular risk reduction: o Current hypertensive regimen: carvedilol, amlodipine o Current hyperlipidemia regimen: atorvastatin 40mg  daily (LDL 81 on 09/02/19)  Pharmacist Clinical Goal(s):  Marland Kitchen Over the next 90 days, patient with work with PharmD and primary care provider to address need related to optimization of medication management of chronic conditions  Interventions: Clinic visit with patient on 09/07/19 . Comprehensive medication review performed, medication list updated in electronic medical record . Reviewed & discussed the following diabetes-related information with patient: o Continue checking blood sugars as directed o Follow ADA recommended "diabetes-friendly" diet  (reviewed healthy snack/food options) o Discussed insulin/GLP-1 injection technique o Reviewed medication purpose/side effects-->patient denies adverse events  Patient Self Care Activities:  . Patient will check blood glucose daily in the AM , document, and provide at future appointments . Patient will focus on medication adherence by  continuing to take medications as prescribed . Patient will take medications as prescribed . Patient will contact provider with any episodes of hypoglycemia . Patient will report any questions or concerns to provider   Please see past updates related to this goal by clicking on the "Past Updates" button in the selected goal         The patient verbalized understanding of instructions provided today and declined a print copy of patient instruction materials.   The care management team will reach out to the patient again over the next 60 days.   SIGNATURE Regina Eck, PharmD, BCPS Clinical Pharmacist, [facility] [place]: [phone]

## 2019-09-11 DIAGNOSIS — I10 Essential (primary) hypertension: Secondary | ICD-10-CM

## 2019-09-11 DIAGNOSIS — E785 Hyperlipidemia, unspecified: Secondary | ICD-10-CM

## 2019-09-11 DIAGNOSIS — E119 Type 2 diabetes mellitus without complications: Secondary | ICD-10-CM

## 2019-09-12 LAB — CBC
Hematocrit: 41 % (ref 34.0–46.6)
Hemoglobin: 13.2 g/dL (ref 11.1–15.9)
MCH: 29.6 pg (ref 26.6–33.0)
MCHC: 32.2 g/dL (ref 31.5–35.7)
MCV: 92 fL (ref 79–97)
Platelets: 288 10*3/uL (ref 150–450)
RBC: 4.46 x10E6/uL (ref 3.77–5.28)
RDW: 11.8 % (ref 11.7–15.4)
WBC: 9.2 10*3/uL (ref 3.4–10.8)

## 2019-09-12 LAB — CMP14+EGFR
ALT: 18 IU/L (ref 0–32)
AST: 21 IU/L (ref 0–40)
Albumin/Globulin Ratio: 1.8 (ref 1.2–2.2)
Albumin: 4.8 g/dL (ref 3.8–4.8)
Alkaline Phosphatase: 87 IU/L (ref 39–117)
BUN/Creatinine Ratio: 20 (ref 12–28)
BUN: 43 mg/dL — ABNORMAL HIGH (ref 8–27)
Bilirubin Total: <0.2 mg/dL (ref 0.0–1.2)
CO2: 28 mmol/L (ref 20–29)
Calcium: 10.2 mg/dL (ref 8.7–10.3)
Chloride: 101 mmol/L (ref 96–106)
Creatinine, Ser: 2.15 mg/dL — ABNORMAL HIGH (ref 0.57–1.00)
GFR calc Af Amer: 27 mL/min/{1.73_m2} — ABNORMAL LOW (ref >59)
GFR calc non Af Amer: 23 mL/min/{1.73_m2} — ABNORMAL LOW (ref >59)
Globulin, Total: 2.6 g/dL (ref 1.5–4.5)
Glucose: 228 mg/dL — ABNORMAL HIGH (ref 65–99)
Potassium: 4.5 mmol/L (ref 3.5–5.2)
Sodium: 143 mmol/L (ref 134–144)
Total Protein: 7.4 g/dL (ref 6.0–8.5)

## 2019-09-12 LAB — VITAMIN D 25 HYDROXY (VIT D DEFICIENCY, FRACTURES): Vit D, 25-Hydroxy: 25.5 ng/mL — ABNORMAL LOW (ref 30.0–100.0)

## 2019-09-12 LAB — LIPID PANEL
Chol/HDL Ratio: 3.7 ratio (ref 0.0–4.4)
Cholesterol, Total: 206 mg/dL — ABNORMAL HIGH (ref 100–199)
HDL: 55 mg/dL (ref >39)
LDL Chol Calc (NIH): 104 mg/dL — ABNORMAL HIGH (ref 0–99)
Triglycerides: 277 mg/dL — ABNORMAL HIGH (ref 0–149)
VLDL Cholesterol Cal: 47 mg/dL — ABNORMAL HIGH (ref 5–40)

## 2019-09-12 LAB — HEMOGLOBIN A1C
Est. average glucose Bld gHb Est-mCnc: 146 mg/dL
Hgb A1c MFr Bld: 6.7 % — ABNORMAL HIGH (ref 4.8–5.6)

## 2019-09-13 ENCOUNTER — Telehealth: Payer: Self-pay

## 2019-09-14 DIAGNOSIS — L84 Corns and callosities: Secondary | ICD-10-CM | POA: Diagnosis not present

## 2019-09-14 DIAGNOSIS — L603 Nail dystrophy: Secondary | ICD-10-CM | POA: Diagnosis not present

## 2019-09-14 DIAGNOSIS — E1051 Type 1 diabetes mellitus with diabetic peripheral angiopathy without gangrene: Secondary | ICD-10-CM | POA: Diagnosis not present

## 2019-09-14 DIAGNOSIS — I739 Peripheral vascular disease, unspecified: Secondary | ICD-10-CM | POA: Diagnosis not present

## 2019-09-15 ENCOUNTER — Other Ambulatory Visit: Payer: Self-pay | Admitting: Pharmacy Technician

## 2019-09-15 NOTE — Patient Outreach (Signed)
Kimberly Gross) Care Management  09/15/2019  Kimberly Gross [birth year] [id number]    Follow up call placed to [employer] regarding patient assistance application(s) for [name] confirms patient has been approved as of 3/4 until 07/13/2020. Medication to arrive at the office in 10-14 business days.  Follow up:  Will route note to [place] to inform and will remove myself from care team.  Maud Deed. Chana Bode [address] Certified Pharmacy Technician [place] Management Direct Dial:[phone]

## 2019-09-19 DIAGNOSIS — N184 Chronic kidney disease, stage 4 (severe): Secondary | ICD-10-CM | POA: Diagnosis not present

## 2019-09-19 DIAGNOSIS — I129 Hypertensive chronic kidney disease with stage 1 through stage 4 chronic kidney disease, or unspecified chronic kidney disease: Secondary | ICD-10-CM | POA: Diagnosis not present

## 2019-09-19 DIAGNOSIS — G4730 Sleep apnea, unspecified: Secondary | ICD-10-CM | POA: Diagnosis not present

## 2019-09-19 DIAGNOSIS — D649 Anemia, unspecified: Secondary | ICD-10-CM | POA: Diagnosis not present

## 2019-09-19 DIAGNOSIS — E1122 Type 2 diabetes mellitus with diabetic chronic kidney disease: Secondary | ICD-10-CM | POA: Diagnosis not present

## 2019-09-19 DIAGNOSIS — E611 Iron deficiency: Secondary | ICD-10-CM | POA: Diagnosis not present

## 2019-09-20 ENCOUNTER — Ambulatory Visit (INDEPENDENT_AMBULATORY_CARE_PROVIDER_SITE_OTHER): Payer: Medicare HMO | Admitting: Internal Medicine

## 2019-09-20 ENCOUNTER — Encounter: Payer: Self-pay | Admitting: Internal Medicine

## 2019-09-20 ENCOUNTER — Other Ambulatory Visit: Payer: Self-pay

## 2019-09-20 VITALS — BP 144/86 | HR 99 | Ht 63.0 in | Wt 272.0 lb

## 2019-09-20 DIAGNOSIS — R9431 Abnormal electrocardiogram [ECG] [EKG]: Secondary | ICD-10-CM

## 2019-09-20 DIAGNOSIS — F411 Generalized anxiety disorder: Secondary | ICD-10-CM

## 2019-09-20 DIAGNOSIS — N184 Chronic kidney disease, stage 4 (severe): Secondary | ICD-10-CM

## 2019-09-20 DIAGNOSIS — I10 Essential (primary) hypertension: Secondary | ICD-10-CM | POA: Diagnosis not present

## 2019-09-20 DIAGNOSIS — G4733 Obstructive sleep apnea (adult) (pediatric): Secondary | ICD-10-CM

## 2019-09-20 DIAGNOSIS — E119 Type 2 diabetes mellitus without complications: Secondary | ICD-10-CM | POA: Diagnosis not present

## 2019-09-20 DIAGNOSIS — E785 Hyperlipidemia, unspecified: Secondary | ICD-10-CM

## 2019-09-20 MED ORDER — CARVEDILOL 6.25 MG PO TABS: 6.2500 mg | ORAL_TABLET | Freq: Two times a day (BID) | ORAL | 3 refills | Status: DC

## 2019-09-20 NOTE — Progress Notes (Signed)
Cardiology Office Note:    Date:  09/20/2019   ID:  [name], DOB [birth year], MRN [id number]  PCP:  [name], FNP  Cardiologist:  Elouise Munroe, MD  Electrophysiologist:  None   Referring MD: [name], FNP   Chief Complaint: f/u HTN  History of Present Illness:    Kimberly Gross is a 69 y.o. female with a history of hypertension, diabetes, chronic kidney disease stage IV, bipolar 1 disorder.  Follow up today for HTN.  BP better controlled, notes more BP readings in the 130s-140s.   She is very thankful for the involvement of our social worker Kennyth Lose for helping her obtain bunk beds for her granddaughters, so that they do not have to share a bed with her. She is sleeping better now.   Past Medical History:  Diagnosis Date  . Anemia   . Anxiety   . Bipolar 1 disorder ([place])   . Depression   . Diabetes mellitus without complication ([place])   . Hyperlipemia   . Hypertension   . Occasional tremors     Past Surgical History:  Procedure Laterality Date  . VAGINAL HYSTERECTOMY      Current Medications: Current Meds  Medication Sig  . Accu-Chek Softclix Lancets lancets USE AS DIRECTED  TO CHECK BLOOD GLUCOSE  . acetaminophen (TYLENOL) 500 MG tablet Take 1,000 mg by mouth 2 (two) times a day.  . Alcohol Swabs (B-D SINGLE USE SWABS REGULAR) PADS Use as directed to check blood sugars twice daily  . amLODipine (NORVASC) 10 MG tablet Take 1 tablet (10 mg total) by mouth daily.  Marland Kitchen aspirin 81 MG tablet Take 81 mg by mouth daily.   Marland Kitchen atorvastatin (LIPITOR) 40 MG tablet TAKE 1 TABLET BY MOUTH EVERYDAY AT BEDTIME  . Blood Glucose Monitoring Suppl (ACCU-CHEK AVIVA PLUS) w/Device KIT Check blood sugar twice a day  . clonazePAM (KLONOPIN) 0.5 MG tablet Take 1 tablet (0.5 mg total) by mouth 2 (two) times daily.  . divalproex (DEPAKOTE) 500 MG DR tablet 500 mg. 2 pills 2 times per day  . DM-APAP-CPM (CORICIDIN HBP PO) Take by mouth as needed.  . fenofibrate 160 MG  tablet Take 1 tablet (160 mg total) by mouth daily.  . ferrous sulfate 325 (65 FE) MG tablet Take 325 mg by mouth daily with breakfast.  . glucose blood (ACCU-CHEK AVIVA PLUS) test strip Use as instructed  . hydrALAZINE (APRESOLINE) 50 MG tablet Take 1 tablet (50 mg total) by mouth 3 (three) times daily.  . Insulin Pen Needle (DROPLET PEN NEEDLES) 32G X 4 MM MISC USE WITH XULTOPHY ONE TIME DAILY.  Marland Kitchen loratadine (CLARITIN) 10 MG tablet Take 1 tablet (10 mg total) by mouth daily. (Patient taking differently: Take 10 mg by mouth as needed. )  . Multiple Vitamins-Minerals (MULTIVITAMIN ADULT PO) Use as directed 1 tablet in the mouth or throat daily.  . vitamin B-12 (CYANOCOBALAMIN) 250 MCG tablet Take 250 mcg by mouth daily.  Kimberly Gross 100-3.6 UNIT-MG/ML SOPN INJECT 20 UNITS BY SUBCUTANEOUS ROUTE EVERY DAY (Patient taking differently: Inject 30 Units into the skin daily. )     Allergies:   Chlorpromazine   Social History   Socioeconomic History  . Marital status: Widowed    Spouse name: Not on file  . Number of children: 2  . Years of education: Not on file  . Highest education level: Not on file  Occupational History  . Occupation: retired  Tobacco Use  . Smoking status:  Never Smoker  . Smokeless tobacco: Never Used  Substance and Sexual Activity  . Alcohol use: No  . Drug use: No  . Sexual activity: Not Currently  Other Topics Concern  . Not on file  Social History Narrative   Drinks 1-2 caffeine drinks a day    Social Determinants of Health   Financial Resource Strain: Low Risk   . Difficulty of Paying Living Expenses: Not hard at all  Food Insecurity: No Food Insecurity  . Worried About Charity fundraiser in the Last Year: Never true  . Ran Out of Food in the Last Year: Never true  Transportation Needs: No Transportation Needs  . Lack of Transportation (Medical): No  . Lack of Transportation (Non-Medical): No  Physical Activity: Inactive  . Days of Exercise per Week: 0  days  . Minutes of Exercise per Session: 0 min  Stress: No Stress Concern Present  . Feeling of Stress : Not at all  Social Connections: Unknown  . Frequency of Communication with Friends and Family: More than three times a week  . Frequency of Social Gatherings with Friends and Family: More than three times a week  . Attends Religious Services: Not on file  . Active Member of Clubs or Organizations: Not on file  . Attends Archivist Meetings: Not on file  . Marital Status: Married     Family History: The patient's family history includes Heart attack in her father; Hypertension in her mother. There is no history of Breast cancer.  ROS:   Please see the history of present illness.    All other systems reviewed and are negative.  EKGs/Labs/Other Studies Reviewed:    The following studies were reviewed today:  EKG:  NSR, LAD, rsr' pattern, LVH, inferior and anterolateral infarct suspected.  Recent Labs: 09/07/2019: ALT 18; BUN 43; Creatinine, Ser 2.15; Hemoglobin 13.2; Platelets 288; Potassium 4.5; Sodium 143  Recent Lipid Panel    Component Value Date/Time   CHOL 206 (H) 09/07/2019 1217   TRIG 277 (H) 09/07/2019 1217   HDL 55 09/07/2019 1217   CHOLHDL 3.7 09/07/2019 1217   CHOLHDL 2.7 08/23/2010 2238   VLDL 12 08/23/2010 2238   LDLCALC 104 (H) 09/07/2019 1217    Physical Exam:    VS:  BP (!) 144/86   Pulse 99   Ht _0  (1.6 m)   Wt 272 lb (123.4 kg)   SpO2 96%   BMI 48.18 kg/m     Wt Readings from Last 5 Encounters:  09/20/19 272 lb (123.4 kg)  09/07/19 274 lb (124.3 kg)  09/07/19 274 lb 9.6 oz (124.6 kg)  08/04/19 274 lb 9.6 oz (124.6 kg)  07/18/19 262 lb (118.8 kg)     Constitutional: No acute distress Eyes: sclera non-icteric, normal conjunctiva and lids ENMT: normal dentition, moist mucous membranes Cardiovascular: regular rhythm, normal rate, no murmurs. S1 and S2 normal. Radial pulses normal bilaterally. No jugular venous distention.    Respiratory: clear to auscultation bilaterally GI : normal bowel sounds, soft and nontender. No distention.   MSK: extremities warm, well perfused. No edema.  NEURO: grossly nonfocal exam, moves all extremities. PSYCH: alert and oriented x 3, normal mood and affect.   ASSESSMENT:    1. Essential hypertension   2. Hyperlipidemia, unspecified hyperlipidemia type   3. OSA (obstructive sleep apnea)   4. Type 2 diabetes mellitus without complication, without long-term current use of insulin ([place])   5. Anxiety state   6. CKD (  chronic kidney disease) stage 4, GFR 15-29 ml/min (HCC)   7. Abnormal electrocardiogram (ECG) (EKG)    PLAN:    HTN - control still suboptimal for DM2, will increase carvedilol to 6.25 mg Bid. Continue amlodipine, hydralazine, lasix MWF.  HLD - continue atorvastatin, consider cessation of fibrate in combination.  Abnormal EKG - monitor. Echo showed moderate LVH likely due to longstanding HTN.   DM2 - from cardiovascular perspective, keep BP <140/90 and LDL <70.  Total time of encounter: 30 minutes total time of encounter, including 25 minutes spent in face-to-face patient care, on the date of this encounter. This time includes coordination of care and counseling regarding above mentioned problem list. Remainder of non-face-to-face time involved reviewing chart documents/testing relevant to the patient encounter and documentation in the medical record. I have independently reviewed documentation from referring provider.   Cherlynn Kaiser, MD [place]  [facility]    Medication Adjustments/Labs and Tests Ordered: Current medicines are reviewed at length with the patient today.  Concerns regarding medicines are outlined above.  Orders Placed This Encounter  Procedures  . EKG 12-Lead   Meds ordered this encounter  Medications  . carvedilol (COREG) 6.25 MG tablet    Sig: Take 1 tablet (6.25 mg total) by mouth 2 (two) times daily.    Dispense:  180 tablet     Refill:  3    New dose, d/c 3.125 mg tab    Patient Instructions  Medication Instructions:  INCREASE YOUR CARVEDILOL TO 6.25 MG TWICE A DAY DOUBLE UP ON WHAT YOU HAVE UNTIL YOU RUN OUT  *If you need a refill on your cardiac medications before your next appointment, please call your pharmacy*  Lab Work: NONE   Testing/Procedures: NONE   Follow-Up: At [facility], you and your health needs are our priority.  As part of our continuing mission to provide you with exceptional heart care, we have created designated Provider Care Teams.  These Care Teams include your primary Cardiologist (physician) and Advanced Practice Providers (APPs -  Physician Assistants and Nurse Practitioners) who all work together to provide you with the care you need, when you need it.  We recommend signing up for the patient portal called "MyChart".  Sign up information is provided on this After Visit Summary.  MyChart is used to connect with patients for Virtual Visits (Telemedicine).  Patients are able to view lab/test results, encounter notes, upcoming appointments, etc.  Non-urgent messages can be sent to your provider as well.   To learn more about what you can do with MyChart, go to [email or web address].    Your next appointment:   3 month(s)  The format for your next appointment:   In Person  Provider:   You may see Elouise Munroe, MD or one of the following Advanced Practice Providers on your designated Care Team:    Rosaria Ferries, PA-C  Jory Sims, DNP, ANP  Cadence Kathlen Mody, NP

## 2019-09-20 NOTE — Patient Instructions (Signed)
Medication Instructions:  INCREASE YOUR CARVEDILOL TO 6.25 MG TWICE A DAY DOUBLE UP ON WHAT YOU HAVE UNTIL YOU RUN OUT  *If you need a refill on your cardiac medications before your next appointment, please call your pharmacy*  Lab Work: NONE   Testing/Procedures: NONE   Follow-Up: At [facility], you and your health needs are our priority.  As part of our continuing mission to provide you with exceptional heart care, we have created designated Provider Care Teams.  These Care Teams include your primary Cardiologist (physician) and Advanced Practice Providers (APPs -  Physician Assistants and Nurse Practitioners) who all work together to provide you with the care you need, when you need it.  We recommend signing up for the patient portal called "MyChart".  Sign up information is provided on this After Visit Summary.  MyChart is used to connect with patients for Virtual Visits (Telemedicine).  Patients are able to view lab/test results, encounter notes, upcoming appointments, etc.  Non-urgent messages can be sent to your provider as well.   To learn more about what you can do with MyChart, go to [email or web address].    Your next appointment:   3 month(s)  The format for your next appointment:   In Person  Provider:   You may see Elouise Munroe, MD or one of the following Advanced Practice Providers on your designated Care Team:    Rosaria Ferries, PA-C  Jory Sims, DNP, ANP  Cadence Kathlen Mody, NP

## 2019-09-22 ENCOUNTER — Telehealth: Payer: Self-pay | Admitting: Internal Medicine

## 2019-09-22 NOTE — Telephone Encounter (Signed)
New Message:   Pt wanted you to know she is changing pharmacy. She will now use [facility].

## 2019-09-22 NOTE — Telephone Encounter (Signed)
Spoke with the pt and her [place] and endorsed to them both that I updated [facility] as the pts "preferred pharmacy," in her chart.  Both parties verbalized understanding and gracious for all the assistance provided.

## 2019-09-26 ENCOUNTER — Encounter: Payer: Self-pay | Admitting: Adult Health

## 2019-09-27 ENCOUNTER — Telehealth: Payer: Self-pay

## 2019-09-27 NOTE — Telephone Encounter (Signed)
LVM for pt letting her know her needles from the pt assistance program is ready for pick up

## 2019-09-28 ENCOUNTER — Ambulatory Visit: Payer: Self-pay | Admitting: Pharmacist

## 2019-09-28 DIAGNOSIS — E119 Type 2 diabetes mellitus without complications: Secondary | ICD-10-CM

## 2019-09-28 NOTE — Progress Notes (Signed)
  Chronic Care Management   Outreach Note  09/28/2019 Name: Kimberly Gross MRN: [id number] DOB: 1950-08-05  Referred by: [name], FNP Reason for referral : Chronic Care Management and Diabetes   An unsuccessful telephone outreach was attempted today. The patient was referred to the case management team for assistance with care management and care coordination.   Follow Up Plan: A HIPPA compliant phone message was left for the patient providing contact information and requesting a return call.  The care management team will reach out to the patient again over the next 5 days.     SIGNATURE Regina Eck, PharmD, BCPS Clinical Pharmacist, [facility] [place]: [phone]

## 2019-09-29 ENCOUNTER — Ambulatory Visit: Payer: Medicare HMO | Admitting: Adult Health

## 2019-09-29 ENCOUNTER — Encounter: Payer: Self-pay | Admitting: Adult Health

## 2019-09-29 VITALS — BP 128/72 | HR 88 | Temp 97.3°F | Ht 63.0 in | Wt 276.0 lb

## 2019-09-29 DIAGNOSIS — Z9989 Dependence on other enabling machines and devices: Secondary | ICD-10-CM | POA: Diagnosis not present

## 2019-09-29 DIAGNOSIS — G4733 Obstructive sleep apnea (adult) (pediatric): Secondary | ICD-10-CM

## 2019-09-29 NOTE — Patient Instructions (Addendum)
Your Plan:  Continue using CPAP nightly  Try using CPAP the entire time you are sleeping If your symptoms worsen or you develop new symptoms please let us know.   Thank you for coming to see [place] at [facility]. I hope we have been able to provide you high quality care today.  You may receive a patient satisfaction survey over the next few weeks. We would appreciate your feedback and comments so that we may continue to improve ourselves and the health of our patients.

## 2019-09-29 NOTE — Progress Notes (Addendum)
PATIENT: Kimberly Gross DOB: October 10, 1950  REASON FOR VISIT: follow up Kimberly FROM: patient  Kimberly OF PRESENT ILLNESS: Today 09/29/19:  Kimberly Gross is a 69 year old female with a Kimberly of obstructive sleep apnea on CPAP.  Her download indicates that she used her machine 68 out of the last 90 days.  Poor compliance of 76%.  She used her machine greater than 4 hours 44 days for compliance of 49%.  On average she her machine 3 hours and 49 minutes.  Her residual AHI is 2.8 on 17/13 centimeters of water.  Her leak in the 95th percentile is 48.7 L/min.  Reports that there was several days in January that she was unable to use the machine because her grandchildren was in the bed with her.  She states that occasionally the mask will leak but this usually is depending on what position she is sleeping in.  Kimberly Gross is a 69 y.o. female , with of OSA with BiPAP use and is being seen today for one-year follow-up visit.  Prior visit on 01/22/2017 with Dr. Brett Gross with one appointment canceled due to weather and was a no-show to her rescheduled visit.  She is being seen today for BiPAP compliance and treatment of OSA.  BiPAP download from 07/25/2018 to 08/23/2018 shows overall usage days 30/3400% compliance with greater than 4 hours 22 days for 73% compliance.  Average usage 4 hours and 7 minutes with residual AHI 3.7.  Leaks in 95th percentile 34.6 L/min with IPAP 17 and EPAP 13  She endorses sleeping at night for 6-8 hours per night. She typically goes to bed around 8pm but will remove mask after 4 hrs of use.  When questioned reasoning behind this, she states she was told she only needs to wear it for 4 hours but denies bothersome symptoms or issues with tolerability avoid mask for longer. She will wake up throughout the night to go to the bathroom and once she knows she has worn for 4 hours, she will remove it.  She does endorse her hose around her mask is starting to tear and needs this  replaced. She also needs filter replacement as the door to hold the filter in has become damaged.  She has lost weight with todays weight 267 with prior 273. She does endorse participating in Kimberly Gross classes and doing exercises at home.  Had 9 teeth pulled since prior visit - she now has top denture plate. She plans on undergoing additional dental work.   REVIEW OF SYSTEMS: Out of a complete 14 system review of symptoms, the patient complains only of the following symptoms, and all other reviewed systems are negative.  ESS 9   ALLERGIES: Allergies  Allergen Reactions  . Chlorpromazine Rash    HOME MEDICATIONS: Outpatient Medications Prior to Visit  Medication Sig Dispense Refill  . Accu-Chek Softclix Lancets lancets USE AS DIRECTED  TO CHECK BLOOD GLUCOSE 200 each 3  . acetaminophen (TYLENOL) 500 MG tablet Take 1,000 mg by mouth 2 (two) times a day.    . Alcohol Swabs (B-D SINGLE USE SWABS REGULAR) PADS Use as directed to check blood sugars twice daily 100 each 5  . amLODipine (NORVASC) 10 MG tablet Take 1 tablet (10 mg total) by mouth daily. 90 tablet 0  . aspirin 81 MG tablet Take 81 mg by mouth daily.     Marland Kitchen atorvastatin (LIPITOR) 40 MG tablet TAKE 1 TABLET BY MOUTH EVERYDAY AT BEDTIME 90 tablet 0  .  Blood Glucose Monitoring Suppl (ACCU-CHEK AVIVA PLUS) w/Device KIT Check blood sugar twice a day 1 kit 0  . carvedilol (COREG) 6.25 MG tablet Take 1 tablet (6.25 mg total) by mouth 2 (two) times daily. 180 tablet 3  . clonazePAM (KLONOPIN) 0.5 MG tablet Take 1 tablet (0.5 mg total) by mouth 2 (two) times daily. 30 tablet 0  . divalproex (DEPAKOTE) 500 MG DR tablet 500 mg. 2 pills 2 times per day    . DM-APAP-CPM (CORICIDIN HBP PO) Take by mouth as needed.    . fenofibrate 160 MG tablet Take 1 tablet (160 mg total) by mouth daily. 90 tablet 0  . ferrous sulfate 325 (65 FE) MG tablet Take 325 mg by mouth daily with breakfast.    . glucose blood (ACCU-CHEK AVIVA PLUS) test strip Use as  instructed 300 each 3  . hydrALAZINE (APRESOLINE) 50 MG tablet Take 1 tablet (50 mg total) by mouth 3 (three) times daily. 270 tablet 1  . Insulin Pen Needle (DROPLET PEN NEEDLES) 32G X 4 MM MISC USE WITH XULTOPHY ONE TIME DAILY. 100 each 2  . loratadine (CLARITIN) 10 MG tablet Take 1 tablet (10 mg total) by mouth daily. (Patient taking differently: Take 10 mg by mouth as needed. ) 30 tablet 1  . Multiple Vitamins-Minerals (MULTIVITAMIN ADULT PO) Use as directed 1 tablet in the mouth or throat daily.    . vitamin B-12 (CYANOCOBALAMIN) 250 MCG tablet Take 250 mcg by mouth daily.    Kimberly Gross 100-3.6 UNIT-MG/ML SOPN INJECT 20 UNITS BY SUBCUTANEOUS ROUTE EVERY DAY (Patient taking differently: Inject 30 Units into the skin daily. ) 15 pen 2   No facility-administered medications prior to visit.    PAST MEDICAL Kimberly: Past Medical Kimberly:  Diagnosis Date  . Anemia   . Anxiety   . Bipolar 1 disorder ([place])   . Depression   . Diabetes mellitus without complication ([place])   . Hyperlipemia   . Hypertension   . Occasional tremors     PAST SURGICAL Kimberly: Past Surgical Kimberly:  Procedure Laterality Date  . VAGINAL HYSTERECTOMY      FAMILY Kimberly: Family Kimberly  Problem Relation Age of Onset  . Hypertension Mother   . Heart attack Father   . Breast cancer Neg Hx     SOCIAL Kimberly: Social Kimberly   Socioeconomic Kimberly  . Marital status: Widowed    Spouse name: Not on file  . Number of children: 2  . Years of education: Not on file  . Highest education level: Not on file  Occupational Kimberly  . Occupation: retired  Tobacco Use  . Smoking status: Never Smoker  . Smokeless tobacco: Never Used  Substance and Sexual Activity  . Alcohol use: No  . Drug use: No  . Sexual activity: Not Currently  Other Topics Concern  . Not on file  Social Kimberly Narrative   Drinks 1-2 caffeine drinks a day    Social Determinants of Health   Financial Resource Strain: Low Risk    . Difficulty of Paying Living Expenses: Not hard at all  Food Insecurity: No Food Insecurity  . Worried About Charity fundraiser in the Last Year: Never true  . Ran Out of Food in the Last Year: Never true  Transportation Needs: No Transportation Needs  . Lack of Transportation (Medical): No  . Lack of Transportation (Non-Medical): No  Physical Activity: Inactive  . Days of Exercise per Week: 0 days  . Minutes of  Exercise per Session: 0 min  Stress: No Stress Concern Present  . Feeling of Stress : Not at all  Social Connections: Unknown  . Frequency of Communication with Friends and Family: More than three times a week  . Frequency of Social Gatherings with Friends and Family: More than three times a week  . Attends Religious Services: Not on file  . Active Member of Clubs or Organizations: Not on file  . Attends Archivist Meetings: Not on file  . Marital Status: Married  Human resources officer Violence:   . Fear of Current or Ex-Partner:   . Emotionally Abused:   Marland Kitchen Physically Abused:   . Sexually Abused:       PHYSICAL EXAM  Vitals:   09/29/19 1059  BP: 128/72  Pulse: 88  Temp: (!) 97.3 F (36.3 C)  Weight: 276 lb (125.2 kg)  Height: '5\' 3"'$  (1.6 m)   There is no height or weight on file to calculate BMI.  Generalized: Well developed, in no acute distress  Chest: Lungs clear to auscultation bilaterally  Neurological examination  Mentation: Alert oriented to time, place, Kimberly taking. Follows all commands speech and language fluent Cranial nerve II-XII: Extraocular movements were full, visual field were full on confrontational test Head turning and shoulder shrug  were normal and symmetric. Motor: The motor testing reveals 5 over 5 strength of all 4 extremities. Good symmetric motor tone is noted throughout.  Sensory: Sensory testing is intact to soft touch on all 4 extremities. No evidence of extinction is noted.  Gait and station: Gait is normal.     DIAGNOSTIC DATA (LABS, IMAGING, TESTING) - I reviewed patient records, labs, notes, testing and imaging myself where available.  Lab Results  Component Value Date   WBC 9.2 09/07/2019   HGB 13.2 09/07/2019   HCT 41.0 09/07/2019   MCV 92 09/07/2019   PLT 288 09/07/2019      Component Value Date/Time   NA 143 09/07/2019 1217   K 4.5 09/07/2019 1217   CL 101 09/07/2019 1217   CO2 28 09/07/2019 1217   GLUCOSE 228 (H) 09/07/2019 1217   GLUCOSE 84 09/10/2010 0650   BUN 43 (H) 09/07/2019 1217   CREATININE 2.15 (H) 09/07/2019 1217   CALCIUM 10.2 09/07/2019 1217   PROT 7.4 09/07/2019 1217   ALBUMIN 4.8 09/07/2019 1217   AST 21 09/07/2019 1217   ALT 18 09/07/2019 1217   ALKPHOS 87 09/07/2019 1217   BILITOT <0.2 09/07/2019 1217   GFRNONAA 23 (L) 09/07/2019 1217   GFRAA 27 (L) 09/07/2019 1217   Lab Results  Component Value Date   CHOL 206 (H) 09/07/2019   HDL 55 09/07/2019   LDLCALC 104 (H) 09/07/2019   TRIG 277 (H) 09/07/2019   CHOLHDL 3.7 09/07/2019   Lab Results  Component Value Date   HGBA1C 6.7 (H) 09/07/2019   No results found for: [id number] Lab Results  Component Value Date   TSH 1.610 09/01/2018      ASSESSMENT AND PLAN 69 y.o. year old female  has a past medical Kimberly of Anemia, Anxiety, Bipolar 1 disorder ([place]), Depression, Diabetes mellitus without complication ([place]), Hyperlipemia, Hypertension, and Occasional tremors. here with:  1. OSA on CPAP  - CPAP compliance is suboptimal - Good treatment of AHI  - Encourage patient to use CPAP nightly and > 4 hours each night - F/U in 1 year or sooner if needed   I spent 15 minutes of face-to-face and non-face-to-face time with patient.  This included previsit chart review, lab review, study review, order entry, electronic health record documentation, patient education.  Ward Givens, MSN, NP-C 09/29/2019, 10:47 AM [facility] [address] [place],  [address] [phone]  I reviewed the above note and documentation by the Nurse Practitioner and agree with the Kimberly, exam, assessment and plan as outlined above. I was available for consultation. Star Age, MD, PhD [facility])

## 2019-10-03 ENCOUNTER — Ambulatory Visit (INDEPENDENT_AMBULATORY_CARE_PROVIDER_SITE_OTHER): Payer: Medicare HMO | Admitting: Pharmacist

## 2019-10-03 DIAGNOSIS — E119 Type 2 diabetes mellitus without complications: Secondary | ICD-10-CM | POA: Diagnosis not present

## 2019-10-03 NOTE — Progress Notes (Signed)
  Chronic Care Management   Outreach Note  10/03/2019 Name: Kimberly Gross MRN: [id number] DOB: 1951-02-07  Referred by: [name], FNP Reason for referral : Chronic Care Management and Diabetes   An unsuccessful telephone outreach was attempted today. The patient was referred to the case management team for assistance with care management and care coordination.   Follow Up Plan: A HIPPA compliant phone message was left for the patient providing contact information and requesting a return call.   SIGNATURE Regina Eck, PharmD, BCPS Clinical Pharmacist, [facility] [place]: [phone]

## 2019-10-12 ENCOUNTER — Other Ambulatory Visit: Payer: Self-pay | Admitting: Nurse Practitioner

## 2019-10-12 ENCOUNTER — Telehealth: Payer: Self-pay

## 2019-10-12 DIAGNOSIS — E119 Type 2 diabetes mellitus without complications: Secondary | ICD-10-CM

## 2019-10-12 NOTE — Telephone Encounter (Signed)
Pt called wanting to move appt date up, called pt back LVM for her to call the office back

## 2019-10-19 ENCOUNTER — Other Ambulatory Visit: Payer: Self-pay | Admitting: Internal Medicine

## 2019-10-20 ENCOUNTER — Telehealth: Payer: Self-pay

## 2019-10-20 DIAGNOSIS — H25013 Cortical age-related cataract, bilateral: Secondary | ICD-10-CM | POA: Diagnosis not present

## 2019-10-20 DIAGNOSIS — H3589 Other specified retinal disorders: Secondary | ICD-10-CM | POA: Diagnosis not present

## 2019-10-20 DIAGNOSIS — H35342 Macular cyst, hole, or pseudohole, left eye: Secondary | ICD-10-CM | POA: Diagnosis not present

## 2019-10-20 DIAGNOSIS — H4089 Other specified glaucoma: Secondary | ICD-10-CM | POA: Diagnosis not present

## 2019-10-20 DIAGNOSIS — H40031 Anatomical narrow angle, right eye: Secondary | ICD-10-CM | POA: Diagnosis not present

## 2019-10-20 DIAGNOSIS — H2513 Age-related nuclear cataract, bilateral: Secondary | ICD-10-CM | POA: Diagnosis not present

## 2019-10-20 DIAGNOSIS — H43822 Vitreomacular adhesion, left eye: Secondary | ICD-10-CM | POA: Diagnosis not present

## 2019-10-20 DIAGNOSIS — E119 Type 2 diabetes mellitus without complications: Secondary | ICD-10-CM | POA: Diagnosis not present

## 2019-10-20 DIAGNOSIS — Z794 Long term (current) use of insulin: Secondary | ICD-10-CM | POA: Diagnosis not present

## 2019-10-24 ENCOUNTER — Telehealth: Payer: Self-pay

## 2019-10-24 NOTE — Telephone Encounter (Signed)
Patient called stating she is almost out of her insulin and wanted to know if we had her medicine. She requested that we  Call her daughter's phone at [phone]   I returned her call and left her a vm pt has her medicine here at the office ready for pickup. YL,RMA

## 2019-10-25 ENCOUNTER — Other Ambulatory Visit: Payer: Self-pay

## 2019-10-25 MED ORDER — FENOFIBRATE 160 MG PO TABS: 160.0000 mg | ORAL_TABLET | Freq: Every day | ORAL | 0 refills | Status: DC

## 2019-10-26 ENCOUNTER — Other Ambulatory Visit: Payer: Self-pay | Admitting: Nurse Practitioner

## 2019-10-26 ENCOUNTER — Encounter (INDEPENDENT_AMBULATORY_CARE_PROVIDER_SITE_OTHER): Payer: Medicare HMO | Admitting: Ophthalmology

## 2019-10-26 DIAGNOSIS — I10 Essential (primary) hypertension: Secondary | ICD-10-CM | POA: Diagnosis not present

## 2019-10-26 DIAGNOSIS — H43813 Vitreous degeneration, bilateral: Secondary | ICD-10-CM

## 2019-10-26 DIAGNOSIS — H43822 Vitreomacular adhesion, left eye: Secondary | ICD-10-CM | POA: Diagnosis not present

## 2019-10-26 DIAGNOSIS — H35033 Hypertensive retinopathy, bilateral: Secondary | ICD-10-CM | POA: Diagnosis not present

## 2019-10-26 DIAGNOSIS — H3570 Unspecified separation of retinal layers: Secondary | ICD-10-CM | POA: Diagnosis not present

## 2019-10-27 ENCOUNTER — Ambulatory Visit: Payer: Medicare HMO

## 2019-11-01 DIAGNOSIS — H40023 Open angle with borderline findings, high risk, bilateral: Secondary | ICD-10-CM | POA: Diagnosis not present

## 2019-11-01 DIAGNOSIS — E119 Type 2 diabetes mellitus without complications: Secondary | ICD-10-CM | POA: Diagnosis not present

## 2019-11-01 DIAGNOSIS — H43822 Vitreomacular adhesion, left eye: Secondary | ICD-10-CM | POA: Diagnosis not present

## 2019-11-01 DIAGNOSIS — H2513 Age-related nuclear cataract, bilateral: Secondary | ICD-10-CM | POA: Diagnosis not present

## 2019-11-01 DIAGNOSIS — Z794 Long term (current) use of insulin: Secondary | ICD-10-CM | POA: Diagnosis not present

## 2019-11-01 DIAGNOSIS — H40033 Anatomical narrow angle, bilateral: Secondary | ICD-10-CM | POA: Diagnosis not present

## 2019-11-01 DIAGNOSIS — H35342 Macular cyst, hole, or pseudohole, left eye: Secondary | ICD-10-CM | POA: Diagnosis not present

## 2019-11-10 DIAGNOSIS — F250 Schizoaffective disorder, bipolar type: Secondary | ICD-10-CM | POA: Diagnosis not present

## 2019-11-27 ENCOUNTER — Other Ambulatory Visit: Payer: Self-pay | Admitting: Nurse Practitioner

## 2019-12-02 ENCOUNTER — Telehealth: Payer: Self-pay

## 2019-12-02 ENCOUNTER — Ambulatory Visit: Payer: Self-pay

## 2019-12-02 ENCOUNTER — Other Ambulatory Visit: Payer: Self-pay

## 2019-12-02 DIAGNOSIS — E782 Mixed hyperlipidemia: Secondary | ICD-10-CM

## 2019-12-02 DIAGNOSIS — E119 Type 2 diabetes mellitus without complications: Secondary | ICD-10-CM

## 2019-12-02 DIAGNOSIS — R29898 Other symptoms and signs involving the musculoskeletal system: Secondary | ICD-10-CM

## 2019-12-02 DIAGNOSIS — I10 Essential (primary) hypertension: Secondary | ICD-10-CM

## 2019-12-05 ENCOUNTER — Other Ambulatory Visit: Payer: Self-pay

## 2019-12-05 ENCOUNTER — Ambulatory Visit (INDEPENDENT_AMBULATORY_CARE_PROVIDER_SITE_OTHER): Payer: Medicare HMO | Admitting: Nurse Practitioner

## 2019-12-05 ENCOUNTER — Encounter: Payer: Self-pay | Admitting: Nurse Practitioner

## 2019-12-05 VITALS — BP 156/88 | HR 96 | Temp 98.0°F | Ht 63.0 in | Wt 278.0 lb

## 2019-12-05 DIAGNOSIS — F3132 Bipolar disorder, current episode depressed, moderate: Secondary | ICD-10-CM | POA: Diagnosis not present

## 2019-12-05 DIAGNOSIS — E782 Mixed hyperlipidemia: Secondary | ICD-10-CM

## 2019-12-05 DIAGNOSIS — E119 Type 2 diabetes mellitus without complications: Secondary | ICD-10-CM | POA: Diagnosis not present

## 2019-12-05 DIAGNOSIS — I10 Essential (primary) hypertension: Secondary | ICD-10-CM

## 2019-12-05 MED ORDER — XULTOPHY 100-3.6 UNIT-MG/ML ~~LOC~~ SOPN: 30.0000 [IU] | PEN_INJECTOR | Freq: Every day | SUBCUTANEOUS | 6 refills | Status: DC

## 2019-12-05 NOTE — Progress Notes (Signed)
This visit occurred during the SARS-CoV-2 public health emergency.  Safety protocols were in place, including screening questions prior to the visit, additional usage of staff PPE, and extensive cleaning of exam room while observing appropriate contact time as indicated for disinfecting solutions.  Subjective:     Patient ID: Kimberly Gross , female    DOB: 1950/08/02 , 69 y.o.   MRN: [id number]   Chief Complaint  Patient presents with  . Diabetes  . Hypertension    HPI  She reports she misplaced her glucometer.  She is currently sleeping on the couch due to her bed is broke.    Wt Readings from Last 3 Encounters: 12/05/19 : 278 lb (126.1 kg) 09/29/19 : 276 lb (125.2 kg) 09/20/19 : 272 lb (123.4 kg)   Diabetes She presents for her follow-up diabetic visit. She has type 2 diabetes mellitus. No MedicAlert identification noted. Her disease course has been stable. Pertinent negatives for hypoglycemia include no dizziness or headaches. Pertinent negatives for diabetes include no blurred vision, no chest pain, no fatigue, no polydipsia, no polyphagia and no polyuria. There are no hypoglycemic complications. Symptoms are stable. There are no diabetic complications. Pertinent negatives for diabetic complications include no CVA. Risk factors for coronary artery disease include obesity and sedentary lifestyle. Current diabetic treatment includes insulin injections and oral agent (dual therapy) (Xultophy 30 units daily.  ). She is compliant with treatment all of the time. Her weight is stable. She is following a generally healthy diet. When asked about meal planning, she reported none. She has had a previous visit with a dietitian. She participates in exercise three times a week. Her home blood glucose trend is decreasing steadily. Her overall blood glucose range is 140-180 mg/dl. (Blood sugars from 120-140) An ACE inhibitor/angiotensin II receptor blocker is being taken. She does not see a  podiatrist.Eye exam is current.  Hypertension This is a chronic problem. The current episode started more than 1 year ago. The problem is unchanged. The problem is controlled. Pertinent negatives include no blurred vision, chest pain, headaches, palpitations or shortness of breath. There are no associated agents to hypertension. Risk factors for coronary artery disease include sedentary lifestyle, obesity, dyslipidemia and diabetes mellitus. Past treatments include ACE inhibitors. The current treatment provides no improvement. There are no compliance problems.  There is no history of angina or CVA. There is no history of chronic renal disease.    Past Medical History:  Diagnosis Date  . Anemia   . Anxiety   . Bipolar 1 disorder ([place])   . CKD (chronic kidney disease), stage IV ([place])   . Depression   . Diabetes mellitus without complication ([place])   . Hyperlipemia   . Hypertension   . Occasional tremors      Family History  Problem Relation Age of Onset  . Hypertension Mother   . Heart attack Father   . Breast cancer Neg Hx      Current Outpatient Medications:  .  ACCU-CHEK AVIVA PLUS test strip, TEST BLOOD SUGAR TWICE DAILY, Disp: 200 strip, Rfl: 3 .  Accu-Chek Softclix Lancets lancets, USE AS DIRECTED  TO CHECK BLOOD GLUCOSE, Disp: 200 each, Rfl: 3 .  acetaminophen (TYLENOL) 500 MG tablet, Take 1,000 mg by mouth 2 (two) times a day., Disp: , Rfl:  .  Alcohol Swabs (B-D SINGLE USE SWABS REGULAR) PADS, Use as directed to check blood sugars twice daily, Disp: 100 each, Rfl: 5 .  amLODipine (NORVASC) 10 MG tablet, TAKE  1 TABLET EVERY DAY, Disp: 90 tablet, Rfl: 0 .  aspirin 81 MG tablet, Take 81 mg by mouth daily. , Disp: , Rfl:  .  atorvastatin (LIPITOR) 40 MG tablet, TAKE 1 TABLET BY MOUTH EVERYDAY AT BEDTIME, Disp: 90 tablet, Rfl: 0 .  Blood Glucose Monitoring Suppl (ACCU-CHEK AVIVA PLUS) w/Device KIT, Check blood sugar twice a day, Disp: 1 kit, Rfl: 0 .  carvedilol (COREG) 6.25 MG  tablet, Take 1 tablet (6.25 mg total) by mouth 2 (two) times daily., Disp: 180 tablet, Rfl: 3 .  clonazePAM (KLONOPIN) 0.5 MG tablet, Take 1 tablet (0.5 mg total) by mouth 2 (two) times daily., Disp: 30 tablet, Rfl: 0 .  divalproex (DEPAKOTE) 500 MG DR tablet, 500 mg. 2 pills 2 times per day, Disp: , Rfl:  .  DM-APAP-CPM (CORICIDIN HBP PO), Take by mouth as needed., Disp: , Rfl:  .  fenofibrate 160 MG tablet, Take 1 tablet (160 mg total) by mouth daily., Disp: 90 tablet, Rfl: 0 .  ferrous sulfate 325 (65 FE) MG tablet, Take 325 mg by mouth daily with breakfast., Disp: , Rfl:  .  furosemide (LASIX) 40 MG tablet, TAKE 1 TABLET ON MONDAY, WEDNESDAY AND FRIDAY, Disp: 39 tablet, Rfl: 3 .  hydrALAZINE (APRESOLINE) 50 MG tablet, Take 1 tablet (50 mg total) by mouth 3 (three) times daily., Disp: 270 tablet, Rfl: 1 .  Insulin Pen Needle (DROPLET PEN NEEDLES) 32G X 4 MM MISC, USE WITH XULTOPHY ONE TIME DAILY., Disp: 100 each, Rfl: 2 .  loratadine (CLARITIN) 10 MG tablet, Take 1 tablet (10 mg total) by mouth daily. (Patient taking differently: Take 10 mg by mouth as needed. ), Disp: 30 tablet, Rfl: 1 .  Multiple Vitamins-Minerals (MULTIVITAMIN ADULT PO), Use as directed 1 tablet in the mouth or throat daily., Disp: , Rfl:  .  vitamin B-12 (CYANOCOBALAMIN) 250 MCG tablet, Take 250 mcg by mouth daily., Disp: , Rfl:  .  XULTOPHY 100-3.6 UNIT-MG/ML SOPN, INJECT 20 UNITS BY SUBCUTANEOUS ROUTE EVERY DAY (Patient taking differently: Inject 30 Units into the skin daily. ), Disp: 15 pen, Rfl: 2   Allergies  Allergen Reactions  . Chlorpromazine Rash     Review of Systems  Constitutional: Negative.  Negative for fatigue.  Eyes: Negative for blurred vision.  Respiratory: Negative.  Negative for shortness of breath.   Cardiovascular: Negative for chest pain, palpitations and leg swelling.  Endocrine: Negative for polydipsia, polyphagia and polyuria.  Skin: Negative.   Neurological: Negative for dizziness and  headaches.  Hematological: Negative.   Psychiatric/Behavioral: Negative.      Today's Vitals   12/05/19 1047  BP: (!) 156/88  Pulse: 96  Temp: 98 F (36.7 C)  Weight: 278 lb (126.1 kg)  Height: _0  (1.6 m)   Body mass index is 49.25 kg/m.   Objective:  Physical Exam Constitutional:      General: She is not in acute distress.    Appearance: Normal appearance. She is well-developed. She is obese.  HENT:     Right Ear: Hearing normal.     Left Ear: Hearing normal.  Eyes:     General: Lids are normal.     Extraocular Movements: Extraocular movements intact.     Conjunctiva/sclera: Conjunctivae normal.     Pupils: Pupils are equal, round, and reactive to light.     Funduscopic exam:    Right eye: No papilledema.        Left eye: No papilledema.  Neck:  Thyroid: No thyroid mass.     Vascular: No carotid bruit.  Cardiovascular:     Rate and Rhythm: Normal rate and regular rhythm.     Pulses: Normal pulses.     Heart sounds: Normal heart sounds. No murmur.  Pulmonary:     Effort: Pulmonary effort is normal. No respiratory distress.     Breath sounds: Normal breath sounds.  Musculoskeletal:     Cervical back: Full passive range of motion without pain, normal range of motion and neck supple.  Skin:    General: Skin is warm and dry.     Capillary Refill: Capillary refill takes less than 2 seconds.  Neurological:     General: No focal deficit present.     Mental Status: She is alert and oriented to person, place, and time.     Cranial Nerves: No cranial nerve deficit.     Sensory: No sensory deficit.  Psychiatric:        Mood and Affect: Mood normal.        Behavior: Behavior normal.        Thought Content: Thought content normal.        Judgment: Judgment normal.         Assessment And Plan:    1. Type 2 diabetes mellitus without complication, without long-term current use of insulin (HCC)  Chronic, she is doing well on Xultophy  Will check HgbA1c. -  CMP14+EGFR - Lipid Panel - Hemoglobin A1c   2. Mixed hyperlipidemia  Chronic, controlled  Continue with current medications - Lipid panel  3. Morbid (severe) obesity due to excess calories (HCC)  Chronic, discussed cutting back on junk food  Weight is stable  4. Bipolar affective disorder, currently depressed, moderate (HCC)  Chronic, stable, continue follow up with Behavioral Health  5. Essential hypertension  Chronic, fair control  Discussed importance of continuing to eat a low salt diet.  She is to provide [place] with her dates she had her covid vaccine  [name], FNP    THE PATIENT IS ENCOURAGED TO PRACTICE SOCIAL DISTANCING DUE TO THE COVID-19 PANDEMIC.

## 2019-12-05 NOTE — Chronic Care Management (AMB) (Signed)
  Chronic Care Management   Outreach Note  12/05/2019 Name: Kimberly Gross MRN: [id number] DOB: [birth year]  Referred by: [name], FNP Reason for referral : Chronic Care Management (FU RN CM Call )   An unsuccessful telephone outreach was attempted today. The patient was referred to the case management team for assistance with care management and care coordination.   Follow Up Plan: Telephone follow up appointment with care management team member scheduled for: 01/04/20  Barb Merino, RN, BSN, CCM Care Management Coordinator [facility]  Direct Phone: [phone]

## 2019-12-06 LAB — LIPID PANEL
Chol/HDL Ratio: 3.3 ratio (ref 0.0–4.4)
Cholesterol, Total: 174 mg/dL (ref 100–199)
HDL: 53 mg/dL (ref >39)
LDL Chol Calc (NIH): 82 mg/dL (ref 0–99)
Triglycerides: 238 mg/dL — ABNORMAL HIGH (ref 0–149)
VLDL Cholesterol Cal: 39 mg/dL (ref 5–40)

## 2019-12-06 LAB — CMP14+EGFR
ALT: 19 IU/L (ref 0–32)
AST: 21 IU/L (ref 0–40)
Albumin/Globulin Ratio: 2 (ref 1.2–2.2)
Albumin: 4.7 g/dL (ref 3.8–4.8)
Alkaline Phosphatase: 77 IU/L (ref 48–121)
BUN/Creatinine Ratio: 23 (ref 12–28)
BUN: 51 mg/dL — ABNORMAL HIGH (ref 8–27)
Bilirubin Total: 0.3 mg/dL (ref 0.0–1.2)
CO2: 24 mmol/L (ref 20–29)
Calcium: 9.8 mg/dL (ref 8.7–10.3)
Chloride: 104 mmol/L (ref 96–106)
Creatinine, Ser: 2.23 mg/dL — ABNORMAL HIGH (ref 0.57–1.00)
GFR calc Af Amer: 25 mL/min/{1.73_m2} — ABNORMAL LOW (ref >59)
GFR calc non Af Amer: 22 mL/min/{1.73_m2} — ABNORMAL LOW (ref >59)
Globulin, Total: 2.3 g/dL (ref 1.5–4.5)
Glucose: 197 mg/dL — ABNORMAL HIGH (ref 65–99)
Potassium: 4.7 mmol/L (ref 3.5–5.2)
Sodium: 142 mmol/L (ref 134–144)
Total Protein: 7 g/dL (ref 6.0–8.5)

## 2019-12-06 LAB — HEMOGLOBIN A1C
Est. average glucose Bld gHb Est-mCnc: 140 mg/dL
Hgb A1c MFr Bld: 6.5 % — ABNORMAL HIGH (ref 4.8–5.6)

## 2019-12-08 DIAGNOSIS — H268 Other specified cataract: Secondary | ICD-10-CM | POA: Diagnosis not present

## 2019-12-08 DIAGNOSIS — H25812 Combined forms of age-related cataract, left eye: Secondary | ICD-10-CM | POA: Diagnosis not present

## 2019-12-22 ENCOUNTER — Other Ambulatory Visit: Payer: Self-pay

## 2019-12-22 ENCOUNTER — Ambulatory Visit: Payer: Medicare HMO | Admitting: Internal Medicine

## 2019-12-22 ENCOUNTER — Encounter: Payer: Self-pay | Admitting: Internal Medicine

## 2019-12-22 VITALS — BP 150/96 | HR 85 | Ht 63.0 in | Wt 283.0 lb

## 2019-12-22 DIAGNOSIS — E785 Hyperlipidemia, unspecified: Secondary | ICD-10-CM | POA: Diagnosis not present

## 2019-12-22 DIAGNOSIS — N184 Chronic kidney disease, stage 4 (severe): Secondary | ICD-10-CM | POA: Diagnosis not present

## 2019-12-22 DIAGNOSIS — I10 Essential (primary) hypertension: Secondary | ICD-10-CM | POA: Diagnosis not present

## 2019-12-22 DIAGNOSIS — E119 Type 2 diabetes mellitus without complications: Secondary | ICD-10-CM | POA: Diagnosis not present

## 2019-12-22 DIAGNOSIS — G4733 Obstructive sleep apnea (adult) (pediatric): Secondary | ICD-10-CM | POA: Diagnosis not present

## 2019-12-22 MED ORDER — CARVEDILOL 12.5 MG PO TABS
12.5000 mg | ORAL_TABLET | Freq: Two times a day (BID) | ORAL | 3 refills | Status: DC
Start: 2019-12-22 — End: 2020-04-10

## 2019-12-22 NOTE — Patient Instructions (Signed)
Medication Instructions:   increase Carvedilol 12.5 mg  One tablet twice a day   *If you need a refill on your cardiac medications before your next appointment, please call your pharmacy*   Lab Work: Not needed   Testing/Procedures:not needed   Follow-Up: At [facility], you and your health needs are our priority.  As part of our continuing mission to provide you with exceptional heart care, we have created designated Provider Care Teams.  These Care Teams include your primary Cardiologist (physician) and Advanced Practice Providers (APPs -  Physician Assistants and Nurse Practitioners) who all work together to provide you with the care you need, when you need it.  We recommend signing up for the patient portal called "MyChart".  Sign up information is provided on this After Visit Summary.  MyChart is used to connect with patients for Virtual Visits (Telemedicine).  Patients are able to view lab/test results, encounter notes, upcoming appointments, etc.  Non-urgent messages can be sent to your provider as well.   To learn more about what you can do with MyChart, go to [email or web address].    Your next appointment:   4 month(s)  The format for your next appointment:   In Person  Provider:   Cherlynn Kaiser, MD   Other Instructions Your physician recommends that you schedule a follow-up appointment in 1 month with CVRR - lipids , blood pressure

## 2019-12-22 NOTE — Progress Notes (Signed)
Cardiology Office Note:    Date:  09/20/2019   ID:  [name], DOB 1951-06-12, MRN [id number]  PCP:  [name], FNP  Cardiologist:  Elouise Munroe, MD  Electrophysiologist:  None   Referring MD: [name], FNP   Chief Complaint: f/u HTN  History of Present Illness:    Kimberly Gross is a 69 y.o. female with a history of hypertension, diabetes, chronic kidney disease stage IV, bipolar 1 disorder. Follow up today for HTN.  BP control is still suboptimal. We discussed med uptitration. BP at home ranges from 135-160 mmHg/90 mmHg. Continues to have life stressors with her children and grandchildren.   She is very thankful for the involvement of our social worker Kennyth Lose and CMA Tiwan for helping her obtain bunk beds for her granddaughters, so that they do not have to share a bed with her. She is sleeping better now.   Past Medical History:  Diagnosis Date  . Anemia   . Anxiety   . Bipolar 1 disorder ([place])   . CKD (chronic kidney disease), stage IV ([place])   . Depression   . Diabetes mellitus without complication ([place])   . Hyperlipemia   . Hypertension   . Occasional tremors     Past Surgical History:  Procedure Laterality Date  . VAGINAL HYSTERECTOMY      Current Medications: Current Meds  Medication Sig  . ACCU-CHEK AVIVA PLUS test strip TEST BLOOD SUGAR TWICE DAILY  . Accu-Chek Softclix Lancets lancets USE AS DIRECTED  TO CHECK BLOOD GLUCOSE  . acetaminophen (TYLENOL) 500 MG tablet Take 1,000 mg by mouth 2 (two) times a day.  . Alcohol Swabs (B-D SINGLE USE SWABS REGULAR) PADS Use as directed to check blood sugars twice daily  . amLODipine (NORVASC) 10 MG tablet TAKE 1 TABLET EVERY DAY  . aspirin 81 MG tablet Take 81 mg by mouth daily.   Marland Kitchen atorvastatin (LIPITOR) 40 MG tablet TAKE 1 TABLET BY MOUTH EVERYDAY AT BEDTIME  . Blood Glucose Monitoring Suppl (ACCU-CHEK AVIVA PLUS) w/Device KIT Check blood sugar twice a day  . clonazePAM (KLONOPIN) 0.5 MG tablet  Take 1 tablet (0.5 mg total) by mouth 2 (two) times daily.  . divalproex (DEPAKOTE) 500 MG DR tablet 500 mg. 2 pills 2 times per day  . DM-APAP-CPM (CORICIDIN HBP PO) Take by mouth as needed.  . fenofibrate 160 MG tablet Take 1 tablet (160 mg total) by mouth daily.  . ferrous sulfate 325 (65 FE) MG tablet Take 325 mg by mouth daily with breakfast.  . furosemide (LASIX) 40 MG tablet TAKE 1 TABLET ON MONDAY, WEDNESDAY AND FRIDAY  . hydrALAZINE (APRESOLINE) 50 MG tablet Take 1 tablet (50 mg total) by mouth 3 (three) times daily.  . Insulin Degludec-Liraglutide (XULTOPHY) 100-3.6 UNIT-MG/ML SOPN Inject 30 Units into the skin daily.  . Insulin Pen Needle (DROPLET PEN NEEDLES) 32G X 4 MM MISC USE WITH XULTOPHY ONE TIME DAILY.  Marland Kitchen loratadine (CLARITIN) 10 MG tablet Take 1 tablet (10 mg total) by mouth daily. (Patient taking differently: Take 10 mg by mouth as needed. )  . Multiple Vitamins-Minerals (MULTIVITAMIN ADULT PO) Use as directed 1 tablet in the mouth or throat daily.  . Prednisolon-Gatiflox-Bromfenac 1-0.5-0.075 % SUSP Place 1 drop into the left eye 4 (four) times daily.  . traZODone (DESYREL) 50 MG tablet   . vitamin B-12 (CYANOCOBALAMIN) 250 MCG tablet Take 250 mcg by mouth daily.  . [DISCONTINUED] carvedilol (COREG) 6.25 MG tablet Take 1  tablet (6.25 mg total) by mouth 2 (two) times daily.  . [DISCONTINUED] hydrALAZINE (APRESOLINE) 25 MG tablet      Allergies:   Chlorpromazine   Social History   Socioeconomic History  . Marital status: Widowed    Spouse name: Not on file  . Number of children: 2  . Years of education: Not on file  . Highest education level: Not on file  Occupational History  . Occupation: retired  Tobacco Use  . Smoking status: Never Smoker  . Smokeless tobacco: Never Used  Vaping Use  . Vaping Use: Never used  Substance and Sexual Activity  . Alcohol use: No  . Drug use: No  . Sexual activity: Not Currently  Other Topics Concern  . Not on file  Social  History Narrative   Drinks 1-2 caffeine drinks a day    Her daughter & 4 grandchildren are living with her   Social Determinants of Health   Financial Resource Strain: Low Risk   . Difficulty of Paying Living Expenses: Not hard at all  Food Insecurity: No Food Insecurity  . Worried About Charity fundraiser in the Last Year: Never true  . Ran Out of Food in the Last Year: Never true  Transportation Needs: No Transportation Needs  . Lack of Transportation (Medical): No  . Lack of Transportation (Non-Medical): No  Physical Activity: Inactive  . Days of Exercise per Week: 0 days  . Minutes of Exercise per Session: 0 min  Stress: No Stress Concern Present  . Feeling of Stress : Not at all  Social Connections:   . Frequency of Communication with Friends and Family:   . Frequency of Social Gatherings with Friends and Family:   . Attends Religious Services:   . Active Member of Clubs or Organizations:   . Attends Archivist Meetings:   Marland Kitchen Marital Status:      Family History: The patient's family history includes Heart attack in her father; Hypertension in her mother. There is no history of Breast cancer.  ROS:   Please see the history of present illness.    All other systems reviewed and are negative.  EKGs/Labs/Other Studies Reviewed:    The following studies were reviewed today:  EKG:  SR with 1st deg AVB, left axis deviation, RBBB, moderate LVH, inferior infarct pattern.  Recent Labs: 09/07/2019: Hemoglobin 13.2; Platelets 288 12/05/2019: ALT 19; BUN 51; Creatinine, Ser 2.23; Potassium 4.7; Sodium 142  Recent Lipid Panel    Component Value Date/Time   CHOL 174 12/05/2019 1138   TRIG 238 (H) 12/05/2019 1138   HDL 53 12/05/2019 1138   CHOLHDL 3.3 12/05/2019 1138   CHOLHDL 2.7 08/23/2010 2238   VLDL 12 08/23/2010 2238   LDLCALC 82 12/05/2019 1138    Physical Exam:    VS:  BP (!) 150/96   Pulse 85   Ht '5\' 3"'$  (1.6 m)   Wt 283 lb (128.4 kg)   SpO2 95%   BMI  50.13 kg/m     Wt Readings from Last 5 Encounters:  12/22/19 283 lb (128.4 kg)  12/05/19 278 lb (126.1 kg)  09/29/19 276 lb (125.2 kg)  09/20/19 272 lb (123.4 kg)  09/07/19 274 lb (124.3 kg)     Constitutional: No acute distress Eyes: sclera non-icteric, normal conjunctiva and lids ENMT: normal dentition, moist mucous membranes Cardiovascular: regular rhythm, normal rate, 1/6 SEM. S1 and S2 normal. Radial pulses normal bilaterally. No jugular venous distention.  Respiratory: clear to auscultation  bilaterally GI : normal bowel sounds, soft and nontender. No distention.   MSK: extremities warm, well perfused. No edema.  NEURO: grossly nonfocal exam, moves all extremities. PSYCH: alert and oriented x 3, normal mood and affect.   ASSESSMENT:    1. Essential hypertension   2. Hyperlipidemia, unspecified hyperlipidemia type   3. OSA (obstructive sleep apnea)   4. Type 2 diabetes mellitus without complication, without long-term current use of insulin ([place])   5. CKD (chronic kidney disease) stage 4, GFR 15-29 ml/min ([facility])    PLAN:    HTN - control is still suboptimal, we will need to increase carvedilol to 12.5 mg BID. Continue amloidipine, hydralazine, and lasix MWF.  HLD - triglycerides remain elevated on atorvastatin and fibrate. Needs continued control of blood glucose. May need additional therapy for elevated triglycerides.  Will have her visit with pharmacist lipid clinic, and at that visit BP can be rechecked.   DM2 - from cardiovascular perspective, keep BP <140/90 and LDL <70.  Mild AS - will repeat echo next year.   Total time of encounter: 30 minutes total time of encounter, including 20 minutes spent in face-to-face patient care, on the date of this encounter. This time includes coordination of care and counseling regarding above mentioned problem list. Remainder of non-face-to-face time involved reviewing chart documents/testing relevant to the patient encounter and  documentation in the medical record. I have independently reviewed documentation from referring provider.   Cherlynn Kaiser, MD [facility]    Medication Adjustments/Labs and Tests Ordered: Current medicines are reviewed at length with the patient today.  Concerns regarding medicines are outlined above.  Orders Placed This Encounter  Procedures  . EKG 12-Lead   Meds ordered this encounter  Medications  . carvedilol (COREG) 12.5 MG tablet    Sig: Take 1 tablet (12.5 mg total) by mouth 2 (two) times daily.    Dispense:  180 tablet    Refill:  3    Discontinue 6.25 mg dosage    Patient Instructions  Medication Instructions:   increase Carvedilol 12.5 mg  One tablet twice a day   *If you need a refill on your cardiac medications before your next appointment, please call your pharmacy*   Lab Work: Not needed   Testing/Procedures:not needed   Follow-Up: At [facility], you and your health needs are our priority.  As part of our continuing mission to provide you with exceptional heart care, we have created designated Provider Care Teams.  These Care Teams include your primary Cardiologist (physician) and Advanced Practice Providers (APPs -  Physician Assistants and Nurse Practitioners) who all work together to provide you with the care you need, when you need it.  We recommend signing up for the patient portal called "MyChart".  Sign up information is provided on this After Visit Summary.  MyChart is used to connect with patients for Virtual Visits (Telemedicine).  Patients are able to view lab/test results, encounter notes, upcoming appointments, etc.  Non-urgent messages can be sent to your provider as well.   To learn more about what you can do with MyChart, go to [email or web address].    Your next appointment:   4 month(s)  The format for your next appointment:   In Person  Provider:   Cherlynn Kaiser, MD   Other Instructions Your physician  recommends that you schedule a follow-up appointment in 1 month with CV[facility] - lipids , blood pressure

## 2020-01-04 ENCOUNTER — Telehealth: Payer: Self-pay

## 2020-01-11 ENCOUNTER — Telehealth: Payer: Self-pay

## 2020-01-30 ENCOUNTER — Other Ambulatory Visit: Payer: Self-pay

## 2020-01-30 MED ORDER — AMLODIPINE BESYLATE 10 MG PO TABS: 10.0000 mg | ORAL_TABLET | Freq: Every day | ORAL | 0 refills | Status: DC

## 2020-01-31 ENCOUNTER — Ambulatory Visit: Payer: Medicare (Managed Care)

## 2020-01-31 NOTE — Progress Notes (Deleted)
Patient ID: Kimberly Gross                 DOB: 03-25-1951                      MRN: [id number]     HPI: Kimberly Gross is a 69 y.o. female referred by Dr. Margaretann Loveless to HTN clinic. PMH includes hypertension, hyperlipidemia, DM, CKD-IV, and bipolar disorder.    Current HTN meds:  Amlodipine '10mg'$  daily  Carvedilol 12.'5mg'$  twice daily Furosemide '40mg'$  daily Hydralazine '50mg'$  three times daily  BP goal: 130/80  Family History: The patient's family history includes Heart attack in her father; Hypertension in her mother. There is no history of Breast cancer.  Social History: denies alcohol and tobacco use, drinks 2 caffeinated beverages per day  Diet:   Exercise:   Home BP readings:   Wt Readings from Last 3 Encounters:  12/22/19 283 lb (128.4 kg)  12/05/19 278 lb (126.1 kg)  09/29/19 276 lb (125.2 kg)   BP Readings from Last 3 Encounters:  12/22/19 (!) 150/96  12/05/19 (!) 156/88  09/29/19 128/72   Pulse Readings from Last 3 Encounters:  12/22/19 85  12/05/19 96  09/29/19 88    Renal function: CrCl cannot be calculated (Patient's most recent lab result is older than the maximum 21 days allowed.).  Past Medical History:  Diagnosis Date  . Anemia   . Anxiety   . Bipolar 1 disorder ([place])   . CKD (chronic kidney disease), stage IV ([place])   . Depression   . Diabetes mellitus without complication ([place])   . Hyperlipemia   . Hypertension   . Occasional tremors     Current Outpatient Medications on File Prior to Visit  Medication Sig Dispense Refill  . ACCU-CHEK AVIVA PLUS test strip TEST BLOOD SUGAR TWICE DAILY 200 strip 3  . Accu-Chek Softclix Lancets lancets USE AS DIRECTED  TO CHECK BLOOD GLUCOSE 200 each 3  . acetaminophen (TYLENOL) 500 MG tablet Take 1,000 mg by mouth 2 (two) times a day.    . Alcohol Swabs (B-D SINGLE USE SWABS REGULAR) PADS Use as directed to check blood sugars twice daily 100 each 5  . amLODipine (NORVASC) 10 MG tablet Take 1 tablet (10 mg total)  by mouth daily. 30 tablet 0  . aspirin 81 MG tablet Take 81 mg by mouth daily.     Marland Kitchen atorvastatin (LIPITOR) 40 MG tablet TAKE 1 TABLET BY MOUTH EVERYDAY AT BEDTIME 90 tablet 0  . Blood Glucose Monitoring Suppl (ACCU-CHEK AVIVA PLUS) w/Device KIT Check blood sugar twice a day 1 kit 0  . carvedilol (COREG) 12.5 MG tablet Take 1 tablet (12.5 mg total) by mouth 2 (two) times daily. 180 tablet 3  . clonazePAM (KLONOPIN) 0.5 MG tablet Take 1 tablet (0.5 mg total) by mouth 2 (two) times daily. 30 tablet 0  . divalproex (DEPAKOTE) 500 MG DR tablet 500 mg. 2 pills 2 times per day    . DM-APAP-CPM (CORICIDIN HBP PO) Take by mouth as needed.    . fenofibrate 160 MG tablet Take 1 tablet (160 mg total) by mouth daily. 90 tablet 0  . ferrous sulfate 325 (65 FE) MG tablet Take 325 mg by mouth daily with breakfast.    . furosemide (LASIX) 40 MG tablet TAKE 1 TABLET ON MONDAY, WEDNESDAY AND FRIDAY 39 tablet 3  . hydrALAZINE (APRESOLINE) 50 MG tablet Take 1 tablet (50 mg total) by mouth 3 (three)  times daily. 270 tablet 1  . Insulin Degludec-Liraglutide (XULTOPHY) 100-3.6 UNIT-MG/ML SOPN Inject 30 Units into the skin daily. 5 pen 6  . Insulin Pen Needle (DROPLET PEN NEEDLES) 32G X 4 MM MISC USE WITH XULTOPHY ONE TIME DAILY. 100 each 2  . loratadine (CLARITIN) 10 MG tablet Take 1 tablet (10 mg total) by mouth daily. (Patient taking differently: Take 10 mg by mouth as needed. ) 30 tablet 1  . Multiple Vitamins-Minerals (MULTIVITAMIN ADULT PO) Use as directed 1 tablet in the mouth or throat daily.    . Prednisolon-Gatiflox-Bromfenac 1-0.5-0.075 % SUSP Place 1 drop into the left eye 4 (four) times daily.    . traZODone (DESYREL) 50 MG tablet     . vitamin B-12 (CYANOCOBALAMIN) 250 MCG tablet Take 250 mcg by mouth daily.     No current facility-administered medications on file prior to visit.    Allergies  Allergen Reactions  . Chlorpromazine Rash    There were no vitals taken for this visit.  No  problem-specific Assessment & Plan notes found for this encounter.   Kaytee Taliercio Rodriguez-Guzman PharmD, BCPS, [place] [address] ,[place] [address] 01/31/2020 8:13 AM

## 2020-02-13 ENCOUNTER — Other Ambulatory Visit: Payer: Self-pay

## 2020-02-13 ENCOUNTER — Other Ambulatory Visit: Payer: Self-pay | Admitting: Nurse Practitioner

## 2020-02-13 MED ORDER — FUROSEMIDE 40 MG PO TABS: ORAL_TABLET | ORAL | 3 refills | Status: DC

## 2020-02-13 MED ORDER — FENOFIBRATE 160 MG PO TABS: 160.0000 mg | ORAL_TABLET | Freq: Every day | ORAL | 0 refills | Status: DC

## 2020-02-22 ENCOUNTER — Telehealth: Payer: Self-pay

## 2020-02-22 NOTE — Telephone Encounter (Signed)
  Chronic Care Management   Outreach Note  02/22/2020 Name: Kimberly Gross MRN: [id number] DOB: 1950-08-26  Referred by: [name], FNP Reason for referral : No chief complaint on file.   A second unsuccessful telephone outreach was attempted today. The patient was referred to the case management team for assistance with care management and care coordination.   Follow Up Plan: Telephone follow up appointment with care management team member scheduled for: 03/29/20  Barb Merino, RN, BSN, CCM Care Management Coordinator [facility]  Direct Phone: [phone]

## 2020-02-27 ENCOUNTER — Other Ambulatory Visit: Payer: Self-pay | Admitting: Nurse Practitioner

## 2020-02-27 ENCOUNTER — Other Ambulatory Visit: Payer: Self-pay

## 2020-02-27 DIAGNOSIS — R251 Tremor, unspecified: Secondary | ICD-10-CM

## 2020-02-27 MED ORDER — AMLODIPINE BESYLATE 10 MG PO TABS: 10.0000 mg | ORAL_TABLET | Freq: Every day | ORAL | 1 refills | Status: DC

## 2020-02-27 MED ORDER — CLONAZEPAM 0.5 MG PO TABS: 0.5000 mg | ORAL_TABLET | Freq: Two times a day (BID) | ORAL | 0 refills | Status: DC

## 2020-03-01 ENCOUNTER — Other Ambulatory Visit: Payer: Self-pay | Admitting: Nurse Practitioner

## 2020-03-01 DIAGNOSIS — Z1231 Encounter for screening mammogram for malignant neoplasm of breast: Secondary | ICD-10-CM

## 2020-03-06 ENCOUNTER — Telehealth: Payer: Self-pay

## 2020-03-06 ENCOUNTER — Encounter: Payer: Self-pay | Admitting: Nurse Practitioner

## 2020-03-06 ENCOUNTER — Other Ambulatory Visit: Payer: Self-pay

## 2020-03-06 ENCOUNTER — Ambulatory Visit (INDEPENDENT_AMBULATORY_CARE_PROVIDER_SITE_OTHER): Payer: Medicare (Managed Care) | Admitting: Nurse Practitioner

## 2020-03-06 VITALS — BP 138/72 | HR 83 | Temp 97.6°F | Ht 63.0 in | Wt 277.0 lb

## 2020-03-06 DIAGNOSIS — E782 Mixed hyperlipidemia: Secondary | ICD-10-CM

## 2020-03-06 DIAGNOSIS — F3132 Bipolar disorder, current episode depressed, moderate: Secondary | ICD-10-CM | POA: Diagnosis not present

## 2020-03-06 DIAGNOSIS — E119 Type 2 diabetes mellitus without complications: Secondary | ICD-10-CM

## 2020-03-06 DIAGNOSIS — I10 Essential (primary) hypertension: Secondary | ICD-10-CM

## 2020-03-06 DIAGNOSIS — Z23 Encounter for immunization: Secondary | ICD-10-CM

## 2020-03-06 DIAGNOSIS — E559 Vitamin D deficiency, unspecified: Secondary | ICD-10-CM

## 2020-03-06 MED ORDER — PNEUMOCOCCAL 13-VAL CONJ VACC IM SUSP: 0.5000 mL | INTRAMUSCULAR | 0 refills | Status: AC

## 2020-03-06 MED ORDER — XULTOPHY 100-3.6 UNIT-MG/ML ~~LOC~~ SOPN: 30.0000 [IU] | PEN_INJECTOR | Freq: Every day | SUBCUTANEOUS | 1 refills | Status: DC

## 2020-03-06 NOTE — Chronic Care Management (AMB) (Signed)
Chronic Care Management Pharmacy Assistant   Name: Kimberly Gross  MRN: [id number] DOB: [birth year]  Reason for Encounter: Medication Review / Patient Assistance Coordination  PCP : [name], FNP  Allergies:   Allergies  Allergen Reactions  . Chlorpromazine Rash    Medications: Outpatient Encounter Medications as of 03/06/2020  Medication Sig Note  . ACCU-CHEK AVIVA PLUS test strip TEST BLOOD SUGAR TWICE DAILY   . Accu-Chek Softclix Lancets lancets USE AS DIRECTED  TO CHECK BLOOD GLUCOSE   . acetaminophen (TYLENOL) 500 MG tablet Take 1,000 mg by mouth 2 (two) times a day.   . Alcohol Swabs (B-D SINGLE USE SWABS REGULAR) PADS Use as directed to check blood sugars twice daily   . amLODipine (NORVASC) 10 MG tablet Take 1 tablet (10 mg total) by mouth daily.   Marland Kitchen aspirin 81 MG tablet Take 81 mg by mouth daily.  05/08/2015: Received from: External Pharmacy Received Sig:   . atorvastatin (LIPITOR) 40 MG tablet TAKE 1 TABLET BY MOUTH EVERYDAY AT BEDTIME   . Blood Glucose Monitoring Suppl (ACCU-CHEK AVIVA PLUS) w/Device KIT Check blood sugar twice a day   . carvedilol (COREG) 12.5 MG tablet Take 1 tablet (12.5 mg total) by mouth 2 (two) times daily.   . clonazePAM (KLONOPIN) 0.5 MG tablet Take 1 tablet (0.5 mg total) by mouth 2 (two) times daily.   . divalproex (DEPAKOTE) 500 MG DR tablet 500 mg. 2 pills 2 times per day 05/08/2015: Received from: External Pharmacy  . DM-APAP-CPM (CORICIDIN HBP PO) Take by mouth as needed.   . fenofibrate 160 MG tablet Take 1 tablet (160 mg total) by mouth daily.   . ferrous sulfate 325 (65 FE) MG tablet Take 325 mg by mouth daily with breakfast.   . furosemide (LASIX) 40 MG tablet TAKE 1 TABLET ON MONDAY, WEDNESDAY AND FRIDAY   . hydrALAZINE (APRESOLINE) 50 MG tablet Take 1 tablet (50 mg total) by mouth 3 (three) times daily.   . Insulin Degludec-Liraglutide (XULTOPHY) 100-3.6 UNIT-MG/ML SOPN Inject 30 Units into the skin daily.   . Insulin Pen  Needle (DROPLET PEN NEEDLES) 32G X 4 MM MISC USE WITH XULTOPHY ONE TIME DAILY.   Marland Kitchen loratadine (CLARITIN) 10 MG tablet Take 1 tablet (10 mg total) by mouth daily. (Patient not taking: Reported on 03/06/2020)   . Multiple Vitamins-Minerals (MULTIVITAMIN ADULT PO) Use as directed 1 tablet in the mouth or throat daily. 05/08/2015: Received from: External Pharmacy Received Sig:   . pneumococcal 13-valent conjugate vaccine (PREVNAR 13) SUSP injection Inject 0.5 mLs into the muscle tomorrow at 10 am for 1 dose.   . Prednisolon-Gatiflox-Bromfenac 1-0.5-0.075 % SUSP Place 1 drop into both eyes 4 (four) times daily.    . traZODone (DESYREL) 50 MG tablet    . vitamin B-12 (CYANOCOBALAMIN) 250 MCG tablet Take 250 mcg by mouth daily.    No facility-administered encounter medications on file as of 03/06/2020.    Current Diagnosis: Patient Active Problem List   Diagnosis Date Noted  . Cough 09/10/2018  . Type 2 diabetes mellitus without complication, without long-term current use of insulin ([place]) 05/14/2018  . Essential hypertension 05/14/2018  . Upper respiratory infection, viral 05/14/2018  . Hyperlipidemia 05/05/2018  . Morbid (severe) obesity due to excess calories ([place]) 01/22/2017  . OSA (obstructive sleep apnea) 01/22/2017  . Tremor 05/08/2015  . Anxiety state 05/08/2015  . Medication-induced postural tremor 05/08/2015  . Bipolar 1 disorder, mixed, moderate ([place]) 05/08/2015    Follow-Up:  Patient Assistance Coordination- Patient is due for reorder of Xultophy 100-3.6 u/ml, filled out Novo Nordisk reorder form. Awaiting provider signature.  03/13/20- Faxed received 03/07/20 missing information, Kimberly Gross, CPP will refax on 03/12/20.  Kimberly Gross, CMA Clinical Pharmacist Assistant [phone]  

## 2020-03-06 NOTE — Patient Instructions (Signed)
Hypertension, Adult Hypertension is another name for high blood pressure. High blood pressure forces your heart to work harder to pump blood. This can cause problems over time. There are two numbers in a blood pressure reading. There is a top number (systolic) over a bottom number (diastolic). It is best to have a blood pressure that is below 120/80. Healthy choices can help lower your blood pressure, or you may need medicine to help lower it. What are the causes? The cause of this condition is not known. Some conditions may be related to high blood pressure. What increases the risk?  Smoking.  Having type 2 diabetes mellitus, high cholesterol, or both.  Not getting enough exercise or physical activity.  Being overweight.  Having too much fat, sugar, calories, or salt (sodium) in your diet.  Drinking too much alcohol.  Having long-term (chronic) kidney disease.  Having a family history of high blood pressure.  Age. Risk increases with age.  Race. You may be at higher risk if you are African American.  Gender. Men are at higher risk than women before age 45. After age 65, women are at higher risk than men.  Having obstructive sleep apnea.  Stress. What are the signs or symptoms?  High blood pressure may not cause symptoms. Very high blood pressure (hypertensive crisis) may cause: ? Headache. ? Feelings of worry or nervousness (anxiety). ? Shortness of breath. ? Nosebleed. ? A feeling of being sick to your stomach (nausea). ? Throwing up (vomiting). ? Changes in how you see. ? Very bad chest pain. ? Seizures. How is this treated?  This condition is treated by making healthy lifestyle changes, such as: ? Eating healthy foods. ? Exercising more. ? Drinking less alcohol.  Your health care provider may prescribe medicine if lifestyle changes are not enough to get your blood pressure under control, and if: ? Your top number is above 130. ? Your bottom number is above  80.  Your personal target blood pressure may vary. Follow these instructions at home: Eating and drinking   If told, follow the DASH eating plan. To follow this plan: ? Fill one half of your plate at each meal with fruits and vegetables. ? Fill one fourth of your plate at each meal with whole grains. Whole grains include whole-wheat pasta, Porche rice, and whole-grain bread. ? Eat or drink low-fat dairy products, such as skim milk or low-fat yogurt. ? Fill one fourth of your plate at each meal with low-fat (lean) proteins. Low-fat proteins include fish, chicken without skin, eggs, beans, and tofu. ? Avoid fatty meat, cured and processed meat, or chicken with skin. ? Avoid pre-made or processed food.  Eat less than 1,500 mg of salt each day.  Do not drink alcohol if: ? Your doctor tells you not to drink. ? You are pregnant, may be pregnant, or are planning to become pregnant.  If you drink alcohol: ? Limit how much you use to:  0-1 drink a day for women.  0-2 drinks a day for men. ? Be aware of how much alcohol is in your drink. In the [place] one drink equals one 12 oz bottle of beer (355 mL), one 5 oz glass of wine (148 mL), or one 1 oz glass of hard liquor (44 mL). Lifestyle   Work with your doctor to stay at a healthy weight or to lose weight. Ask your doctor what the best weight is for you.  Get at least 30 minutes of exercise most   days of the week. This may include walking, swimming, or biking.  Get at least 30 minutes of exercise that strengthens your muscles (resistance exercise) at least 3 days a week. This may include lifting weights or doing Pilates.  Do not use any products that contain nicotine or tobacco, such as cigarettes, e-cigarettes, and chewing tobacco. If you need help quitting, ask your doctor.  Check your blood pressure at home as told by your doctor.  Keep all follow-up visits as told by your doctor. This is important. Medicines  Take over-the-counter  and prescription medicines only as told by your doctor. Follow directions carefully.  Do not skip doses of blood pressure medicine. The medicine does not work as well if you skip doses. Skipping doses also puts you at risk for problems.  Ask your doctor about side effects or reactions to medicines that you should watch for. Contact a doctor if you:  Think you are having a reaction to the medicine you are taking.  Have headaches that keep coming back (recurring).  Feel dizzy.  Have swelling in your ankles.  Have trouble with your vision. Get help right away if you:  Get a very bad headache.  Start to feel mixed up (confused).  Feel weak or numb.  Feel faint.  Have very bad pain in your: ? Chest. ? Belly (abdomen).  Throw up more than once.  Have trouble breathing. Summary  Hypertension is another name for high blood pressure.  High blood pressure forces your heart to work harder to pump blood.  For most people, a normal blood pressure is less than 120/80.  Making healthy choices can help lower blood pressure. If your blood pressure does not get lower with healthy choices, you may need to take medicine. This information is not intended to replace advice given to you by your health care provider. Make sure you discuss any questions you have with your health care provider. Document Revised: 03/10/2018 Document Reviewed: 03/10/2018 Elsevier Patient Education  2020 Elsevier Inc. Diabetes Mellitus and Exercise Exercising regularly is important for your overall health, especially when you have diabetes (diabetes mellitus). Exercising is not only about losing weight. It has many other health benefits, such as increasing muscle strength and bone density and reducing body fat and stress. This leads to improved fitness, flexibility, and endurance, all of which result in better overall health. Exercise has additional benefits for people with diabetes, including:  Reducing  appetite.  Helping to lower and control blood glucose.  Lowering blood pressure.  Helping to control amounts of fatty substances (lipids) in the blood, such as cholesterol and triglycerides.  Helping the body to respond better to insulin (improving insulin sensitivity).  Reducing how much insulin the body needs.  Decreasing the risk for heart disease by: ? Lowering cholesterol and triglyceride levels. ? Increasing the levels of good cholesterol. ? Lowering blood glucose levels. What is my activity plan? Your health care provider or certified diabetes educator can help you make a plan for the type and frequency of exercise (activity plan) that works for you. Make sure that you:  Do at least 150 minutes of moderate-intensity or vigorous-intensity exercise each week. This could be brisk walking, biking, or water aerobics. ? Do stretching and strength exercises, such as yoga or weightlifting, at least 2 times a week. ? Spread out your activity over at least 3 days of the week.  Get some form of physical activity every day. ? Do not go more than 2 days   in a row without some kind of physical activity. ? Avoid being inactive for more than 30 minutes at a time. Take frequent breaks to walk or stretch.  Choose a type of exercise or activity that you enjoy, and set realistic goals.  Start slowly, and gradually increase the intensity of your exercise over time. What do I need to know about managing my diabetes?   Check your blood glucose before and after exercising. ? If your blood glucose is 240 mg/dL (13.3 mmol/L) or higher before you exercise, check your urine for ketones. If you have ketones in your urine, do not exercise until your blood glucose returns to normal. ? If your blood glucose is 100 mg/dL (5.6 mmol/L) or lower, eat a snack containing 15-20 grams of carbohydrate. Check your blood glucose 15 minutes after the snack to make sure that your level is above 100 mg/dL (5.6 mmol/L)  before you start your exercise.  Know the symptoms of low blood glucose (hypoglycemia) and how to treat it. Your risk for hypoglycemia increases during and after exercise. Common symptoms of hypoglycemia can include: ? Hunger. ? Anxiety. ? Sweating and feeling clammy. ? Confusion. ? Dizziness or feeling light-headed. ? Increased heart rate or palpitations. ? Blurry vision. ? Tingling or numbness around the mouth, lips, or tongue. ? Tremors or shakes. ? Irritability.  Keep a rapid-acting carbohydrate snack available before, during, and after exercise to help prevent or treat hypoglycemia.  Avoid injecting insulin into areas of the body that are going to be exercised. For example, avoid injecting insulin into: ? The arms, when playing tennis. ? The legs, when jogging.  Keep records of your exercise habits. Doing this can help you and your health care provider adjust your diabetes management plan as needed. Write down: ? Food that you eat before and after you exercise. ? Blood glucose levels before and after you exercise. ? The type and amount of exercise you have done. ? When your insulin is expected to peak, if you use insulin. Avoid exercising at times when your insulin is peaking.  When you start a new exercise or activity, work with your health care provider to make sure the activity is safe for you, and to adjust your insulin, medicines, or food intake as needed.  Drink plenty of water while you exercise to prevent dehydration or heat stroke. Drink enough fluid to keep your urine clear or pale yellow. Summary  Exercising regularly is important for your overall health, especially when you have diabetes (diabetes mellitus).  Exercising has many health benefits, such as increasing muscle strength and bone density and reducing body fat and stress.  Your health care provider or certified diabetes educator can help you make a plan for the type and frequency of exercise (activity plan)  that works for you.  When you start a new exercise or activity, work with your health care provider to make sure the activity is safe for you, and to adjust your insulin, medicines, or food intake as needed. This information is not intended to replace advice given to you by your health care provider. Make sure you discuss any questions you have with your health care provider. Document Revised: 01/22/2017 Document Reviewed: 12/10/2015 Elsevier Patient Education  2020 Elsevier Inc.  

## 2020-03-06 NOTE — Progress Notes (Signed)
I,Yamilka Roman [employer] as a Education administrator for [employer], FNP.,have documented all relevant documentation on the behalf of [name], FNP,as directed by  [name], FNP while in the presence of [name], [place]. This visit occurred during the SARS-CoV-2 public health emergency.  Safety protocols were in place, including screening questions prior to the visit, additional usage of staff PPE, and extensive cleaning of exam room while observing appropriate contact time as indicated for disinfecting solutions.  Subjective:     Patient ID: Kimberly Gross , female    DOB: [birth year] , 69 y.o.   MRN: [id number]   Chief Complaint  Patient presents with  . Diabetes  . Hypertension    HPI  Wt Readings from Last 3 Encounters: 03/06/20 : 277 lb (125.6 kg) 12/22/19 : 283 lb (128.4 kg) 12/05/19 : 278 lb (126.1 kg)  She is taking 30 units of xultophy daily. She is on the patient assistance but has not received any recently.  She continues to go to [facility]   Diabetes She presents for her follow-up diabetic visit. She has type 2 diabetes mellitus. No MedicAlert identification noted. Her disease course has been stable. Pertinent negatives for hypoglycemia include no dizziness or headaches. Pertinent negatives for diabetes include no blurred vision, no chest pain, no fatigue, no polydipsia, no polyphagia and no polyuria. There are no hypoglycemic complications. Symptoms are stable. There are no diabetic complications. Pertinent negatives for diabetic complications include no CVA. Risk factors for coronary artery disease include obesity and sedentary lifestyle. Current diabetic treatment includes insulin injections and oral agent (dual therapy) (Xultophy 30 units daily.  ). She is compliant with treatment all of the time. Her weight is stable. She is following a generally healthy diet. When asked about meal planning, she reported none. She has had a previous visit with a  dietitian. She participates in exercise three times a week. Her home blood glucose trend is decreasing steadily. Her overall blood glucose range is 140-180 mg/dl. (Blood sugars from 130-169) An ACE inhibitor/angiotensin II receptor blocker is being taken. She does not see a podiatrist.Eye exam is current.  Hypertension This is a chronic problem. The current episode started more than 1 year ago. The problem is unchanged. The problem is controlled. Pertinent negatives include no blurred vision, chest pain, headaches, palpitations or shortness of breath. There are no associated agents to hypertension. Risk factors for coronary artery disease include sedentary lifestyle, obesity, dyslipidemia and diabetes mellitus. Past treatments include ACE inhibitors. The current treatment provides no improvement. There are no compliance problems.  There is no history of angina or CVA. There is no history of chronic renal disease.     Past Medical History:  Diagnosis Date  . Anemia   . Anxiety   . Bipolar 1 disorder ([place])   . CKD (chronic kidney disease), stage IV ([place])   . Depression   . Diabetes mellitus without complication ([place])   . Hyperlipemia   . Hypertension   . Occasional tremors      Family History  Problem Relation Age of Onset  . Hypertension Mother   . Heart attack Father   . Breast cancer Neg Hx      Current Outpatient Medications:  .  ACCU-CHEK AVIVA PLUS test strip, TEST BLOOD SUGAR TWICE DAILY, Disp: 200 strip, Rfl: 3 .  Accu-Chek Softclix Lancets lancets, USE AS DIRECTED  TO CHECK BLOOD GLUCOSE, Disp: 200 each, Rfl: 3 .  acetaminophen (TYLENOL) 500 MG tablet, Take 1,000  mg by mouth 2 (two) times a day., Disp: , Rfl:  .  Alcohol Swabs (B-D SINGLE USE SWABS REGULAR) PADS, Use as directed to check blood sugars twice daily, Disp: 100 each, Rfl: 5 .  amLODipine (NORVASC) 10 MG tablet, Take 1 tablet (10 mg total) by mouth daily., Disp: 90 tablet, Rfl: 1 .  aspirin 81 MG tablet, Take 81 mg  by mouth daily. , Disp: , Rfl:  .  atorvastatin (LIPITOR) 40 MG tablet, TAKE 1 TABLET BY MOUTH EVERYDAY AT BEDTIME, Disp: 90 tablet, Rfl: 0 .  Blood Glucose Monitoring Suppl (ACCU-CHEK AVIVA PLUS) w/Device KIT, Check blood sugar twice a day, Disp: 1 kit, Rfl: 0 .  carvedilol (COREG) 12.5 MG tablet, Take 1 tablet (12.5 mg total) by mouth 2 (two) times daily., Disp: 180 tablet, Rfl: 3 .  clonazePAM (KLONOPIN) 0.5 MG tablet, Take 1 tablet (0.5 mg total) by mouth 2 (two) times daily., Disp: 30 tablet, Rfl: 0 .  divalproex (DEPAKOTE) 500 MG DR tablet, 500 mg. 2 pills 2 times per day, Disp: , Rfl:  .  DM-APAP-CPM (CORICIDIN HBP PO), Take by mouth as needed., Disp: , Rfl:  .  ferrous sulfate 325 (65 FE) MG tablet, Take 325 mg by mouth daily with breakfast., Disp: , Rfl:  .  furosemide (LASIX) 40 MG tablet, TAKE 1 TABLET ON MONDAY, WEDNESDAY AND FRIDAY, Disp: 39 tablet, Rfl: 3 .  hydrALAZINE (APRESOLINE) 50 MG tablet, Take 1 tablet (50 mg total) by mouth 3 (three) times daily., Disp: 270 tablet, Rfl: 1 .  Insulin Degludec-Liraglutide (XULTOPHY) 100-3.6 UNIT-MG/ML SOPN, Inject 30 Units into the skin daily., Disp: 15 mL, Rfl: 1 .  Insulin Pen Needle (DROPLET PEN NEEDLES) 32G X 4 MM MISC, USE WITH XULTOPHY ONE TIME DAILY., Disp: 100 each, Rfl: 2 .  Multiple Vitamins-Minerals (MULTIVITAMIN ADULT PO), Use as directed 1 tablet in the mouth or throat daily., Disp: , Rfl:  .  traZODone (DESYREL) 50 MG tablet, , Disp: , Rfl:  .  vitamin B-12 (CYANOCOBALAMIN) 250 MCG tablet, Take 250 mcg by mouth daily., Disp: , Rfl:  .  fenofibrate 160 MG tablet, TAKE 1 TABLET BY MOUTH EVERY DAY, Disp: 90 tablet, Rfl: 1   Allergies  Allergen Reactions  . Chlorpromazine Rash     Review of Systems  Constitutional: Negative for fatigue.  Eyes: Negative for blurred vision.  Respiratory: Negative for shortness of breath.   Cardiovascular: Negative for chest pain, palpitations and leg swelling.  Endocrine: Negative for  polydipsia, polyphagia and polyuria.  Musculoskeletal: Positive for arthralgias.  Neurological: Negative for dizziness and headaches.  Psychiatric/Behavioral: Negative.      Today's Vitals   03/06/20 1021  BP: 138/72  Pulse: 83  Temp: 97.6 F (36.4 C)  TempSrc: Oral  Weight: 277 lb (125.6 kg)  Height: $Remove'5\' 3"'CqSeeyt$  (1.6 m)  PainSc: 0-No pain   Body mass index is 49.07 kg/m.   Objective:  Physical Exam Constitutional:      General: She is not in acute distress.    Appearance: Normal appearance. She is well-developed. She is obese.  Cardiovascular:     Rate and Rhythm: Normal rate and regular rhythm.     Pulses: Normal pulses.     Heart sounds: Normal heart sounds. No murmur heard.   Pulmonary:     Effort: Pulmonary effort is normal. No respiratory distress.     Breath sounds: Normal breath sounds. No wheezing.  Chest:     Chest wall: No tenderness.  Musculoskeletal:        General: Normal range of motion.  Skin:    General: Skin is warm and dry.     Capillary Refill: Capillary refill takes less than 2 seconds.  Neurological:     General: No focal deficit present.     Mental Status: She is alert and oriented to person, place, and time.     Cranial Nerves: No cranial nerve deficit.  Psychiatric:        Mood and Affect: Mood normal.        Behavior: Behavior normal.        Thought Content: Thought content normal.        Judgment: Judgment normal.         Assessment And Plan:     1. Type 2 diabetes mellitus without complication, without long-term current use of insulin (HCC)  Chronic, fair control  Continue with current medications, she is getting her Xultophy with patient assistance.   Encouraged to limit intake of sugary foods and drinks  Encouraged to increase physical activity to 150 minutes per week as tolerated - Insulin Degludec-Liraglutide (XULTOPHY) 100-3.6 UNIT-MG/ML SOPN; Inject 30 Units into the skin daily.  Dispense: 15 mL; Refill: 1 - CMP14+EGFR -  Hemoglobin A1c  2. Essential hypertension . B/P is fairly controlled.  . CMP ordered to check renal function.  . The importance of regular exercise and dietary modification was stressed to the patient. - CMP14+EGFR  3. Mixed hyperlipidemia  Chronic, controlled  Continue with current medications, tolerating statin well - Lipid panel - CMP14+EGFR  4. Bipolar affective disorder, currently depressed, moderate (HCC) Chronic, continue with follow up with Behavioral Health  5. Morbid (severe) obesity due to excess calories ([place])  Chronic, she has lost 6 lbs since her last office visit  Continue with healthy diet and regular exercise   6. Vitamin D deficiency  Will check vitamin D level and supplement as needed.     Also encouraged to spend 15 minutes in the sun daily.  - VITAMIN D 25 Hydroxy (Vit-D Deficiency, Fractures)  7. Encounter for immunization - pneumococcal 13-valent conjugate vaccine (PREVNAR 13) SUSP injection; Inject 0.5 mLs into the muscle tomorrow at 10 am for 1 dose.  Dispense: 0.5 mL; Refill: 0     Patient was given opportunity to ask questions. Patient verbalized understanding of the plan and was able to repeat key elements of the plan. All questions were answered to their satisfaction.   Teola Bradley, FNP, have reviewed all documentation for this visit. The documentation on 04/04/20 for the exam, diagnosis, procedures, and orders are all accurate and complete.  THE PATIENT IS ENCOURAGED TO PRACTICE SOCIAL DISTANCING DUE TO THE COVID-19 PANDEMIC.

## 2020-03-07 LAB — LIPID PANEL
Chol/HDL Ratio: 3.5 ratio (ref 0.0–4.4)
Cholesterol, Total: 191 mg/dL (ref 100–199)
HDL: 54 mg/dL (ref >39)
LDL Chol Calc (NIH): 98 mg/dL (ref 0–99)
Triglycerides: 228 mg/dL — ABNORMAL HIGH (ref 0–149)
VLDL Cholesterol Cal: 39 mg/dL (ref 5–40)

## 2020-03-07 LAB — VITAMIN D 25 HYDROXY (VIT D DEFICIENCY, FRACTURES): Vit D, 25-Hydroxy: 36.2 ng/mL (ref 30.0–100.0)

## 2020-03-07 LAB — CMP14+EGFR
ALT: 11 IU/L (ref 0–32)
AST: 15 IU/L (ref 0–40)
Albumin/Globulin Ratio: 2 (ref 1.2–2.2)
Albumin: 4.7 g/dL (ref 3.8–4.8)
Alkaline Phosphatase: 71 IU/L (ref 48–121)
BUN/Creatinine Ratio: 16 (ref 12–28)
BUN: 39 mg/dL — ABNORMAL HIGH (ref 8–27)
Bilirubin Total: 0.2 mg/dL (ref 0.0–1.2)
CO2: 28 mmol/L (ref 20–29)
Calcium: 10.1 mg/dL (ref 8.7–10.3)
Chloride: 101 mmol/L (ref 96–106)
Creatinine, Ser: 2.37 mg/dL — ABNORMAL HIGH (ref 0.57–1.00)
GFR calc Af Amer: 24 mL/min/{1.73_m2} — ABNORMAL LOW (ref >59)
GFR calc non Af Amer: 20 mL/min/{1.73_m2} — ABNORMAL LOW (ref >59)
Globulin, Total: 2.3 g/dL (ref 1.5–4.5)
Glucose: 175 mg/dL — ABNORMAL HIGH (ref 65–99)
Potassium: 5 mmol/L (ref 3.5–5.2)
Sodium: 145 mmol/L — ABNORMAL HIGH (ref 134–144)
Total Protein: 7 g/dL (ref 6.0–8.5)

## 2020-03-07 LAB — HEMOGLOBIN A1C
Est. average glucose Bld gHb Est-mCnc: 192 mg/dL
Hgb A1c MFr Bld: 8.3 % — ABNORMAL HIGH (ref 4.8–5.6)

## 2020-03-12 ENCOUNTER — Other Ambulatory Visit: Payer: Self-pay

## 2020-03-12 ENCOUNTER — Ambulatory Visit
Admission: RE | Admit: 2020-03-12 | Discharge: 2020-03-12 | Disposition: A | Discharge status: Home / Self Care | Payer: Medicare (Managed Care) | Source: Ambulatory Visit | Attending: Nurse Practitioner | Admitting: Nurse Practitioner

## 2020-03-12 ENCOUNTER — Ambulatory Visit (INDEPENDENT_AMBULATORY_CARE_PROVIDER_SITE_OTHER): Payer: Medicare (Managed Care) | Admitting: Pharmacist

## 2020-03-12 VITALS — BP 154/78 | HR 80 | Resp 16 | Ht 63.0 in | Wt 284.0 lb

## 2020-03-12 DIAGNOSIS — I10 Essential (primary) hypertension: Secondary | ICD-10-CM

## 2020-03-12 DIAGNOSIS — Z1231 Encounter for screening mammogram for malignant neoplasm of breast: Secondary | ICD-10-CM

## 2020-03-12 IMAGING — MG DIGITAL SCREENING BILAT W/ TOMO W/ CAD
6 of 10 series · 6 of 30 positions shown · non-contrast
Comparison: Previous exam(s).

ACR Breast Density Category a: The breast tissue is almost entirely
fatty.

CLINICAL DATA: Screening.

EXAM:
DIGITAL SCREENING BILATERAL MAMMOGRAM WITH TOMO AND CAD

[R CC synth-2D]
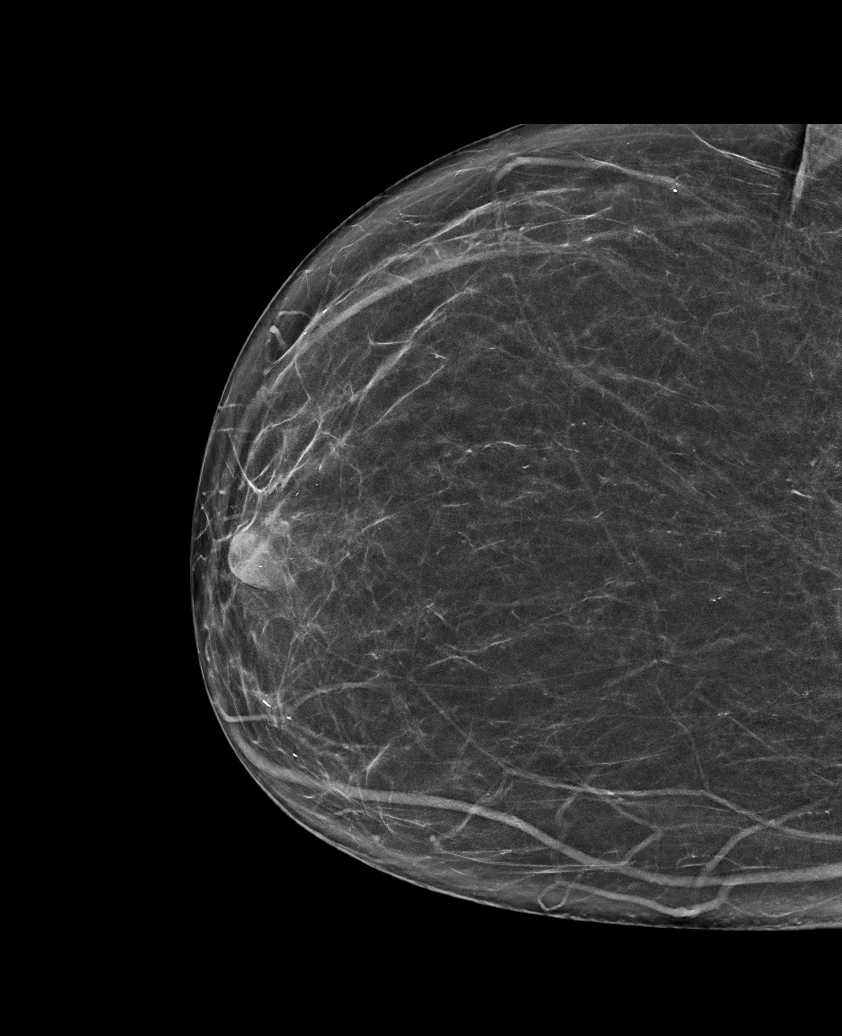

[L CC synth-2D]
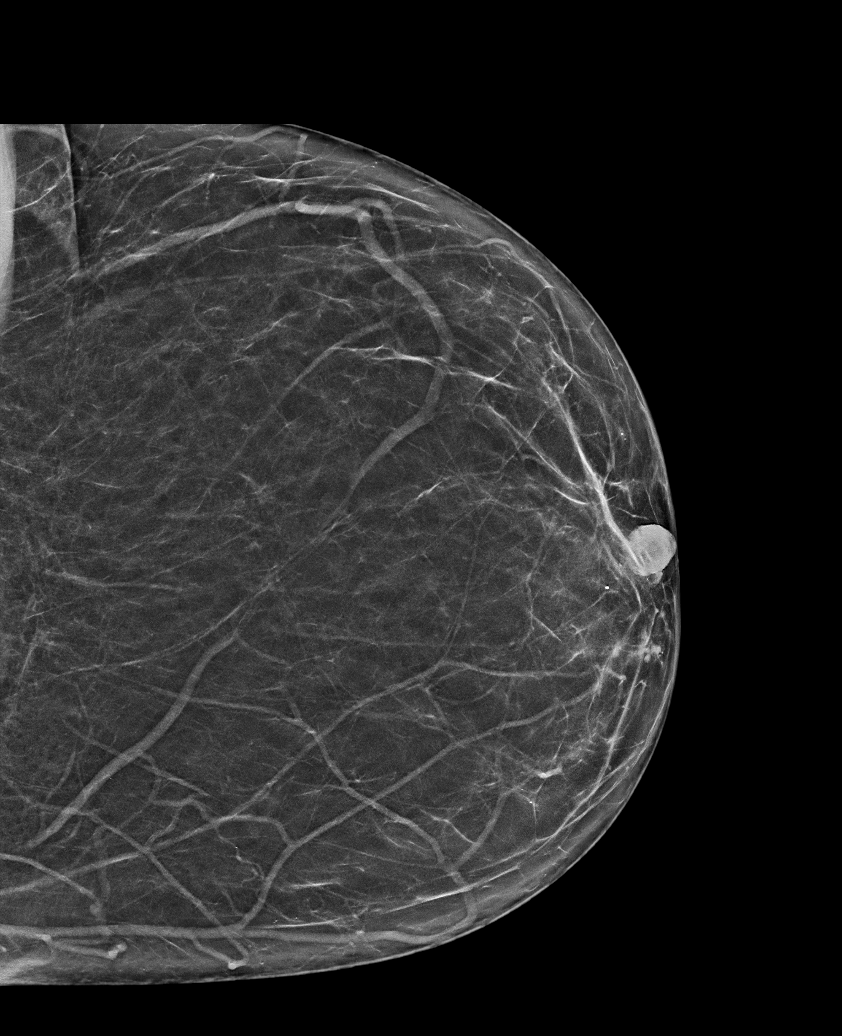

[R MLO synth-2D (1 of 2)]
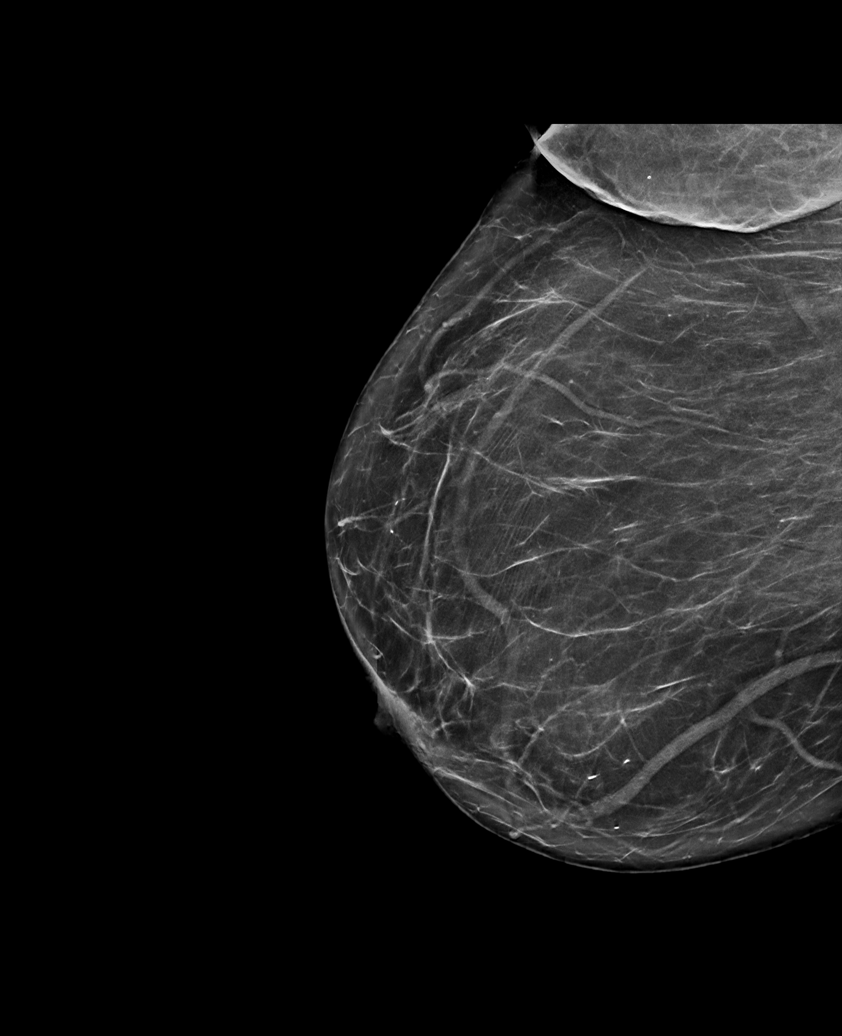

[R MLO synth-2D (2 of 2)]
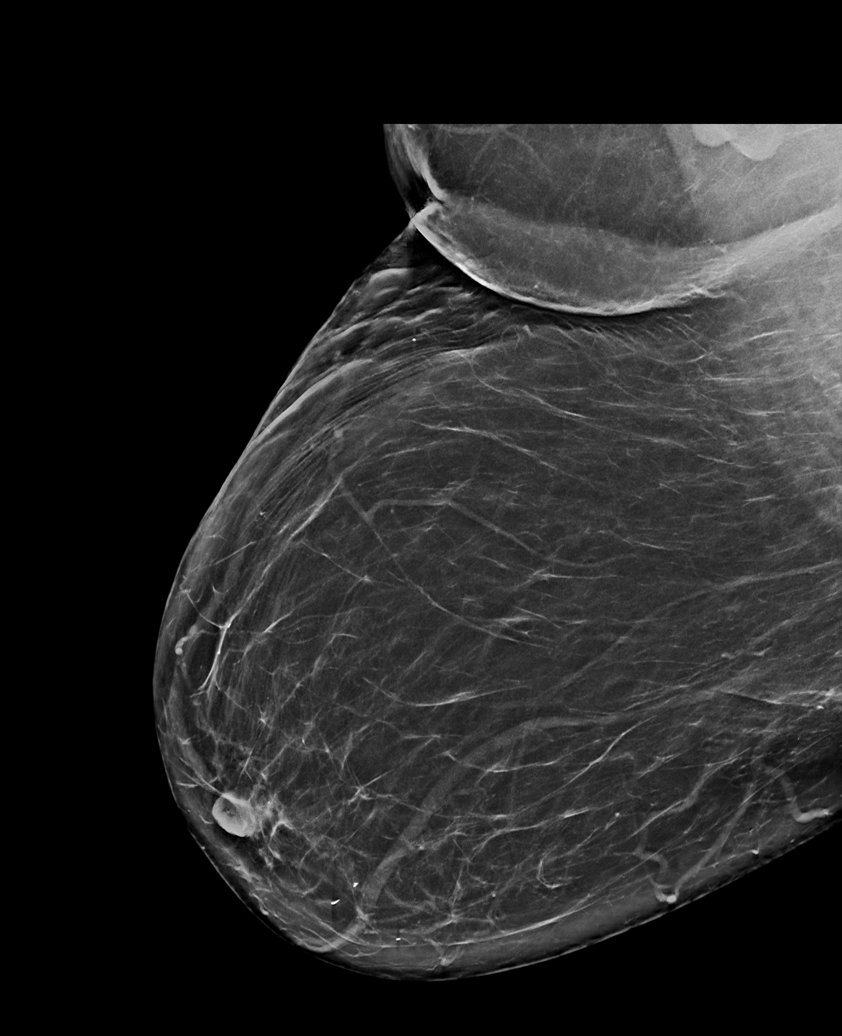

[L MLO synth-2D]
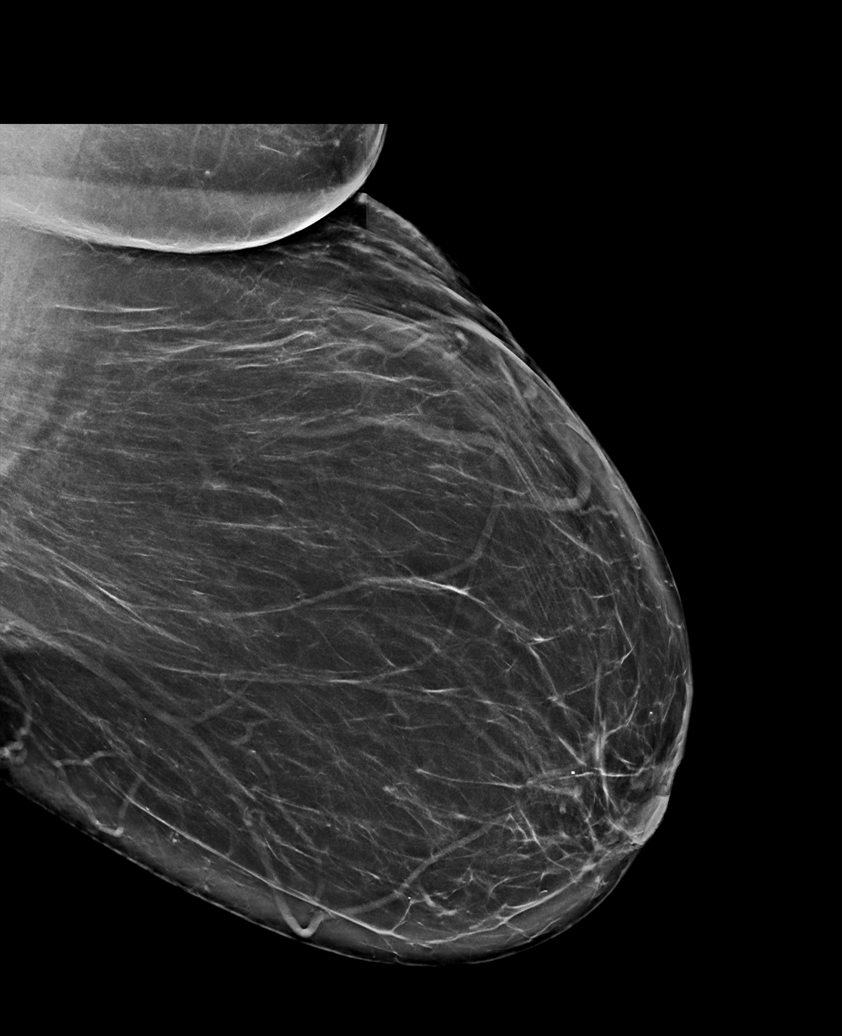

[R MLO tomo · tomo slice 44/87.0]
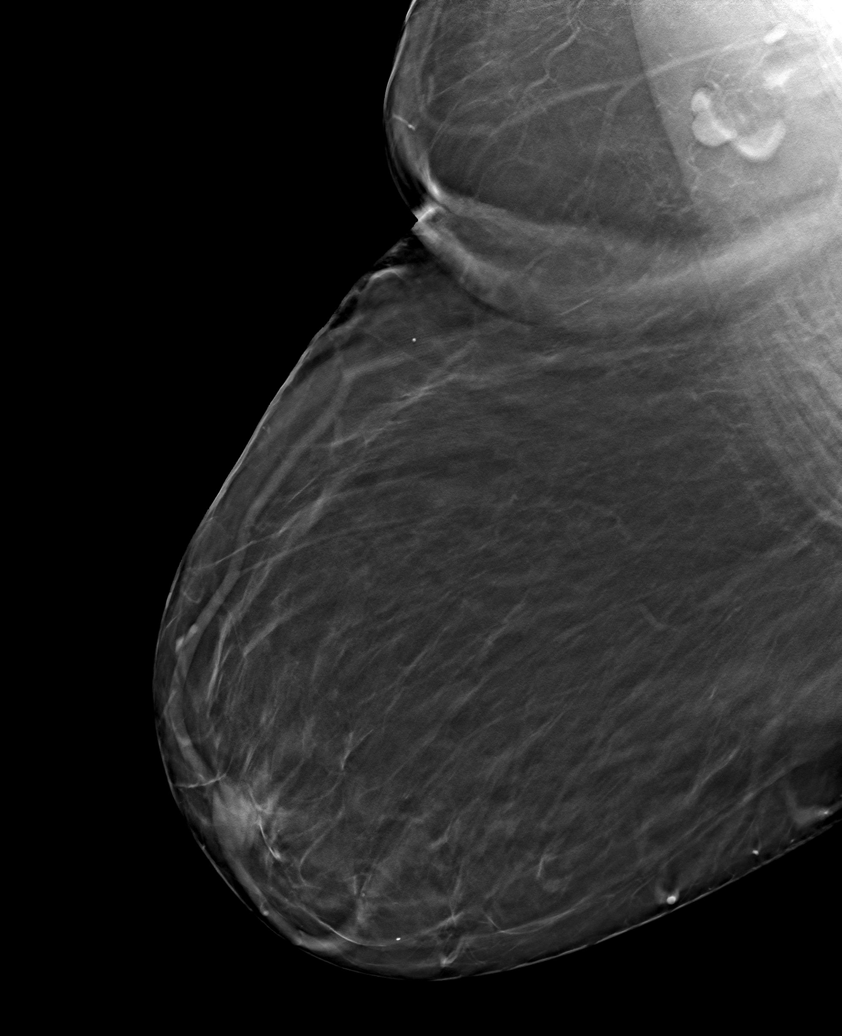

[6 of 30 positions shown; findings below may reference images not displayed]

FINDINGS: There are no findings suspicious for malignancy. Images were
processed with CAD.
IMPRESSION: No mammographic evidence of malignancy. A result letter of this
screening mammogram will be mailed directly to the patient.

RECOMMENDATION:
Screening mammogram in one year. (Code:[TA])

BI-RADS CATEGORY  1: Negative.

## 2020-03-12 NOTE — Patient Instructions (Signed)
Return for a  follow up appointment in 4 WEEKS  Check your blood pressure at home daily (if able) and keep record of the readings.  Take your BP meds as follows:  *NO MEDICATION CHANGE*  *START WORKING ON LOW SODIUM DIET AND INCREASE WALKING* *STAY HYDRATED*  Bring all of your meds, your BP cuff and your record of home blood pressures to your next appointment.  Exercise as you're able, try to walk approximately 30 minutes per day.  Keep salt intake to a minimum, especially watch canned and prepared boxed foods.  Eat more fresh fruits and vegetables and fewer canned items.  Avoid eating in fast food restaurants.    HOW TO TAKE YOUR BLOOD PRESSURE: . Rest 5 minutes before taking your blood pressure. .  Don't smoke or drink caffeinated beverages for at least 30 minutes before. . Take your blood pressure before (not after) you eat. . Sit comfortably with your back supported and both feet on the floor (don't cross your legs). . Elevate your arm to heart level on a table or a desk. . Use the proper sized cuff. It should fit smoothly and snugly around your bare upper arm. There should be enough room to slip a fingertip under the cuff. The bottom edge of the cuff should be 1 inch above the crease of the elbow. . Ideally, take 3 measurements at one sitting and record the average.

## 2020-03-12 NOTE — Progress Notes (Signed)
Patient ID: Kimberly Gross                 DOB: 1950/12/19                      MRN: [id number]     HPI: Kimberly Gross is a 69 y.o. female referred by Dr. Margaretann Loveless to HTN clinic. PMH includes hypertension, DM, hyperlipidemia,bipolar disorder and, CKD IV. Patient continues to have lots of social stressors and takes care of her grandkids. Her carvedilol dose was increased from 6.25mg  twice daily to 12.5mg  twice daily during last OV with DR Margaretann Loveless.  Current HTN meds:  Amlodipine 10mg  daily (8am) Carvedilol 12.5mg  twice daily (8am & 9pm) Furosemide 40mg  on MWF Hydralazine 50mg  three times daily (8am & noon & 9pm)  Intolerance: none  BP goal: 130/80 (140/90 if unable to tolerate)  Family History: The patient's family history includes Heart attack in her father; Hypertension in her mother. There is no history of Breast cancer.  Social History: denies tobacco use, drinks 1-2 caffeineated drink every day  Diet: 50/50 take out & home cooke meals (fired chicken, burgers, hot dogs)  Exercise: activies of daily living  Home BP readings: none provided  Wt Readings from Last 3 Encounters:  03/12/20 284 lb (128.8 kg)  03/06/20 277 lb (125.6 kg)  12/22/19 283 lb (128.4 kg)   BP Readings from Last 3 Encounters:  03/12/20 (!) 154/78  03/06/20 138/72  12/22/19 (!) 150/96   Pulse Readings from Last 3 Encounters:  03/12/20 80  03/06/20 83  12/22/19 85    Renal function: Estimated Creatinine Clearance: 29.8 mL/min (A) (by C-G formula based on SCr of 2.37 mg/dL (H)).  Past Medical History:  Diagnosis Date  . Anemia   . Anxiety   . Bipolar 1 disorder ([place])   . CKD (chronic kidney disease), stage IV ([place])   . Depression   . Diabetes mellitus without complication ([place])   . Hyperlipemia   . Hypertension   . Occasional tremors     Current Outpatient Medications on File Prior to Visit  Medication Sig Dispense Refill  . ACCU-CHEK AVIVA PLUS test strip TEST BLOOD SUGAR TWICE DAILY 200  strip 3  . Accu-Chek Softclix Lancets lancets USE AS DIRECTED  TO CHECK BLOOD GLUCOSE 200 each 3  . acetaminophen (TYLENOL) 500 MG tablet Take 1,000 mg by mouth 2 (two) times a day.    . Alcohol Swabs (B-D SINGLE USE SWABS REGULAR) PADS Use as directed to check blood sugars twice daily 100 each 5  . amLODipine (NORVASC) 10 MG tablet Take 1 tablet (10 mg total) by mouth daily. 90 tablet 1  . aspirin 81 MG tablet Take 81 mg by mouth daily.     Marland Kitchen atorvastatin (LIPITOR) 40 MG tablet TAKE 1 TABLET BY MOUTH EVERYDAY AT BEDTIME 90 tablet 0  . Blood Glucose Monitoring Suppl (ACCU-CHEK AVIVA PLUS) w/Device KIT Check blood sugar twice a day 1 kit 0  . carvedilol (COREG) 12.5 MG tablet Take 1 tablet (12.5 mg total) by mouth 2 (two) times daily. 180 tablet 3  . clonazePAM (KLONOPIN) 0.5 MG tablet Take 1 tablet (0.5 mg total) by mouth 2 (two) times daily. 30 tablet 0  . divalproex (DEPAKOTE) 500 MG DR tablet 500 mg. 2 pills 2 times per day    . DM-APAP-CPM (CORICIDIN HBP PO) Take by mouth as needed.    . fenofibrate 160 MG tablet Take 1 tablet (160 mg total)  by mouth daily. 90 tablet 0  . ferrous sulfate 325 (65 FE) MG tablet Take 325 mg by mouth daily with breakfast.    . furosemide (LASIX) 40 MG tablet TAKE 1 TABLET ON MONDAY, WEDNESDAY AND FRIDAY 39 tablet 3  . hydrALAZINE (APRESOLINE) 50 MG tablet Take 1 tablet (50 mg total) by mouth 3 (three) times daily. 270 tablet 1  . Insulin Degludec-Liraglutide (XULTOPHY) 100-3.6 UNIT-MG/ML SOPN Inject 30 Units into the skin daily. 15 mL 1  . Insulin Pen Needle (DROPLET PEN NEEDLES) 32G X 4 MM MISC USE WITH XULTOPHY ONE TIME DAILY. 100 each 2  . Multiple Vitamins-Minerals (MULTIVITAMIN ADULT PO) Use as directed 1 tablet in the mouth or throat daily.    . traZODone (DESYREL) 50 MG tablet     . vitamin B-12 (CYANOCOBALAMIN) 250 MCG tablet Take 250 mcg by mouth daily.     No current facility-administered medications on file prior to visit.    Allergies  Allergen  Reactions  . Chlorpromazine Rash    Blood pressure (!) 154/78, pulse 80, resp. rate 16, height 5' 3" (1.6 m), weight 284 lb (128.8 kg), SpO2 94 %.  Essential hypertension Blood pressure above goal during OV, but patient didn't took any of her mediation this morning including hydralazine and carvedilol. Noted she eats ~50% of her meal as take out, doesn't limit salt or sodium intake, and has a sedentary life.  Will continue all medication as prescribed, decrease sodium intake, start walking 10-15 per day, and increase hydration. Booklet with salty six was provided, and patient was inrtcuted to AVOID eating hot dogs.  Plan to follow up in 4 weeks and increase carvedilol to 4m BID if BP remains above goal and HR stable.    Edu On Rodriguez-Guzman PharmD, BCPS, [id number]/30/2021 4:08 PM

## 2020-03-12 NOTE — Assessment & Plan Note (Addendum)
Blood pressure above goal during OV, but patient didn't took any of her mediation this morning including hydralazine and carvedilol. Noted she eats ~50% of her meal as take out, doesn't limit salt or sodium intake, and has a sedentary life.  Will continue all medication as prescribed, decrease sodium intake, start walking 10-15 per day, and increase hydration. Booklet with salty six was provided, and patient was inrtcuted to AVOID eating hot dogs.  Plan to follow up in 4 weeks and increase carvedilol to 25mg  BID if BP remains above goal and HR stable.

## 2020-03-13 ENCOUNTER — Telehealth: Payer: Self-pay | Admitting: Licensed Clinical Social Worker

## 2020-03-13 NOTE — Telephone Encounter (Signed)
CSW received referral to assist patient with some insurance options for her daughter. CSW attempted to contact patient although unable to reach and message left for return call. Raquel Sarna, [place], [place]

## 2020-03-15 ENCOUNTER — Encounter: Payer: Self-pay | Admitting: Nurse Practitioner

## 2020-03-24 ENCOUNTER — Other Ambulatory Visit: Payer: Self-pay | Admitting: Nurse Practitioner

## 2020-03-26 ENCOUNTER — Telehealth: Payer: Self-pay

## 2020-03-26 NOTE — Chronic Care Management (AMB) (Signed)
° ° °  Chronic Care Management Pharmacy Assistant   Name: Kimberly Gross  MRN: [id number] DOB: 03-15-1951  Reason for Encounter: Medication Review / Patient Assistance Coordination     PCP : [name], FNP  Allergies:   Allergies  Allergen Reactions   Chlorpromazine Rash    Medications: Outpatient Encounter Medications as of 03/26/2020  Medication Sig Note   ACCU-CHEK AVIVA PLUS test strip TEST BLOOD SUGAR TWICE DAILY    Accu-Chek Softclix Lancets lancets USE AS DIRECTED  TO CHECK BLOOD GLUCOSE    acetaminophen (TYLENOL) 500 MG tablet Take 1,000 mg by mouth 2 (two) times a day.    Alcohol Swabs (B-D SINGLE USE SWABS REGULAR) PADS Use as directed to check blood sugars twice daily    amLODipine (NORVASC) 10 MG tablet Take 1 tablet (10 mg total) by mouth daily.    aspirin 81 MG tablet Take 81 mg by mouth daily.  05/08/2015: Received from: External Pharmacy Received Sig:    atorvastatin (LIPITOR) 40 MG tablet TAKE 1 TABLET BY MOUTH EVERYDAY AT BEDTIME    Blood Glucose Monitoring Suppl (ACCU-CHEK AVIVA PLUS) w/Device KIT Check blood sugar twice a day    carvedilol (COREG) 12.5 MG tablet Take 1 tablet (12.5 mg total) by mouth 2 (two) times daily.    clonazePAM (KLONOPIN) 0.5 MG tablet Take 1 tablet (0.5 mg total) by mouth 2 (two) times daily.    divalproex (DEPAKOTE) 500 MG DR tablet 500 mg. 2 pills 2 times per day 05/08/2015: Received from: External Pharmacy   DM-APAP-CPM (CORICIDIN HBP PO) Take by mouth as needed.    fenofibrate 160 MG tablet TAKE 1 TABLET BY MOUTH EVERY DAY    ferrous sulfate 325 (65 FE) MG tablet Take 325 mg by mouth daily with breakfast.    furosemide (LASIX) 40 MG tablet TAKE 1 TABLET ON MONDAY, WEDNESDAY AND FRIDAY    hydrALAZINE (APRESOLINE) 50 MG tablet Take 1 tablet (50 mg total) by mouth 3 (three) times daily.    Insulin Degludec-Liraglutide (XULTOPHY) 100-3.6 UNIT-MG/ML SOPN Inject 30 Units into the skin daily.    Insulin Pen Needle  (DROPLET PEN NEEDLES) 32G X 4 MM MISC USE WITH XULTOPHY ONE TIME DAILY.    Multiple Vitamins-Minerals (MULTIVITAMIN ADULT PO) Use as directed 1 tablet in the mouth or throat daily. 05/08/2015: Received from: External Pharmacy Received Sig:    traZODone (DESYREL) 50 MG tablet     vitamin B-12 (CYANOCOBALAMIN) 250 MCG tablet Take 250 mcg by mouth daily.    No facility-administered encounter medications on file as of 03/26/2020.    Current Diagnosis: Patient Active Problem List   Diagnosis Date Noted   Cough 09/10/2018   Type 2 diabetes mellitus without complication, without long-term current use of insulin ([place]) 05/14/2018   Essential hypertension 05/14/2018   Upper respiratory infection, viral 05/14/2018   Hyperlipidemia 05/05/2018   Morbid (severe) obesity due to excess calories ([place]) 01/22/2017   OSA (obstructive sleep apnea) 01/22/2017   Tremor 05/08/2015   Anxiety state 05/08/2015   Medication-induced postural tremor 05/08/2015   Bipolar 1 disorder, mixed, moderate ([place]) 05/08/2015      Follow-Up:  Patient Assistance Coordination /  [employer] regarding patient assistance for [place], spoke with [employer], informed that three boxes of Xulthophy and two boxes of needles was sent  on Sept 6, 2021. Will be at providers office on or before Sept 24, 2021.  Jannette Fogo, CPP aware .  Judithann Sheen, [facility] Clinical Pharmacist Assistant [phone]

## 2020-03-29 ENCOUNTER — Telehealth: Payer: Self-pay

## 2020-04-02 ENCOUNTER — Encounter: Payer: Self-pay | Admitting: Nurse Practitioner

## 2020-04-03 ENCOUNTER — Telehealth: Payer: Self-pay

## 2020-04-03 NOTE — Telephone Encounter (Signed)
I called patient and left her a v/m letting her know her letter for jury duty has been placed up front. YL,RMA

## 2020-04-10 ENCOUNTER — Other Ambulatory Visit: Payer: Self-pay

## 2020-04-10 ENCOUNTER — Ambulatory Visit (INDEPENDENT_AMBULATORY_CARE_PROVIDER_SITE_OTHER): Payer: Medicare (Managed Care) | Admitting: Pharmacist Clinician (PhC)/ Clinical Pharmacy Specialist

## 2020-04-10 DIAGNOSIS — I10 Essential (primary) hypertension: Secondary | ICD-10-CM | POA: Diagnosis not present

## 2020-04-10 MED ORDER — CARVEDILOL 25 MG PO TABS: 25.0000 mg | ORAL_TABLET | Freq: Two times a day (BID) | ORAL | 3 refills | Status: DC

## 2020-04-10 NOTE — Progress Notes (Signed)
Patient ID: Kimberly Gross                 DOB: 1951/05/01                      MRN: [id number]     HPI: Kimberly Gross is a 69 y.o. female referred by Dr. Margaretann Loveless to HTN clinic. PMH includes hypertension, DM, hyperlipidemia,bipolar disorder and, CKD IV. Patient continues to have lots of social stressors and takes care of her grandkids. Her carvedilol dose was increased from 6.25mg  twice daily to 12.5mg  twice daily during last OV with Dr. Margaretann Loveless.  She then saw Raquel Rodriguez-Guzman, who noted that patient admitted to a high sodium diet (~50% take out meals), sedentary lifestyle, and did not take her medications prior to visit. She was asked to work on lifestyle changes and increase compliance with her medications.    She returns today for follow up.  Her daughter is with her and notes that there has been a decrease in salt usage since last visit.  Now only eating about 1/2 as many hot dogs, and eating more at home.   Has switched from salt to Ms Deliah Boston for seasoning food as well.  Still not active, although she does keep house and lives with another daughter and 4 grandchildren.  At her last visit with Raquel Carrolyn Leigh her blood pressure was elevated at 154/78, however no medications were changed, in hopes of seeing some benefit with lifestyle modifications.    Current HTN meds:  Amlodipine 10mg  daily (8am) Carvedilol 12.5mg  twice daily (8am & 9pm) Furosemide 40mg  on MWF Hydralazine 50mg  three times daily (8am & noon & 9pm)  Intolerance: none  BP goal: 130/80 (140/90 if unable to tolerate)  Family History: The patient's family history includes Heart attack in her father; Hypertension in her mother.   Social History: denies tobacco use, drinks 1-2 caffeineated drink every day  Diet: still eating a mix of home and take out, but has done more home cooked recently.  Still eating some hot dogs, but not nearly as frequently, trying to get more vegetables; daughter still controlling her  access to ice cream.  Daughter trying to adjust both of their diets to accommodate her diabetes.    Exercise: activies of daily living; lives with daughter and 4 grandkids (teens)  Home BP readings: none provided  Wt Readings from Last 3 Encounters:  04/10/20 280 lb (127 kg)  03/12/20 284 lb (128.8 kg)  03/06/20 277 lb (125.6 kg)   BP Readings from Last 3 Encounters:  04/10/20 138/76  03/12/20 (!) 154/78  03/06/20 138/72   Pulse Readings from Last 3 Encounters:  04/10/20 95  03/12/20 80  03/06/20 83    Renal function: CrCl cannot be calculated (Patient's most recent lab result is older than the maximum 21 days allowed.).  Past Medical History:  Diagnosis Date  . Anemia   . Anxiety   . Bipolar 1 disorder ([place])   . CKD (chronic kidney disease), stage IV ([place])   . Depression   . Diabetes mellitus without complication ([place])   . Hyperlipemia   . Hypertension   . Occasional tremors     Current Outpatient Medications on File Prior to Visit  Medication Sig Dispense Refill  . ACCU-CHEK AVIVA PLUS test strip TEST BLOOD SUGAR TWICE DAILY 200 strip 3  . Accu-Chek Softclix Lancets lancets USE AS DIRECTED  TO CHECK BLOOD GLUCOSE 200 each 3  . acetaminophen (TYLENOL)  500 MG tablet Take 1,000 mg by mouth 2 (two) times a day.    . Alcohol Swabs (B-D SINGLE USE SWABS REGULAR) PADS Use as directed to check blood sugars twice daily 100 each 5  . amLODipine (NORVASC) 10 MG tablet Take 1 tablet (10 mg total) by mouth daily. 90 tablet 1  . aspirin 81 MG tablet Take 81 mg by mouth daily.     Marland Kitchen atorvastatin (LIPITOR) 40 MG tablet TAKE 1 TABLET BY MOUTH EVERYDAY AT BEDTIME 90 tablet 0  . Blood Glucose Monitoring Suppl (ACCU-CHEK AVIVA PLUS) w/Device KIT Check blood sugar twice a day 1 kit 0  . clonazePAM (KLONOPIN) 0.5 MG tablet Take 1 tablet (0.5 mg total) by mouth 2 (two) times daily. 30 tablet 0  . divalproex (DEPAKOTE) 500 MG DR tablet 500 mg. 2 pills 2 times per day    . DM-APAP-CPM  (CORICIDIN HBP PO) Take by mouth as needed.    . fenofibrate 160 MG tablet TAKE 1 TABLET BY MOUTH EVERY DAY 90 tablet 1  . ferrous sulfate 325 (65 FE) MG tablet Take 325 mg by mouth daily with breakfast.    . furosemide (LASIX) 40 MG tablet TAKE 1 TABLET ON MONDAY, WEDNESDAY AND FRIDAY 39 tablet 3  . hydrALAZINE (APRESOLINE) 50 MG tablet Take 1 tablet (50 mg total) by mouth 3 (three) times daily. 270 tablet 1  . Insulin Degludec-Liraglutide (XULTOPHY) 100-3.6 UNIT-MG/ML SOPN Inject 30 Units into the skin daily. 15 mL 1  . Insulin Pen Needle (DROPLET PEN NEEDLES) 32G X 4 MM MISC USE WITH XULTOPHY ONE TIME DAILY. 100 each 2  . Multiple Vitamins-Minerals (MULTIVITAMIN ADULT PO) Use as directed 1 tablet in the mouth or throat daily.    . traZODone (DESYREL) 50 MG tablet     . vitamin B-12 (CYANOCOBALAMIN) 250 MCG tablet Take 250 mcg by mouth daily.     No current facility-administered medications on file prior to visit.    Allergies  Allergen Reactions  . Chlorpromazine Rash    Blood pressure 138/76, pulse 95, resp. rate 16, height $RemoveBe'5\' 3"'[id number]$  (1.6 m), weight 280 lb (127 kg), SpO2 95 %.  Essential hypertension Patient with hypertension, doing much better with some lifestyle modifications.  Still not at goal, but had close to 20 point systolic drop since last visit.  Praised patient on these changes and encouraged her to continue cutting back on salt and to start with some exercise.  Suggested she start with walking 5 minutes away from house, then turn around and walk back.  She can increase this slowly over 2-3 weeks as she builds up some stamina.  Encouraged her to take her grandkids out for walks with her.    Will increase her carvedilol to 25 mg twice daily.  She will need to keep track of home BP readings and return in one month for follow up.    Tommy Medal PharmD CPP [facility] [address] ,[place] [address] 04/11/2020 10:36 AM

## 2020-04-10 NOTE — Patient Instructions (Signed)
Return for a a follow up appointment Nov 9 at 9 am  Your blood pressure today is 138/76  Check your blood pressure at home daily and keep record of the readings.  Take your BP meds as follows:  Increase carvedilol to 25 mg twice daily  Bring all of your meds, your BP cuff and your record of home blood pressures to your next appointment.  Exercise as you're able, try to walk approximately 30 minutes per day.  Keep salt intake to a minimum, especially watch canned and prepared boxed foods.  Eat more fresh fruits and vegetables and fewer canned items.  Avoid eating in fast food restaurants.    HOW TO TAKE YOUR BLOOD PRESSURE: . Rest 5 minutes before taking your blood pressure. .  Don't smoke or drink caffeinated beverages for at least 30 minutes before. . Take your blood pressure before (not after) you eat. . Sit comfortably with your back supported and both feet on the floor (don't cross your legs). . Elevate your arm to heart level on a table or a desk. . Use the proper sized cuff. It should fit smoothly and snugly around your bare upper arm. There should be enough room to slip a fingertip under the cuff. The bottom edge of the cuff should be 1 inch above the crease of the elbow. . Ideally, take 3 measurements at one sitting and record the average.

## 2020-04-11 ENCOUNTER — Encounter: Payer: Self-pay | Admitting: Pharmacist Clinician (PhC)/ Clinical Pharmacy Specialist

## 2020-04-11 NOTE — Assessment & Plan Note (Signed)
Patient with hypertension, doing much better with some lifestyle modifications.  Still not at goal, but had close to 20 point systolic drop since last visit.  Praised patient on these changes and encouraged her to continue cutting back on salt and to start with some exercise.  Suggested she start with walking 5 minutes away from house, then turn around and walk back.  She can increase this slowly over 2-3 weeks as she builds up some stamina.  Encouraged her to take her grandkids out for walks with her.    Will increase her carvedilol to 25 mg twice daily.  She will need to keep track of home BP readings and return in one month for follow up.

## 2020-04-16 ENCOUNTER — Telehealth: Payer: Self-pay | Admitting: Licensed Clinical Social Worker

## 2020-04-16 NOTE — Telephone Encounter (Signed)
CSW attempted again to reach out to patient with no success. Message left. Raquel Sarna, [place], [place]

## 2020-04-25 ENCOUNTER — Ambulatory Visit (INDEPENDENT_AMBULATORY_CARE_PROVIDER_SITE_OTHER): Payer: Medicare (Managed Care) | Admitting: Internal Medicine

## 2020-04-25 ENCOUNTER — Encounter: Payer: Self-pay | Admitting: Internal Medicine

## 2020-04-25 ENCOUNTER — Other Ambulatory Visit: Payer: Self-pay

## 2020-04-25 VITALS — BP 146/85 | HR 86 | Ht 63.0 in | Wt 282.0 lb

## 2020-04-25 DIAGNOSIS — I10 Essential (primary) hypertension: Secondary | ICD-10-CM | POA: Diagnosis not present

## 2020-04-25 DIAGNOSIS — E119 Type 2 diabetes mellitus without complications: Secondary | ICD-10-CM | POA: Diagnosis not present

## 2020-04-25 DIAGNOSIS — E785 Hyperlipidemia, unspecified: Secondary | ICD-10-CM

## 2020-04-25 DIAGNOSIS — N184 Chronic kidney disease, stage 4 (severe): Secondary | ICD-10-CM

## 2020-04-25 DIAGNOSIS — G4733 Obstructive sleep apnea (adult) (pediatric): Secondary | ICD-10-CM | POA: Diagnosis not present

## 2020-04-25 NOTE — Progress Notes (Signed)
Cardiology Office Note:    Date:  04/25/2020   ID:  Kimberly Gross, DOB 10-04-1950, MRN [id number]  PCP:  [name], FNP  Cardiologist:  Elouise Munroe, MD  Electrophysiologist:  None   Referring MD: [name], FNP   Chief Complaint/Reason for Referral: HTN  History of Present Illness:    Kimberly Gross is a 69 y.o. female with a history of  hypertension, diabetes, chronic kidney disease stage IV, bipolar 1 disorder. Follow up today for HTN.  Tolerating increased dose of carvedilol well. She had made great improvements in lifestyle. Daughter is with her today. Recently seen by Dr. Brion Aliment pharmacist in HTN clinic where carvedilol was increased. BP mildly above goal today but far better than it has been in previous visits. We participated in shared decision making and she would like to continue to pursue diet and lifestyle modification and see how she does on her current medication regimen. Denies chest pain, palpitations, lightheadedness or dizziness. Some exertional SOB as she begins to exercise more. Limited by orthopedic pains.  Past Medical History:  Diagnosis Date  . Anemia   . Anxiety   . Bipolar 1 disorder ([place])   . CKD (chronic kidney disease), stage IV ([place])   . Depression   . Diabetes mellitus without complication ([place])   . Hyperlipemia   . Hypertension   . Occasional tremors     Past Surgical History:  Procedure Laterality Date  . VAGINAL HYSTERECTOMY      Current Medications: Current Meds  Medication Sig  . ACCU-CHEK AVIVA PLUS test strip TEST BLOOD SUGAR TWICE DAILY  . Accu-Chek Softclix Lancets lancets USE AS DIRECTED  TO CHECK BLOOD GLUCOSE  . acetaminophen (TYLENOL) 500 MG tablet Take 1,000 mg by mouth 2 (two) times a day.  . Alcohol Swabs (B-D SINGLE USE SWABS REGULAR) PADS Use as directed to check blood sugars twice daily  . amLODipine (NORVASC) 10 MG tablet Take 1 tablet (10 mg total) by mouth daily.  Marland Kitchen aspirin 81 MG tablet Take 81 mg by  mouth daily.   Marland Kitchen atorvastatin (LIPITOR) 40 MG tablet TAKE 1 TABLET BY MOUTH EVERYDAY AT BEDTIME  . Blood Glucose Monitoring Suppl (ACCU-CHEK AVIVA PLUS) w/Device KIT Check blood sugar twice a day  . carvedilol (COREG) 25 MG tablet Take 1 tablet (25 mg total) by mouth 2 (two) times daily.  . clonazePAM (KLONOPIN) 0.5 MG tablet Take 1 tablet (0.5 mg total) by mouth 2 (two) times daily.  . divalproex (DEPAKOTE) 500 MG DR tablet 500 mg. 2 pills 2 times per day  . DM-APAP-CPM (CORICIDIN HBP PO) Take by mouth as needed.  . fenofibrate 160 MG tablet TAKE 1 TABLET BY MOUTH EVERY DAY  . ferrous sulfate 325 (65 FE) MG tablet Take 325 mg by mouth daily with breakfast.  . furosemide (LASIX) 40 MG tablet TAKE 1 TABLET ON MONDAY, WEDNESDAY AND FRIDAY  . hydrALAZINE (APRESOLINE) 50 MG tablet Take 1 tablet (50 mg total) by mouth 3 (three) times daily.  . Insulin Degludec-Liraglutide (XULTOPHY) 100-3.6 UNIT-MG/ML SOPN Inject 30 Units into the skin daily.  . Insulin Pen Needle (DROPLET PEN NEEDLES) 32G X 4 MM MISC USE WITH XULTOPHY ONE TIME DAILY.  . Multiple Vitamins-Minerals (MULTIVITAMIN ADULT PO) Use as directed 1 tablet in the mouth or throat daily.  . traZODone (DESYREL) 50 MG tablet   . vitamin B-12 (CYANOCOBALAMIN) 250 MCG tablet Take 250 mcg by mouth daily.     Allergies:   Chlorpromazine  Social History   Tobacco Use  . Smoking status: Never Smoker  . Smokeless tobacco: Never Used  Vaping Use  . Vaping Use: Never used  Substance Use Topics  . Alcohol use: No  . Drug use: No     Family History: The patient's family history includes Heart attack in her father; Hypertension in her mother. There is no history of Breast cancer.  ROS:   Please see the history of present illness.    All other systems reviewed and are negative.  EKGs/Labs/Other Studies Reviewed:    The following studies were reviewed today:   Recent Labs: 09/07/2019: Hemoglobin 13.2; Platelets 288 03/06/2020: ALT 11; BUN  39; Creatinine, Ser 2.37; Potassium 5.0; Sodium 145  Recent Lipid Panel    Component Value Date/Time   CHOL 191 03/06/2020 1212   TRIG 228 (H) 03/06/2020 1212   HDL 54 03/06/2020 1212   CHOLHDL 3.5 03/06/2020 1212   CHOLHDL 2.7 08/23/2010 2238   VLDL 12 08/23/2010 2238   LDLCALC 98 03/06/2020 1212    Physical Exam:    VS:  BP (!) 146/85   Pulse 86   Ht $R'5\' 3"'kN$  (1.6 m)   Wt 282 lb (127.9 kg)   SpO2 98%   BMI 49.95 kg/m     Wt Readings from Last 5 Encounters:  04/25/20 282 lb (127.9 kg)  04/10/20 280 lb (127 kg)  03/12/20 284 lb (128.8 kg)  03/06/20 277 lb (125.6 kg)  12/22/19 283 lb (128.4 kg)    Constitutional: No acute distress Cardiovascular: regular rhythm, normal rate, no murmurs. S1 and S2 normal. Radial pulses normal bilaterally. No jugular venous distention.  Respiratory: clear to auscultation bilaterally GI : normal bowel sounds, soft and nontender. No distention.   MSK: extremities warm, well perfused. No edema.  NEURO: grossly nonfocal exam, moves all extremities. PSYCH: alert and oriented x 3, normal mood and affect.   ASSESSMENT:    1. Essential hypertension   2. Hyperlipidemia, unspecified hyperlipidemia type   3. OSA (obstructive sleep apnea)   4. Type 2 diabetes mellitus without complication, without long-term current use of insulin ([place])   5. CKD (chronic kidney disease) stage 4, GFR 15-29 ml/min (HCC)    PLAN:    Continue regimen of amlodipine 10 mg daily, carvedilol 25 mg BID, furosemide MWF, hydralazine 50 mg TID. She is able to do the TID dosing without difficulty since she is at home and her children help her. We discussed dietary changes such as removing salt and processed food from diet. Strive for Mediterranean diet - or a primarily plant based diet with fresh whole food sources.   Last A1C 8.3%, and Trigs elevated. Trig control may improve with better diabetes control. Would recheck in 3-6 mo. Continue atorvastatin 40 mg daily and fenofibrate  160 mg. If no improvement in triglycerides, consider additional therapy.    Total time of encounter: 30 minutes total time of encounter, including 17 minutes spent in face-to-face patient care on the date of this encounter. This time includes coordination of care and counseling regarding above mentioned problem list. Remainder of non-face-to-face time involved reviewing chart documents/testing relevant to the patient encounter and documentation in the medical record. I have independently reviewed documentation from referring provider.   Cherlynn Kaiser, MD [place]  [facility]    Medication Adjustments/Labs and Tests Ordered: Current medicines are reviewed at length with the patient today.  Concerns regarding medicines are outlined above.   No orders of the defined types  were placed in this encounter.   No orders of the defined types were placed in this encounter.   Patient Instructions  Medication Instructions:  No Changes In Medications at this time.  *If you need a refill on your cardiac medications before your next appointment, please call your pharmacy*  Lab Work: None Ordered At This Time.  If you have labs (blood work) drawn today and your tests are completely normal, you will receive your results only by: Marland Kitchen MyChart Message (if you have MyChart) OR . A paper copy in the mail If you have any lab test that is abnormal or we need to change your treatment, we will call you to review the results.  Testing/Procedures: None Ordered At This Time.   Follow-Up: At [facility], you and your health needs are our priority.  As part of our continuing mission to provide you with exceptional heart care, we have created designated Provider Care Teams.  These Care Teams include your primary Cardiologist (physician) and Advanced Practice Providers (APPs -  Physician Assistants and Nurse Practitioners) who all work together to provide you with the care you need, when you need  it.  We recommend signing up for the patient portal called "MyChart".  Sign up information is provided on this After Visit Summary.  MyChart is used to connect with patients for Virtual Visits (Telemedicine).  Patients are able to view lab/test results, encounter notes, upcoming appointments, etc.  Non-urgent messages can be sent to your provider as well.   To learn more about what you can do with MyChart, go to [email or web address].    Your next appointment:   6 month(s)  The format for your next appointment:   In Person  Provider:   Cherlynn Kaiser, MD  Other Instructions  FOLLOW UP WITH [facility] IN 3 MONTHS FOR HYPERTENSION CLINIC

## 2020-04-25 NOTE — Patient Instructions (Signed)
Medication Instructions:  No Changes In Medications at this time.  *If you need a refill on your cardiac medications before your next appointment, please call your pharmacy*  Lab Work: None Ordered At This Time.  If you have labs (blood work) drawn today and your tests are completely normal, you will receive your results only by: Marland Kitchen MyChart Message (if you have MyChart) OR . A paper copy in the mail If you have any lab test that is abnormal or we need to change your treatment, we will call you to review the results.  Testing/Procedures: None Ordered At This Time.   Follow-Up: At [facility], you and your health needs are our priority.  As part of our continuing mission to provide you with exceptional heart care, we have created designated Provider Care Teams.  These Care Teams include your primary Cardiologist (physician) and Advanced Practice Providers (APPs -  Physician Assistants and Nurse Practitioners) who all work together to provide you with the care you need, when you need it.  We recommend signing up for the patient portal called "MyChart".  Sign up information is provided on this After Visit Summary.  MyChart is used to connect with patients for Virtual Visits (Telemedicine).  Patients are able to view lab/test results, encounter notes, upcoming appointments, etc.  Non-urgent messages can be sent to your provider as well.   To learn more about what you can do with MyChart, go to [email or web address].    Your next appointment:   6 month(s)  The format for your next appointment:   In Person  Provider:   Cherlynn Kaiser, MD  Other Instructions  FOLLOW UP WITH [facility] IN 3 MONTHS FOR HYPERTENSION CLINIC

## 2020-04-26 ENCOUNTER — Encounter (INDEPENDENT_AMBULATORY_CARE_PROVIDER_SITE_OTHER): Payer: Medicare (Managed Care) | Admitting: Ophthalmology

## 2020-04-26 DIAGNOSIS — E113291 Type 2 diabetes mellitus with mild nonproliferative diabetic retinopathy without macular edema, right eye: Secondary | ICD-10-CM

## 2020-04-26 DIAGNOSIS — H35372 Puckering of macula, left eye: Secondary | ICD-10-CM | POA: Diagnosis not present

## 2020-04-26 DIAGNOSIS — H43822 Vitreomacular adhesion, left eye: Secondary | ICD-10-CM

## 2020-04-26 DIAGNOSIS — H35033 Hypertensive retinopathy, bilateral: Secondary | ICD-10-CM

## 2020-04-26 DIAGNOSIS — I10 Essential (primary) hypertension: Secondary | ICD-10-CM | POA: Diagnosis not present

## 2020-04-26 DIAGNOSIS — H43813 Vitreous degeneration, bilateral: Secondary | ICD-10-CM

## 2020-04-26 DIAGNOSIS — E11319 Type 2 diabetes mellitus with unspecified diabetic retinopathy without macular edema: Secondary | ICD-10-CM

## 2020-05-04 ENCOUNTER — Telehealth: Payer: Self-pay

## 2020-05-04 ENCOUNTER — Telehealth: Payer: Medicare (Managed Care)

## 2020-05-04 NOTE — Telephone Encounter (Cosign Needed)
°  Chronic Care Management   Outreach Note  05/04/2020 Name: Kimberly Gross MRN: [id number] DOB: 02/13/1951  Referred by: [name], FNP Reason for referral : Chronic Care Management (CCM RNCM FU Call )   An unsuccessful telephone outreach was attempted today. The patient was referred to the case management team for assistance with care management and care coordination.   Follow Up Plan: A HIPAA compliant phone message was left for the patient providing contact information and requesting a return call.  Telephone follow up appointment with care management team member scheduled for: 06/14/20  Barb Merino, RN, BSN, CCM Care Management Coordinator [place] [facility]  Direct Phone: [phone]

## 2020-05-04 NOTE — Telephone Encounter (Signed)
  Chronic Care Management   Outreach Note  05/04/2020 Name: Kimberly Gross MRN: [id number] DOB: 05/20/1951  Referred by: [name], FNP Reason for referral : Care Coordination   An unsuccessful telephone outreach was attempted today. The patient was referred to the case management team for assistance with care management and care coordination.   Follow Up Plan: A HIPAA compliant phone message was left for the patient providing contact information and requesting a return call.  The care management team will reach out to the patient again over the next 21 days.   Daneen Schick, BSW, CDP Social Worker, Certified Dementia Practitioner [place] [facility] [place] [facility] [phone]

## 2020-05-15 ENCOUNTER — Telehealth: Payer: Self-pay | Admitting: Pharmacist

## 2020-05-15 NOTE — Chronic Care Management (AMB) (Signed)
° ° °  Chronic Care Management Pharmacy Assistant   Name: KAEDYN BELARDO  MRN: [id number] DOB: 1950/08/01  Reason for Encounter: Medication Review - Patient Assistance Coordination  PCP : [name], FNP  Allergies:   Allergies  Allergen Reactions   Chlorpromazine Rash    Medications: Outpatient Encounter Medications as of 05/15/2020  Medication Sig Note   ACCU-CHEK AVIVA PLUS test strip TEST BLOOD SUGAR TWICE DAILY    Accu-Chek Softclix Lancets lancets USE AS DIRECTED  TO CHECK BLOOD GLUCOSE    acetaminophen (TYLENOL) 500 MG tablet Take 1,000 mg by mouth 2 (two) times a day.    Alcohol Swabs (B-D SINGLE USE SWABS REGULAR) PADS Use as directed to check blood sugars twice daily    amLODipine (NORVASC) 10 MG tablet Take 1 tablet (10 mg total) by mouth daily.    aspirin 81 MG tablet Take 81 mg by mouth daily.  05/08/2015: Received from: External Pharmacy Received Sig:    atorvastatin (LIPITOR) 40 MG tablet TAKE 1 TABLET BY MOUTH EVERYDAY AT BEDTIME    Blood Glucose Monitoring Suppl (ACCU-CHEK AVIVA PLUS) w/Device KIT Check blood sugar twice a day    carvedilol (COREG) 25 MG tablet Take 1 tablet (25 mg total) by mouth 2 (two) times daily.    clonazePAM (KLONOPIN) 0.5 MG tablet Take 1 tablet (0.5 mg total) by mouth 2 (two) times daily.    divalproex (DEPAKOTE) 500 MG DR tablet 500 mg. 2 pills 2 times per day 05/08/2015: Received from: External Pharmacy   DM-APAP-CPM (CORICIDIN HBP PO) Take by mouth as needed.    fenofibrate 160 MG tablet TAKE 1 TABLET BY MOUTH EVERY DAY    ferrous sulfate 325 (65 FE) MG tablet Take 325 mg by mouth daily with breakfast.    furosemide (LASIX) 40 MG tablet TAKE 1 TABLET ON MONDAY, WEDNESDAY AND FRIDAY    hydrALAZINE (APRESOLINE) 50 MG tablet Take 1 tablet (50 mg total) by mouth 3 (three) times daily.    Insulin Degludec-Liraglutide (XULTOPHY) 100-3.6 UNIT-MG/ML SOPN Inject 30 Units into the skin daily.    Insulin Pen Needle (DROPLET PEN  NEEDLES) 32G X 4 MM MISC USE WITH XULTOPHY ONE TIME DAILY.    Multiple Vitamins-Minerals (MULTIVITAMIN ADULT PO) Use as directed 1 tablet in the mouth or throat daily. 05/08/2015: Received from: External Pharmacy Received Sig:    traZODone (DESYREL) 50 MG tablet     vitamin B-12 (CYANOCOBALAMIN) 250 MCG tablet Take 250 mcg by mouth daily.    No facility-administered encounter medications on file as of 05/15/2020.    Current Diagnosis: Patient Active Problem List   Diagnosis Date Noted   Cough 09/10/2018   Type 2 diabetes mellitus without complication, without long-term current use of insulin ([place]) 05/14/2018   Essential hypertension 05/14/2018   Upper respiratory infection, viral 05/14/2018   Hyperlipidemia 05/05/2018   Morbid (severe) obesity due to excess calories ([place]) 01/22/2017   OSA (obstructive sleep apnea) 01/22/2017   Tremor 05/08/2015   Anxiety state 05/08/2015   Medication-induced postural tremor 05/08/2015   Bipolar 1 disorder, mixed, moderate ([place]) 05/08/2015      Follow-Up:  Patient Assistance Coordination - New pap application filled  out for 2022 to [employer] for [employer]. Awaiting provider signature , and awaiting patients signature and proof of income to fax.  Beverly Milch, CPP. Notified  Judithann Sheen, [place] Pharmacist Assistant [phone]

## 2020-05-22 ENCOUNTER — Other Ambulatory Visit: Payer: Self-pay

## 2020-05-22 ENCOUNTER — Ambulatory Visit (INDEPENDENT_AMBULATORY_CARE_PROVIDER_SITE_OTHER): Payer: Medicare (Managed Care) | Admitting: Pharmacist

## 2020-05-22 VITALS — BP 148/82 | HR 77 | Resp 14 | Ht 63.0 in | Wt 278.2 lb

## 2020-05-22 DIAGNOSIS — I10 Essential (primary) hypertension: Secondary | ICD-10-CM | POA: Diagnosis not present

## 2020-05-22 MED ORDER — HYDRALAZINE HCL 100 MG PO TABS
100.0000 mg | ORAL_TABLET | Freq: Three times a day (TID) | ORAL | 1 refills | Status: DC
Start: 2020-05-22 — End: 2020-10-24

## 2020-05-22 NOTE — Progress Notes (Signed)
Patient ID: Kimberly Gross                 DOB: [birth year]                      MRN: [id number]     HPI: Kimberly Gross is a 69 y.o. female referred by Dr. Margaretann Gross to HTN clinic. PMH includes hypertension, DM, hyperlipidemia,bipolar disorder and, CKD IV. Patient continues to have lots of social stressors ut has the emotional support of her daughter. She encouraged Kimberly Gross to walk every day and help with cooking to decrease sodium intake as much as possible. Patient still like to eat hotdogs, but intake has decreased in the last few weeks.   Current HTN meds:  Amlodipine 38m daily (8am) Carvedilol 25 mg twice daily (8am & 9pm) Furosemide 440mon MWF Hydralazine 50106mhree times daily (8am & noon & 9pm)  Intolerance: none  BP goal: 130/80 (140/90 if unable to tolerate)  Family History: The patient's family history includes Heart attack in her father; Hypertension in her mother.   Social History: denies tobacco use, drinks 1-2 caffeineated drink every day  Diet: still eating a mix of home and take out, but has done more home cooked recently.  Still eating some hot dogs, but not nearly as frequently, trying to get more vegetables; daughter still controlling her access to ice cream.  Daughter trying to adjust both of their diets to accommodate her diabetes.    Exercise: activies of daily living; lives with daughter and 4 grandkids (teens).Take her   Home BP readings: none provided  Wt Readings from Last 3 Encounters:  05/22/20 278 lb 3.2 oz (126.2 kg)  04/25/20 282 lb (127.9 kg)  04/10/20 280 lb (127 kg)   BP Readings from Last 3 Encounters:  05/22/20 (!) 148/82  04/25/20 (!) 146/85  04/10/20 138/76   Pulse Readings from Last 3 Encounters:  05/22/20 77  04/25/20 86  04/10/20 95    Past Medical History:  Diagnosis Date  . Anemia   . Anxiety   . Bipolar 1 disorder ([place] . CKD (chronic kidney disease), stage IV ([place] . Depression   . Diabetes mellitus without  complication ([place] . Hyperlipemia   . Hypertension   . Occasional tremors     Current Outpatient Medications on File Prior to Visit  Medication Sig Dispense Refill  . ACCU-CHEK AVIVA PLUS test strip TEST BLOOD SUGAR TWICE DAILY 200 strip 3  . Accu-Chek Softclix Lancets lancets USE AS DIRECTED  TO CHECK BLOOD GLUCOSE 200 each 3  . acetaminophen (TYLENOL) 500 MG tablet Take 1,000 mg by mouth 2 (two) times a day.    . Alcohol Swabs (B-D SINGLE USE SWABS REGULAR) PADS Use as directed to check blood sugars twice daily 100 each 5  . amLODipine (NORVASC) 10 MG tablet Take 1 tablet (10 mg total) by mouth daily. 90 tablet 1  . aspirin 81 MG tablet Take 81 mg by mouth daily.     . aMarland Kitchenorvastatin (LIPITOR) 40 MG tablet TAKE 1 TABLET BY MOUTH EVERYDAY AT BEDTIME 90 tablet 0  . Blood Glucose Monitoring Suppl (ACCU-CHEK AVIVA PLUS) w/Device KIT Check blood sugar twice a day 1 kit 0  . clonazePAM (KLONOPIN) 0.5 MG tablet Take 1 tablet (0.5 mg total) by mouth 2 (two) times daily. 30 tablet 0  . divalproex (DEPAKOTE) 500 MG DR tablet 500 mg. 2 pills 2 times per day    .  DM-APAP-CPM (CORICIDIN HBP PO) Take by mouth as needed.    . fenofibrate 160 MG tablet TAKE 1 TABLET BY MOUTH EVERY DAY 90 tablet 1  . ferrous sulfate 325 (65 FE) MG tablet Take 325 mg by mouth daily with breakfast.    . furosemide (LASIX) 40 MG tablet TAKE 1 TABLET ON MONDAY, WEDNESDAY AND FRIDAY 39 tablet 3  . Insulin Degludec-Liraglutide (XULTOPHY) 100-3.6 UNIT-MG/ML SOPN Inject 30 Units into the skin daily. 15 mL 1  . Insulin Pen Needle (DROPLET PEN NEEDLES) 32G X 4 MM MISC USE WITH XULTOPHY ONE TIME DAILY. 100 each 2  . Multiple Vitamins-Minerals (MULTIVITAMIN ADULT PO) Use as directed 1 tablet in the mouth or throat daily.    . traZODone (DESYREL) 50 MG tablet     . vitamin B-12 (CYANOCOBALAMIN) 250 MCG tablet Take 250 mcg by mouth daily.    . carvedilol (COREG) 25 MG tablet Take 1 tablet (25 mg total) by mouth 2 (two) times daily.  180 tablet 3   No current facility-administered medications on file prior to visit.    Allergies  Allergen Reactions  . Chlorpromazine Rash    Blood pressure (!) 148/82, pulse 77, resp. rate 14, height 5' 3"  (1.6 m), weight 278 lb 3.2 oz (126.2 kg), SpO2 95 %.  Essential hypertension Blood pressure remains above goal and unchanged since las OV. Patient still struggles with positive lifestyle modifications, but her daughter is trying to help her with daily walks and decrease sodium in diet.  Will increase hydratazine to 173m TID, and follow up in 8 weeks.  Kimberly Gross PharmD, BCPS, CRinard[address] StreetGreensboro,[place] [id number]/17/2021 2:12 PM

## 2020-05-22 NOTE — Patient Instructions (Addendum)
Return for a follow up appointment in 8 weeks  Check your blood pressure at home daily (if able) and keep record of the readings.  Take your BP meds as follows: *INCREASE hydralazine to 100mg  three times daily*  Bring all of your meds, your BP cuff and your record of home blood pressures to your next appointment.  Exercise as you're able, try to walk approximately 30 minutes per day.  Keep salt intake to a minimum, especially watch canned and prepared boxed foods.  Eat more fresh fruits and vegetables and fewer canned items.  Avoid eating in fast food restaurants.    HOW TO TAKE YOUR BLOOD PRESSURE: . Rest 5 minutes before taking your blood pressure. .  Don't smoke or drink caffeinated beverages for at least 30 minutes before. . Take your blood pressure before (not after) you eat. . Sit comfortably with your back supported and both feet on the floor (don't cross your legs). . Elevate your arm to heart level on a table or a desk. . Use the proper sized cuff. It should fit smoothly and snugly around your bare upper arm. There should be enough room to slip a fingertip under the cuff. The bottom edge of the cuff should be 1 inch above the crease of the elbow. . Ideally, take 3 measurements at one sitting and record the average.

## 2020-05-25 ENCOUNTER — Telehealth: Payer: Self-pay

## 2020-05-25 ENCOUNTER — Telehealth: Payer: Medicare (Managed Care)

## 2020-05-25 NOTE — Telephone Encounter (Signed)
°  Chronic Care Management   Outreach Note  05/25/2020 Name: Kimberly Gross MRN: [id number] DOB: [birth year]  Referred by: [name], FNP Reason for referral : Care Coordination   A second unsuccessful telephone outreach was attempted today. The patient was referred to the case management team for assistance with care management and care coordination.   Follow Up Plan: A HIPAA compliant phone message was left for the patient providing contact information and requesting a return call.  The care management team will reach out to the patient again over the next 28 days.   Daneen Schick, BSW, CDP Social Worker, Certified Dementia Practitioner [place] [facility] [place] [facility] [phone]

## 2020-05-30 ENCOUNTER — Encounter: Payer: Self-pay | Admitting: Pharmacist

## 2020-05-30 NOTE — Assessment & Plan Note (Signed)
Blood pressure remains above goal and unchanged since las OV. Patient still struggles with positive lifestyle modifications, but her daughter is trying to help her with daily walks and decrease sodium in diet.  Will increase hydratazine to 100mg  TID, and follow up in 8 weeks.

## 2020-06-12 ENCOUNTER — Ambulatory Visit: Payer: Medicare (Managed Care) | Admitting: Nurse Practitioner

## 2020-06-13 ENCOUNTER — Ambulatory Visit: Payer: Medicare (Managed Care) | Admitting: Nurse Practitioner

## 2020-06-13 ENCOUNTER — Other Ambulatory Visit: Payer: Self-pay | Admitting: Internal Medicine

## 2020-06-14 ENCOUNTER — Telehealth: Payer: Medicare (Managed Care)

## 2020-06-14 ENCOUNTER — Other Ambulatory Visit: Payer: Self-pay

## 2020-06-14 ENCOUNTER — Ambulatory Visit: Payer: Self-pay

## 2020-06-14 DIAGNOSIS — I10 Essential (primary) hypertension: Secondary | ICD-10-CM

## 2020-06-14 DIAGNOSIS — E559 Vitamin D deficiency, unspecified: Secondary | ICD-10-CM

## 2020-06-14 DIAGNOSIS — N184 Chronic kidney disease, stage 4 (severe): Secondary | ICD-10-CM

## 2020-06-14 DIAGNOSIS — E119 Type 2 diabetes mellitus without complications: Secondary | ICD-10-CM

## 2020-06-14 DIAGNOSIS — E782 Mixed hyperlipidemia: Secondary | ICD-10-CM

## 2020-06-14 DIAGNOSIS — F3132 Bipolar disorder, current episode depressed, moderate: Secondary | ICD-10-CM

## 2020-06-14 DIAGNOSIS — R29898 Other symptoms and signs involving the musculoskeletal system: Secondary | ICD-10-CM

## 2020-06-15 ENCOUNTER — Other Ambulatory Visit: Payer: Self-pay | Admitting: Nurse Practitioner

## 2020-06-15 NOTE — Patient Instructions (Signed)
Visit Information  Goals Addressed      Patient Stated     COMPLETED: "I am having trouble affording my new diabetes medicine" (pt-stated)        Current Barriers:   Financial constraints   Reported payor restrictions on coverage  Clinical Social Work Clinical Goal(s):   Over the next 30 days, patient will work with CCM PharmD to address needs related to medication assistance (goal re-established 07/04/19)  CCM RN CM Interventions: 06/14/20 call completed with patient   Determined patient is receiving PAP for assistance for Xultophy and continues to be engaged with the embedded Pharm D   Patient Self Care Activities:   Self administers medications as prescribed  Calls pharmacy for medication refills  Calls provider office for new concerns or questions  Please see past updates related to this goal by clicking on the "Past Updates" button in the selected goal        "I check my blood sugar twice a day" (pt-stated)   Not on track     [place] (see longitudinal plan of care for additional care plan information)  Current Barriers:   Knowledge Deficits related to disease process and Self Health management of type 2 Diabetes   Chronic Disease Management support and education needs related to Type 2 diabetes mellitus, Essential hypertension, Mixed Hyperlipidemia, Decreased strength of lower extremity, stage 4 chronic kidney disease   Nurse Case Manager Clinical Goal(s):   Over the next 90 days, patient will work with the CCM team and PCP to address needs related to disease education and support for improved Self Health management of type 2 Diabetes   CCM RN CM Interventions:  06/14/20 call completed with patient   Inter-disciplinary care team collaboration (see longitudinal plan of care)  Evaluation of current treatment plan related to Diabetes and patient's adherence to plan as established by provider.  Provided education to patient re: current A1c is up to 8.3  from 6.5; Educated on daily glycemic control FBS 80-130, <180 after meals; Educated on 15'15' rule; Educated on dietary and exercise recommendations   Reviewed medications with patient and discussed patient is adhering to her prescribed DM treatment plan; Determined patient is receiving PAP assistance for Xultophy and continues to be engaged with the embedded Pharm D; Current regimen:  o  o Insulin Degludec-Liraglutide (XULTOPHY) 100-3.6 UNIT-MG/ML SOPN o Inject 30 Units into the skin daily.    Provided patient with printed educational materials related to Diabetes Meal Planning; Diabetes Zone Safety Tool; Grocery Shopping with Diabetes; Diabetes Care Schedule; Preventing Complications from Diabetes  Advised patient, providing education and rationale, to check cbg before meals 1-2 times daily and record, calling the CCM team and or PCP for findings outside established parameters  Discussed plans with patient for ongoing care management follow up and provided patient with direct contact information for care management team  Patient Self Care Activities:   Continue to Self administer medications as prescribed  Attend all scheduled provider appointments  Call pharmacy for medication refills  Call provider office for new concerns or questions  Follow dietary and exercise recommendations  Continue to monitor CBG 1-2 times daily before meals and record  Please see past updates related to this goal by clicking on the "Past Updates" button in the selected goal        "I have pain everyday from my arthritis" (pt-stated)   On track     [place] (see longitudinal plan of care for additional care  plan information)  Current Barriers:   Knowledge Deficits related to evaluation and treatment of chronic pain and decreased strength of lower extremity   Chronic Disease Management support and education needs related to Type 2 diabetes mellitus, Essential hypertension, Mixed Hyperlipidemia,  Decreased strength of lower extremity, stage 4 chronic kidney disease   Nurse Case Manager Clinical Goal(s):   Over the next 90 days, patient will work with the CCM team and PCP  to address needs related to evaluate and treat chronic pain and decreased strength to lower extremities   CCM RN CM Interventions:  06/14/20 call completed with patient   Inter-disciplinary care team collaboration (see longitudinal plan of care)  Evaluation of current treatment plan related to chronic pain and decreased strength to lower extremities and patient's adherence to plan as established by provider.  Provided education to patient re: signs and symptoms suggestive of arthritis; Educated on dietary and exercise recommendations to help decrease inflammation and promote muscle strength and endurance  Reviewed medications with patient and discussed patient is taking Acetaminophen 500 mg, 2 tablets twice daily for pain   Discussed patient would like to be further evaluated for Osteoarthritis and or Rheumatoid Arthritis, she is not currently established with a Rheumatologist  Determined patient has used PT in the past for treatment of muscle weakness with good effectiveness   Determined patient has a walker that she uses at all times with ambulation to help with balance and fall prevention, patient denies recent falls   Discussed next scheduled PCP follow up appointment scheduled for 09/12/20 @10 :30 PM with [name] FNP   Discussed plans with patient for ongoing care management follow up and provided patient with direct contact information for care management team  Patient Self Care Activities:   Continue to self administers medications as prescribed  Attend all scheduled provider appointments  Call pharmacy for medication refills  Call provider office for new concerns or questions  Continue to take Acetaminophen as directed for pain  Continue to use DME with ambulation and wear supportive  shoes  Please see past updates related to this goal by clicking on the "Past Updates" button in the selected goal        "to improve my kidney function" (pt-stated)   Not on track     [place] (see longitudinal plan of care for additional care plan information)  Current Barriers:   Knowledge Deficits related to disease process and Self Health management of Chronic Kidney Disease   Chronic Disease Management support and education needs related to Type 2 diabetes mellitus, Essential hypertension, Mixed Hyperlipidemia, Decreased strength of lower extremity, stage 4 chronic kidney disease   Nurse Case Manager Clinical Goal(s):   Over the next 90 days, patient will work with the CCM team and PCP to address needs related to disease education and support for improved Self Health management of CKD   CCM RN CM Interventions:  06/14/20 call completed with patient   Inter-disciplinary care team collaboration (see longitudinal plan of care)  Evaluation of current treatment plan related to stage 4 chronic kidney disease and patient's adherence to plan as established by provider.  Provided education to patient re: current GFR <24; Educated on Stages of chronic kidney disease; Educated on importance of staying well hydrated by drinking plenty of water; Educated on importance of keeping Hypertension and Diabetes under good control  Determined patient is established with [facility], Dr. Moshe Cipro, who is monitoring her renal function closely   Discussed plans with  patient for ongoing care management follow up and provided patient with direct contact information for care management team  Patient Self Care Activities:   Continue to self administer medications as prescribed  Attend all scheduled provider appointments  Call pharmacy for medication refills  Call provider office for new concerns or questions  Drink at least 64 oz of water daily unless otherwise directed by  MD  Initial goal documentation       "to keep my blood pressure under good control" (pt-stated)   On track     [place] (see longitudinal plan of care for additional care plan information)  Current Barriers:   Knowledge Deficits related to disease process and Self Health management of Hypertension   Chronic Disease Management support and education needs related to Type 2 diabetes mellitus, Essential hypertension, Mixed Hyperlipidemia, Decreased strength of lower extremity, stage 4 chronic kidney disease   Nurse Case Manager Clinical Goal(s):   Over the next 90 days, patient will work with the CCM team and PCP to address needs related to disease education and support for improved Self Health management of Hypertension   CCM RN CM Interventions:  06/14/20 call completed with patient   Inter-disciplinary care team collaboration (see longitudinal plan of care)  Evaluation of current treatment plan related to Hypertension and patient's adherence to plan as established by provider  Provided education to patient re: target BP <130/80; Educated on dietary and exercise recommendations  Determined patient is being followed by the [facility]; Determined she is being followed by Velora Heckler RPH-CPP to assist with medication monitoring and dosage adjustments as needed to improve Hypertension   Reviewed medications with patient and discussed patient is adhering to her prescribed regimen per Cardiology; Current regimen:  o Amlodipine 10mg  daily (8am) o Carvedilol 25 mg twice daily (8am & 9pm) o Furosemide 40mg  on MWF o Hydralazine 100mg  three times daily (8am & noon & 9pm)  Instructed patient to continue to monitor her BP at home 1-2 times daily and record readings; Instructed to notify the Pharm D and or Cardiology promptly of abnormally high or low readings   Provided patient with printed educational materials related to What is High Blood  Pressure; Why Should I Limit Sodium?; Life's Simple 7  Reviewed scheduled/upcoming provider appointments including: Pharm D f/u 07/26/20 @8 :30 AM   Discussed plans with patient for ongoing care management follow up and provided patient with direct contact information for care management team  Patient Self Care Activities:   Continue to self administers medications as prescribed  Continue to monitor her BP at home 1-2 times daily and record readings  Notify the Pharm D and or Cardiology promptly of abnormally high or low readings   Continue to adhere to a low Sodium heart healthy diet   Attend all scheduled provider appointments  Call pharmacy for medication refills  Call provider office for new concerns or questions  Initial goal documentation       COMPLETED: I would like to continue to manage my diabetes (pt-stated)        Current Barriers:   Diabetes: T2DM; most recent A1c 6.8% on 09/01/18 (repeat A1c was missed, next PCP visit on 09/07/19) up a bit from 6.0%  Current antihyperglycemic regimen: Xutolphy 30 units daily o Patient assistance application submitted with CPhT, Caryl Pina Coleman's assistance; medication to arrive at PCP office when approved--still pending  denies hypoglycemic symptoms; denies hyperglycemic symptoms  Current meal patterns: o Patient's spouse recently  passed away, therefore patient has been eating mostly foods brought from friends and family.  She is aware of how her diet should be and tries to incorporate low carb/sugar at every meal.  Current exercise: n/a  Current blood glucose readings: FBG 90-130  Cardiovascular risk reduction: o Current hypertensive regimen: carvedilol, amlodipine o Current hyperlipidemia regimen: atorvastatin 40mg  daily (LDL 81 on 09/02/19)  Pharmacist Clinical Goal(s):   Over the next 90 days, patient with work with PharmD and primary care provider to address need related to optimization of medication management of chronic  conditions  Interventions: Clinic visit with patient on 09/07/19  Comprehensive medication review performed, medication list updated in electronic medical record  Reviewed & discussed the following diabetes-related information with patient: o Continue checking blood sugars as directed o Follow ADA recommended "diabetes-friendly" diet  (reviewed healthy snack/food options) o Discussed insulin/GLP-1 injection technique o Reviewed medication purpose/side effects-->patient denies adverse events  Patient Self Care Activities:   Patient will check blood glucose daily in the AM , document, and provide at future appointments  Patient will focus on medication adherence by continuing to take medications as prescribed  Patient will take medications as prescribed  Patient will contact provider with any episodes of hypoglycemia  Patient will report any questions or concerns to provider   Please see past updates related to this goal by clicking on the "Past Updates" button in the selected goal        The patient verbalized understanding of instructions, educational materials, and care plan provided today and declined offer to receive copy of patient instructions, educational materials, and care plan.   Telephone follow up appointment with care management team member scheduled for: 08/02/20  Lynne Logan, RN

## 2020-06-15 NOTE — Chronic Care Management (AMB) (Signed)
Chronic Care Management   Follow Up Note   06/14/2020 Name: Kimberly Gross MRN: [id number] DOB: [birth year]  Referred by: [name], FNP Reason for referral : Chronic Care Management (RNCM FU Call )   Kimberly Gross is a 69 y.o. year old female who is a primary care patient of [name], [place]. The CCM team was consulted for assistance with chronic disease management and care coordination needs.    Review of patient status, including review of consultants reports, relevant laboratory and other test results, and collaboration with appropriate care team members and the patient's provider was performed as part of comprehensive patient evaluation and provision of chronic care management services.    SDOH (Social Determinants of Health) assessments performed: Yes - no acute challenges identified  See Care Plan activities for detailed interventions related to [place])   Placed outbound CCM RN CM follow up call to patient for a care plan update.    Outpatient Encounter Medications as of 06/14/2020  Medication Sig Note  . ACCU-CHEK AVIVA PLUS test strip TEST BLOOD SUGAR TWICE DAILY   . Accu-Chek Softclix Lancets lancets USE AS DIRECTED  TO CHECK BLOOD GLUCOSE   . acetaminophen (TYLENOL) 500 MG tablet Take 1,000 mg by mouth 2 (two) times a day.   . Alcohol Swabs (B-D SINGLE USE SWABS REGULAR) PADS Use as directed to check blood sugars twice daily   . amLODipine (NORVASC) 10 MG tablet Take 1 tablet (10 mg total) by mouth daily.   Marland Kitchen aspirin 81 MG tablet Take 81 mg by mouth daily.  05/08/2015: Received from: External Pharmacy Received Sig:   . Blood Glucose Monitoring Suppl (ACCU-CHEK AVIVA PLUS) w/Device KIT Check blood sugar twice a day   . carvedilol (COREG) 25 MG tablet Take 1 tablet (25 mg total) by mouth 2 (two) times daily.   . clonazePAM (KLONOPIN) 0.5 MG tablet Take 1 tablet (0.5 mg total) by mouth 2 (two) times daily.   . divalproex (DEPAKOTE) 500 MG DR tablet 500 mg. 2 pills 2 times  per day 05/08/2015: Received from: External Pharmacy  . DM-APAP-CPM (CORICIDIN HBP PO) Take by mouth as needed.   . fenofibrate 160 MG tablet TAKE 1 TABLET BY MOUTH EVERY DAY   . ferrous sulfate 325 (65 FE) MG tablet Take 325 mg by mouth daily with breakfast.   . furosemide (LASIX) 40 MG tablet TAKE 1 TABLET ON MONDAY, WEDNESDAY AND FRIDAY   . hydrALAZINE (APRESOLINE) 100 MG tablet Take 1 tablet (100 mg total) by mouth 3 (three) times daily.   . Insulin Degludec-Liraglutide (XULTOPHY) 100-3.6 UNIT-MG/ML SOPN Inject 30 Units into the skin daily.   . Insulin Pen Needle (DROPLET PEN NEEDLES) 32G X 4 MM MISC USE WITH XULTOPHY ONE TIME DAILY.   . Multiple Vitamins-Minerals (MULTIVITAMIN ADULT PO) Use as directed 1 tablet in the mouth or throat daily. 05/08/2015: Received from: External Pharmacy Received Sig:   . traZODone (DESYREL) 50 MG tablet    . vitamin B-12 (CYANOCOBALAMIN) 250 MCG tablet Take 250 mcg by mouth daily.   . [DISCONTINUED] atorvastatin (LIPITOR) 40 MG tablet TAKE 1 TABLET BY MOUTH EVERYDAY AT BEDTIME    No facility-administered encounter medications on file as of 06/14/2020.     Objective:  Lab Results  Component Value Date   HGBA1C 8.3 (H) 03/06/2020   HGBA1C 6.5 (H) 12/05/2019   HGBA1C 6.7 (H) 09/07/2019   Lab Results  Component Value Date   MICROALBUR 30 09/07/2019   [place] 98 03/06/2020  CREATININE 2.37 (H) 03/06/2020   BP Readings from Last 3 Encounters:  05/22/20 (!) 148/82  04/25/20 (!) 146/85  04/10/20 138/76    Goals Addressed      Patient Stated   .  COMPLETED: "I am having trouble affording my new diabetes medicine" (pt-stated)        Current Barriers:  . Financial constraints  . Reported payor restrictions on coverage  Clinical Social Work Clinical Goal(s):  Marland Kitchen Over the next 30 days, patient will work with CCM PharmD to address needs related to medication assistance (goal re-established 07/04/19)  CCM RN CM Interventions: 06/14/20 call completed  with patient  . Determined patient is receiving PAP for assistance for Xultophy and continues to be engaged with the embedded Pharm D   Patient Self Care Activities:  . Self administers medications as prescribed . Calls pharmacy for medication refills . Calls provider office for new concerns or questions  Please see past updates related to this goal by clicking on the "Past Updates" button in the selected goal      .  "I check my blood sugar twice a day" (pt-stated)   Not on track     [place] (see longitudinal plan of care for additional care plan information)  Current Barriers:  Marland Kitchen Knowledge Deficits related to disease process and Self Health management of type 2 Diabetes  . Chronic Disease Management support and education needs related to Type 2 diabetes mellitus, Essential hypertension, Mixed Hyperlipidemia, Decreased strength of lower extremity, stage 4 chronic kidney disease   Nurse Case Manager Clinical Goal(s):  Marland Kitchen Over the next 90 days, patient will work with the CCM team and PCP to address needs related to disease education and support for improved Self Health management of type 2 Diabetes   CCM RN CM Interventions:  06/14/20 call completed with patient  . Inter-disciplinary care team collaboration (see longitudinal plan of care) . Evaluation of current treatment plan related to Diabetes and patient's adherence to plan as established by provider. . Provided education to patient re: current A1c is up to 8.3 from 6.5; Educated on daily glycemic control FBS 80-130, <180 after meals; Educated on 15'15' rule; Educated on dietary and exercise recommendations  . Reviewed medications with patient and discussed patient is adhering to her prescribed DM treatment plan; Determined patient is receiving PAP assistance for Xultophy and continues to be engaged with the embedded Pharm D; Current regimen:  o  o Insulin Degludec-Liraglutide (XULTOPHY) 100-3.6 UNIT-MG/ML SOPN o Inject 30 Units  into the skin daily.   . Provided patient with printed educational materials related to Diabetes Meal Planning; Diabetes Zone Safety Tool; Grocery Shopping with Diabetes; Diabetes Care Schedule; Preventing Complications from Diabetes . Advised patient, providing education and rationale, to check cbg before meals 1-2 times daily and record, calling the CCM team and or PCP for findings outside established parameters . Discussed plans with patient for ongoing care management follow up and provided patient with direct contact information for care management team  Patient Self Care Activities:  . Continue to Self administer medications as prescribed . Attend all scheduled provider appointments . Call pharmacy for medication refills . Call provider office for new concerns or questions . Follow dietary and exercise recommendations . Continue to monitor CBG 1-2 times daily before meals and record  Please see past updates related to this goal by clicking on the "Past Updates" button in the selected goal      .  "I have pain  everyday from my arthritis" (pt-stated)   On track     [place] (see longitudinal plan of care for additional care plan information)  Current Barriers:  Marland Kitchen Knowledge Deficits related to evaluation and treatment of chronic pain and decreased strength of lower extremity  . Chronic Disease Management support and education needs related to Type 2 diabetes mellitus, Essential hypertension, Mixed Hyperlipidemia, Decreased strength of lower extremity, stage 4 chronic kidney disease   Nurse Case Manager Clinical Goal(s):  Marland Kitchen Over the next 90 days, patient will work with the CCM team and PCP  to address needs related to evaluate and treat chronic pain and decreased strength to lower extremities   CCM RN CM Interventions:  06/14/20 call completed with patient  . Inter-disciplinary care team collaboration (see longitudinal plan of care) . Evaluation of current treatment plan  related to chronic pain and decreased strength to lower extremities and patient's adherence to plan as established by provider. . Provided education to patient re: signs and symptoms suggestive of arthritis; Educated on dietary and exercise recommendations to help decrease inflammation and promote muscle strength and endurance . Reviewed medications with patient and discussed patient is taking Acetaminophen 500 mg, 2 tablets twice daily for pain  . Discussed patient would like to be further evaluated for Osteoarthritis and or Rheumatoid Arthritis, she is not currently established with a Rheumatologist . Determined patient has used PT in the past for treatment of muscle weakness with good effectiveness  . Determined patient has a walker that she uses at all times with ambulation to help with balance and fall prevention, patient denies recent falls  . Discussed next scheduled PCP follow up appointment scheduled for 09/12/20 $RemoveBe'@10'muXUkXxyt$ :30 PM with [name] FNP  . Discussed plans with patient for ongoing care management follow up and provided patient with direct contact information for care management team  Patient Self Care Activities:  . Continue to self administers medications as prescribed . Attend all scheduled provider appointments . Call pharmacy for medication refills . Call provider office for new concerns or questions . Continue to take Acetaminophen as directed for pain . Continue to use DME with ambulation and wear supportive shoes  Please see past updates related to this goal by clicking on the "Past Updates" button in the selected goal      .  "to improve my kidney function" (pt-stated)   Not on track     [place] (see longitudinal plan of care for additional care plan information)  Current Barriers:  Marland Kitchen Knowledge Deficits related to disease process and Self Health management of Chronic Kidney Disease  . Chronic Disease Management support and education needs related to Type 2  diabetes mellitus, Essential hypertension, Mixed Hyperlipidemia, Decreased strength of lower extremity, stage 4 chronic kidney disease   Nurse Case Manager Clinical Goal(s):  Marland Kitchen Over the next 90 days, patient will work with the CCM team and PCP to address needs related to disease education and support for improved Self Health management of CKD   CCM RN CM Interventions:  06/14/20 call completed with patient  . Inter-disciplinary care team collaboration (see longitudinal plan of care) . Evaluation of current treatment plan related to stage 4 chronic kidney disease and patient's adherence to plan as established by provider. . Provided education to patient re: current GFR <24; Educated on Stages of chronic kidney disease; Educated on importance of staying well hydrated by drinking plenty of water; Educated on importance of keeping Hypertension and Diabetes under  good control . Determined patient is established with [facility], Dr. Moshe Cipro, who is monitoring her renal function closely  . Discussed plans with patient for ongoing care management follow up and provided patient with direct contact information for care management team  Patient Self Care Activities:  . Continue to self administer medications as prescribed . Attend all scheduled provider appointments . Call pharmacy for medication refills . Call provider office for new concerns or questions . Drink at least 64 oz of water daily unless otherwise directed by MD  Initial goal documentation     .  "to keep my blood pressure under good control" (pt-stated)   On track     [place] (see longitudinal plan of care for additional care plan information)  Current Barriers:  Marland Kitchen Knowledge Deficits related to disease process and Self Health management of Hypertension  . Chronic Disease Management support and education needs related to Type 2 diabetes mellitus, Essential hypertension, Mixed Hyperlipidemia, Decreased strength of  lower extremity, stage 4 chronic kidney disease   Nurse Case Manager Clinical Goal(s):  Marland Kitchen Over the next 90 days, patient will work with the CCM team and PCP to address needs related to disease education and support for improved Self Health management of Hypertension   CCM RN CM Interventions:  06/14/20 call completed with patient  . Inter-disciplinary care team collaboration (see longitudinal plan of care) . Evaluation of current treatment plan related to Hypertension and patient's adherence to plan as established by provider . Provided education to patient re: target BP <130/80; Educated on dietary and exercise recommendations . Determined patient is being followed by the [facility]; Determined she is being followed by Velora Heckler RPH-CPP to assist with medication monitoring and dosage adjustments as needed to improve Hypertension  . Reviewed medications with patient and discussed patient is adhering to her prescribed regimen per Cardiology; Current regimen:  o Amlodipine $RemoveBefo'10mg'VNZHELoFQEh$  daily (8am) o Carvedilol 25 mg twice daily (8am & 9pm) o Furosemide $RemoveBefo'40mg'LCIgsOZQpdi$  on MWF o Hydralazine $RemoveBefor'100mg'LgzcxjfrPHej$  three times daily (8am & noon & 9pm) . Instructed patient to continue to monitor her BP at home 1-2 times daily and record readings; Instructed to notify the Pharm D and or Cardiology promptly of abnormally high or low readings  . Provided patient with printed educational materials related to What is High Blood Pressure; Why Should I Limit Sodium?; Life's Simple 7 . Reviewed scheduled/upcoming provider appointments including: Pharm D f/u 07/26/20 $RemoveBef'@8'cMomSqwZmb$ :30 AM  . Discussed plans with patient for ongoing care management follow up and provided patient with direct contact information for care management team  Patient Self Care Activities:  . Continue to self administers medications as prescribed . Continue to monitor her BP at home 1-2 times daily and record readings . Notify the Pharm D  and or Cardiology promptly of abnormally high or low readings  . Continue to adhere to a low Sodium heart healthy diet  . Attend all scheduled provider appointments . Call pharmacy for medication refills . Call provider office for new concerns or questions  Initial goal documentation     .  COMPLETED: I would like to continue to manage my diabetes (pt-stated)        Current Barriers:  . Diabetes: T2DM; most recent A1c 6.8% on 09/01/18 (repeat A1c was missed, next PCP visit on 09/07/19) up a bit from 6.0% . Current antihyperglycemic regimen: Xutolphy 30 units daily o Patient assistance application submitted with [facility]Ph[employer], Judene Companion assistance;  medication to arrive at PCP office when approved--still pending . denies hypoglycemic symptoms; denies hyperglycemic symptoms . Current meal patterns: o Patient's spouse recently passed away, therefore patient has been eating mostly foods brought from friends and family.  She is aware of how her diet should be and tries to incorporate low carb/sugar at every meal. . Current exercise: n/a . Current blood glucose readings: FBG 90-130 . Cardiovascular risk reduction: o Current hypertensive regimen: carvedilol, amlodipine o Current hyperlipidemia regimen: atorvastatin 40mg  daily (LDL 81 on 09/02/19)  Pharmacist Clinical Goal(s):  Marland Kitchen Over the next 90 days, patient with work with PharmD and primary care provider to address need related to optimization of medication management of chronic conditions  Interventions: Clinic visit with patient on 09/07/19 . Comprehensive medication review performed, medication list updated in electronic medical record . Reviewed & discussed the following diabetes-related information with patient: o Continue checking blood sugars as directed o Follow ADA recommended "diabetes-friendly" diet  (reviewed healthy snack/food options) o Discussed insulin/GLP-1 injection technique o Reviewed medication purpose/side effects-->patient  denies adverse events  Patient Self Care Activities:  . Patient will check blood glucose daily in the AM , document, and provide at future appointments . Patient will focus on medication adherence by continuing to take medications as prescribed . Patient will take medications as prescribed . Patient will contact provider with any episodes of hypoglycemia . Patient will report any questions or concerns to provider   Please see past updates related to this goal by clicking on the "Past Updates" button in the selected goal        Plan:   Telephone follow up appointment with care management team member scheduled for: 08/02/20  Barb Merino, RN, BSN, CCM Care Management Coordinator [place] [facility]  Direct Phone: [phone]

## 2020-06-21 ENCOUNTER — Telehealth: Payer: Medicare (Managed Care)

## 2020-07-11 ENCOUNTER — Telehealth: Payer: Self-pay

## 2020-07-11 ENCOUNTER — Telehealth: Payer: Medicare (Managed Care)

## 2020-07-11 NOTE — Telephone Encounter (Signed)
  Chronic Care Management   Outreach Note  07/11/2020 Name: Kimberly Gross MRN: [id number] DOB: [birth year]  Referred by: [name], FNP Reason for referral : [place] is enrolled in a Managed Medicaid Health Plan: No  Third unsuccessful telephone outreach was attempted today. The patient was referred to the case management team for assistance with care management and care coordination. The patient's primary care provider has been notified of our unsuccessful attempts to make or maintain contact with the patient. The care management team is pleased to engage with this patient at any time in the future should he/she be interested in assistance from the care management team.   Follow Up Plan: No SW follow up planned at this time due to inability to maintain patient contact. Patient will remain active with RN Care Manager.  Daneen Schick, BSW, CDP Social Worker, Certified Dementia Practitioner [place] [facility] [place] [facility] [phone]

## 2020-07-26 ENCOUNTER — Ambulatory Visit (INDEPENDENT_AMBULATORY_CARE_PROVIDER_SITE_OTHER): Payer: Medicare (Managed Care) | Admitting: Pharmacist

## 2020-07-26 ENCOUNTER — Other Ambulatory Visit: Payer: Self-pay

## 2020-07-26 VITALS — BP 140/78 | HR 76 | Wt 278.6 lb

## 2020-07-26 DIAGNOSIS — I10 Essential (primary) hypertension: Secondary | ICD-10-CM | POA: Diagnosis not present

## 2020-07-26 LAB — BASIC METABOLIC PANEL
BUN/Creatinine Ratio: 18 (ref 12–28)
BUN: 40 mg/dL — ABNORMAL HIGH (ref 8–27)
CO2: 23 mmol/L (ref 20–29)
Calcium: 9.6 mg/dL (ref 8.7–10.3)
Chloride: 106 mmol/L (ref 96–106)
Creatinine, Ser: 2.28 mg/dL — ABNORMAL HIGH (ref 0.57–1.00)
GFR calc Af Amer: 25 mL/min/{1.73_m2} — ABNORMAL LOW (ref >59)
GFR calc non Af Amer: 21 mL/min/{1.73_m2} — ABNORMAL LOW (ref >59)
Glucose: 106 mg/dL — ABNORMAL HIGH (ref 65–99)
Potassium: 4.9 mmol/L (ref 3.5–5.2)
Sodium: 145 mmol/L — ABNORMAL HIGH (ref 134–144)

## 2020-07-26 NOTE — Patient Instructions (Signed)
Return for a  follow up appointment in [place] to the lab in [place] your blood pressure at home daily (if able) and keep record of the readings.  Take your BP meds as follows: *NO MEDICATION CHANGE*  Bring all of your meds, your BP cuff and your record of home blood pressures to your next appointment.  Exercise as you're able, try to walk approximately 30 minutes per day.  Keep salt intake to a minimum, especially watch canned and prepared boxed foods.  Eat more fresh fruits and vegetables and fewer canned items.  Avoid eating in fast food restaurants.    HOW TO TAKE YOUR BLOOD PRESSURE: . Rest 5 minutes before taking your blood pressure. .  Don't smoke or drink caffeinated beverages for at least 30 minutes before. . Take your blood pressure before (not after) you eat. . Sit comfortably with your back supported and both feet on the floor (don't cross your legs). . Elevate your arm to heart level on a table or a desk. . Use the proper sized cuff. It should fit smoothly and snugly around your bare upper arm. There should be enough room to slip a fingertip under the cuff. The bottom edge of the cuff should be 1 inch above the crease of the elbow. . Ideally, take 3 measurements at one sitting and record the average.

## 2020-07-26 NOTE — Progress Notes (Signed)
Patient ID: Kimberly Gross                 DOB: June 25, 1951                      MRN: [id number]     HPI: Kimberly Gross is a 70 y.o. female referred by Dr. Margaretann Loveless to HTN clinic. PMH includes hypertension, DM, hyperlipidemia,bipolar disorder and, CKD IV. Patient continues to have lots of social stressors but has the emotional support of her daughter. She encouraged Kimberly Gross to walk every day and help with cooking to decrease sodium intake as much as possible.  Patient presents to follow up accompany by her daughter. Denies problems with current therapy and repots compliance with all medication, but none taken this morning prior to appointment.   Current HTN meds:  Amlodipine $RemoveBef'10mg'cYqSUzXOrQ$  daily (8am) Carvedilol 25 mg twice daily (8am & 9pm) Furosemide $RemoveBeforeD'40mg'JdHlUIkEtbhMYG$  on MWF Hydralazine $RemoveBeforeD'100mg'JSyiGDSDIJlyWu$  three times daily (8am & noon & 9pm)  Intolerance: none  BP goal: 130/80 (140/90 if unable to tolerate)  Family History: The patient's family history includes Heart attack in her father; Hypertension in her mother.   Social History: denies tobacco use, drinks 1-2 caffeineated drink every day  Diet: still eating a mix of home and take out, but has done more home cooked recently.  Still eating some hot dogs, but not nearly as frequently, trying to get more vegetables; daughter still controlling her access to ice cream.  Daughter trying to adjust both of their diets to accommodate her diabetes.    Exercise: activies of daily living; lives with daughter and 4 grandkids (teens).Take her   Home BP readings: none provided  Wt Readings from Last 3 Encounters:  07/26/20 278 lb 9.6 oz (126.4 kg)  05/22/20 278 lb 3.2 oz (126.2 kg)  04/25/20 282 lb (127.9 kg)   BP Readings from Last 3 Encounters:  07/26/20 140/78  05/22/20 (!) 148/82  04/25/20 (!) 146/85   Pulse Readings from Last 3 Encounters:  07/26/20 76  05/22/20 77  04/25/20 86    Past Medical History:  Diagnosis Date  . Anemia   . Anxiety   . Bipolar 1  disorder ([place])   . CKD (chronic kidney disease), stage IV ([place])   . Depression   . Diabetes mellitus without complication ([place])   . Hyperlipemia   . Hypertension   . Occasional tremors     Current Outpatient Medications on File Prior to Visit  Medication Sig Dispense Refill  . ACCU-CHEK AVIVA PLUS test strip TEST BLOOD SUGAR TWICE DAILY 200 strip 3  . Accu-Chek Softclix Lancets lancets USE AS DIRECTED  TO CHECK BLOOD GLUCOSE 200 each 3  . acetaminophen (TYLENOL) 500 MG tablet Take 1,000 mg by mouth 2 (two) times a day.    . Alcohol Swabs (B-D SINGLE USE SWABS REGULAR) PADS Use as directed to check blood sugars twice daily 100 each 5  . amLODipine (NORVASC) 10 MG tablet Take 1 tablet (10 mg total) by mouth daily. 90 tablet 1  . aspirin 81 MG tablet Take 81 mg by mouth daily.     Marland Kitchen atorvastatin (LIPITOR) 40 MG tablet TAKE 1 TABLET BY MOUTH EVERYDAY AT BEDTIME 90 tablet 0  . Blood Glucose Monitoring Suppl (ACCU-CHEK AVIVA PLUS) w/Device KIT Check blood sugar twice a day 1 kit 0  . carvedilol (COREG) 25 MG tablet Take 1 tablet (25 mg total) by mouth 2 (two) times daily. 180 tablet 3  .  clonazePAM (KLONOPIN) 0.5 MG tablet Take 1 tablet (0.5 mg total) by mouth 2 (two) times daily. 30 tablet 0  . divalproex (DEPAKOTE) 500 MG DR tablet 500 mg. 2 pills 2 times per day    . DM-APAP-CPM (CORICIDIN HBP PO) Take by mouth as needed.    . fenofibrate 160 MG tablet TAKE 1 TABLET BY MOUTH EVERY DAY 90 tablet 1  . ferrous sulfate 325 (65 FE) MG tablet Take 325 mg by mouth daily with breakfast.    . furosemide (LASIX) 40 MG tablet TAKE 1 TABLET ON MONDAY, WEDNESDAY AND FRIDAY 39 tablet 3  . hydrALAZINE (APRESOLINE) 100 MG tablet Take 1 tablet (100 mg total) by mouth 3 (three) times daily. 90 tablet 1  . Insulin Degludec-Liraglutide (XULTOPHY) 100-3.6 UNIT-MG/ML SOPN Inject 30 Units into the skin daily. 15 mL 1  . Insulin Pen Needle (DROPLET PEN NEEDLES) 32G X 4 MM MISC USE WITH XULTOPHY ONE TIME DAILY. 100  each 2  . Multiple Vitamins-Minerals (MULTIVITAMIN ADULT PO) Use as directed 1 tablet in the mouth or throat daily.    . traZODone (DESYREL) 50 MG tablet     . vitamin B-12 (CYANOCOBALAMIN) 250 MCG tablet Take 250 mcg by mouth daily.     No current facility-administered medications on file prior to visit.    Allergies  Allergen Reactions  . Chlorpromazine Rash    Blood pressure 140/78, pulse 76, weight 278 lb 9.6 oz (126.4 kg), SpO2 94 %.  Essential hypertension Blood pressure better controlled today. Patient reports compliance with positive lifestyle medication and medication. BP is 140/78 before morning dose of hydralazine.  No changes in therapy at this time. Plan to follow up as needed with HTN clinic and in 2 months with DR Margaretann Loveless.  Laurelin Elson Rodriguez-Guzman PharmD, BCPS, [place] [address] [place] [address] [place] [address] 08/01/2020 1:58 PM

## 2020-07-30 ENCOUNTER — Encounter: Payer: Self-pay | Admitting: Nurse Practitioner

## 2020-08-01 ENCOUNTER — Encounter: Payer: Self-pay | Admitting: Pharmacist

## 2020-08-01 ENCOUNTER — Telehealth: Payer: Self-pay | Admitting: Adult Health

## 2020-08-01 NOTE — Telephone Encounter (Signed)
Spoke to pt.  She states that she gets SOB on exertion, using her machine a lot.  I relayed that her CPAP machine helps with her feeling less sleepy, would not have anything to do with her being SOB,  Recommended her to contact her pcp for evaluation.  She is thinking getting a new machine would help with this.  She is not on oxygen.  She said she will get in touch with her pcp to try and get in sooner. mi

## 2020-08-01 NOTE — Assessment & Plan Note (Signed)
Blood pressure better controlled today. Patient reports compliance with positive lifestyle medication and medication. BP is 140/78 before morning dose of hydralazine.  No changes in therapy at this time. Plan to follow up as needed with HTN clinic and in 2 months with DR Margaretann Loveless.

## 2020-08-01 NOTE — Telephone Encounter (Signed)
Pt states her CPAP cuts off in the middle of the night, she would like a call to discuss what to do.

## 2020-08-02 ENCOUNTER — Telehealth: Payer: Self-pay

## 2020-08-02 ENCOUNTER — Telehealth: Payer: Medicare (Managed Care)

## 2020-08-02 NOTE — Telephone Encounter (Cosign Needed)
  Chronic Care Management   Outreach Note  08/02/2020 Name: Kimberly Gross MRN: [id number] DOB: Jun 28, 1951  Referred by: [name], FNP Reason for referral : Chronic Care Management (#2 RN CM FU Call )   An unsuccessful telephone outreach was attempted today. The patient was referred to the case management team for assistance with care management and care coordination.   Follow Up Plan: Telephone follow up appointment with care management team member scheduled for: 09/07/20  Barb Merino, RN, BSN, CCM Care Management Coordinator [place] [facility]  Direct Phone: [phone]

## 2020-08-02 NOTE — Telephone Encounter (Signed)
I called pt and was not able to LM for pt. (noted to call back later).

## 2020-08-02 NOTE — Telephone Encounter (Signed)
I reviewed CPAP download.  Patient is not consistently using her CPAP.  According to the download she used it 22 out of 30 days for compliance of 73%.  She used a greater than 4 hours only 9 days for compliance of 30%.  She has a high leak with the mask.  Please encourage the patient to use the machine nightly.  Ensure that her mask has a good seal.  If she is experiencing SOB during the day that she needs to contact her PCP

## 2020-08-06 NOTE — Telephone Encounter (Signed)
Yes she will need to come in for appointment

## 2020-08-06 NOTE — Telephone Encounter (Signed)
Called pt and made appt for her at 08-09-20 at 1300 for cpap f.u.  She verbalized understanding.

## 2020-08-06 NOTE — Telephone Encounter (Signed)
I called pt and tried to relayed about her machine, she states is not picking up that she is is using her machine very night and over 4 hours her machine is not picking it up.  I relayed that per her report is picking up the inforamtion.  She feels that she has gotten hre supplies/ mask but is the machine.  I placed her on list to call if cancellation.  She states she needs order for new machine.   I reiterated that she is using, as per download , but not long enough.

## 2020-08-08 ENCOUNTER — Other Ambulatory Visit: Payer: Self-pay | Admitting: Internal Medicine

## 2020-08-09 ENCOUNTER — Ambulatory Visit: Payer: Medicare (Managed Care) | Admitting: Adult Health

## 2020-08-09 ENCOUNTER — Other Ambulatory Visit: Payer: Self-pay

## 2020-08-09 ENCOUNTER — Encounter: Payer: Self-pay | Admitting: Adult Health

## 2020-08-09 VITALS — BP 148/80 | HR 87 | Ht 63.0 in | Wt 286.0 lb

## 2020-08-09 DIAGNOSIS — G4733 Obstructive sleep apnea (adult) (pediatric): Secondary | ICD-10-CM

## 2020-08-09 DIAGNOSIS — Z9989 Dependence on other enabling machines and devices: Secondary | ICD-10-CM | POA: Diagnosis not present

## 2020-08-09 NOTE — Progress Notes (Signed)
PATIENT: Kimberly Gross DOB: 08-10-1950  REASON FOR VISIT: follow up HISTORY FROM: patient  HISTORY OF PRESENT ILLNESS: Today 08/09/20:  Kimberly Gross is a 70 year old female with a history of obstructive sleep apnea on CPAP.  The patient states that her machine is not working properly.  She states that she uses the machine every night.  Reports that she does not take her mask off during the night but the machine is reassuring that she does not use her machine greater than 4 hours most nights.  Reports that [facility].  Was told that she may need a new machine per patient?  Her download is below  Compliance Report Usage 05/11/2020 - 08/08/2020 Usage days 63/90 days (70%) >= 4 hours 23 days (26%) Average usage (days used) 2 hours 52 minutes  Serial number [id number] Mode Spont IPAP 17 cmH2O EPAP 13 cmH2O Easy-Breathe   Therapy Leaks - L/min Median: 47.0 95th percentile: 86.4 Maximum: 103.3 Events per hour AI: 4.7 HI: 0.2 AHI: 4.9 Apnea Index Central: 0.1 Obstructive: 0.8 Unknown: 3.8    REVIEW OF SYSTEMS: Out of a complete 14 system review of symptoms, the patient complains only of the following symptoms, and all other reviewed systems are negative.  FSS 54  ALLERGIES: Allergies  Allergen Reactions  . Chlorpromazine Rash    HOME MEDICATIONS: Outpatient Medications Prior to Visit  Medication Sig Dispense Refill  . ACCU-CHEK AVIVA PLUS test strip TEST BLOOD SUGAR TWICE DAILY 200 strip 3  . Accu-Chek Softclix Lancets lancets USE AS DIRECTED  TO CHECK BLOOD GLUCOSE 200 each 3  . acetaminophen (TYLENOL) 500 MG tablet Take 1,000 mg by mouth 2 (two) times a day.    . Alcohol Swabs (B-D SINGLE USE SWABS REGULAR) PADS Use as directed to check blood sugars twice daily 100 each 5  . amLODipine (NORVASC) 10 MG tablet Take 1 tablet (10 mg total) by mouth daily. 90 tablet 1  . aspirin 81 MG tablet Take 81 mg by mouth daily.     Marland Kitchen  atorvastatin (LIPITOR) 40 MG tablet TAKE 1 TABLET BY MOUTH EVERYDAY AT BEDTIME 90 tablet 0  . Blood Glucose Monitoring Suppl (ACCU-CHEK AVIVA PLUS) w/Device KIT Check blood sugar twice a day 1 kit 0  . carvedilol (COREG) 25 MG tablet Take 1 tablet (25 mg total) by mouth 2 (two) times daily. 180 tablet 3  . clonazePAM (KLONOPIN) 0.5 MG tablet Take 1 tablet (0.5 mg total) by mouth 2 (two) times daily. 30 tablet 0  . divalproex (DEPAKOTE) 500 MG DR tablet 500 mg. 2 pills 2 times per day    . DM-APAP-CPM (CORICIDIN HBP PO) Take by mouth as needed.    . fenofibrate 160 MG tablet TAKE 1 TABLET BY MOUTH EVERY DAY 90 tablet 1  . ferrous sulfate 325 (65 FE) MG tablet Take 325 mg by mouth daily with breakfast.    . furosemide (LASIX) 40 MG tablet TAKE 1 TABLET ON MONDAY, WEDNESDAY AND FRIDAY 39 tablet 3  . hydrALAZINE (APRESOLINE) 100 MG tablet Take 1 tablet (100 mg total) by mouth 3 (three) times daily. 90 tablet 1  . Insulin Degludec-Liraglutide (XULTOPHY) 100-3.6 UNIT-MG/ML SOPN Inject 30 Units into the skin daily. 15 mL 1  . Insulin Pen Needle (DROPLET PEN NEEDLES) 32G X 4 MM MISC USE WITH XULTOPHY ONE TIME DAILY. 100 each 2  . Multiple Vitamins-Minerals (MULTIVITAMIN ADULT PO) Use as directed 1 tablet in the  mouth or throat daily.    . traZODone (DESYREL) 50 MG tablet     . vitamin B-12 (CYANOCOBALAMIN) 250 MCG tablet Take 250 mcg by mouth daily.     No facility-administered medications prior to visit.    PAST MEDICAL HISTORY: Past Medical History:  Diagnosis Date  . Anemia   . Anxiety   . Bipolar 1 disorder ([place])   . CKD (chronic kidney disease), stage IV ([place])   . Depression   . Diabetes mellitus without complication ([place])   . Hyperlipemia   . Hypertension   . Occasional tremors     PAST SURGICAL HISTORY: Past Surgical History:  Procedure Laterality Date  . VAGINAL HYSTERECTOMY      FAMILY HISTORY: Family History  Problem Relation Age of Onset  . Hypertension Mother   . Heart  attack Father   . Breast cancer Neg Hx     SOCIAL HISTORY: Social History   Socioeconomic History  . Marital status: Widowed    Spouse name: Not on file  . Number of children: 2  . Years of education: Not on file  . Highest education level: Not on file  Occupational History  . Occupation: retired  Tobacco Use  . Smoking status: Never Smoker  . Smokeless tobacco: Never Used  Vaping Use  . Vaping Use: Never used  Substance and Sexual Activity  . Alcohol use: No  . Drug use: No  . Sexual activity: Not Currently  Other Topics Concern  . Not on file  Social History Narrative   Drinks 1-2 caffeine drinks a day    Her daughter & 4 grandchildren are living with her   Social Determinants of Health   Financial Resource Strain: Low Risk   . Difficulty of Paying Living Expenses: Not hard at all  Food Insecurity: No Food Insecurity  . Worried About Charity fundraiser in the Last Year: Never true  . Ran Out of Food in the Last Year: Never true  Transportation Needs: No Transportation Needs  . Lack of Transportation (Medical): No  . Lack of Transportation (Non-Medical): No  Physical Activity: Inactive  . Days of Exercise per Week: 0 days  . Minutes of Exercise per Session: 0 min  Stress: No Stress Concern Present  . Feeling of Stress : Not at all  Social Connections: Not on file  Intimate Partner Violence: Not on file      PHYSICAL EXAM  Vitals:   08/09/20 1308  BP: (!) 148/80  Pulse: 87  Weight: 286 lb (129.7 kg)  Height: $Remove'5\' 3"'cyzwxXr$  (1.6 m)   Body mass index is 50.66 kg/m.  Generalized: Well developed, in no acute distress  Chest: Lungs clear to auscultation bilaterally  Neurological examination  Mentation: Alert oriented to time, place, history taking. Follows all commands speech and language fluent Cranial nerve II-XII: Extraocular movements were full, visual field were full on confrontational test Head turning and shoulder shrug  were normal and symmetric. Motor:  The motor testing reveals 5 over 5 strength of all 4 extremities. Good symmetric motor tone is noted throughout.  Sensory: Sensory testing is intact to soft touch on all 4 extremities. No evidence of extinction is noted.  Gait and station: Gait is normal.    DIAGNOSTIC DATA (LABS, IMAGING, TESTING) - I reviewed patient records, labs, notes, testing and imaging myself where available.  Lab Results  Component Value Date   WBC 9.2 09/07/2019   HGB 13.2 09/07/2019   HCT 41.0 09/07/2019  MCV 92 09/07/2019   PLT 288 09/07/2019      Component Value Date/Time   NA 145 (H) 07/26/2020 0931   K 4.9 07/26/2020 0931   CL 106 07/26/2020 0931   CO2 23 07/26/2020 0931   GLUCOSE 106 (H) 07/26/2020 0931   GLUCOSE 84 09/10/2010 0650   BUN 40 (H) 07/26/2020 0931   CREATININE 2.28 (H) 07/26/2020 0931   CALCIUM 9.6 07/26/2020 0931   PROT 7.0 03/06/2020 1212   ALBUMIN 4.7 03/06/2020 1212   AST 15 03/06/2020 1212   ALT 11 03/06/2020 1212   ALKPHOS 71 03/06/2020 1212   BILITOT 0.2 03/06/2020 1212   GFRNONAA 21 (L) 07/26/2020 0931   GFRAA 25 (L) 07/26/2020 0931   Lab Results  Component Value Date   CHOL 191 03/06/2020   HDL 54 03/06/2020   LDLCALC 98 03/06/2020   TRIG 228 (H) 03/06/2020   CHOLHDL 3.5 03/06/2020   Lab Results  Component Value Date   HGBA1C 8.3 (H) 03/06/2020   No results found for: [id number] Lab Results  Component Value Date   TSH 1.610 09/01/2018      ASSESSMENT AND PLAN 70 y.o. year old female  has a past medical history of Anemia, Anxiety, Bipolar 1 disorder ([place]), CKD (chronic kidney disease), stage IV ([place]), Depression, Diabetes mellitus without complication ([place]), Hyperlipemia, Hypertension, and Occasional tremors. here with:  1. OSA on CPAP  -Patient states that her machine is not registering her time correctly.  She reports that she is using her machine every night and not taking the mask off.  She would like a new machine.  Advised that I will send an  order for new BiPAP machine.  Explained that the DME company would let us know if her insurance would cover this.  She will keep her follow-up in March.   I spent 25 minutes of face-to-face and non-face-to-face time with patient.  This included previsit chart review, lab review, study review, order entry, electronic health record documentation, patient education.  Ward Givens, MSN, NP-C 08/09/2020, 1:31 PM [facility] [address] [place], [place] [address] [phone]

## 2020-08-09 NOTE — Patient Instructions (Signed)
Your Plan:  Order sent for new machine If your symptoms worsen or you develop new symptoms please let us know.    Thank you for coming to see [place] at [facility]. I hope we have been able to provide you high quality care today.  You may receive a patient satisfaction survey over the next few weeks. We would appreciate your feedback and comments so that we may continue to improve ourselves and the health of our patients.

## 2020-08-13 NOTE — Progress Notes (Signed)
CM sent to [employer]  

## 2020-09-05 LAB — HM DIABETES EYE EXAM

## 2020-09-07 ENCOUNTER — Telehealth: Payer: Medicare (Managed Care)

## 2020-09-07 ENCOUNTER — Telehealth: Payer: Self-pay

## 2020-09-07 NOTE — Telephone Encounter (Signed)
  Chronic Care Management   Outreach Note  09/07/2020 Name: Kimberly Gross MRN: [id number] DOB: 10/27/1950  Referred by: [name], FNP Reason for referral : Chronic Care Management (#2 RN CM FU Call Attempt )   A second unsuccessful telephone outreach was attempted today. The patient was referred to the case management team for assistance with care management and care coordination.   Follow Up Plan: A HIPAA compliant phone message was left for the patient providing contact information and requesting a return call. Telephone follow up appointment with care management team member scheduled for: 10/03/20  Barb Merino, RN, BSN, CCM Care Management Coordinator [place] [facility]  Direct Phone: [phone]

## 2020-09-10 ENCOUNTER — Encounter: Payer: Self-pay | Admitting: Nurse Practitioner

## 2020-09-12 ENCOUNTER — Other Ambulatory Visit: Payer: Self-pay

## 2020-09-12 ENCOUNTER — Ambulatory Visit (INDEPENDENT_AMBULATORY_CARE_PROVIDER_SITE_OTHER): Payer: Medicare (Managed Care) | Admitting: Nurse Practitioner

## 2020-09-12 ENCOUNTER — Encounter: Payer: Self-pay | Admitting: Nurse Practitioner

## 2020-09-12 VITALS — BP 124/80 | HR 81 | Temp 98.2°F | Ht 62.2 in | Wt 260.0 lb

## 2020-09-12 DIAGNOSIS — F3132 Bipolar disorder, current episode depressed, moderate: Secondary | ICD-10-CM

## 2020-09-12 DIAGNOSIS — M2550 Pain in unspecified joint: Secondary | ICD-10-CM

## 2020-09-12 DIAGNOSIS — I129 Hypertensive chronic kidney disease with stage 1 through stage 4 chronic kidney disease, or unspecified chronic kidney disease: Secondary | ICD-10-CM

## 2020-09-12 DIAGNOSIS — Z0000 Encounter for general adult medical examination without abnormal findings: Secondary | ICD-10-CM | POA: Diagnosis not present

## 2020-09-12 DIAGNOSIS — Z1211 Encounter for screening for malignant neoplasm of colon: Secondary | ICD-10-CM

## 2020-09-12 DIAGNOSIS — N184 Chronic kidney disease, stage 4 (severe): Secondary | ICD-10-CM | POA: Diagnosis not present

## 2020-09-12 DIAGNOSIS — E119 Type 2 diabetes mellitus without complications: Secondary | ICD-10-CM

## 2020-09-12 DIAGNOSIS — E782 Mixed hyperlipidemia: Secondary | ICD-10-CM

## 2020-09-12 DIAGNOSIS — E1122 Type 2 diabetes mellitus with diabetic chronic kidney disease: Secondary | ICD-10-CM

## 2020-09-12 DIAGNOSIS — I10 Essential (primary) hypertension: Secondary | ICD-10-CM

## 2020-09-12 DIAGNOSIS — E559 Vitamin D deficiency, unspecified: Secondary | ICD-10-CM

## 2020-09-12 LAB — POCT URINALYSIS DIPSTICK
Bilirubin, UA: NEGATIVE
Blood, UA: NEGATIVE
Glucose, UA: NEGATIVE
Ketones, UA: NEGATIVE
Leukocytes, UA: NEGATIVE
Nitrite, UA: NEGATIVE
Protein, UA: NEGATIVE
Spec Grav, UA: 1.015 (ref 1.010–1.025)
Urobilinogen, UA: 0.2 E.U./dL
pH, UA: 5 (ref 5.0–8.0)

## 2020-09-12 LAB — POCT UA - MICROALBUMIN
Albumin/Creatinine Ratio, Urine, POC: <30
Creatinine, POC: 50 mg/dL
Microalbumin Ur, POC: 10 mg/L

## 2020-09-12 NOTE — Progress Notes (Signed)
I,Yamilka [employer] as a Education administrator for [employer], FNP.,have documented all relevant documentation on the behalf of [name], FNP,as directed by  [name], FNP while in the presence of [name], [place]. This visit occurred during the SARS-CoV-2 public health emergency.  Safety protocols were in place, including screening questions prior to the visit, additional usage of staff PPE, and extensive cleaning of exam room while observing appropriate contact time as indicated for disinfecting solutions.  Subjective:     Patient ID: Kimberly Gross , female    DOB: 01-Jun-1951 , 70 y.o.   MRN: [id number]   Chief Complaint  Patient presents with  . Annual Exam    HPI  Patient here for hm.   Wt Readings from Last 3 Encounters: 09/12/20 : 260 lb (117.9 kg) 08/09/20 : 286 lb (129.7 kg) 07/26/20 : 278 lb 9.6 oz (126.4 kg)  She reports she has been cutting back on her bad foods. She is walking her dog and does Charlean Sanfilippo, she has exercising balls to exercise her hand strength.  She is due to see the Nephrologist March 16th.    Her daughter is here with her today.   Diabetes She presents for her follow-up diabetic visit. She has type 2 diabetes mellitus. No MedicAlert identification noted. Her disease course has been stable. Pertinent negatives for hypoglycemia include no dizziness or headaches. Pertinent negatives for diabetes include no blurred vision, no fatigue, no polydipsia, no polyphagia and no polyuria. There are no hypoglycemic complications. Symptoms are stable. There are no diabetic complications. Pertinent negatives for diabetic complications include no CVA. Risk factors for coronary artery disease include obesity and sedentary lifestyle. Current diabetic treatment includes insulin injections and oral agent (dual therapy) (Xultophy 30 units daily.  ). She is compliant with treatment all of the time. Her weight is stable. She is following a generally healthy diet. When  asked about meal planning, she reported none. She has had a previous visit with a dietitian. She participates in exercise three times a week. Her home blood glucose trend is decreasing steadily. (Blood sugars from 100-160) An ACE inhibitor/angiotensin II receptor blocker is being taken. She does not see a podiatrist.Eye exam is current.  Hypertension This is a chronic problem. The current episode started more than 1 year ago. The problem is unchanged. The problem is controlled. Pertinent negatives include no blurred vision or headaches. There are no associated agents to hypertension. There are no known risk factors for coronary artery disease. Past treatments include ACE inhibitors. The current treatment provides no improvement. There are no compliance problems.  There is no history of angina or CVA. There is no history of chronic renal disease.     Past Medical History:  Diagnosis Date  . Anemia   . Anxiety   . Bipolar 1 disorder ([place])   . CKD (chronic kidney disease), stage IV ([place])   . Depression   . Diabetes mellitus without complication ([place])   . Hyperlipemia   . Hypertension   . Occasional tremors      Family History  Problem Relation Age of Onset  . Hypertension Mother   . Heart attack Father   . Breast cancer Neg Hx      Current Outpatient Medications:  .  ACCU-CHEK AVIVA PLUS test strip, TEST BLOOD SUGAR TWICE DAILY, Disp: 200 strip, Rfl: 3 .  Accu-Chek Softclix Lancets lancets, USE AS DIRECTED  TO CHECK BLOOD GLUCOSE, Disp: 200 each, Rfl: 3 .  acetaminophen (TYLENOL) 500  MG tablet, Take 1,000 mg by mouth 2 (two) times a day., Disp: , Rfl:  .  Alcohol Swabs (B-D SINGLE USE SWABS REGULAR) PADS, Use as directed to check blood sugars twice daily, Disp: 100 each, Rfl: 5 .  amLODipine (NORVASC) 10 MG tablet, Take 1 tablet (10 mg total) by mouth daily., Disp: 90 tablet, Rfl: 1 .  aspirin 81 MG tablet, Take 81 mg by mouth daily. , Disp: , Rfl:  .  atorvastatin (LIPITOR) 40 MG  tablet, TAKE 1 TABLET BY MOUTH EVERYDAY AT BEDTIME, Disp: 90 tablet, Rfl: 0 .  BIOTIN PO, Take 1 tablet by mouth daily at 12 noon., Disp: , Rfl:  .  Blood Glucose Monitoring Suppl (ACCU-CHEK AVIVA PLUS) w/Device KIT, Check blood sugar twice a day, Disp: 1 kit, Rfl: 0 .  carvedilol (COREG) 25 MG tablet, Take 1 tablet (25 mg total) by mouth 2 (two) times daily., Disp: 180 tablet, Rfl: 3 .  cholecalciferol (VITAMIN D3) 25 MCG (1000 UNIT) tablet, Take 1,000 Units by mouth daily., Disp: , Rfl:  .  clonazePAM (KLONOPIN) 0.5 MG tablet, Take 1 tablet (0.5 mg total) by mouth 2 (two) times daily., Disp: 30 tablet, Rfl: 0 .  divalproex (DEPAKOTE) 500 MG DR tablet, 500 mg. 2 pills 2 times per day, Disp: , Rfl:  .  DM-APAP-CPM (CORICIDIN HBP PO), Take by mouth as needed., Disp: , Rfl:  .  fenofibrate 160 MG tablet, TAKE 1 TABLET BY MOUTH EVERY DAY, Disp: 90 tablet, Rfl: 1 .  ferrous sulfate 325 (65 FE) MG tablet, Take 325 mg by mouth daily with breakfast., Disp: , Rfl:  .  furosemide (LASIX) 40 MG tablet, TAKE 1 TABLET ON MONDAY, WEDNESDAY AND FRIDAY, Disp: 39 tablet, Rfl: 3 .  hydrALAZINE (APRESOLINE) 100 MG tablet, Take 1 tablet (100 mg total) by mouth 3 (three) times daily., Disp: 90 tablet, Rfl: 1 .  Insulin Degludec-Liraglutide (XULTOPHY) 100-3.6 UNIT-MG/ML SOPN, Inject 30 Units into the skin daily., Disp: 15 mL, Rfl: 1 .  Insulin Pen Needle (DROPLET PEN NEEDLES) 32G X 4 MM MISC, USE WITH XULTOPHY ONE TIME DAILY., Disp: 100 each, Rfl: 2 .  Multiple Vitamins-Minerals (MULTIVITAMIN ADULT PO), Use as directed 1 tablet in the mouth or throat daily., Disp: , Rfl:  .  traZODone (DESYREL) 50 MG tablet, , Disp: , Rfl:  .  vitamin B-12 (CYANOCOBALAMIN) 250 MCG tablet, Take 250 mcg by mouth daily., Disp: , Rfl:    Allergies  Allergen Reactions  . Chlorpromazine Rash      The patient states she uses status post hysterectomy for birth control.  Negative for: breast discharge, breast lump(s), breast pain and  breast self exam. Associated symptoms include abnormal vaginal bleeding. Pertinent negatives include abnormal bleeding (hematology), anxiety, decreased libido, depression, difficulty falling sleep, dyspareunia, history of infertility, nocturia, sexual dysfunction, sleep disturbances, urinary incontinence, urinary urgency, vaginal discharge and vaginal itching. Diet regular. The patient states her exercise level is minimal she is walking her dog, she will also look at exercise tapes. She is helping care for her daughters children.   The patient's tobacco use is:  Social History   Tobacco Use  Smoking Status Never Smoker  Smokeless Tobacco Never Used   She has been exposed to passive smoke. The patient's alcohol use is:  Social History   Substance and Sexual Activity  Alcohol Use No    Review of Systems  Constitutional: Negative.  Negative for fatigue.  HENT: Negative.   Eyes: Negative.  Negative  for blurred vision.  Respiratory: Negative.   Cardiovascular: Negative.   Gastrointestinal: Negative.   Endocrine: Negative.  Negative for polydipsia, polyphagia and polyuria.  Genitourinary: Negative.   Musculoskeletal: Positive for arthralgias (she is having joint pain all over - will take tylenol all over).  Skin: Negative.   Allergic/Immunologic: Negative.   Neurological: Negative.  Negative for dizziness and headaches.  Hematological: Negative.   Psychiatric/Behavioral: Negative.      Today's Vitals   09/12/20 1052  BP: 124/80  Pulse: 81  Temp: 98.2 F (36.8 C)  TempSrc: Oral  Weight: 260 lb (117.9 kg)  Height: 5' 2.2" (1.58 m)  PainSc: 0-No pain   Body mass index is 47.25 kg/m.   Objective:  Physical Exam Constitutional:      General: She is not in acute distress.    Appearance: Normal appearance. She is well-developed. She is obese.  HENT:     Head: Normocephalic and atraumatic.     Right Ear: Hearing, tympanic membrane, ear canal and external ear normal. There is  no impacted cerumen.     Left Ear: Hearing, tympanic membrane, ear canal and external ear normal. There is no impacted cerumen.     Nose:     Comments: Deferred - masked    Mouth/Throat:     Comments: Deferred - masked Eyes:     General: Lids are normal.     Extraocular Movements: Extraocular movements intact.     Conjunctiva/sclera: Conjunctivae normal.     Pupils: Pupils are equal, round, and reactive to light.     Funduscopic exam:    Right eye: No papilledema.        Left eye: No papilledema.  Neck:     Thyroid: No thyroid mass.     Vascular: No carotid bruit.  Cardiovascular:     Rate and Rhythm: Normal rate and regular rhythm.     Pulses: Normal pulses.     Heart sounds: Normal heart sounds. No murmur heard.   Pulmonary:     Effort: Pulmonary effort is normal. No respiratory distress.     Breath sounds: Normal breath sounds. No wheezing.  Chest:     Chest wall: No mass.  Breasts:     Tanner Score is 5.     Right: Normal. No mass, tenderness, axillary adenopathy or supraclavicular adenopathy.     Left: Normal. No mass, tenderness, axillary adenopathy or supraclavicular adenopathy.    Abdominal:     General: Abdomen is flat. Bowel sounds are normal. There is no distension.     Palpations: Abdomen is soft.     Tenderness: There is no abdominal tenderness.  Musculoskeletal:        General: No swelling. Normal range of motion.     Cervical back: Full passive range of motion without pain, normal range of motion and neck supple.     Right lower leg: No edema.     Left lower leg: No edema.  Lymphadenopathy:     Upper Body:     Right upper body: No supraclavicular, axillary or pectoral adenopathy.     Left upper body: No supraclavicular, axillary or pectoral adenopathy.  Skin:    General: Skin is warm and dry.     Capillary Refill: Capillary refill takes less than 2 seconds.  Neurological:     General: No focal deficit present.     Mental Status: She is alert and  oriented to person, place, and time.     Cranial Nerves: No  cranial nerve deficit.     Sensory: No sensory deficit.     Motor: No weakness.  Psychiatric:        Mood and Affect: Mood normal.        Behavior: Behavior normal.        Thought Content: Thought content normal.        Judgment: Judgment normal.         Assessment And Plan:     1. Encounter for general adult medical examination w/o abnormal findings . Behavior modifications discussed and diet history reviewed.   . Pt will continue to exercise regularly and modify diet with low GI, plant based foods and decrease intake of processed foods.  . Recommend intake of daily multivitamin, Vitamin D, and calcium.  . Recommend mammogram and colonoscopy (mammogram is up to date, ordered colonoscopy today) for preventive screenings, as well as recommend immunizations that include influenza, TDAP   2. Essential hypertension . B/P is controlled.  . CMP ordered to check renal function.  . The importance of regular exercise and dietary modification was stressed to the patient.  . Stressed importance of losing ten percent of her body weight to help with B/P control.  . The weight loss would help with decreasing cardiac and cancer risk as well.  . EKG done no change from previous, she is also followed by Cardiology - POCT Urinalysis Dipstick (81002) - EKG 12-Lead - CMP14+EGFR  3. Mixed hyperlipidemia  Chronic, controlled  Continue with current medications, tolerating medications well.  - Lipid panel  4. Vitamin D deficiency  Will check vitamin D level and supplement as needed.     Also encouraged to spend 15 minutes in the sun daily.  - VITAMIN D 25 Hydroxy (Vit-D Deficiency, Fractures)  5. Type 2 diabetes mellitus with stage 4 chronic kidney disease out complication, without long-term current use of insulin (HCC)  Chronic, controlled  Continue with current medications  Encouraged to limit intake of sugary foods and  drinks  Encouraged to increase physical activity to 150 minutes per week  Diabetic foot exam done, she has bilateral lower extremity trace edema otherwise normal  She is encouraged to have her eye exam done - POCT UA - Microalbumin - CMP14+EGFR - CBC - Hemoglobin A1c  6. Bipolar affective disorder, currently depressed, moderate (HCC)  Chronic, stable  Continue follow up with behavioral healthy  7. Morbid (severe) obesity due to excess calories (HCC)  Chronic  Discussed healthy diet and regular exercise options   Encouraged to exercise at least 150 minutes per week with 2 days of strength training as tolerated  8. Encounter for screening colonoscopy  According to USPTF Colorectal cancer Screening guidelines. Colonoscopy is recommended every 10 years, starting at age 22years.  Will refer to GI for colon cancer screening. - Ambulatory referral to Gastroenterology  9. Arthralgia, unspecified joint  I will check for any autoimmune disorders  She is not wanting any additional medications other than her pain cream and tylenol - Sedimentation rate - Autoimmune Profile     Patient was given opportunity to ask questions. Patient verbalized understanding of the plan and was able to repeat key elements of the plan. All questions were answered to their satisfaction.   [name], FNP   I, [name], FNP, have reviewed all documentation for this visit. The documentation on 09/12/20 for the exam, diagnosis, procedures, and orders are all accurate and complete.   THE PATIENT IS ENCOURAGED TO PRACTICE SOCIAL DISTANCING DUE TO  THE COVID-19 PANDEMIC.

## 2020-09-12 NOTE — Patient Instructions (Addendum)
Health Maintenance, Female Adopting a healthy lifestyle and getting preventive care are important in promoting health and wellness. Ask your health care provider about:  The right schedule for you to have regular tests and exams.  Things you can do on your own to prevent diseases and keep yourself healthy. What should I know about diet, weight, and exercise? Eat a healthy diet  Eat a diet that includes plenty of vegetables, fruits, low-fat dairy products, and lean protein.  Do not eat a lot of foods that are high in solid fats, added sugars, or sodium.   Maintain a healthy weight Body mass index (BMI) is used to identify weight problems. It estimates body fat based on height and weight. Your health care provider can help determine your BMI and help you achieve or maintain a healthy weight. Get regular exercise Get regular exercise. This is one of the most important things you can do for your health. Most adults should:  Exercise for at least 150 minutes each week. The exercise should increase your heart rate and make you sweat (moderate-intensity exercise).  Do strengthening exercises at least twice a week. This is in addition to the moderate-intensity exercise.  Spend less time sitting. Even light physical activity can be beneficial. Watch cholesterol and blood lipids Have your blood tested for lipids and cholesterol at 70 years of age, then have this test every 5 years. Have your cholesterol levels checked more often if:  Your lipid or cholesterol levels are high.  You are older than 70 years of age.  You are at high risk for heart disease. What should I know about cancer screening? Depending on your health history and family history, you may need to have cancer screening at various ages. This may include screening for:  Breast cancer.  Cervical cancer.  Colorectal cancer.  Skin cancer.  Lung cancer. What should I know about heart disease, diabetes, and high blood  pressure? Blood pressure and heart disease  High blood pressure causes heart disease and increases the risk of stroke. This is more likely to develop in people who have high blood pressure readings, are of African descent, or are overweight.  Have your blood pressure checked: ? Every 3-5 years if you are 18-39 years of age. ? Every year if you are 40 years old or older. Diabetes Have regular diabetes screenings. This checks your fasting blood sugar level. Have the screening done:  Once every three years after age 40 if you are at a normal weight and have a low risk for diabetes.  More often and at a younger age if you are overweight or have a high risk for diabetes. What should I know about preventing infection? Hepatitis B If you have a higher risk for hepatitis B, you should be screened for this virus. Talk with your health care provider to find out if you are at risk for hepatitis B infection. Hepatitis C Testing is recommended for:  Everyone born from 1945 through 1965.  Anyone with known risk factors for hepatitis C. Sexually transmitted infections (STIs)  Get screened for STIs, including gonorrhea and chlamydia, if: ? You are sexually active and are younger than 70 years of age. ? You are older than 70 years of age and your health care provider tells you that you are at risk for this type of infection. ? Your sexual activity has changed since you were last screened, and you are at increased risk for chlamydia or gonorrhea. Ask your health care provider   if you are at risk.  Ask your health care provider about whether you are at high risk for HIV. Your health care provider may recommend a prescription medicine to help prevent HIV infection. If you choose to take medicine to prevent HIV, you should first get tested for HIV. You should then be tested every 3 months for as long as you are taking the medicine. Pregnancy  If you are about to stop having your period (premenopausal) and  you may become pregnant, seek counseling before you get pregnant.  Take 400 to 800 micrograms (mcg) of folic acid every day if you become pregnant.  Ask for birth control (contraception) if you want to prevent pregnancy. Osteoporosis and menopause Osteoporosis is a disease in which the bones lose minerals and strength with aging. This can result in bone fractures. If you are 65 years old or older, or if you are at risk for osteoporosis and fractures, ask your health care provider if you should:  Be screened for bone loss.  Take a calcium or vitamin D supplement to lower your risk of fractures.  Be given hormone replacement therapy (HRT) to treat symptoms of menopause. Follow these instructions at home: Lifestyle  Do not use any products that contain nicotine or tobacco, such as cigarettes, e-cigarettes, and chewing tobacco. If you need help quitting, ask your health care provider.  Do not use street drugs.  Do not share needles.  Ask your health care provider for help if you need support or information about quitting drugs. Alcohol use  Do not drink alcohol if: ? Your health care provider tells you not to drink. ? You are pregnant, may be pregnant, or are planning to become pregnant.  If you drink alcohol: ? Limit how much you use to 0-1 drink a day. ? Limit intake if you are breastfeeding.  Be aware of how much alcohol is in your drink. In the [place] one drink equals one 12 oz bottle of beer (355 mL), one 5 oz glass of wine (148 mL), or one 1 oz glass of hard liquor (44 mL). General instructions  Schedule regular health, dental, and eye exams.  Stay current with your vaccines.  Tell your health care provider if: ? You often feel depressed. ? You have ever been abused or do not feel safe at home. Summary  Adopting a healthy lifestyle and getting preventive care are important in promoting health and wellness.  Follow your health care provider's instructions about healthy  diet, exercising, and getting tested or screened for diseases.  Follow your health care provider's instructions on monitoring your cholesterol and blood pressure. This information is not intended to replace advice given to you by your health care provider. Make sure you discuss any questions you have with your health care provider. Document Revised: 06/23/2018 Document Reviewed: 06/23/2018 Elsevier Patient Education  2021 Elsevier Inc.  

## 2020-09-13 LAB — CMP14+EGFR
ALT: 27 IU/L (ref 0–32)
AST: 30 IU/L (ref 0–40)
Albumin/Globulin Ratio: 1.8 (ref 1.2–2.2)
Albumin: 4.3 g/dL (ref 3.8–4.8)
Alkaline Phosphatase: 56 IU/L (ref 44–121)
BUN/Creatinine Ratio: 18 (ref 12–28)
BUN: 40 mg/dL — ABNORMAL HIGH (ref 8–27)
Bilirubin Total: 0.3 mg/dL (ref 0.0–1.2)
CO2: 24 mmol/L (ref 20–29)
Calcium: 9.4 mg/dL (ref 8.7–10.3)
Chloride: 103 mmol/L (ref 96–106)
Creatinine, Ser: 2.26 mg/dL — ABNORMAL HIGH (ref 0.57–1.00)
Globulin, Total: 2.4 g/dL (ref 1.5–4.5)
Glucose: 80 mg/dL (ref 65–99)
Potassium: 5.5 mmol/L — ABNORMAL HIGH (ref 3.5–5.2)
Sodium: 141 mmol/L (ref 134–144)
Total Protein: 6.7 g/dL (ref 6.0–8.5)
eGFR: 23 mL/min/{1.73_m2} — ABNORMAL LOW (ref >59)

## 2020-09-13 LAB — AUTOIMMUNE PROFILE
Anti Nuclear Antibody (ANA): NEGATIVE
Complement C3, Serum: 122 mg/dL (ref 82–167)
dsDNA Ab: <1 IU/mL (ref 0–9)

## 2020-09-13 LAB — CBC
Hematocrit: 36.9 % (ref 34.0–46.6)
Hemoglobin: 11.7 g/dL (ref 11.1–15.9)
MCH: 29 pg (ref 26.6–33.0)
MCHC: 31.7 g/dL (ref 31.5–35.7)
MCV: 91 fL (ref 79–97)
Platelets: 298 10*3/uL (ref 150–450)
RBC: 4.04 x10E6/uL (ref 3.77–5.28)
RDW: 11.8 % (ref 11.7–15.4)
WBC: 8.1 10*3/uL (ref 3.4–10.8)

## 2020-09-13 LAB — LIPID PANEL
Chol/HDL Ratio: 3.1 ratio (ref 0.0–4.4)
Cholesterol, Total: 137 mg/dL (ref 100–199)
HDL: 44 mg/dL (ref >39)
LDL Chol Calc (NIH): 70 mg/dL (ref 0–99)
Triglycerides: 130 mg/dL (ref 0–149)
VLDL Cholesterol Cal: 23 mg/dL (ref 5–40)

## 2020-09-13 LAB — SEDIMENTATION RATE: Sed Rate: 2 mm/hr (ref 0–40)

## 2020-09-13 LAB — VITAMIN D 25 HYDROXY (VIT D DEFICIENCY, FRACTURES): Vit D, 25-Hydroxy: 33.4 ng/mL (ref 30.0–100.0)

## 2020-09-13 LAB — HEMOGLOBIN A1C
Est. average glucose Bld gHb Est-mCnc: 100 mg/dL
Hgb A1c MFr Bld: 5.1 % (ref 4.8–5.6)

## 2020-09-24 ENCOUNTER — Encounter: Payer: Self-pay | Admitting: Nurse Practitioner

## 2020-09-24 ENCOUNTER — Other Ambulatory Visit: Payer: Self-pay

## 2020-09-24 ENCOUNTER — Telehealth: Payer: Self-pay | Admitting: Licensed Clinical Social Worker

## 2020-09-24 ENCOUNTER — Ambulatory Visit (INDEPENDENT_AMBULATORY_CARE_PROVIDER_SITE_OTHER): Payer: Medicare (Managed Care) | Admitting: Internal Medicine

## 2020-09-24 ENCOUNTER — Encounter: Payer: Self-pay | Admitting: Internal Medicine

## 2020-09-24 VITALS — BP 142/80 | HR 89 | Ht 63.0 in | Wt 263.0 lb

## 2020-09-24 DIAGNOSIS — G4733 Obstructive sleep apnea (adult) (pediatric): Secondary | ICD-10-CM

## 2020-09-24 DIAGNOSIS — E119 Type 2 diabetes mellitus without complications: Secondary | ICD-10-CM

## 2020-09-24 DIAGNOSIS — N184 Chronic kidney disease, stage 4 (severe): Secondary | ICD-10-CM

## 2020-09-24 DIAGNOSIS — E785 Hyperlipidemia, unspecified: Secondary | ICD-10-CM | POA: Diagnosis not present

## 2020-09-24 DIAGNOSIS — I10 Essential (primary) hypertension: Secondary | ICD-10-CM

## 2020-09-24 NOTE — Progress Notes (Signed)
Heart and Vascular Care Navigation  09/24/2020  Kimberly Gross May 24, 1951 [id number]  Reason for Referral:  Past due utility bills                                                                                                  Assessment:          LCSW was finally able to reach pt at [phone]. Introduced self, role, reason for call. I let pt know that I previously had not been able to leave a message due to her full voicemail. Pt confirmed home address, PCP, and emergency contacts. She lives w/ her adult daughter Kimberly Gross and her four grandchildren. Previous contact w/ LCSW team and was provided w/ bunk beds. 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 She does not currently receive SNAP as she was told she makes too much but her daughter receives food assistance. Her main concerns revolve around her multiple past due utility bills and loans. Pt husband passed about a year ago and she took on his past due debts as well. She currently has a past due gas, power, and water bill. She has received notice that her gas may be shut off.   I inquired if she had applied for LIEAP/LIWAP or any other utility assistance programs. Pt denies having applied for any additional assistance at this time. She is open to assistance. I have spoken w/ team lead Kimberly Gross and she confirms we will be able to assist pt with preventing her gas turn off if able. Unfortunately pt unable to locate gas bill while speaking w/ this Probation officer for me to call. She will look for it and then be in touch with this Probation officer. I also discussed other community referrals- pt open to assistance including [employer] for Housing and [employer] utility application assistance program. I will start [place] applications and mail to patient along w/ information about how to apply w/ DSS.                     HRT/VAS Care Coordination    Patients Home Cardiology Office [place] Team Social Worker   Social Worker Name: Kimberly Gross [place], [phone]   Living arrangements for the past 2 months Single Family Home   Lives with: Adult Children; Relatives  4 grandchildren   Patient Current Insurance Coverage Managed Medicare   Patient Has Concern With Paying Medical Bills No   Does Patient Have Prescription Coverage? Yes   Home Assistive Devices/Equipment Walker (specify type)      Social History:                                                                             SDOH Screenings  Alcohol Screen: Not on file  Depression (PHQ2-9): Low Risk   . PHQ-2 Score: 0  Financial Resource Strain: High Risk  . Difficulty of Paying Living Expenses: Very hard  Food Insecurity: No Food Insecurity  . Worried About Charity fundraiser in the Last Year: Never true  . Ran Out of Food in the Last Year: Never true  Housing: Medium Risk  . Last Housing Risk Score: 1  Physical Activity: Not on file  Social Connections: Not on file  Stress: Not on file  Tobacco Use: Low Risk   . Smoking Tobacco Use: Never Smoker  . Smokeless Tobacco Use: Never Used  Transportation Needs: Unmet Transportation Needs  . Lack of Transportation (Medical): Yes  . Lack of Transportation (Non-Medical): Yes    SDOH Interventions: Financial Resources:  Financial Strain Interventions: Other (Comment),[id number] Referral [employer] for [facility] and Community Studies; LIEAP/LIWAP)   Food Insecurity:  Food Insecurity Interventions: Intervention Not Indicated  Housing Insecurity:  Housing Interventions: [id number] Referral,Other (Comment) ([place] for [employer] and [employer], Patient [place])  Transportation:   Transportation Interventions: Financial planner   Follow-up plan:   Referrals were made securely via [place] to [employer] and [employer] and [employer] for assistance and financial  counseling. LCSW will f/u with pt tomorrow to see if bill located for [place] to assist w/ Patient Care Funds. Pt has my name and number for any additional questions/concerns before that time.

## 2020-09-24 NOTE — Progress Notes (Signed)
Cardiology Office Note:    Date:  09/24/2020   ID:  [name], DOB [birth year], MRN [id number]  PCP:  [name], FNP  Cardiologist:  Elouise Munroe, MD  Electrophysiologist:  None   Referring MD: [name], FNP   Chief Complaint/Reason for Referral: HTN  History of Present Illness:    Kimberly Gross is a 70 y.o. female with a history of hypertension, diabetes, chronic kidney disease stage IV, bipolar 1 disorder. Follow up today for HTN.   Hyperkalemia and chronic renal dysfunction on lab work. Follows with nephrology. BP and lifestyle have improved and she has made great progress. The patient denies chest pain, chest pressure, dyspnea at rest or with exertion, palpitations, PND, orthopnea, or leg swelling. Denies cough, fever, chills. Denies nausea, vomiting. Denies syncope or presyncope. Denies dizziness or lightheadedness.   Past Medical History:  Diagnosis Date   Anemia    Anxiety    Bipolar 1 disorder ([place])    CKD (chronic kidney disease), stage IV (HCC)    Depression    Diabetes mellitus without complication ([place])    Hyperlipemia    Hypertension    Occasional tremors     Past Surgical History:  Procedure Laterality Date   VAGINAL HYSTERECTOMY      Current Medications: Current Meds  Medication Sig   ACCU-CHEK AVIVA PLUS test strip TEST BLOOD SUGAR TWICE DAILY   Accu-Chek Softclix Lancets lancets USE AS DIRECTED  TO CHECK BLOOD GLUCOSE   acetaminophen (TYLENOL) 500 MG tablet Take 1,000 mg by mouth 2 (two) times a day.   Alcohol Swabs (B-D SINGLE USE SWABS REGULAR) PADS Use as directed to check blood sugars twice daily   amLODipine (NORVASC) 10 MG tablet Take 1 tablet (10 mg total) by mouth daily.   aspirin 81 MG tablet Take 81 mg by mouth daily.    atorvastatin (LIPITOR) 40 MG tablet TAKE 1 TABLET BY MOUTH EVERYDAY AT BEDTIME   BIOTIN PO Take 1 tablet by mouth daily at 12 noon.   Blood Glucose Monitoring Suppl (ACCU-CHEK AVIVA PLUS) w/Device KIT  Check blood sugar twice a day   cholecalciferol (VITAMIN D3) 25 MCG (1000 UNIT) tablet Take 1,000 Units by mouth daily.   clonazePAM (KLONOPIN) 0.5 MG tablet Take 1 tablet (0.5 mg total) by mouth 2 (two) times daily.   divalproex (DEPAKOTE) 500 MG DR tablet 500 mg. 2 pills 2 times per day   DM-APAP-CPM (CORICIDIN HBP PO) Take by mouth as needed.   fenofibrate 160 MG tablet TAKE 1 TABLET BY MOUTH EVERY DAY   ferrous sulfate 325 (65 FE) MG tablet Take 325 mg by mouth daily with breakfast.   furosemide (LASIX) 40 MG tablet TAKE 1 TABLET ON MONDAY, WEDNESDAY AND FRIDAY   hydrALAZINE (APRESOLINE) 100 MG tablet Take 1 tablet (100 mg total) by mouth 3 (three) times daily.   Insulin Degludec-Liraglutide (XULTOPHY) 100-3.6 UNIT-MG/ML SOPN Inject 30 Units into the skin daily.   Insulin Pen Needle (DROPLET PEN NEEDLES) 32G X 4 MM MISC USE WITH XULTOPHY ONE TIME DAILY.   Multiple Vitamins-Minerals (MULTIVITAMIN ADULT PO) Use as directed 1 tablet in the mouth or throat daily.   traZODone (DESYREL) 50 MG tablet    vitamin B-12 (CYANOCOBALAMIN) 250 MCG tablet Take 250 mcg by mouth daily.     Allergies:   Chlorpromazine   Social History   Tobacco Use   Smoking status: Never Smoker   Smokeless tobacco: Never Used  Vaping Use   Vaping Use:  Never used  Substance Use Topics   Alcohol use: No   Drug use: No     Family History: The patient's family history includes Heart attack in her father; Hypertension in her mother. There is no history of Breast cancer.  ROS:   Please see the history of present illness.    All other systems reviewed and are negative.  EKGs/Labs/Other Studies Reviewed:    The following studies were reviewed today:  EKG:  SR with first deg AVB, RBBB and LAFB  Recent Labs: 09/12/2020: ALT 27; BUN 40; Creatinine, Ser 2.26; Hemoglobin 11.7; Platelets 298; Potassium 5.5; Sodium 141  Recent Lipid Panel    Component Value Date/Time   CHOL 137 09/12/2020 1320   TRIG 130  09/12/2020 1320   HDL 44 09/12/2020 1320   CHOLHDL 3.1 09/12/2020 1320   CHOLHDL 2.7 08/23/2010 2238   VLDL 12 08/23/2010 2238   LDLCALC 70 09/12/2020 1320    Physical Exam:    VS:  BP (!) 142/80    Pulse 89    Ht 5' 3"  (1.6 m)    Wt 263 lb (119.3 kg)    SpO2 98%    BMI 46.59 kg/m     Wt Readings from Last 5 Encounters:  09/24/20 263 lb (119.3 kg)  09/12/20 260 lb (117.9 kg)  08/09/20 286 lb (129.7 kg)  07/26/20 278 lb 9.6 oz (126.4 kg)  05/22/20 278 lb 3.2 oz (126.2 kg)    Constitutional: No acute distress Eyes: sclera non-icteric, normal conjunctiva and lids ENMT: normal dentition, moist mucous membranes Cardiovascular: regular rhythm, normal rate, no murmurs. S1 and S2 normal. Radial pulses normal bilaterally. No jugular venous distention.  Respiratory: clear to auscultation bilaterally GI : normal bowel sounds, soft and nontender. No distention.   MSK: extremities warm, well perfused. No edema.  NEURO: grossly nonfocal exam, moves all extremities. PSYCH: alert and oriented x 3, normal mood and affect.   ASSESSMENT:    1. Essential hypertension   2. Hyperlipidemia, unspecified hyperlipidemia type   3. OSA (obstructive sleep apnea)   4. Type 2 diabetes mellitus without complication, without long-term current use of insulin ([place])   5. CKD (chronic kidney disease) stage 4, GFR 15-29 ml/min (HCC)    PLAN:    Essential hypertension - Plan: EKG 12-Lead - BP has improved, continue current regimen of amlodipine, carvedilol, furosemide, hydralazine.  Hyperlipidemia, unspecified hyperlipidemia type - lipids greatly improved, LDL 70, Trig 130 down from 228. Continue Atorvastatin 40 mg daily.   Total time of encounter: 20 minutes total time of encounter, including 15 minutes spent in face-to-face patient care on the date of this encounter. This time includes coordination of care and counseling regarding above mentioned problem list. Remainder of non-face-to-face time involved  reviewing chart documents/testing relevant to the patient encounter and documentation in the medical record. I have independently reviewed documentation from referring provider.   Cherlynn Kaiser, MD, [facility]    Medication Adjustments/Labs and Tests Ordered: Current medicines are reviewed at length with the patient today.  Concerns regarding medicines are outlined above.   Orders Placed This Encounter  Procedures   EKG 12-Lead     No orders of the defined types were placed in this encounter.   Patient Instructions  Medication Instructions:  No Changes In Medications at this time.  *If you need a refill on your cardiac medications before your next appointment, please call your pharmacy*  Follow-Up: At [facility], you and  your health needs are our priority.  As part of our continuing mission to provide you with exceptional heart care, we have created designated Provider Care Teams.  These Care Teams include your primary Cardiologist (physician) and Advanced Practice Providers (APPs -  Physician Assistants and Nurse Practitioners) who all work together to provide you with the care you need, when you need it.  We recommend signing up for the patient portal called "MyChart".  Sign up information is provided on this After Visit Summary.  MyChart is used to connect with patients for Virtual Visits (Telemedicine).  Patients are able to view lab/test results, encounter notes, upcoming appointments, etc.  Non-urgent messages can be sent to your provider as well.   To learn more about what you can do with MyChart, go to [email or web address].    Your next appointment:   6 month(s)  The format for your next appointment:   In Person  Provider:   Cherlynn Kaiser, MD  Other Instructions [place] in [place].

## 2020-09-24 NOTE — Telephone Encounter (Signed)
LCSW attempted again to reach pt. Called and unable to reach pt, unable to leave message due to voicemail being full. I will try one more time in the morning and then send text message through advising of my attempts to reach pt.   Westley Hummer, MSW, [place]  [phone]

## 2020-09-24 NOTE — Patient Instructions (Signed)
Medication Instructions:  No Changes In Medications at this time.  *If you need a refill on your cardiac medications before your next appointment, please call your pharmacy*  Follow-Up: At [facility], you and your health needs are our priority.  As part of our continuing mission to provide you with exceptional heart care, we have created designated Provider Care Teams.  These Care Teams include your primary Cardiologist (physician) and Advanced Practice Providers (APPs -  Physician Assistants and Nurse Practitioners) who all work together to provide you with the care you need, when you need it.  We recommend signing up for the patient portal called "MyChart".  Sign up information is provided on this After Visit Summary.  MyChart is used to connect with patients for Virtual Visits (Telemedicine).  Patients are able to view lab/test results, encounter notes, upcoming appointments, etc.  Non-urgent messages can be sent to your provider as well.   To learn more about what you can do with MyChart, go to [email or web address].    Your next appointment:   6 month(s)  The format for your next appointment:   In Person  Provider:   Cherlynn Kaiser, MD  Other Instructions [place] in [place].

## 2020-09-24 NOTE — Telephone Encounter (Signed)
Attempted to reach pt to discuss past due gas bill. Utilized number confirmed pt CMA Kimberly Gross [phone]). No answer and unable to leave message as voicemail is full. Will re-attempt later today.   Westley Hummer, MSW, [place]  [phone]

## 2020-09-25 ENCOUNTER — Telehealth: Payer: Self-pay | Admitting: Licensed Clinical Social Worker

## 2020-09-25 NOTE — Telephone Encounter (Signed)
Called to f/u on gas bill. Pt shares her gas is still working today. She had found the bill but does not have it available. I requested she call me once she has found the bill to provide me with the account number to see if there is anything that we can do to assist with making payment. Await call back.   Westley Hummer, MSW, [place]  [phone]

## 2020-09-27 ENCOUNTER — Telehealth: Payer: Self-pay | Admitting: *Deleted

## 2020-09-27 ENCOUNTER — Telehealth: Payer: Self-pay | Admitting: Licensed Clinical Social Worker

## 2020-09-27 ENCOUNTER — Ambulatory Visit (INDEPENDENT_AMBULATORY_CARE_PROVIDER_SITE_OTHER): Payer: Medicare (Managed Care)

## 2020-09-27 ENCOUNTER — Other Ambulatory Visit: Payer: Medicare (Managed Care)

## 2020-09-27 VITALS — Ht 63.0 in | Wt 262.0 lb

## 2020-09-27 DIAGNOSIS — Z0000 Encounter for general adult medical examination without abnormal findings: Secondary | ICD-10-CM | POA: Diagnosis not present

## 2020-09-27 DIAGNOSIS — Z1211 Encounter for screening for malignant neoplasm of colon: Secondary | ICD-10-CM | POA: Diagnosis not present

## 2020-09-27 DIAGNOSIS — E875 Hyperkalemia: Secondary | ICD-10-CM

## 2020-09-27 NOTE — Telephone Encounter (Signed)
LCSW has not heard back from pt, additional HIPAA compliant message able to be left today at [phone]. Of note pt did decline assistance from [facility] for [facility] and [employer] and gas was still working on 3/16 despite questions about turn off.   Westley Hummer, MSW, [place]  [phone]

## 2020-09-27 NOTE — Patient Instructions (Signed)
Kimberly Gross , Thank you for taking time to come for your Medicare Wellness Visit. I appreciate your ongoing commitment to your health goals. Please review the following plan we discussed and let me know if I can assist you in the future.   Screening recommendations/referrals: Colonoscopy: ordered today Mammogram: completed 03/12/2020 Bone Density: completed 02/15/2018 Recommended yearly ophthalmology/optometry visit for glaucoma screening and checkup Recommended yearly dental visit for hygiene and checkup  Vaccinations: Influenza vaccine: completed 06/13/2020, due 02/11/2021 Pneumococcal vaccine: completed 03/12/2020 Tdap vaccine: completed 01/18/2015, due 01/17/2025 Shingles vaccine: needs second dose   Covid-19: 07/25/2019, 03/12/2020, 10/24/2019  Advanced directives: Advance directive discussed with you today.   Conditions/risks identified: none  Next appointment: Follow up in one year for your annual wellness visit    Preventive Care 65 Years and Older, Female Preventive care refers to lifestyle choices and visits with your health care provider that can promote health and wellness. What does preventive care include?  A yearly physical exam. This is also called an annual well check.  Dental exams once or twice a year.  Routine eye exams. Ask your health care provider how often you should have your eyes checked.  Personal lifestyle choices, including:  Daily care of your teeth and gums.  Regular physical activity.  Eating a healthy diet.  Avoiding tobacco and drug use.  Limiting alcohol use.  Practicing safe sex.  Taking low-dose aspirin every day.  Taking vitamin and mineral supplements as recommended by your health care provider. What happens during an annual well check? The services and screenings done by your health care provider during your annual well check will depend on your age, overall health, lifestyle risk factors, and family history of disease. Counseling  Your  health care provider may ask you questions about your:  Alcohol use.  Tobacco use.  Drug use.  Emotional well-being.  Home and relationship well-being.  Sexual activity.  Eating habits.  History of falls.  Memory and ability to understand (cognition).  Work and work Statistician.  Reproductive health. Screening  You may have the following tests or measurements:  Height, weight, and BMI.  Blood pressure.  Lipid and cholesterol levels. These may be checked every 5 years, or more frequently if you are over 76 years old.  Skin check.  Lung cancer screening. You may have this screening every year starting at age 17 if you have a 30-pack-year history of smoking and currently smoke or have quit within the past 15 years.  Fecal occult blood test (FOBT) of the stool. You may have this test every year starting at age 22.  Flexible sigmoidoscopy or colonoscopy. You may have a sigmoidoscopy every 5 years or a colonoscopy every 10 years starting at age 18.  Hepatitis C blood test.  Hepatitis B blood test.  Sexually transmitted disease (STD) testing.  Diabetes screening. This is done by checking your blood sugar (glucose) after you have not eaten for a while (fasting). You may have this done every 1-3 years.  Bone density scan. This is done to screen for osteoporosis. You may have this done starting at age 28.  Mammogram. This may be done every 1-2 years. Talk to your health care provider about how often you should have regular mammograms. Talk with your health care provider about your test results, treatment options, and if necessary, the need for more tests. Vaccines  Your health care provider may recommend certain vaccines, such as:  Influenza vaccine. This is recommended every year.  Tetanus, diphtheria,  and acellular pertussis (Tdap, Td) vaccine. You may need a Td booster every 10 years.  Zoster vaccine. You may need this after age 68.  Pneumococcal 13-valent  conjugate (PCV13) vaccine. One dose is recommended after age 76.  Pneumococcal polysaccharide (PPSV23) vaccine. One dose is recommended after age 74. Talk to your health care provider about which screenings and vaccines you need and how often you need them. This information is not intended to replace advice given to you by your health care provider. Make sure you discuss any questions you have with your health care provider. Document Released: 07/27/2015 Document Revised: 03/19/2016 Document Reviewed: 05/01/2015 Elsevier Interactive Patient Education  2017 [place] Prevention in the Home Falls can cause injuries. They can happen to people of all ages. There are many things you can do to make your home safe and to help prevent falls. What can I do on the outside of my home?  Regularly fix the edges of walkways and driveways and fix any cracks.  Remove anything that might make you trip as you walk through a door, such as a raised step or threshold.  Trim any bushes or trees on the path to your home.  Use bright outdoor lighting.  Clear any walking paths of anything that might make someone trip, such as rocks or tools.  Regularly check to see if handrails are loose or broken. Make sure that both sides of any steps have handrails.  Any raised decks and porches should have guardrails on the edges.  Have any leaves, snow, or ice cleared regularly.  Use sand or salt on walking paths during winter.  Clean up any spills in your garage right away. This includes oil or grease spills. What can I do in the bathroom?  Use night lights.  Install grab bars by the toilet and in the tub and shower. Do not use towel bars as grab bars.  Use non-skid mats or decals in the tub or shower.  If you need to sit down in the shower, use a plastic, non-slip stool.  Keep the floor dry. Clean up any water that spills on the floor as soon as it happens.  Remove soap buildup in the tub or  shower regularly.  Attach bath mats securely with double-sided non-slip rug tape.  Do not have throw rugs and other things on the floor that can make you trip. What can I do in the bedroom?  Use night lights.  Make sure that you have a light by your bed that is easy to reach.  Do not use any sheets or blankets that are too big for your bed. They should not hang down onto the floor.  Have a firm chair that has side arms. You can use this for support while you get dressed.  Do not have throw rugs and other things on the floor that can make you trip. What can I do in the kitchen?  Clean up any spills right away.  Avoid walking on wet floors.  Keep items that you use a lot in easy-to-reach places.  If you need to reach something above you, use a strong step stool that has a grab bar.  Keep electrical cords out of the way.  Do not use floor polish or wax that makes floors slippery. If you must use wax, use non-skid floor wax.  Do not have throw rugs and other things on the floor that can make you trip. What can I do with my  stairs?  Do not leave any items on the stairs.  Make sure that there are handrails on both sides of the stairs and use them. Fix handrails that are broken or loose. Make sure that handrails are as long as the stairways.  Check any carpeting to make sure that it is firmly attached to the stairs. Fix any carpet that is loose or worn.  Avoid having throw rugs at the top or bottom of the stairs. If you do have throw rugs, attach them to the floor with carpet tape.  Make sure that you have a light switch at the top of the stairs and the bottom of the stairs. If you do not have them, ask someone to add them for you. What else can I do to help prevent falls?  Wear shoes that:  Do not have high heels.  Have rubber bottoms.  Are comfortable and fit you well.  Are closed at the toe. Do not wear sandals.  If you use a stepladder:  Make sure that it is fully  opened. Do not climb a closed stepladder.  Make sure that both sides of the stepladder are locked into place.  Ask someone to hold it for you, if possible.  Clearly mark and make sure that you can see:  Any grab bars or handrails.  First and last steps.  Where the edge of each step is.  Use tools that help you move around (mobility aids) if they are needed. These include:  Canes.  Walkers.  Scooters.  Crutches.  Turn on the lights when you go into a dark area. Replace any light bulbs as soon as they burn out.  Set up your furniture so you have a clear path. Avoid moving your furniture around.  If any of your floors are uneven, fix them.  If there are any pets around you, be aware of where they are.  Review your medicines with your doctor. Some medicines can make you feel dizzy. This can increase your chance of falling. Ask your doctor what other things that you can do to help prevent falls. This information is not intended to replace advice given to you by your health care provider. Make sure you discuss any questions you have with your health care provider. Document Released: 04/26/2009 Document Revised: 12/06/2015 Document Reviewed: 08/04/2014 Elsevier Interactive Patient Education  2017 [employer].

## 2020-09-27 NOTE — Progress Notes (Signed)
I connected with Kimberly Gross today by telephone and verified that I am speaking with the correct person using two identifiers. Location patient: home Location provider: work Persons participating in the virtual visit: Nieshia Rybolt,   LPN.   I discussed the limitations, risks, security and privacy concerns of performing an evaluation and management service by telephone and the availability of in person appointments. I also discussed with the patient that there may be a patient responsible charge related to this service. The patient expressed understanding and verbally consented to this telephonic visit.    Interactive audio and video telecommunications were attempted between this provider and patient, however failed, due to patient having technical difficulties OR patient did not have access to video capability.  We continued and completed visit with audio only.     Vital signs may be patient reported or missing.  Subjective:   Kimberly Gross is a 70 y.o. female who presents for Medicare Annual (Subsequent) preventive examination.  Review of Systems     Cardiac Risk Factors include: advanced age (>55men, >65 women);diabetes mellitus;dyslipidemia;hypertension;obesity (BMI >30kg/m2);sedentary lifestyle     Objective:    Today's Vitals   09/27/20 0844 09/27/20 0845  Weight: 262 lb (118.8 kg)   Height: 5' 3" (1.6 m)   PainSc:  8    Body mass index is 46.41 kg/m.  Advanced Directives 09/27/2020 09/07/2019 09/01/2018 02/27/2016  Does Patient Have a Medical Advance Directive? No No No Yes  Does patient want to make changes to medical advance directive? - - - No - Patient declined  Would patient like information on creating a medical advance directive? - - No - Patient declined -    Current Medications (verified) Outpatient Encounter Medications as of 09/27/2020  Medication Sig  . ACCU-CHEK AVIVA PLUS test strip TEST BLOOD SUGAR TWICE DAILY  . Accu-Chek Softclix Lancets  lancets USE AS DIRECTED  TO CHECK BLOOD GLUCOSE  . acetaminophen (TYLENOL) 500 MG tablet Take 1,000 mg by mouth 2 (two) times a day.  . Alcohol Swabs (B-D SINGLE USE SWABS REGULAR) PADS Use as directed to check blood sugars twice daily  . amLODipine (NORVASC) 10 MG tablet Take 1 tablet (10 mg total) by mouth daily.  . aspirin 81 MG tablet Take 81 mg by mouth daily.   . atorvastatin (LIPITOR) 40 MG tablet TAKE 1 TABLET BY MOUTH EVERYDAY AT BEDTIME  . BIOTIN PO Take 1 tablet by mouth daily at 12 noon.  . Blood Glucose Monitoring Suppl (ACCU-CHEK AVIVA PLUS) w/Device KIT Check blood sugar twice a day  . cholecalciferol (VITAMIN D3) 25 MCG (1000 UNIT) tablet Take 1,000 Units by mouth daily.  . clonazePAM (KLONOPIN) 0.5 MG tablet Take 1 tablet (0.5 mg total) by mouth 2 (two) times daily.  . divalproex (DEPAKOTE) 500 MG DR tablet 500 mg. 2 pills 2 times per day  . DM-APAP-CPM (CORICIDIN HBP PO) Take by mouth as needed.  . fenofibrate 160 MG tablet TAKE 1 TABLET BY MOUTH EVERY DAY  . ferrous sulfate 325 (65 FE) MG tablet Take 325 mg by mouth daily with breakfast.  . furosemide (LASIX) 40 MG tablet TAKE 1 TABLET ON MONDAY, WEDNESDAY AND FRIDAY  . hydrALAZINE (APRESOLINE) 100 MG tablet Take 1 tablet (100 mg total) by mouth 3 (three) times daily.  . Insulin Degludec-Liraglutide (XULTOPHY) 100-3.6 UNIT-MG/ML SOPN Inject 30 Units into the skin daily.  . Insulin Pen Needle (DROPLET PEN NEEDLES) 32G X 4 MM MISC USE WITH XULTOPHY ONE TIME DAILY.  .   Multiple Vitamins-Minerals (MULTIVITAMIN ADULT PO) Use as directed 1 tablet in the mouth or throat daily.  . traZODone (DESYREL) 50 MG tablet   . vitamin B-12 (CYANOCOBALAMIN) 250 MCG tablet Take 250 mcg by mouth daily.  . carvedilol (COREG) 25 MG tablet Take 1 tablet (25 mg total) by mouth 2 (two) times daily.   No facility-administered encounter medications on file as of 09/27/2020.    Allergies (verified) Chlorpromazine   History: Past Medical History:   Diagnosis Date  . Anemia   . Anxiety   . Bipolar 1 disorder (HCC)   . CKD (chronic kidney disease), stage IV (HCC)   . Depression   . Diabetes mellitus without complication (HCC)   . Hyperlipemia   . Hypertension   . Occasional tremors    Past Surgical History:  Procedure Laterality Date  . CATARACT EXTRACTION, BILATERAL  2021  . VAGINAL HYSTERECTOMY     Family History  Problem Relation Age of Onset  . Hypertension Mother   . Heart attack Father   . Breast cancer Neg Hx    Social History   Socioeconomic History  . Marital status: Widowed    Spouse name: Not on file  . Number of children: 2  . Years of education: Not on file  . Highest education level: Not on file  Occupational History  . Occupation: retired  Tobacco Use  . Smoking status: Never Smoker  . Smokeless tobacco: Never Used  Vaping Use  . Vaping Use: Never used  Substance and Sexual Activity  . Alcohol use: No  . Drug use: No  . Sexual activity: Not Currently  Other Topics Concern  . Not on file  Social History Narrative   Drinks 1-2 caffeine drinks a day    Her daughter & 4 grandchildren are living with her   Social Determinants of Health   Financial Resource Strain: High Risk  . Difficulty of Paying Living Expenses: Hard  Food Insecurity: No Food Insecurity  . Worried About Running Out of Food in the Last Year: Never true  . Ran Out of Food in the Last Year: Never true  Transportation Needs: No Transportation Needs  . Lack of Transportation (Medical): No  . Lack of Transportation (Non-Medical): No  Physical Activity: Inactive  . Days of Exercise per Week: 0 days  . Minutes of Exercise per Session: 0 min  Stress: No Stress Concern Present  . Feeling of Stress : Not at all  Social Connections: Not on file    Tobacco Counseling Counseling given: Not Answered   Clinical Intake:  Pre-visit preparation completed: Yes  Pain : 0-10 Pain Score: 8  Pain Type: Chronic pain Pain Location:  Generalized Pain Descriptors / Indicators: Aching Pain Frequency: Constant     Nutritional Status: BMI > 30  Obese Nutritional Risks: None Diabetes: Yes  How often do you need to have someone help you when you read instructions, pamphlets, or other written materials from your doctor or pharmacy?: 1 - Never What is the last grade level you completed in school?: 12th grade  Diabetic? Yes . Nutrition Risk Assessment:  Has the patient had any N/V/D within the last 2 months?  No  Does the patient have any non-healing wounds?  No  Has the patient had any unintentional weight loss or weight gain?  No   Diabetes:  Is the patient diabetic?  Yes  If diabetic, was a CBG obtained today?  No  Did the patient bring in their glucometer from   home?  No  How often do you monitor your CBG's? daily.   Financial Strains and Diabetes Management:  Are you having any financial strains with the device, your supplies or your medication? No .  Does the patient want to be seen by Chronic Care Management for management of their diabetes?  No  Would the patient like to be referred to a Nutritionist or for Diabetic Management?  No   Diabetic Exams:  Diabetic Eye Exam: Completed 09/05/2020 Diabetic Foot Exam: Completed 09/12/2020   Interpreter Needed?: No  Information entered by :: NAllen LPN   Activities of Daily Living In your present state of health, do you have any difficulty performing the following activities: 09/27/2020 09/12/2020  Hearing? N N  Vision? N Y  Difficulty concentrating or making decisions? N N  Walking or climbing stairs? Y Y  Dressing or bathing? N N  Doing errands, shopping? Y Y  Comment does not drive Facilities manager and eating ? N -  Using the Toilet? N -  In the past six months, have you accidently leaked urine? Y -  Comment wears depends -  Do you have problems with loss of bowel control? N -  Managing your Medications? N -  Managing your Finances? N -  Housekeeping  or managing your Housekeeping? N -  Some recent data might be hidden    Patient Care Team: [name], FNP as PCP - General ([place]) Elouise Munroe, MD as PCP - Cardiology (Cardiology) Norma Fredrickson, MD as Consulting Physician (Psychiatry) Rex Kras, Claudette Stapler, RN as Case Manager  Indicate any recent Medical Services you may have received from other than [facility] providers in the past year (date may be approximate).     Assessment:   This is a routine wellness examination for Ozzie.  Hearing/Vision screen  Hearing Screening   125Hz 250Hz 500Hz 1000Hz 2000Hz 3000Hz 4000Hz 6000Hz 8000Hz  Right ear:           Left ear:           Vision Screening Comments: Regular eye exams, Dr. Katy Fitch  Dietary issues and exercise activities discussed: Current Exercise Habits: The patient does not participate in regular exercise at present  Goals    .  "I check my blood sugar twice a day" (pt-stated)      CARE PLAN ENTRY (see longitudinal plan of care for additional care plan information)  Current Barriers:  Marland Kitchen Knowledge Deficits related to disease process and Self Health management of type 2 Diabetes  . Chronic Disease Management support and education needs related to Type 2 diabetes mellitus, Essential hypertension, Mixed Hyperlipidemia, Decreased strength of lower extremity, stage 4 chronic kidney disease   Nurse Case Manager Clinical Goal(s):  Marland Kitchen Over the next 90 days, patient will work with the CCM team and PCP to address needs related to disease education and support for improved Self Health management of type 2 Diabetes   CCM RN CM Interventions:  06/14/20 call completed with patient  . Inter-disciplinary care team collaboration (see longitudinal plan of care) . Evaluation of current treatment plan related to Diabetes and patient's adherence to plan as established by provider. . Provided education to patient re: current A1c is up to 8.3 from 6.5; Educated on daily glycemic control  FBS 80-130, <180 after meals; Educated on 15'15' rule; Educated on dietary and exercise recommendations  . Reviewed medications with patient and discussed patient is adhering to her prescribed DM treatment plan; Determined patient is  receiving PAP assistance for Xultophy and continues to be engaged with the embedded Pharm D; Current regimen:  o  o Insulin Degludec-Liraglutide (XULTOPHY) 100-3.6 UNIT-MG/ML SOPN o Inject 30 Units into the skin daily.   . Provided patient with printed educational materials related to Diabetes Meal Planning; Diabetes Zone Safety Tool; Grocery Shopping with Diabetes; Diabetes Care Schedule; Preventing Complications from Diabetes . Advised patient, providing education and rationale, to check cbg before meals 1-2 times daily and record, calling the CCM team and or PCP for findings outside established parameters . Discussed plans with patient for ongoing care management follow up and provided patient with direct contact information for care management team  Patient Self Care Activities:  . Continue to Self administer medications as prescribed . Attend all scheduled provider appointments . Call pharmacy for medication refills . Call provider office for new concerns or questions . Follow dietary and exercise recommendations . Continue to monitor CBG 1-2 times daily before meals and record  Please see past updates related to this goal by clicking on the "Past Updates" button in the selected goal      .  "I have pain everyday from my arthritis" (pt-stated)      CARE PLAN ENTRY (see longitudinal plan of care for additional care plan information)  Current Barriers:  Marland Kitchen Knowledge Deficits related to evaluation and treatment of chronic pain and decreased strength of lower extremity  . Chronic Disease Management support and education needs related to Type 2 diabetes mellitus, Essential hypertension, Mixed Hyperlipidemia, Decreased strength of lower extremity, stage 4 chronic  kidney disease   Nurse Case Manager Clinical Goal(s):  Marland Kitchen Over the next 90 days, patient will work with the CCM team and PCP  to address needs related to evaluate and treat chronic pain and decreased strength to lower extremities   CCM RN CM Interventions:  06/14/20 call completed with patient  . Inter-disciplinary care team collaboration (see longitudinal plan of care) . Evaluation of current treatment plan related to chronic pain and decreased strength to lower extremities and patient's adherence to plan as established by provider. . Provided education to patient re: signs and symptoms suggestive of arthritis; Educated on dietary and exercise recommendations to help decrease inflammation and promote muscle strength and endurance . Reviewed medications with patient and discussed patient is taking Acetaminophen 500 mg, 2 tablets twice daily for pain  . Discussed patient would like to be further evaluated for Osteoarthritis and or Rheumatoid Arthritis, she is not currently established with a Rheumatologist . Determined patient has used PT in the past for treatment of muscle weakness with good effectiveness  . Determined patient has a walker that she uses at all times with ambulation to help with balance and fall prevention, patient denies recent falls  . Discussed next scheduled PCP follow up appointment scheduled for 09/12/20 _0 :30 PM with [name] FNP  . Discussed plans with patient for ongoing care management follow up and provided patient with direct contact information for care management team  Patient Self Care Activities:  . Continue to self administers medications as prescribed . Attend all scheduled provider appointments . Call pharmacy for medication refills . Call provider office for new concerns or questions . Continue to take Acetaminophen as directed for pain . Continue to use DME with ambulation and wear supportive shoes  Please see past updates related to this goal by  clicking on the "Past Updates" button in the selected goal      .  "to improve  my kidney function" (pt-stated)      CARE PLAN ENTRY (see longitudinal plan of care for additional care plan information)  Current Barriers:  Marland Kitchen Knowledge Deficits related to disease process and Self Health management of Chronic Kidney Disease  . Chronic Disease Management support and education needs related to Type 2 diabetes mellitus, Essential hypertension, Mixed Hyperlipidemia, Decreased strength of lower extremity, stage 4 chronic kidney disease   Nurse Case Manager Clinical Goal(s):  Marland Kitchen Over the next 90 days, patient will work with the CCM team and PCP to address needs related to disease education and support for improved Self Health management of CKD   CCM RN CM Interventions:  06/14/20 call completed with patient  . Inter-disciplinary care team collaboration (see longitudinal plan of care) . Evaluation of current treatment plan related to stage 4 chronic kidney disease and patient's adherence to plan as established by provider. . Provided education to patient re: current GFR <24; Educated on Stages of chronic kidney disease; Educated on importance of staying well hydrated by drinking plenty of water; Educated on importance of keeping Hypertension and Diabetes under good control . Determined patient is established with [facility], Dr. Moshe Cipro, who is monitoring her renal function closely  . Discussed plans with patient for ongoing care management follow up and provided patient with direct contact information for care management team  Patient Self Care Activities:  . Continue to self administer medications as prescribed . Attend all scheduled provider appointments . Call pharmacy for medication refills . Call provider office for new concerns or questions . Drink at least 64 oz of water daily unless otherwise directed by MD  Initial goal documentation     .  "to keep my blood pressure under  good control" (pt-stated)      CARE PLAN ENTRY (see longitudinal plan of care for additional care plan information)  Current Barriers:  Marland Kitchen Knowledge Deficits related to disease process and Self Health management of Hypertension  . Chronic Disease Management support and education needs related to Type 2 diabetes mellitus, Essential hypertension, Mixed Hyperlipidemia, Decreased strength of lower extremity, stage 4 chronic kidney disease   Nurse Case Manager Clinical Goal(s):  Marland Kitchen Over the next 90 days, patient will work with the CCM team and PCP to address needs related to disease education and support for improved Self Health management of Hypertension   CCM RN CM Interventions:  06/14/20 call completed with patient  . Inter-disciplinary care team collaboration (see longitudinal plan of care) . Evaluation of current treatment plan related to Hypertension and patient's adherence to plan as established by provider . Provided education to patient re: target BP <130/80; Educated on dietary and exercise recommendations . Determined patient is being followed by the [facility]; Determined she is being followed by Velora Heckler RPH-CPP to assist with medication monitoring and dosage adjustments as needed to improve Hypertension  . Reviewed medications with patient and discussed patient is adhering to her prescribed regimen per Cardiology; Current regimen:  o Amlodipine 29m daily (8am) o Carvedilol 25 mg twice daily (8am & 9pm) o Furosemide 432mon MWF o Hydralazine 10046mhree times daily (8am & noon & 9pm) . Instructed patient to continue to monitor her BP at home 1-2 times daily and record readings; Instructed to notify the Pharm D and or Cardiology promptly of abnormally high or low readings  . Provided patient with printed educational materials related to What is High Blood Pressure; Why Should I Limit Sodium?;  Life's Simple 7 . Reviewed scheduled/upcoming  provider appointments including: Pharm D f/u 07/26/20 _0 :30 AM  . Discussed plans with patient for ongoing care management follow up and provided patient with direct contact information for care management team  Patient Self Care Activities:  . Continue to self administers medications as prescribed . Continue to monitor her BP at home 1-2 times daily and record readings . Notify the Pharm D and or Cardiology promptly of abnormally high or low readings  . Continue to adhere to a low Sodium heart healthy diet  . Attend all scheduled provider appointments . Call pharmacy for medication refills . Call provider office for new concerns or questions  Initial goal documentation     .  Blood Pressure < 130/80    .  Exercise 150 min/wk Moderate Activity      09/07/2019, wants to start exercising 10 minutes a day three days    .  Patient Stated      09/27/2020. Wants to get BP down, wants to weigh 240 pounds    .  Weight (lb) < 200 lb (90.7 kg) (pt-stated)      Depression Screen PHQ 2/9 Scores 09/27/2020 12/05/2019 09/07/2019 03/03/2019 12/08/2018 12/01/2018 09/01/2018  PHQ - 2 Score 0 0 0 0 0 0 0  PHQ- 9 Score - - 0 - - - 0    Fall Risk Fall Risk  09/27/2020 12/05/2019 09/07/2019 03/03/2019 12/01/2018  Falls in the past year? 0 0 0 0 0  Number falls in past yr: - 0 - - -  Injury with Fall? - 0 - - -  Risk for fall due to : Medication side effect - Medication side effect - -  Follow up Falls evaluation completed;Education provided;Falls prevention discussed - Falls evaluation completed;Education provided;Falls prevention discussed - -    FALL RISK PREVENTION PERTAINING TO THE HOME:  Any stairs in or around the home? Yes  If so, are there any without handrails? No  Home free of loose throw rugs in walkways, pet beds, electrical cords, etc? Yes  Adequate lighting in your home to reduce risk of falls? Yes   ASSISTIVE DEVICES UTILIZED TO PREVENT FALLS:  Life alert? No  Use of a cane, walker or  w/c? No  Grab bars in the bathroom? Yes  Shower chair or bench in shower? Yes  Elevated toilet seat or a handicapped toilet? Yes   TIMED UP AND GO:  Was the test performed? No .   Cognitive Function:     6CIT Screen 09/27/2020 09/07/2019 09/01/2018  What Year? 0 points 0 points 0 points  What month? 0 points 0 points 0 points  What time? 0 points 0 points 0 points  Count back from 20 0 points 0 points 0 points  Months in reverse 0 points 0 points 0 points  Repeat phrase 6 points 4 points 2 points  Total Score _1 Immunizations Immunization History  Administered Date(s) Administered  . Fluad Quad(high Dose 65+) 06/13/2020  . Influenza-Unspecified 04/22/2018  . Janssen (J&J) SARS-COV-2 Vaccination 10/24/2019  . PFIZER(Purple Top)SARS-COV-2 Vaccination 03/12/2020, 07/24/2020  . Pneumococcal Conjugate-13 03/12/2020  . Tdap 01/18/2015  . Zoster Recombinat (Shingrix) 03/20/2020    TDAP status: Up to date  Flu Vaccine status: Up to date  Pneumococcal vaccine status: Up to date  Covid-19 vaccine status: Completed vaccines  Qualifies for Shingles Vaccine? Yes   Zostavax completed Yes   Shingrix Completed?: No.    Education has been  provided regarding the importance of this vaccine. Patient has been advised to call insurance company to determine out of pocket expense if they have not yet received this vaccine. Advised may also receive vaccine at local pharmacy or Health Dept. Verbalized acceptance and understanding.  Screening Tests Health Maintenance  Topic Date Due  . COLONOSCOPY (Pts 45-66yr Insurance coverage will need to be confirmed)  06/19/2020  . PNA vac Low Risk Adult (2 of 2 - PPSV23) 03/12/2021  . HEMOGLOBIN A1C  03/15/2021  . OPHTHALMOLOGY EXAM  09/05/2021  . FOOT EXAM  09/12/2021  . MAMMOGRAM  03/12/2022  . TETANUS/TDAP  01/17/2025  . INFLUENZA VACCINE  Completed  . DEXA SCAN  Completed  . COVID-19 Vaccine  Completed  . Hepatitis C Screening  Completed   . HPV VACCINES  Aged Out    Health Maintenance  Health Maintenance Due  Topic Date Due  . COLONOSCOPY (Pts 45-477yrInsurance coverage will need to be confirmed)  06/19/2020    Colorectal cancer screening: Referral to GI placed today. Pt aware the office will call re: appt.  Mammogram status: Completed 03/12/2020. Repeat every year  Bone Density status: Completed 02/15/2018. Results reflect: Bone density results: NORMAL. Repeat every 0 years.  Lung Cancer Screening: (Low Dose CT Chest recommended if Age 70-80ears, 30 pack-year currently smoking OR have quit w/in 15years.) does not qualify.   Lung Cancer Screening Referral: no  Additional Screening:  Hepatitis C Screening: does qualify; Completed 01/18/2018  Vision Screening: Recommended annual ophthalmology exams for early detection of glaucoma and other disorders of the eye. Is the patient up to date with their annual eye exam?  Yes  Who is the provider or what is the name of the office in which the patient attends annual eye exams? Dr. GrKaty Fitchf pt is not established with a provider, would they like to be referred to a provider to establish care? No .   Dental Screening: Recommended annual dental exams for proper oral hygiene  Community Resource Referral / Chronic Care Management: CRR required this visit?  Yes   CCM required this visit?  No      Plan:     I have personally reviewed and noted the following in the patient's chart:   . Medical and social history . Use of alcohol, tobacco or illicit drugs  . Current medications and supplements . Functional ability and status . Nutritional status . Physical activity . Advanced directives . List of other physicians . Hospitalizations, surgeries, and ER visits in previous 12 months . Vitals . Screenings to include cognitive, depression, and falls . Referrals and appointments  In addition, I have reviewed and discussed with patient certain preventive protocols, quality  metrics, and best practice recommendations. A written personalized care plan for preventive services as well as general preventive health recommendations were provided to patient.     NiKellie SimmeringLPN   09/18/99/7793 Nurse Notes:

## 2020-09-27 NOTE — Telephone Encounter (Signed)
   Telephone encounter was:  Successful.  09/27/2020 Name: BELENDA HEIKKINEN MRN: [id number] DOB: 1950-12-20  FELINA GREYNOLDS is a 70 y.o. year old female who is a primary care patient of [name], [place] . The community resource team was consulted for assistance with Financial Difficulties related to Gas, water ,  electric   Care guide performed the following interventions: Patient provided with information about care guide support team and interviewed to confirm resource needs.  Follow Up Plan:  Care guide will follow up with patient by phone over the next few days

## 2020-09-28 ENCOUNTER — Telehealth: Payer: Self-pay | Admitting: *Deleted

## 2020-09-28 ENCOUNTER — Telehealth: Payer: Self-pay | Admitting: Licensed Clinical Social Worker

## 2020-09-28 LAB — BMP8+EGFR
BUN/Creatinine Ratio: 15 (ref 12–28)
BUN: 31 mg/dL — ABNORMAL HIGH (ref 8–27)
CO2: 24 mmol/L (ref 20–29)
Calcium: 9.2 mg/dL (ref 8.7–10.3)
Chloride: 105 mmol/L (ref 96–106)
Creatinine, Ser: 2.03 mg/dL — ABNORMAL HIGH (ref 0.57–1.00)
Glucose: 90 mg/dL (ref 65–99)
Potassium: 5.7 mmol/L — ABNORMAL HIGH (ref 3.5–5.2)
Sodium: 144 mmol/L (ref 134–144)
eGFR: 26 mL/min/{1.73_m2} — ABNORMAL LOW (ref >59)

## 2020-09-28 NOTE — Telephone Encounter (Signed)
Secure message sent to [facility], NT, and care guide who has reached out also to pt in regards to assistance w/ outstanding bills. I shared what resources had been provided to pt by this Probation officer. Remain available as needed, HIPAA compliant f/u message was left with pt yesterday but no return call yet at this time.   Westley Hummer, MSW, [place]  [phone]

## 2020-09-28 NOTE — Telephone Encounter (Signed)
   Telephone encounter was:  Successful.  09/28/2020 Name: [name] MRN: [id number] DOB: [birth year]  Kimberly Gross is a 70 y.o. year old female who is a primary care patient of [name], [place] . The community resource team was consulted for assistance with Financial Difficulties related to billbi  Care guide performed the following interventions: Patient provided with information about care guide support team and interviewed to confirm resource needs Discussed resources to assist with bill. Patient lives with her grandchildren, owes $900 in gas, $400 in [employer] and $200 in water. She has applied to all the state assisted programs for low income utility help. .  Follow Up Plan:  Care guide will follow up with patient by phone over the next week  [place], Care Management  [phone] [address] , [place] [address] Email : Ashby [email or web address]. [email or web address]-moran [email or web address]

## 2020-10-01 ENCOUNTER — Ambulatory Visit: Payer: Medicare HMO | Admitting: Adult Health

## 2020-10-03 ENCOUNTER — Telehealth: Payer: Self-pay | Admitting: *Deleted

## 2020-10-03 ENCOUNTER — Telehealth: Payer: Medicare (Managed Care)

## 2020-10-03 ENCOUNTER — Ambulatory Visit (INDEPENDENT_AMBULATORY_CARE_PROVIDER_SITE_OTHER): Payer: Medicare (Managed Care)

## 2020-10-03 DIAGNOSIS — I10 Essential (primary) hypertension: Secondary | ICD-10-CM

## 2020-10-03 DIAGNOSIS — N184 Chronic kidney disease, stage 4 (severe): Secondary | ICD-10-CM | POA: Diagnosis not present

## 2020-10-03 DIAGNOSIS — E1122 Type 2 diabetes mellitus with diabetic chronic kidney disease: Secondary | ICD-10-CM

## 2020-10-03 DIAGNOSIS — E782 Mixed hyperlipidemia: Secondary | ICD-10-CM | POA: Diagnosis not present

## 2020-10-03 DIAGNOSIS — R29898 Other symptoms and signs involving the musculoskeletal system: Secondary | ICD-10-CM

## 2020-10-03 NOTE — Telephone Encounter (Signed)
   Telephone encounter was:  Unsuccessful.  10/03/2020 Name: Kimberly Gross MRN: [id number] DOB: 1950/09/08  Unsuccessful outbound call made today to assist with:  Financial Difficulties related to utilities  and other loans   Outreach Attempt:  2nd Attempt  A HIPAA compliant voice message was left requesting a return call.  Instructed patient to call back at [place], Care Management  [phone] [address] , [place] [address] Email : Ashby [email or web address]

## 2020-10-09 ENCOUNTER — Telehealth: Payer: Self-pay | Admitting: *Deleted

## 2020-10-09 NOTE — Chronic Care Management (AMB) (Signed)
Chronic Care Management   CCM RN Visit Note  10/03/2020 Name: Kimberly Gross MRN: [id number] DOB: [birth year]  Subjective: Kimberly Gross is a 70 y.o. year old female who is a primary care patient of [name], [place]. The care management team was consulted for assistance with disease management and care coordination needs.    Engaged with patient by telephone for follow up visit in response to provider referral for case management and/or care coordination services.   Consent to Services:  The patient was given information about Chronic Care Management services, agreed to services, and gave verbal consent prior to initiation of services.  Please see initial visit note for detailed documentation.   Patient agreed to services and verbal consent obtained.   Assessment: Review of patient past medical history, allergies, medications, health status, including review of consultants reports, laboratory and other test data, was performed as part of comprehensive evaluation and provision of chronic care management services.   SDOH (Social Determinants of Health) assessments and interventions performed:  Yes, referral sent   CCM Care Plan  Allergies  Allergen Reactions  . Chlorpromazine Rash    Outpatient Encounter Medications as of 10/03/2020  Medication Sig Note  . ACCU-CHEK AVIVA PLUS test strip TEST BLOOD SUGAR TWICE DAILY   . Accu-Chek Softclix Lancets lancets USE AS DIRECTED  TO CHECK BLOOD GLUCOSE   . acetaminophen (TYLENOL) 500 MG tablet Take 1,000 mg by mouth 2 (two) times a day.   . Alcohol Swabs (B-D SINGLE USE SWABS REGULAR) PADS Use as directed to check blood sugars twice daily   . amLODipine (NORVASC) 10 MG tablet Take 1 tablet (10 mg total) by mouth daily.   Marland Kitchen aspirin 81 MG tablet Take 81 mg by mouth daily.  05/08/2015: Received from: External Pharmacy Received Sig:   . atorvastatin (LIPITOR) 40 MG tablet TAKE 1 TABLET BY MOUTH EVERYDAY AT BEDTIME   . BIOTIN PO Take 1 tablet  by mouth daily at 12 noon.   . Blood Glucose Monitoring Suppl (ACCU-CHEK AVIVA PLUS) w/Device KIT Check blood sugar twice a day   . carvedilol (COREG) 25 MG tablet Take 1 tablet (25 mg total) by mouth 2 (two) times daily.   . cholecalciferol (VITAMIN D3) 25 MCG (1000 UNIT) tablet Take 1,000 Units by mouth daily.   . clonazePAM (KLONOPIN) 0.5 MG tablet Take 1 tablet (0.5 mg total) by mouth 2 (two) times daily.   . divalproex (DEPAKOTE) 500 MG DR tablet 500 mg. 2 pills 2 times per day 05/08/2015: Received from: External Pharmacy  . DM-APAP-CPM (CORICIDIN HBP PO) Take by mouth as needed.   . fenofibrate 160 MG tablet TAKE 1 TABLET BY MOUTH EVERY DAY   . ferrous sulfate 325 (65 FE) MG tablet Take 325 mg by mouth daily with breakfast.   . furosemide (LASIX) 40 MG tablet TAKE 1 TABLET ON MONDAY, WEDNESDAY AND FRIDAY   . hydrALAZINE (APRESOLINE) 100 MG tablet Take 1 tablet (100 mg total) by mouth 3 (three) times daily.   . Insulin Degludec-Liraglutide (XULTOPHY) 100-3.6 UNIT-MG/ML SOPN Inject 30 Units into the skin daily.   . Insulin Pen Needle (DROPLET PEN NEEDLES) 32G X 4 MM MISC USE WITH XULTOPHY ONE TIME DAILY.   . Multiple Vitamins-Minerals (MULTIVITAMIN ADULT PO) Use as directed 1 tablet in the mouth or throat daily. 05/08/2015: Received from: External Pharmacy Received Sig:   . traZODone (DESYREL) 50 MG tablet    . vitamin B-12 (CYANOCOBALAMIN) 250 MCG tablet Take 250 mcg by  mouth daily.    No facility-administered encounter medications on file as of 10/03/2020.    Patient Active Problem List   Diagnosis Date Noted  . CKD (chronic kidney disease) stage 4, GFR 15-29 ml/min (HCC) 09/12/2020  . Cough 09/10/2018  . Type 2 diabetes mellitus without complication, without long-term current use of insulin ([place]) 05/14/2018  . Essential hypertension 05/14/2018  . Upper respiratory infection, viral 05/14/2018  . Hyperlipidemia 05/05/2018  . Morbid (severe) obesity due to excess calories ([place])  01/22/2017  . OSA (obstructive sleep apnea) 01/22/2017  . Tremor 05/08/2015  . Anxiety state 05/08/2015  . Medication-induced postural tremor 05/08/2015  . Bipolar 1 disorder, mixed, moderate ([place]) 05/08/2015    Conditions to be addressed/monitored:Type 2 diabetes mellitus, Essential hypertension, Mixed Hyperlipidemia, Decreased strength of lower extremity, stage 4 chronic kidney disease   Care Plan : Chronic Kidney (Adult)  Updates made by Lynne Logan, RN since 10/09/2020 12:00 AM    Problem: Disease Progression   Priority: High    Long-Range Goal: Disease Progression Prevented or Minimized   Start Date: 10/03/2020  Expected End Date: 04/05/2021  This Visit's Progress: On track  Priority: High  Note:   Current Barriers:   Ineffective Self Health Maintenance Clinical Goal(s):  Marland Kitchen Collaboration with [name], FNP regarding development and update of comprehensive plan of care as evidenced by provider attestation and co-signature . Inter-disciplinary care team collaboration (see longitudinal plan of care)  patient will work with care management team to address care coordination and chronic disease management needs related to Disease Management  Educational Needs  Care Coordination  Medication Management and Education  Psychosocial Support   Interventions:   Evaluation of current treatment plan related to   stage 4 chronic kidney disease  , self-management and patient's adherence to plan as established by provider.  Collaboration with [name], FNP regarding development and update of comprehensive plan of care as evidenced by provider attestation       and co-signature  Inter-disciplinary care team collaboration (see longitudinal plan of care) . Provided education to patient about basic disease process for CKD . Review of patient status, including review of consultants reports, relevant laboratory and other test results, and medications completed. . Educated  patient on dietary recommendations including salt substitutes and importance of avoiding Ms. Dash due to elevated potassium levels . Reviewed medications with patient and discussed importance of medication adherence  Reviewed scheduled/upcoming provider appointments including: next PCP follow up appointment scheduled for 12/13/20 _0  AM   Mailed printed educational materials related to Eating Right For Chronic Kidney Disease; Potassium and Kidney disease   Discussed plans with patient for ongoing care management follow up and provided patient with direct contact information for care management team Self Care Activities:  . Continue to self administers medications as prescribed . Continue to adhere to a low Sodium heart healthy diet  . Attend all scheduled provider appointments . Call pharmacy for medication refills . Call provider office for new concerns or questions Patient Goals: - drink 6 to 8 glasses of water each day - review patient educational materials related to Eating Right For Chronic Kidney Disease; Potassium and Kidney disease   Follow Up Plan: Telephone follow up appointment with care management team member scheduled for: 12/14/20   Care Plan : Hypertension (Adult)  Updates made by Lynne Logan, RN since 10/09/2020 12:00 AM    Problem: Hypertension (Hypertension)   Priority: Medium    Long-Range Goal: Hypertension Monitored  Start Date: 10/03/2020  Expected End Date: 04/05/2021  This Visit's Progress: On track  Priority: Medium  Note:   Objective:  . Last practice recorded BP readings:  BP Readings from Last 3 Encounters:  09/24/20 (!) 142/80  09/12/20 124/80  08/09/20 (!) 148/80 .   Marland Kitchen Most recent eGFR/CrCl: No results found for: EGFR  No components found for: CRCL Current Barriers:  Marland Kitchen Knowledge Deficits related to basic understanding of hypertension pathophysiology and self care management . Knowledge Deficits related to understanding of medications prescribed  for management of hypertension Case Manager Clinical Goal(s):  . patient will demonstrate improved adherence to prescribed treatment plan for hypertension as evidenced by taking all medications as prescribed, monitoring and recording blood pressure as directed, adhering to low sodium/DASH diet Interventions:  . Collaboration with [name], FNP regarding development and update of comprehensive plan of care as evidenced by provider attestation and co-signature . Inter-disciplinary care team collaboration (see longitudinal plan of care) . 10/03/20 successful outbound call completed with patient  . Evaluation of current treatment plan related to Hypertension and patient's adherence to plan as established by provider . Provided education to patient re: target BP <130/80; Educated on dietary and exercise recommendations . Determined patient continues to be followed by the [facility]; Determined she is being followed by Velora Heckler RPH-CPP to assist with medication monitoring and dosage adjustments as needed to improve Hypertension  . Reviewed medications with patient and discussed patient is adhering to her prescribed regimen per Cardiology; Current regimen:  o Amlodipine $RemoveBefo'10mg'ARvGkrgTeHC$  daily (8am) o Carvedilol 25 mg twice daily (8am & 9pm) o Furosemide $RemoveBefo'40mg'zllmsKpHPtF$  on MWF o Hydralazine $RemoveBefor'100mg'ATzqRYlhKSTt$  three times daily (8am & noon & 9pm) . Instructed patient to continue to monitor her BP at home 1-2 times daily and record readings; Instructed to notify the Pharm D and or Cardiology promptly of abnormally high or low readings  . Reviewed and discussed ongoing follow up with [facility] q 3 MONTHS FOR HYPERTENSION.  Marland Kitchen Discussed plans with patient for ongoing care management follow up and provided patient with direct contact information for care management team . Patient Self Care Activities:  . Continue to self administers medications as prescribed . Continue to monitor her BP at home 1-2  times daily and record readings . Notify the Pharm D and or Cardiology promptly of abnormally high or low readings  . Continue to adhere to a low Sodium heart healthy diet  . Attend all scheduled provider appointments . Call pharmacy for medication refills . Call provider office for new concerns or questions Patient Goals: - check blood pressure 3 times per week - write blood pressure results in a log or diary  Follow Up Plan: Telephone follow up appointment with care management team member scheduled for: 12/14/20   Care Plan : Vitamin D deficiency  Updates made by Lynne Logan, RN since 10/09/2020 12:00 AM    Problem: Vitamin D deficiency   Priority: Medium    Long-Range Goal: Vitamin D deficiency - treatment optimized   Start Date: 10/03/2020  Expected End Date: 04/05/2021  This Visit's Progress: On track  Priority: Medium  Note:   Current Barriers:   Ineffective Self Health Maintenance Clinical Goal(s):  Marland Kitchen Collaboration with [name], FNP regarding development and update of comprehensive plan of care as evidenced by provider attestation and co-signature . Inter-disciplinary care team collaboration (see longitudinal plan of care)  patient will work with care management team to address care  coordination and chronic disease management needs related to Disease Management  Educational Needs  Care Coordination  Medication Management and Education  Psychosocial Support   Interventions:   Evaluation of current treatment plan related to    , self-management and patient's adherence to plan as established by provider.  Collaboration with [name], FNP regarding development and update of comprehensive plan of care as evidenced by provider attestation       and co-signature  Inter-disciplinary care team collaboration (see longitudinal plan of care) . Provided education to patient about basic disease process related to Vitamin D deficiency  . Review of patient status,  including review of consultants reports, relevant laboratory and other test results, and medications completed. . Educated patient about dietary recommendations and the importance of getting natural sunlight at least 15 minutes daily when possible . Reviewed medications with patient and discussed importance of medication adherence . Reviewed scheduled/upcoming provider appointments including: next PCP follow up appointment scheduled for 12/13/20 @ 10 AM   Discussed plans with patient for ongoing care management follow up and provided patient with direct contact information for care management team Self Care Activities:  . Continue to self administers medications as prescribed . Attend all scheduled provider appointments . Call pharmacy for medication refills . Call provider office for new concerns or questions . Eat foods rich in Vitamin D as directed  . Get at least 15 minutes of natural sunlight daily when possible   Patient Goals: - to improve Vitamin D deficiency  Follow Up Plan: Telephone follow up appointment with care management team member scheduled for: 12/14/20    Plan:Telephone follow up appointment with care management team member scheduled for:  12/14/20  Barb Merino, RN, BSN, CCM Care Management Coordinator [place] [facility]  Direct Phone: [phone]

## 2020-10-09 NOTE — Patient Instructions (Signed)
Goals Addressed    Other   .  Follow My Treatment Plan-Chronic Kidney   On track     Timeframe:  Long-Range Goal Priority:  High Start Date:  10/03/20                           Expected End Date:  04/05/21                     Follow Up Date: 12/13/20    - ask for help if I can't afford my medicines - call for medicine refill 2 or 3 days before it runs out - call the doctor or nurse before I stop taking medicine - call the doctor or nurse to get help with side effects - keep follow-up appointments - keep taking my medicines, even when I feel good - set alarm to remind of prescription refill date(s) - set alarm to remind to take medications    Why is this important?    Staying as healthy as you can is very important. This may mean making changes if you smoke, don't exercise or eat poorly.   A healthy lifestyle is an important goal for you.   Following the treatment plan and making changes may be hard.   Try some of these steps to help keep the disease from getting worse.     Notes:     Marland Kitchen  Manage My Diet   On track     Timeframe:  Long-Range Goal Priority:  High Start Date:  10/04/31                          Expected End Date:  04/05/21                     Follow Up Date: 12/13/20    - ask for help if I have trouble affording healthy foods - choose foods low in fat and sugar - choose foods that are low in sodium (salt) - prepare or eat main meal at home 3 to 5 days each week - keep healthy snacks on hand - read food labels for sodium (salt), fat and sugar content - watch for swelling in feet, ankles and legs every day - weigh myself daily  - work with embedded BSW for resource needs    Why is this important?    A healthy diet is important for mental and physical health.   Healthy food helps repair damaged body tissue and maintains strong bones and muscles.   No single food is just right so eating a variety of proteins, fruits, vegetables and grains is best.   You may  need to change what you eat or drink to manage kidney disease.   A dietitian is the best person to guide you.     Notes:     .  Track and Manage My Blood Pressure-Hypertension   On track     Timeframe:  Long-Range Goal Priority:  Medium Start Date: 10/03/20                            Expected End Date: 04/05/21                     Follow Up Date: 12/14/20     Patient Self Care Activities:  -Continue to self administers medications as  prescribed -Continue to monitor her BP at home 1-2 times daily and record readings -Notify the Pharm D and or Cardiology promptly of abnormally high or low readings  -Continue to adhere to a low Sodium heart healthy diet  -Attend all scheduled provider appointments -Call pharmacy for medication refills -Call provider office for new concerns or questions   Why is this important?    You won't feel high blood pressure, but it can still hurt your blood vessels.   High blood pressure can cause heart or kidney problems. It can also cause a stroke.   Making lifestyle changes like losing a Kimberly Gross weight or eating less salt will help.   Checking your blood pressure at home and at different times of the day can help to control blood pressure.   If the doctor prescribes medicine remember to take it the way the doctor ordered.   Call the office if you cannot afford the medicine or if there are questions about it.     Notes:     .  Track and Manage My Symptoms-Chronic Kidney   On track     Timeframe:  Long-Range Goal Priority:  High Start Date: 10/03/20                            Expected End Date:  04/05/21                     Follow Up Date: 12/14/20    Self Care Activities:  . Continue to self administers medications as prescribed . Continue to adhere to a low Sodium heart healthy diet  . Attend all scheduled provider appointments . Call pharmacy for medication refills . Call provider office for new concerns or questions - drink 6 to 8 glasses of water  each day - review patient educational materials related to Eating Right For Chronic Kidney Disease; Potassium and Kidney disease    Why is this important?    Keeping track of symptoms can help you feel the best. It also helps the doctor stay on top of any changes to the disease. It may also help keep your disease from getting worse.   Taking simple steps can help you cope with symptoms like feeling very tired or itchy skin.     Notes:     Marland Kitchen  Vitamin D deficiency - treatment optimized   On track     Timeframe:  Long-Range Goal Priority:  Medium Start Date:  10/03/20                          Expected End Date:  04/05/21     Next Follow Up Date: 12/13/20  Self Care Activities:  . Continue to self administers medications as prescribed . Attend all scheduled provider appointments . Call pharmacy for medication refills . Call provider office for new concerns or questions . Eat foods rich in Vitamin D as directed  . Get at least 15 minutes of natural sunlight daily when possible

## 2020-10-09 NOTE — Telephone Encounter (Signed)
   Telephone encounter was:  Unsuccessful.  10/09/2020 Name: Kimberly Gross MRN: [id number] DOB: [birth year]  Unsuccessful outbound call made today to assist with:  Financial Difficulties related to utilities  Outreach Attempt:  2nd Attempt  A HIPAA compliant voice message was left requesting a return call.  Patient has been mailed all resources plus  LCSW has provided resources as well for energy assistance voice mail unable to accept new messages CG closing referal as all available community services have been provided [place] , [place], Care Management  [phone] [address] , [place] [address] Email : Ashby Dawes. Greenauer-moran [email or web address]

## 2020-10-10 ENCOUNTER — Telehealth: Payer: Medicare (Managed Care)

## 2020-10-10 ENCOUNTER — Telehealth: Payer: Self-pay

## 2020-10-10 ENCOUNTER — Other Ambulatory Visit: Payer: Self-pay | Admitting: Nurse Practitioner

## 2020-10-10 NOTE — Telephone Encounter (Signed)
  Chronic Care Management   Outreach Note  10/10/2020 Name: VANESIA WORKING MRN: [id number] DOB: 06/16/1951  Referred by: [name], FNP Reason for referral : Chronic Care Management (Unsuccessful outbound call)   An unsuccessful telephone outreach was attempted today. The patient was referred to the case management team for assistance with care management and care coordination.   Follow Up Plan: Unable to leave a voice message due to the patients voice mailbox being full. SW will attempt a second outreach over the next 14 days.  Daneen Schick, BSW, CDP Social Worker, Certified Dementia Practitioner [place] / [place] [facility] [phone]

## 2020-10-22 ENCOUNTER — Telehealth: Payer: Self-pay

## 2020-10-22 ENCOUNTER — Telehealth: Payer: Medicare (Managed Care)

## 2020-10-22 NOTE — Telephone Encounter (Signed)
  Chronic Care Management   Outreach Note  10/22/2020 Name: Kimberly Gross MRN: [id number] DOB: July 21, 1950  Referred by: [name], FNP Reason for referral : Chronic Care Management (Unsuccessful attempt #2)   A second unsuccessful telephone outreach was attempted today. The patient was referred to the case management team for assistance with care management and care coordination.   Follow Up Plan: A HIPAA compliant phone message was left for the patient providing contact information and requesting a return call.  The care management team will reach out to the patient again over the next 14 days.   Daneen Schick, BSW, CDP Social Worker, Certified Dementia Practitioner [place] / [place] [facility] [phone]

## 2020-10-24 ENCOUNTER — Telehealth: Payer: Self-pay | Admitting: Internal Medicine

## 2020-10-24 MED ORDER — HYDRALAZINE HCL 100 MG PO TABS: 100.0000 mg | ORAL_TABLET | Freq: Three times a day (TID) | ORAL | 3 refills | Status: DC

## 2020-10-24 NOTE — Telephone Encounter (Signed)
*  STAT* If patient is at the pharmacy, call can be transferred to refill team.   1. Which medications need to be refilled? (please list name of each medication and dose if known) hydrALAZINE (APRESOLINE) 100 MG tablet   2. Which pharmacy/location (including street and city if local pharmacy) is medication to be sent to? [id number] [place], [place] - [address] AT [address]  3. Do they need a 30 day or 90 day supply?  90   PT is completley out of this medication.Please advise

## 2020-10-24 NOTE — Telephone Encounter (Signed)
Rx sent to pharmacy.  Patient made aware.

## 2020-10-25 ENCOUNTER — Other Ambulatory Visit: Payer: Self-pay | Admitting: Nurse Practitioner

## 2020-10-25 DIAGNOSIS — E119 Type 2 diabetes mellitus without complications: Secondary | ICD-10-CM

## 2020-11-01 ENCOUNTER — Telehealth: Payer: Medicare (Managed Care)

## 2020-11-02 ENCOUNTER — Telehealth: Payer: Self-pay

## 2020-11-02 ENCOUNTER — Telehealth: Payer: Medicare (Managed Care)

## 2020-11-02 NOTE — Telephone Encounter (Signed)
  Chronic Care Management   Outreach Note  11/02/2020 Name: Kimberly Gross MRN: [id number] DOB: 1951/05/29  Referred by: [name], FNP Reason for referral : Chronic Care Management (Unsuccessful call #3)   Third unsuccessful telephone outreach was attempted today. The patient was referred to the case management team for assistance with care management and care coordination. The patient's primary care provider has been notified of our unsuccessful attempts to make or maintain contact with the patient. The care management team is pleased to engage with this patient at any time in the future should he/she be interested in assistance from the care management team.   Follow Up Plan: A HIPAA compliant phone message was left for the patient providing contact information and requesting a return call.  No SW follow up planned at this time due to inability to maintain patient contact.  Daneen Schick, BSW, CDP Social Worker, Certified Dementia Practitioner [place] / [place] [facility] [phone]

## 2020-12-05 ENCOUNTER — Telehealth: Payer: Self-pay

## 2020-12-06 NOTE — Telephone Encounter (Signed)
Error

## 2020-12-13 ENCOUNTER — Ambulatory Visit: Payer: Medicare (Managed Care) | Admitting: Internal Medicine

## 2020-12-14 ENCOUNTER — Telehealth: Payer: Medicare (Managed Care)

## 2020-12-24 NOTE — Progress Notes (Signed)
Patient ID: Kimberly Gross                 DOB: 1951-05-22                      MRN: [id number]     HPI: Kimberly Gross is a 70 y.o. female referred by Dr. Margaretann Gross to HTN clinic. PMH includes hypertension, DM, hyperlipidemia,bipolar disorder and, CKD IV. Patient continues to have lots of social stressors but has the emotional support of her daughter. She encouraged Kimberly Gross to walk every day and help with cooking to decrease sodium intake as much as possible.  Increased stress due to daughter's health issues and living arrangements.   Current HTN meds:  Amlodipine $RemoveBef'10mg'DhiivimbkK$  daily (8am) Carvedilol 25 mg twice daily (8am & 9pm) Furosemide $RemoveBeforeD'40mg'ZvvkSvOFCvxFhf$  on MWF Hydralazine $RemoveBeforeD'100mg'MzRYCrrnyAmGeP$  three times daily (8am & noon & 9pm)  Intolerance: none  BP goal: 130/80 (140/90 if unable to tolerate lower goal)  Family History: The patient's family history includes Heart attack in her father; Hypertension in her mother.   Social History: denies tobacco use, drinks 1-2 caffeineated drink every day  Diet: still eating a mix of home and take out, but has done more home cooked recently.  STOP eating hot dogs but eating Bologna.    Exercise: activies of daily living; lives with daughter and 4 grandkids (teens).Take her   Home BP readings: none provided. 120s and 130s per patient report  Wt Readings from Last 3 Encounters:  12/25/20 262 lb 6.4 oz (119 kg)  09/27/20 262 lb (118.8 kg)  09/24/20 263 lb (119.3 kg)   BP Readings from Last 3 Encounters:  12/25/20 (!) 156/82  09/24/20 (!) 142/80  09/12/20 124/80   Pulse Readings from Last 3 Encounters:  12/25/20 76  09/24/20 89  09/12/20 81    Past Medical History:  Diagnosis Date   Anemia    Anxiety    Bipolar 1 disorder (HCC)    CKD (chronic kidney disease), stage IV (HCC)    Depression    Diabetes mellitus without complication (HCC)    Hyperlipemia    Hypertension    Occasional tremors     Current Outpatient Medications on File Prior to Visit  Medication  Sig Dispense Refill   ACCU-CHEK AVIVA PLUS test strip TEST BLOOD SUGAR TWICE DAILY 200 strip 3   Accu-Chek Softclix Lancets lancets USE AS DIRECTED  TO CHECK BLOOD GLUCOSE 200 each 3   acetaminophen (TYLENOL) 500 MG tablet Take 1,000 mg by mouth 2 (two) times a day.     Alcohol Swabs (B-D SINGLE USE SWABS REGULAR) PADS Use as directed to check blood sugars twice daily 100 each 5   amLODipine (NORVASC) 10 MG tablet Take 1 tablet (10 mg total) by mouth daily. 90 tablet 1   aspirin 81 MG tablet Take 81 mg by mouth daily.      atorvastatin (LIPITOR) 40 MG tablet TAKE 1 TABLET BY MOUTH EVERYDAY AT BEDTIME 90 tablet 2   BIOTIN PO Take 1 tablet by mouth daily at 12 noon.     Blood Glucose Monitoring Suppl (ACCU-CHEK AVIVA PLUS) w/Device KIT Check blood sugar twice a day 1 kit 0   carvedilol (COREG) 25 MG tablet Take 1 tablet (25 mg total) by mouth 2 (two) times daily. 180 tablet 3   cholecalciferol (VITAMIN D3) 25 MCG (1000 UNIT) tablet Take 1,000 Units by mouth daily.     clonazePAM (KLONOPIN) 0.5 MG tablet Take  1 tablet (0.5 mg total) by mouth 2 (two) times daily. 30 tablet 0   divalproex (DEPAKOTE) 500 MG DR tablet 500 mg. 2 pills 2 times per day     DM-APAP-CPM (CORICIDIN HBP PO) Take by mouth as needed.     fenofibrate 160 MG tablet TAKE 1 TABLET BY MOUTH EVERY DAY 90 tablet 1   ferrous sulfate 325 (65 FE) MG tablet Take 325 mg by mouth daily with breakfast.     furosemide (LASIX) 40 MG tablet TAKE 1 TABLET ON MONDAY, WEDNESDAY AND FRIDAY 39 tablet 3   hydrALAZINE (APRESOLINE) 100 MG tablet Take 1 tablet (100 mg total) by mouth 3 (three) times daily. 270 tablet 3   Insulin Pen Needle (DROPLET PEN NEEDLES) 32G X 4 MM MISC USE WITH XULTOPHY ONE TIME DAILY. 100 each 2   Multiple Vitamins-Minerals (MULTIVITAMIN ADULT PO) Use as directed 1 tablet in the mouth or throat daily.     traZODone (DESYREL) 50 MG tablet      vitamin B-12 (CYANOCOBALAMIN) 250 MCG tablet Take 250 mcg by mouth daily.      XULTOPHY 100-3.6 UNIT-MG/ML SOPN INJECT 30 UNITS INTO THE SKIN DAILY. 9 mL 3   No current facility-administered medications on file prior to visit.    Allergies  Allergen Reactions   Chlorpromazine Rash    Blood pressure (!) 156/82, pulse 76, resp. rate 16, height $RemoveBe'5\' 3"'aDsuXhbbQ$  (1.6 m), weight 262 lb 6.4 oz (119 kg), SpO2 94 %.  Essential hypertension Blood pressure above goal today and worsened from previous office visit. Patient reports increased stressors in life and not taking her morning BP medication either. She stopped eating hot dogs , but replaced it with [employer].  Will resume all medication as previously prescribed, stop eating hot dog and bologna sandwiches, and take her morning medication prior to next OV in 4 weeks.  Kimberly Gross PharmD, BCPS, [place] [id number] 12/25/2020 9:50 AM

## 2020-12-25 ENCOUNTER — Other Ambulatory Visit: Payer: Self-pay

## 2020-12-25 ENCOUNTER — Ambulatory Visit (INDEPENDENT_AMBULATORY_CARE_PROVIDER_SITE_OTHER): Payer: Medicare (Managed Care) | Admitting: Pharmacist

## 2020-12-25 VITALS — BP 156/82 | HR 76 | Resp 16 | Ht 63.0 in | Wt 262.4 lb

## 2020-12-25 DIAGNOSIS — I10 Essential (primary) hypertension: Secondary | ICD-10-CM | POA: Diagnosis not present

## 2020-12-25 NOTE — Progress Notes (Signed)
Heart and Vascular Care Navigation  12/25/2020  BRET STAMOUR [birth year] [id number]  Reason for Referral:    Engaged with patient face to face for follow up visit for Heart and Vascular Care Coordination.                                                                                                   Assessment:         LCSW met with pt at the end of her appointment with pharmacy team at [facility] today. Reintroduced self, role, reason for visit. Pt has spoken with pt via telephone multiple times. I shared that both myself and [facility] care guides had been attempting to reach pt via telephone and it is often difficult due to voicemail being full or pt not returning/picking up calls. Pt shares she was not aware of this. Shares that her daughters are having multiple challenges including eviction, unemployment, custody challenges and that financially she has been supporting them and her grandchildren w/ her finances. She states she has paid none of her bills except insurance this month.   LCSW and pt processed the effect that this stress has had on her daughters and her as the family matriach. LCSW provided support for pt balancing it all. Pt remains upbeat throughout visit. She was provided my card. Her daughters goes to see [facility] providers, she was hoping there may be someone to assist her daughter with different challenges. I shared there is a LCSW, RNCM and financial counselor that may be able to assist her daughter w/ questions/concerns. LCSW assessed pt preference to receive resources- she would like them mailed.                                 HRT/VAS Care Coordination     Patients Home Cardiology Office [place] Team Social Worker   Social Worker Name: Margarito Liner [place], [phone]   Living arrangements for the past 2 months Single Family Home   Lives with: Adult Children; Relatives  4 grandchildren   Patient Current Insurance Coverage  Managed Medicare   Patient Has Concern With Paying Medical Bills No   Does Patient Have Prescription Coverage? Yes   Home Assistive Devices/Equipment Walker (specify type)       Social History:                                                                             SDOH Screenings   Alcohol Screen: Not on file  Depression (PHQ2-9): Low Risk    PHQ-2 Score: 0  Financial Resource Strain: High Risk   Difficulty of Paying Living Expenses: Hard  Food Insecurity: No Food Insecurity   Worried About Charity fundraiser  in the Last Year: Never true   Ran Out of Food in the Last Year: Never true  Housing: Medium Risk   Last Housing Risk Score: 1  Physical Activity: Inactive   Days of Exercise per Week: 0 days   Minutes of Exercise per Session: 0 min  Social Connections: Not on file  Stress: No Stress Concern Present   Feeling of Stress : Not at all  Tobacco Use: Low Risk    Smoking Tobacco Use: Never   Smokeless Tobacco Use: Never  Transportation Needs: No Transportation Needs   Lack of Transportation (Medical): No   Lack of Transportation (Non-Medical): No    SDOH Interventions: Financial Resources:  Financial Strain Interventions: Other (Comment) (utility/rent assistance packet; LIWAP application mailed; mailed pt information about credit counseling services through [facility] of the [place]) DSS for financial assistance  Food Insecurity:  Food Insecurity Interventions: Other (Comment) (pt does not qualify for SNAP per her report, mailed pantry list)  Housing Insecurity:  Housing Interventions: Other (Comment) (utility/rent assistance packet mailed; eviction mediation program information included for daughter along with Legal Aid hotline)  Transportation:   Transportation Interventions: [employer], Other (Comment) (pt referral sent to [employer] and mailed pt list of alternate transportation options)     Other Care Navigation Interventions:      Patient expressed Mental Health concerns Yes, Referred to:  provided list of mental health providers in local area for her and her family to process recent stressful events . Given my card should she want additional support with connecting to services.    Follow-up plan:   LCSW has mailed the following to [address], [place], [place], [address]: [employer] card, Games developer, Airline pilot, Nurse, children's, Utility/Rent Assistance list, Conservation officer, historic buildings, [employer] (water assistance) application, [employer], [facility] the [employer] mental health services, [place] provider list, Credit Counseling program w/ [employer] of the [place].   I had previously sent referral to Transportation Services for pt back on March 15th and pt/pt daughter had told them it wasn't a good time. I resent the referral today in the hopes she will enroll herself at this time. Pt has my card for any additional questions/concerns.

## 2020-12-25 NOTE — Patient Instructions (Addendum)
Return for a follow up appointment in 4 weeks  Check your blood pressure at home daily (if able) and keep record of the readings.  Take your BP meds as follows: *Please take morning blood pressure medication prior to next office visit*  Bring all of your meds, your BP cuff and your record of home blood pressures to your next appointment.  Exercise as you're able, try to walk approximately 30 minutes per day.  Keep salt intake to a minimum, especially watch canned and prepared boxed foods.  Eat more fresh fruits and vegetables and fewer canned items.  Avoid eating in fast food restaurants.    HOW TO TAKE YOUR BLOOD PRESSURE: Rest 5 minutes before taking your blood pressure.  Don't smoke or drink caffeinated beverages for at least 30 minutes before. Take your blood pressure before (not after) you eat. Sit comfortably with your back supported and both feet on the floor (don't cross your legs). Elevate your arm to heart level on a table or a desk. Use the proper sized cuff. It should fit smoothly and snugly around your bare upper arm. There should be enough room to slip a fingertip under the cuff. The bottom edge of the cuff should be 1 inch above the crease of the elbow. Ideally, take 3 measurements at one sitting and record the average.

## 2020-12-25 NOTE — Assessment & Plan Note (Signed)
Blood pressure above goal today and worsened from previous office visit. Patient reports increased stressors in life and not taking her morning BP medication either. She stopped eating hot dogs , but replaced it with [employer].  Will resume all medication as previously prescribed, stop eating hot dog and bologna sandwiches, and take her morning medication prior to next OV in 4 weeks.

## 2020-12-26 ENCOUNTER — Other Ambulatory Visit: Payer: Self-pay | Admitting: Nurse Practitioner

## 2021-01-14 ENCOUNTER — Other Ambulatory Visit: Payer: Self-pay | Admitting: Nurse Practitioner

## 2021-01-24 ENCOUNTER — Encounter (INDEPENDENT_AMBULATORY_CARE_PROVIDER_SITE_OTHER): Payer: Medicare (Managed Care) | Admitting: Ophthalmology

## 2021-01-24 ENCOUNTER — Ambulatory Visit (INDEPENDENT_AMBULATORY_CARE_PROVIDER_SITE_OTHER): Payer: Medicare (Managed Care) | Admitting: Pharmacist Clinician (PhC)/ Clinical Pharmacy Specialist

## 2021-01-24 DIAGNOSIS — I10 Essential (primary) hypertension: Secondary | ICD-10-CM | POA: Diagnosis not present

## 2021-01-24 NOTE — Progress Notes (Signed)
Patient ID: Kimberly Gross                 DOB: 30-May-1951                      MRN: [id number]     HPI: Kimberly Gross is a 70 y.o. female referred by Dr. Margaretann Gross to HTN clinic. PMH includes hypertension, DM, hyperlipidemia,bipolar disorder and, CKD IV. Patient continues to have lots of social stressors but has the emotional support of her daughter. She encouraged Kimberly Gross to walk every day and help with cooking to decrease sodium intake as much as possible.  Patient has also been working with Kimberly Hummer LCSW in our office to get assistance with utility bills.   Today she returns for follow up.  She is in good spirits today, states she has been working hard to stay on the medication regimen that was set at her last visit, and thinks she's taken her meds almost every day, as directed, in the past month.  States she has also cut back on eating out, and started eating more fruits and vegetables at home.   Current HTN meds:  Amlodipine $RemoveBef'10mg'mgetqnUutf$  daily (8am) Carvedilol 25 mg twice daily (8am & 9pm) Furosemide $RemoveBeforeD'40mg'pEMiVHObCBNphT$  on MWF Hydralazine $RemoveBeforeD'100mg'hbdsTOSmKaRbuw$  three times daily (8am & noon & 9pm)  Intolerance: none  BP goal: 130/80 (140/90 if unable to tolerate lower goal)  Family History: The patient's family history includes Heart attack in her father; Hypertension in her mother.   Social History: denies tobacco use, drinks 1-2 caffeineated drink every day  Diet: has stopped eating hot dogs and bologna !!  Now more spinach, fish and chicken, lite fruit coctkail  Exercise: activies of daily living; lives with daughter and 4 grandkids (teens).   Home BP readings: none provided. 120s and 130s per patient report  Wt Readings from Last 3 Encounters:  01/24/21 256 lb (116.1 kg)  12/25/20 262 lb 6.4 oz (119 kg)  09/27/20 262 lb (118.8 kg)   BP Readings from Last 3 Encounters:  01/24/21 116/64  12/25/20 (!) 156/82  09/24/20 (!) 142/80   Pulse Readings from Last 3 Encounters:  01/24/21 72  12/25/20 76   09/24/20 89    Past Medical History:  Diagnosis Date   Anemia    Anxiety    Bipolar 1 disorder (HCC)    CKD (chronic kidney disease), stage IV (HCC)    Depression    Diabetes mellitus without complication (HCC)    Hyperlipemia    Hypertension    Occasional tremors     Current Outpatient Medications on File Prior to Visit  Medication Sig Dispense Refill   ACCU-CHEK AVIVA PLUS test strip TEST BLOOD SUGAR TWICE DAILY 200 strip 3   Accu-Chek Softclix Lancets lancets USE AS DIRECTED  TO CHECK BLOOD GLUCOSE 200 each 3   acetaminophen (TYLENOL) 500 MG tablet Take 1,000 mg by mouth 2 (two) times a day.     Alcohol Swabs (B-D SINGLE USE SWABS REGULAR) PADS Use as directed to check blood sugars twice daily 100 each 5   amLODipine (NORVASC) 10 MG tablet TAKE 1 TABLET BY MOUTH EVERY DAY 90 tablet 1   aspirin 81 MG tablet Take 81 mg by mouth daily.      atorvastatin (LIPITOR) 40 MG tablet TAKE 1 TABLET BY MOUTH EVERYDAY AT BEDTIME 90 tablet 2   BIOTIN PO Take 1 tablet by mouth daily at 12 noon.     Blood  Glucose Monitoring Suppl (ACCU-CHEK AVIVA PLUS) w/Device KIT Check blood sugar twice a day 1 kit 0   carvedilol (COREG) 25 MG tablet Take 1 tablet (25 mg total) by mouth 2 (two) times daily. 180 tablet 3   cholecalciferol (VITAMIN D3) 25 MCG (1000 UNIT) tablet Take 1,000 Units by mouth daily.     clonazePAM (KLONOPIN) 0.5 MG tablet Take 1 tablet (0.5 mg total) by mouth 2 (two) times daily. 30 tablet 0   divalproex (DEPAKOTE) 500 MG DR tablet 500 mg. 2 pills 2 times per day     DM-APAP-CPM (CORICIDIN HBP PO) Take by mouth as needed.     fenofibrate 160 MG tablet TAKE 1 TABLET BY MOUTH EVERY DAY 90 tablet 1   ferrous sulfate 325 (65 FE) MG tablet Take 325 mg by mouth daily with breakfast.     furosemide (LASIX) 40 MG tablet TAKE 1 TABLET ON MONDAY, WEDNESDAY AND FRIDAY 39 tablet 3   hydrALAZINE (APRESOLINE) 100 MG tablet Take 1 tablet (100 mg total) by mouth 3 (three) times daily. 270 tablet 3    Insulin Pen Needle (DROPLET PEN NEEDLES) 32G X 4 MM MISC USE WITH XULTOPHY ONE TIME DAILY. 100 each 2   Multiple Vitamins-Minerals (MULTIVITAMIN ADULT PO) Use as directed 1 tablet in the mouth or throat daily.     traZODone (DESYREL) 50 MG tablet      vitamin B-12 (CYANOCOBALAMIN) 250 MCG tablet Take 250 mcg by mouth daily.     XULTOPHY 100-3.6 UNIT-MG/ML SOPN INJECT 30 UNITS INTO THE SKIN DAILY. 9 mL 3   No current facility-administered medications on file prior to visit.    Allergies  Allergen Reactions   Chlorpromazine Rash    Blood pressure 116/64, pulse 72, resp. rate 16, height $RemoveBe'5\' 3"'RcPAlRFnj$  (1.6 m), weight 256 lb (116.1 kg), SpO2 93 %.  Essential hypertension Patient with essential hypertension, finally has reached blood pressure goal.  Praised patient on adjusting her died to limit processed foods and focusing on medication compliance.  Reviewed dietary changes and encouraged that she continue with these and slowly work to further decrease processed foods.  Praised her adding more fruits and vegetables throughout the week.  Advised that she continue with current regimen.  She is scheduled to see Dr. Margaretann Gross in 2 months, we will follow up with her in 4 months to help keep her on track.    Tommy Medal PharmD CPP [facility] [address] [place] [address] 01/24/2021 5:01 PM

## 2021-01-24 NOTE — Assessment & Plan Note (Signed)
Patient with essential hypertension, finally has reached blood pressure goal.  Praised patient on adjusting her died to limit processed foods and focusing on medication compliance.  Reviewed dietary changes and encouraged that she continue with these and slowly work to further decrease processed foods.  Praised her adding more fruits and vegetables throughout the week.  Advised that she continue with current regimen.  She is scheduled to see Dr. Margaretann Loveless in 2 months, we will follow up with her in 4 months to help keep her on track.

## 2021-01-24 NOTE — Patient Instructions (Signed)
Return for a a follow up appointment November 15 at 11 am  Check your blood pressure at home 2-3 times each week and keep record of the readings.  If you see the top number go > 140 on 2 or more occasions, please call the office.    Take your BP meds as follows:  Continue with all current medications  Bring all of your meds, your BP cuff and your record of home blood pressures to your next appointment.  Exercise as you're able, try to walk approximately 30 minutes per day.  Keep salt intake to a minimum, especially watch canned and prepared boxed foods.  Eat more fresh fruits and vegetables and fewer canned items.  Avoid eating in fast food restaurants.    HOW TO TAKE YOUR BLOOD PRESSURE: Rest 5 minutes before taking your blood pressure.  Don't smoke or drink caffeinated beverages for at least 30 minutes before. Take your blood pressure before (not after) you eat. Sit comfortably with your back supported and both feet on the floor (don't cross your legs). Elevate your arm to heart level on a table or a desk. Use the proper sized cuff. It should fit smoothly and snugly around your bare upper arm. There should be enough room to slip a fingertip under the cuff. The bottom edge of the cuff should be 1 inch above the crease of the elbow. Ideally, take 3 measurements at one sitting and record the average.

## 2021-01-28 ENCOUNTER — Telehealth: Payer: Medicare (Managed Care)

## 2021-01-29 ENCOUNTER — Telehealth: Payer: Medicare (Managed Care)

## 2021-02-15 ENCOUNTER — Other Ambulatory Visit: Payer: Self-pay | Admitting: Nurse Practitioner

## 2021-02-15 DIAGNOSIS — Z1231 Encounter for screening mammogram for malignant neoplasm of breast: Secondary | ICD-10-CM

## 2021-03-04 ENCOUNTER — Telehealth: Payer: Medicare (Managed Care)

## 2021-03-04 ENCOUNTER — Ambulatory Visit (INDEPENDENT_AMBULATORY_CARE_PROVIDER_SITE_OTHER): Payer: Medicare (Managed Care)

## 2021-03-04 DIAGNOSIS — E782 Mixed hyperlipidemia: Secondary | ICD-10-CM

## 2021-03-04 DIAGNOSIS — I10 Essential (primary) hypertension: Secondary | ICD-10-CM | POA: Diagnosis not present

## 2021-03-04 DIAGNOSIS — N184 Chronic kidney disease, stage 4 (severe): Secondary | ICD-10-CM

## 2021-03-04 DIAGNOSIS — E1122 Type 2 diabetes mellitus with diabetic chronic kidney disease: Secondary | ICD-10-CM

## 2021-03-04 DIAGNOSIS — R29898 Other symptoms and signs involving the musculoskeletal system: Secondary | ICD-10-CM

## 2021-03-06 ENCOUNTER — Telehealth: Payer: Self-pay | Admitting: *Deleted

## 2021-03-06 NOTE — Chronic Care Management (AMB) (Signed)
  Chronic Care Management   Note  03/06/2021 Name: LANEKA DOEPKE MRN: [id number] DOB: 31-Aug-1950  DELANEY SEELE is a 70 y.o. year old female who is a primary care patient of [name], [place]. ANIKKA BREYFOGLE is currently enrolled in care management services. An additional referral for Pharmacy was placed.   Follow up plan: Unsuccessful telephone outreach attempt made. A HIPAA compliant phone message was left for the patient providing contact information and requesting a return call.  The care management team will reach out to the patient again over the next 7 days.  If patient returns call to provider office, please advise to call [place] at [phone].  [place] Management  Direct Dial: [phone]

## 2021-03-11 NOTE — Chronic Care Management (AMB) (Signed)
  Chronic Care Management   Note  03/11/2021 Name: ISBELLA VIGH MRN: [id number] DOB: 1951-01-13  ALESA HERTLING is a 70 y.o. year old female who is a primary care patient of [name], [place]. JAIONNA VANSELOW is currently enrolled in care management services. An additional referral for Pharmacy  was placed.   Follow up plan: Unsuccessful telephone outreach attempt made. A HIPAA compliant phone message was left for the patient providing contact information and requesting a return call.  The care management team will reach out to the patient again over the next 7 days.  If patient returns call to provider office, please advise to call [place] at [phone].  [place] Management  Direct Dial: [phone]

## 2021-03-11 NOTE — Chronic Care Management (AMB) (Signed)
Chronic Care Management   CCM RN Visit Note  03/04/2021 Name: Kimberly Gross MRN: [id number] DOB: 1951/05/06  Subjective: Kimberly Gross is a 70 y.o. year old female who is a primary care patient of [name], [place]. The care management team was consulted for assistance with disease management and care coordination needs.    Engaged with patient by telephone for follow up visit in response to provider referral for case management and/or care coordination services.   Consent to Services:  The patient was given information about Chronic Care Management services, agreed to services, and gave verbal consent prior to initiation of services.  Please see initial visit note for detailed documentation.   Patient agreed to services and verbal consent obtained.   Assessment: Review of patient past medical history, allergies, medications, health status, including review of consultants reports, laboratory and other test data, was performed as part of comprehensive evaluation and provision of chronic care management services.   SDOH (Social Determinants of Health) assessments and interventions performed:    CCM Care Plan  Allergies  Allergen Reactions   Chlorpromazine Rash    Outpatient Encounter Medications as of 03/04/2021  Medication Sig Note   ACCU-CHEK AVIVA PLUS test strip TEST BLOOD SUGAR TWICE DAILY    Accu-Chek Softclix Lancets lancets USE AS DIRECTED  TO CHECK BLOOD GLUCOSE    acetaminophen (TYLENOL) 500 MG tablet Take 1,000 mg by mouth 2 (two) times a day.    Alcohol Swabs (B-D SINGLE USE SWABS REGULAR) PADS Use as directed to check blood sugars twice daily    amLODipine (NORVASC) 10 MG tablet TAKE 1 TABLET BY MOUTH EVERY DAY    aspirin 81 MG tablet Take 81 mg by mouth daily.  05/08/2015: Received from: External Pharmacy Received Sig:    atorvastatin (LIPITOR) 40 MG tablet TAKE 1 TABLET BY MOUTH EVERYDAY AT BEDTIME    BIOTIN PO Take 1 tablet by mouth daily at 12 noon.    Blood  Glucose Monitoring Suppl (ACCU-CHEK AVIVA PLUS) w/Device KIT Check blood sugar twice a day    carvedilol (COREG) 25 MG tablet Take 1 tablet (25 mg total) by mouth 2 (two) times daily.    cholecalciferol (VITAMIN D3) 25 MCG (1000 UNIT) tablet Take 1,000 Units by mouth daily.    clonazePAM (KLONOPIN) 0.5 MG tablet Take 1 tablet (0.5 mg total) by mouth 2 (two) times daily.    divalproex (DEPAKOTE) 500 MG DR tablet 500 mg. 2 pills 2 times per day 05/08/2015: Received from: External Pharmacy   DM-APAP-CPM (CORICIDIN HBP PO) Take by mouth as needed.    fenofibrate 160 MG tablet TAKE 1 TABLET BY MOUTH EVERY DAY    ferrous sulfate 325 (65 FE) MG tablet Take 325 mg by mouth daily with breakfast.    furosemide (LASIX) 40 MG tablet TAKE 1 TABLET ON MONDAY, WEDNESDAY AND FRIDAY    hydrALAZINE (APRESOLINE) 100 MG tablet Take 1 tablet (100 mg total) by mouth 3 (three) times daily.    Insulin Pen Needle (DROPLET PEN NEEDLES) 32G X 4 MM MISC USE WITH XULTOPHY ONE TIME DAILY.    Multiple Vitamins-Minerals (MULTIVITAMIN ADULT PO) Use as directed 1 tablet in the mouth or throat daily. 05/08/2015: Received from: External Pharmacy Received Sig:    traZODone (DESYREL) 50 MG tablet     vitamin B-12 (CYANOCOBALAMIN) 250 MCG tablet Take 250 mcg by mouth daily.    XULTOPHY 100-3.6 UNIT-MG/ML SOPN INJECT 30 UNITS INTO THE SKIN DAILY.    No facility-administered  encounter medications on file as of 03/04/2021.    Patient Active Problem List   Diagnosis Date Noted   CKD (chronic kidney disease) stage 4, GFR 15-29 ml/min (HCC) 09/12/2020   Cough 09/10/2018   Type 2 diabetes mellitus without complication, without long-term current use of insulin ([place]) 05/14/2018   Essential hypertension 05/14/2018   Upper respiratory infection, viral 05/14/2018   Hyperlipidemia 05/05/2018   Morbid (severe) obesity due to excess calories ([place]) 01/22/2017   OSA (obstructive sleep apnea) 01/22/2017   Tremor 05/08/2015   Anxiety state  05/08/2015   Medication-induced postural tremor 05/08/2015   Bipolar 1 disorder, mixed, moderate ([place]) 05/08/2015    Conditions to be addressed/monitored: Type 2 diabetes mellitus, Essential hypertension, Mixed Hyperlipidemia, Decreased strength of lower extremity, stage 4 chronic kidney disease   Care Plan : Chronic Kidney (Adult)  Updates made by Lynne Logan, RN since 03/04/2021 12:00 AM     Problem: Disease Progression   Priority: High     Long-Range Goal: Disease Progression Prevented or Minimized   Start Date: 10/03/2020  Expected End Date: 10/03/2021  Recent Progress: On track  Priority: High  Note:   Current Barriers:  Ineffective Self Health Maintenance Clinical Goal(s):  Collaboration with [name], FNP regarding development and update of comprehensive plan of care as evidenced by provider attestation and co-signature Inter-disciplinary care team collaboration (see longitudinal plan of care) patient will work with care management team to address care coordination and chronic disease management needs related to Disease Management Educational Needs Care Coordination Medication Management and Education Psychosocial Support   Interventions:  03/04/21 completed successful outbound call with patient  Evaluation of current treatment plan related to   stage 4 chronic kidney disease  , self-management and patient's adherence to plan as established by provider. Collaboration with [name], FNP regarding development and update of comprehensive plan of care as evidenced by provider attestation       and co-signature Inter-disciplinary care team collaboration (see longitudinal plan of care) Provided education to patient about basic disease process for CKD Review of patient status, including review of consultants reports, relevant laboratory and other test results, and medications completed. Educated patient on dietary recommendations including salt substitutes and  importance of avoiding Ms. Dash due to elevated potassium levels Reviewed medications with patient and discussed importance of medication adherence Discussed plans with patient for ongoing care management follow up and provided patient with direct contact information for care management team Self Care Activities:  Continue to self administers medications as prescribed Continue to adhere to a low Sodium heart healthy diet  Attend all scheduled provider appointments Call pharmacy for medication refills Call provider office for new concerns or questions Patient Goals: - drink 6 to 8 glasses of water each day - follow MD recommendations for CKD   Follow Up Plan: Telephone follow up appointment with care management team member scheduled for: 05/30/21    Care Plan : Hypertension (Adult)  Updates made by Lynne Logan, RN since 03/04/2021 12:00 AM     Problem: Hypertension (Hypertension)   Priority: High     Long-Range Goal: Hypertension Monitored   Start Date: 10/03/2020  Expected End Date: 10/03/2021  Recent Progress: On track  Priority: High  Note:   Objective:  Last practice recorded BP readings:  BP Readings from Last 3 Encounters:  01/24/21 116/64  12/25/20 (!) 156/82  09/24/20 (!) 142/80   Most recent eGFR/CrCl:  Lab Results  Component Value Date   EGFR 26 (  L) 09/27/2020    No components found for: CRCL Current Barriers:  Knowledge Deficits related to basic understanding of hypertension pathophysiology and self care management Knowledge Deficits related to understanding of medications prescribed for management of hypertension Case Manager Clinical Goal(s):  patient will demonstrate improved adherence to prescribed treatment plan for hypertension as evidenced by taking all medications as prescribed, monitoring and recording blood pressure as directed, adhering to low sodium/DASH diet Interventions:  03/04/21 completed successful outbound call with patient  Collaboration  with [name], FNP regarding development and update of comprehensive plan of care as evidenced by provider attestation and co-signature Inter-disciplinary care team collaboration (see longitudinal plan of care) Evaluation of current treatment plan related to Hypertension and patient's adherence to plan as established by provider Review of patient status, including review of consultant's reports, relevant laboratory and other test results, and medications completed. Reviewed medications with patient and discussed importance of medication adherence Provided education to patient re: target BP <130/80; Educated on dietary and exercise recommendations Determined patient continues to be followed by the [facility]; Determined she is being followed by Velora Heckler RPH-CPP to assist with medication monitoring and dosage adjustments as needed to improve Hypertension  Reviewed recent [place] follow up completed on 02/27/21 with the following Assessment/Plan noted:  Blood pressure 116/64, pulse 72, resp. rate 16, height 5' 3" (1.6 m), weight 256 lb (116.1 kg), SpO2 93 %. Essential hypertension Patient with essential hypertension, finally has reached blood pressure goal.  Praised patient on adjusting her died to limit processed foods and focusing on medication compliance.  Reviewed dietary changes and encouraged that she continue with these and slowly work to further decrease processed foods.  Praised her adding more fruits and vegetables throughout the week.  Advised that she continue with current regimen.  She is scheduled to see Dr. Margaretann Loveless in 2 months, we will follow up with her in 4 months to help keep her on track.   Instructed patient to continue to monitor her BP at home 1-2 times daily and record readings; Instructed to notify the Pharm D and or Cardiology promptly of abnormally high or low readings  Reviewed and discussed ongoing follow up with [facility]MD q 3  MONTHS FOR HYPERTENSION.  Discussed plans with patient for ongoing care management follow up and provided patient with direct contact information for care management team Patient Self Care Activities:  Continue to self administers medications as prescribed Continue to monitor her BP at home 1-2 times daily and record readings Notify the Pharm D and or Cardiology promptly of abnormally high or low readings  Continue to adhere to a low Sodium heart healthy diet  Attend all scheduled provider appointments Call pharmacy for medication refills Call provider office for new concerns or questions Patient Goals: - check blood pressure 3 times per week - write blood pressure results in a log or diary  Follow Up Plan: Telephone follow up appointment with care management team member scheduled for: 05/30/21    Care Plan : Vitamin D deficiency  Updates made by Lynne Logan, RN since 03/04/2021 12:00 AM     Problem: Vitamin D deficiency   Priority: Medium     Long-Range Goal: Vitamin D deficiency - treatment optimized   Start Date: 10/03/2020  Expected End Date: 10/03/2021  Recent Progress: On track  Priority: Medium  Note:   Current Barriers:  Ineffective Self Health Maintenance Clinical Goal(s):  Collaboration with [name], FNP regarding development  and update of comprehensive plan of care as evidenced by provider attestation and co-signature Inter-disciplinary care team collaboration (see longitudinal plan of care) patient will work with care management team to address care coordination and chronic disease management needs related to Disease Management Educational Needs Care Coordination Medication Management and Education Psychosocial Support   Interventions:  03/04/21 completed successful outbound call with patient  Evaluation of current treatment plan related to  Vitamin D deficiency , self-management and patient's adherence to plan as established by provider. Collaboration  with [name], FNP regarding development and update of comprehensive plan of care as evidenced by provider attestation       and co-signature Inter-disciplinary care team collaboration (see longitudinal plan of care) Provided education to patient about basic disease process related to Vitamin D deficiency  Review of patient status, including review of consultants reports, relevant laboratory and other test results, and medications completed. Educated patient about dietary recommendations and the importance of getting natural sunlight at least 15 minutes daily when possible Reviewed medications with patient and discussed importance of medication adherence Discussed plans with patient for ongoing care management follow up and provided patient with direct contact information for care management team Self Care Activities:  Continue to self administers medications as prescribed Attend all scheduled provider appointments Call pharmacy for medication refills Call provider office for new concerns or questions Eat foods rich in Vitamin D as directed  Get at least 15 minutes of natural sunlight daily when possible   Patient Goals: - Add Vitamin D rich foods to diet - Get at least 15 minutes of natural sunlight when possible - Take Vitamin D supplement exactly as prescribed   Follow Up Plan: Telephone follow up appointment with care management team member scheduled for: 05/30/21    Plan:Telephone follow up appointment with care management team member scheduled for:  05/30/21  Barb Merino, RN, BSN, CCM Care Management Coordinator [place] [facility]  Direct Phone: [phone]

## 2021-03-11 NOTE — Patient Instructions (Signed)
Goals Addressed      Follow My Treatment Plan-Chronic Kidney   On track    Timeframe:  Long-Range Goal Priority:  High Start Date:  10/03/20                           Expected End Date:  10/03/21                     Follow Up Date: 05/30/21    - ask for help if I can't afford my medicines - call for medicine refill 2 or 3 days before it runs out - call the doctor or nurse before I stop taking medicine - call the doctor or nurse to get help with side effects - keep follow-up appointments - keep taking my medicines, even when I feel good - set alarm to remind of prescription refill date(s) - set alarm to remind to take medications    Why is this important?   Staying as healthy as you can is very important. This may mean making changes if you smoke, don't exercise or eat poorly.  A healthy lifestyle is an important goal for you.  Following the treatment plan and making changes may be hard.  Try some of these steps to help keep the disease from getting worse.     Notes:      Manage My Diet   On track    Timeframe:  Long-Range Goal Priority:  High Start Date:  10/03/20                          Expected End Date:  10/03/21                    Follow Up Date: 05/30/21    - ask for help if I have trouble affording healthy foods - choose foods low in fat and sugar - choose foods that are low in sodium (salt) - prepare or eat main meal at home 3 to 5 days each week - keep healthy snacks on hand - read food labels for sodium (salt), fat and sugar content - watch for swelling in feet, ankles and legs every day - weigh myself daily  - work with embedded BSW for resource needs    Why is this important?   A healthy diet is important for mental and physical health.  Healthy food helps repair damaged body tissue and maintains strong bones and muscles.  No single food is just right so eating a variety of proteins, fruits, vegetables and grains is best.  You may need to change what you eat  or drink to manage kidney disease.  A dietitian is the best person to guide you.     Notes:      Track and Manage My Blood Pressure-Hypertension   On track    Timeframe:  Long-Range Goal Priority:  High Start Date: 10/03/20                            Expected End Date: 10/03/21                     Follow Up Date: 05/30/21     Patient Self Care Activities:  -Continue to self administers medications as prescribed -Continue to monitor her BP at home 1-2 times daily and record readings -Notify the Pharm D  and or Cardiology promptly of abnormally high or low readings  -Continue to adhere to a low Sodium heart healthy diet  -Attend all scheduled provider appointments -Call pharmacy for medication refills -Call provider office for new concerns or questions   Why is this important?   You won't feel high blood pressure, but it can still hurt your blood vessels.  High blood pressure can cause heart or kidney problems. It can also cause a stroke.  Making lifestyle changes like losing a Kimberly Gross weight or eating less salt will help.  Checking your blood pressure at home and at different times of the day can help to control blood pressure.  If the doctor prescribes medicine remember to take it the way the doctor ordered.  Call the office if you cannot afford the medicine or if there are questions about it.     Notes:      Track and Manage My Symptoms-Chronic Kidney   On track    Timeframe:  Long-Range Goal Priority:  High Start Date: 10/03/20                            Expected End Date:  10/03/21                     Follow Up Date: 05/30/21    Self Care Activities:  Continue to self administers medications as prescribed Continue to adhere to a low Sodium heart healthy diet  Attend all scheduled provider appointments Call pharmacy for medication refills Call provider office for new concerns or questions - drink 6 to 8 glasses of water each day - follow MD recommdations for CKD   Why is  this important?   Keeping track of symptoms can help you feel the best. It also helps the doctor stay on top of any changes to the disease. It may also help keep your disease from getting worse.  Taking simple steps can help you cope with symptoms like feeling very tired or itchy skin.     Notes:      Vitamin D deficiency - treatment optimized   On track    Timeframe:  Long-Range Goal Priority:  Medium Start Date:  10/03/20                          Expected End Date:  10/03/21     Next Follow Up Date: 05/30/21  Self Care Activities:  Continue to self administers medications as prescribed Attend all scheduled provider appointments Call pharmacy for medication refills Call provider office for new concerns or questions Eat foods rich in Vitamin D as directed  Get at least 15 minutes of natural sunlight daily when possible

## 2021-03-20 NOTE — Chronic Care Management (AMB) (Signed)
  Chronic Care Management   Note  03/20/2021 Name: [name] MRN: [id number] DOB: 29-Sep-1950  Kimberly Gross is a 70 y.o. year old female who is a primary care patient of [name], [place]. YVONDA ARGENTIERI is currently enrolled in care management services. An additional referral for Pharmacy was placed.   Follow up plan: Face to Face appointment with care management team member scheduled for: 04/02/21  [place] [facility]  Direct Dial: [phone]

## 2021-03-26 ENCOUNTER — Telehealth: Payer: Self-pay

## 2021-03-26 NOTE — Chronic Care Management (AMB) (Signed)
Chronic Care Management Pharmacy Assistant   Name: Kimberly Gross  MRN: [id number] DOB: Nov 26, 1950  Reason for Encounter:Chart review for CPP visit on 04-02-2021   Conditions to be addressed/monitored: HTN, HLD, DMII, and CKD Stage 4  Recent office visits:  03-04-2021 Kimberly Logan, RN (CCM)  10-03-2020 Kimberly Logan, RN (CCM)  09-27-2020 Kimberly Simmering, LPN. Medicare annual wellness. Referral placed to [facility].  Recent consult visits:  01-24-2021 Kimberly Gross, RPH-CPP [facility]). Blood pressure follow up.  01-11-2021 Kimberly Course Forcucci, PA-C [facility]). Endoscopy procedure.  12-25-2020 Gross, Kimberly, RPH-CPP ([facility]). Blood pressure follow up.  09-24-2020 Kimberly Munroe, MD (Cardiology). EKG order placed.  Hospital visits:  None in previous 6 months  Medications: Outpatient Encounter Medications as of 03/26/2021  Medication Sig Note   ACCU-CHEK AVIVA PLUS test strip TEST BLOOD SUGAR TWICE DAILY    Accu-Chek Softclix Lancets lancets USE AS DIRECTED  TO CHECK BLOOD GLUCOSE    acetaminophen (TYLENOL) 500 MG tablet Take 1,000 mg by mouth 2 (two) times a day.    Alcohol Swabs (B-D SINGLE USE SWABS REGULAR) PADS Use as directed to check blood sugars twice daily    amLODipine (NORVASC) 10 MG tablet TAKE 1 TABLET BY MOUTH EVERY DAY    aspirin 81 MG tablet Take 81 mg by mouth daily.  05/08/2015: Received from: External Pharmacy Received Sig:    atorvastatin (LIPITOR) 40 MG tablet TAKE 1 TABLET BY MOUTH EVERYDAY AT BEDTIME    BIOTIN PO Take 1 tablet by mouth daily at 12 noon.    Blood Glucose Monitoring Suppl (ACCU-CHEK AVIVA PLUS) w/Device KIT Check blood sugar twice a day    carvedilol (COREG) 25 MG tablet Take 1 tablet (25 mg total) by mouth 2 (two) times daily.    cholecalciferol (VITAMIN D3) 25 MCG (1000 UNIT) tablet Take 1,000 Units by mouth daily.    clonazePAM (KLONOPIN) 0.5 MG tablet Take 1 tablet (0.5 mg total) by mouth 2  (two) times daily.    divalproex (DEPAKOTE) 500 MG DR tablet 500 mg. 2 pills 2 times per day 05/08/2015: Received from: External Pharmacy   DM-APAP-CPM (CORICIDIN HBP PO) Take by mouth as needed.    fenofibrate 160 MG tablet TAKE 1 TABLET BY MOUTH EVERY DAY    ferrous sulfate 325 (65 FE) MG tablet Take 325 mg by mouth daily with breakfast.    furosemide (LASIX) 40 MG tablet TAKE 1 TABLET ON MONDAY, WEDNESDAY AND FRIDAY    hydrALAZINE (APRESOLINE) 100 MG tablet Take 1 tablet (100 mg total) by mouth 3 (three) times daily.    Insulin Pen Needle (DROPLET PEN NEEDLES) 32G X 4 MM MISC USE WITH XULTOPHY ONE TIME DAILY.    Multiple Vitamins-Minerals (MULTIVITAMIN ADULT PO) Use as directed 1 tablet in the mouth or throat daily. 05/08/2015: Received from: External Pharmacy Received Sig:    traZODone (DESYREL) 50 MG tablet     vitamin B-12 (CYANOCOBALAMIN) 250 MCG tablet Take 250 mcg by mouth daily.    XULTOPHY 100-3.6 UNIT-MG/ML SOPN INJECT 30 UNITS INTO THE SKIN DAILY.    No facility-administered encounter medications on file as of 03/26/2021.   Have you seen any other providers since your last visit? Patient stated no.  Any changes in your medications or health? Patient stated no.  Any side effects from any medications? Patient stated no.  Do you have an symptoms or problems not managed by your medications? Patient stated no.  Any concerns about your  health right now? Patient stated no.  Has your provider asked that you check blood pressure, blood sugar, or follow special diet at home? Patient stated she checks blood pressure and blood sugar at home.  Do you get any type of exercise on a regular basis? Patient states she runs errands and walks outside in her neighborhood.  Can you think of a goal you would like to reach for your health? Patient states she would like to lose 20 pounds.  Do you have any problems getting your medications? Patient stated no but has to get Xultophy from [facility] when she  was getting medication through patient assistance. Called program to follow up and was informed that patient needs to re enroll and enrollment ended in 2021. Started application and emailed to [employer] PTM to mail.  Is there anything that you would like to discuss during the appointment? Patient states nothing she can think of.  Please bring medications and supplements to appointment. Patient is aware it will be a telephone appointment.   Care Gaps: Shingrix overdue Colonoscopy overdue Covid booster overdue PNA Vac overdue 6 month A1C overdue Last AWV 09-27-2020  Star Rating Drugs: Atorvastatin 40 mg- Last filled 02-17-2021 90 [facility] Xultophy 100-3.6 mg- Last filled 02-27-2021 50 DS [facility]  [place] Pharmacist Assistant [phone]

## 2021-03-29 ENCOUNTER — Ambulatory Visit (INDEPENDENT_AMBULATORY_CARE_PROVIDER_SITE_OTHER): Payer: Medicare (Managed Care) | Admitting: Internal Medicine

## 2021-03-29 ENCOUNTER — Encounter: Payer: Self-pay | Admitting: Internal Medicine

## 2021-03-29 ENCOUNTER — Other Ambulatory Visit: Payer: Self-pay

## 2021-03-29 VITALS — BP 112/62 | HR 77 | Resp 20 | Ht 63.0 in | Wt 265.6 lb

## 2021-03-29 DIAGNOSIS — N184 Chronic kidney disease, stage 4 (severe): Secondary | ICD-10-CM

## 2021-03-29 DIAGNOSIS — I10 Essential (primary) hypertension: Secondary | ICD-10-CM | POA: Diagnosis not present

## 2021-03-29 DIAGNOSIS — E119 Type 2 diabetes mellitus without complications: Secondary | ICD-10-CM

## 2021-03-29 DIAGNOSIS — G4733 Obstructive sleep apnea (adult) (pediatric): Secondary | ICD-10-CM

## 2021-03-29 DIAGNOSIS — E785 Hyperlipidemia, unspecified: Secondary | ICD-10-CM

## 2021-03-29 DIAGNOSIS — F411 Generalized anxiety disorder: Secondary | ICD-10-CM

## 2021-03-29 MED ORDER — FUROSEMIDE 20 MG PO TABS: 20.0000 mg | ORAL_TABLET | Freq: Every day | ORAL | 3 refills | Status: DC

## 2021-03-29 NOTE — Progress Notes (Signed)
Cardiology Office Note:    Date:  03/29/21  ID:  Kimberly Gross, DOB Aug 13, 1950, MRN [id number]  PCP:  [name], FNP  Cardiologist:  Elouise Munroe, MD  Electrophysiologist:  None   Referring MD: [name], FNP   Chief Complaint/Reason for Referral: HTN  History of Present Illness:    Kimberly Gross is a 70 y.o. female with a history of hypertension, diabetes, chronic kidney disease stage IV, bipolar 1 disorder. Follow up today for HTN.   Doing well from a symptom standpoint.  Chronic stable shortness of breath and she feels her CPAP machine has not been working for the past 3 months.  We discussed visiting her medical supply store if it is an issue with equipment, she is going to visit them or call in the next several days.  Denies chest pain, palpitations.  Significant life stressors with social situation, but she does not feel this affects her blood pressure management.  Blood pressure is very well controlled today, this has been a significant struggle over the years.  She has adjusted her diet and is on a stable regimen of medications.  She is taking Lasix 20 mg daily for lower extremity swelling.  Last seen by our office pharmacist in July and was doing well at that time as well.  The patient denies chest pain, chest pressure, dyspnea at rest, palpitations, PND, orthopnea, or leg swelling. Denies cough, fever, chills. Denies nausea, vomiting. Denies syncope or presyncope. Denies dizziness or lightheadedness.   Past Medical History:  Diagnosis Date   Anemia    Anxiety    Bipolar 1 disorder ([place])    CKD (chronic kidney disease), stage IV (HCC)    Depression    Diabetes mellitus without complication (HCC)    Hyperlipemia    Hypertension    Occasional tremors     Past Surgical History:  Procedure Laterality Date   CATARACT EXTRACTION, BILATERAL  2021   VAGINAL HYSTERECTOMY      Current Medications: Current Meds  Medication Sig   ACCU-CHEK AVIVA PLUS test  strip TEST BLOOD SUGAR TWICE DAILY   Accu-Chek Softclix Lancets lancets USE AS DIRECTED  TO CHECK BLOOD GLUCOSE   acetaminophen (TYLENOL) 500 MG tablet Take 1,000 mg by mouth 2 (two) times a day.   Alcohol Swabs (B-D SINGLE USE SWABS REGULAR) PADS Use as directed to check blood sugars twice daily   amLODipine (NORVASC) 10 MG tablet TAKE 1 TABLET BY MOUTH EVERY DAY   aspirin 81 MG tablet Take 81 mg by mouth daily.    atorvastatin (LIPITOR) 40 MG tablet TAKE 1 TABLET BY MOUTH EVERYDAY AT BEDTIME   BIOTIN PO Take 1 tablet by mouth daily at 12 noon.   Blood Glucose Monitoring Suppl (ACCU-CHEK AVIVA PLUS) w/Device KIT Check blood sugar twice a day   carvedilol (COREG) 25 MG tablet Take 1 tablet (25 mg total) by mouth 2 (two) times daily.   cholecalciferol (VITAMIN D3) 25 MCG (1000 UNIT) tablet Take 1,000 Units by mouth daily.   clonazePAM (KLONOPIN) 0.5 MG tablet Take 1 tablet (0.5 mg total) by mouth 2 (two) times daily.   divalproex (DEPAKOTE) 500 MG DR tablet 500 mg. 2 pills 2 times per day   DM-APAP-CPM (CORICIDIN HBP PO) Take by mouth as needed.   fenofibrate 160 MG tablet TAKE 1 TABLET BY MOUTH EVERY DAY   ferrous sulfate 325 (65 FE) MG tablet Take 325 mg by mouth daily with breakfast.   hydrALAZINE (APRESOLINE) 100  MG tablet Take 1 tablet (100 mg total) by mouth 3 (three) times daily.   Insulin Pen Needle (DROPLET PEN NEEDLES) 32G X 4 MM MISC USE WITH XULTOPHY ONE TIME DAILY.   Multiple Vitamins-Minerals (MULTIVITAMIN ADULT PO) Use as directed 1 tablet in the mouth or throat daily.   traZODone (DESYREL) 50 MG tablet    vitamin B-12 (CYANOCOBALAMIN) 250 MCG tablet Take 250 mcg by mouth daily.   XULTOPHY 100-3.6 UNIT-MG/ML SOPN INJECT 30 UNITS INTO THE SKIN DAILY.   [DISCONTINUED] furosemide (LASIX) 40 MG tablet TAKE 1 TABLET ON MONDAY, WEDNESDAY AND FRIDAY (Patient taking differently: 20 mg daily. TAKE 1 TABLET ON MONDAY, WEDNESDAY AND FRIDAY)     Allergies:   Chlorpromazine   Social  History   Tobacco Use   Smoking status: Never   Smokeless tobacco: Never  Vaping Use   Vaping Use: Never used  Substance Use Topics   Alcohol use: No   Drug use: No     Family History: The patient's family history includes Heart attack in her father; Hypertension in her mother. There is no history of Breast cancer.  ROS:   Please see the history of present illness.    All other systems reviewed and are negative.  EKGs/Labs/Other Studies Reviewed:    The following studies were reviewed today:  EKG: 03/29/2021: Sinus rhythm with first-degree AV block PR interval 230 ms, incomplete right bundle branch block, inferior and anterolateral infarct pattern.  Not significantly changed from the prior on side-by-side comparison.  Last visit: SR with first deg AVB, RBBB and LAFB  Recent Labs: 09/12/2020: ALT 27; Hemoglobin 11.7; Platelets 298 09/27/2020: BUN 31; Creatinine, Ser 2.03; Potassium 5.7; Sodium 144  Recent Lipid Panel    Component Value Date/Time   CHOL 137 09/12/2020 1320   TRIG 130 09/12/2020 1320   HDL 44 09/12/2020 1320   CHOLHDL 3.1 09/12/2020 1320   CHOLHDL 2.7 08/23/2010 2238   VLDL 12 08/23/2010 2238   LDLCALC 70 09/12/2020 1320    Physical Exam:    VS:  BP 112/62 (BP Location: Left Arm, Patient Position: Sitting, Cuff Size: Large)   Pulse 77   Resp 20   Ht $R'5\' 3"'dz$  (1.6 m)   Wt 265 lb 9.6 oz (120.5 kg)   BMI 47.05 kg/m     Wt Readings from Last 5 Encounters:  03/29/21 265 lb 9.6 oz (120.5 kg)  01/24/21 256 lb (116.1 kg)  12/25/20 262 lb 6.4 oz (119 kg)  09/27/20 262 lb (118.8 kg)  09/24/20 263 lb (119.3 kg)    Constitutional: No acute distress Eyes: sclera non-icteric, normal conjunctiva and lids ENMT: normal dentition, moist mucous membranes Cardiovascular: regular rhythm, normal rate, no murmurs. S1 and S2 normal. Radial pulses normal bilaterally. No jugular venous distention.  Respiratory: clear to auscultation bilaterally GI : normal bowel sounds,  soft and nontender. No distention.   MSK: extremities warm, well perfused. No edema.  NEURO: grossly nonfocal exam, moves all extremities. PSYCH: alert and oriented x 3, normal mood and affect.   ASSESSMENT:    1. Essential hypertension   2. Hyperlipidemia, unspecified hyperlipidemia type   3. OSA (obstructive sleep apnea)   4. Type 2 diabetes mellitus without complication, without long-term current use of insulin ([place])   5. CKD (chronic kidney disease) stage 4, GFR 15-29 ml/min (HCC)   6. Anxiety state     PLAN:    Essential hypertension - Plan: EKG 12-Lead - BP has improved. -Continue amlodipine 10 mg  daily, carvedilol 25 mg twice daily, Lasix 20 mg daily, hydralazine 100 mg 3 times daily.  No symptoms of hypotension, blood pressure well controlled and she is also followed by nephrology.  Hyperlipidemia, unspecified hyperlipidemia type - lipids greatly improved on last check in March, LDL 70, Trig 130 down from 228. Continue Atorvastatin 40 mg daily.   OSA-untreated for the last several months due to some difficulty with her CPAP and needing a new 1.  I have asked her to visit with her medical supply store.  CKD-follows regularly with Dr. Moshe Cipro in nephrology.  Social stressors and anxiety-overall well managed with Bible study, also has a prescription for anxiolytics per PCP.  Recommended nonmedication options for stress management.  Exercise recommendations: Goal of exercising for at least 30 minutes a day, at least 5 times per week.  Please exercise to a moderate exertion.  This means that while exercising it is difficult to speak in full sentences, however you are not so short of breath that you feel you must stop, and not so comfortable that you can carry on a full conversation.  Exertion level should be approximately a 5/10, if 10 is the most exertion you can perform.  Diet recommendations: Recommend a heart healthy diet such as the Mediterranean diet.  This diet  consists of plant based foods, healthy fats, lean meats, olive oil.  It suggests limiting the intake of simple carbohydrates such as white breads, pastries, and pastas.  It also limits the amount of red meat, wine, and dairy products such as cheese that one should consume on a daily basis.  Total time of encounter: 30 minutes total time of encounter, including 24 minutes spent in face-to-face patient care on the date of this encounter. This time includes coordination of care and counseling regarding above mentioned problem list. Remainder of non-face-to-face time involved reviewing chart documents/testing relevant to the patient encounter and documentation in the medical record. I have independently reviewed documentation from referring provider.   Cherlynn Kaiser, MD, [facility]     Medication Adjustments/Labs and Tests Ordered: Current medicines are reviewed at length with the patient today.  Concerns regarding medicines are outlined above.   Orders Placed This Encounter  Procedures   EKG 12-Lead      Meds ordered this encounter  Medications   furosemide (LASIX) 20 MG tablet    Sig: Take 1 tablet (20 mg total) by mouth daily. Take one tablet daily    Dispense:  90 tablet    Refill:  3     Patient Instructions  Medication Instructions:  No Changes In Medications at this time.  *If you need a refill on your cardiac medications before your next appointment, please call your pharmacy*  Follow-Up: At [facility], you and your health needs are our priority.  As part of our continuing mission to provide you with exceptional heart care, we have created designated Provider Care Teams.  These Care Teams include your primary Cardiologist (physician) and Advanced Practice Providers (APPs -  Physician Assistants and Nurse Practitioners) who all work together to provide you with the care you need, when you need it.  We recommend signing up for the patient portal  called "MyChart".  Sign up information is provided on this After Visit Summary.  MyChart is used to connect with patients for Virtual Visits (Telemedicine).  Patients are able to view lab/test results, encounter notes, upcoming appointments, etc.  Non-urgent messages can be sent to your provider as  well.   To learn more about what you can do with MyChart, go to [email or web address].    Your next appointment:   6 month(s)  The format for your next appointment:   In Person  Provider:   You may see Elouise Munroe, MD or one of the following Advanced Practice Providers on your designated Care Team:   Rosaria Ferries, PA-C Caron Presume, PA-C Jory Sims, DNP, ANP

## 2021-03-29 NOTE — Patient Instructions (Signed)
Medication Instructions:  No Changes In Medications at this time.  *If you need a refill on your cardiac medications before your next appointment, please call your pharmacy*  Follow-Up: At [facility], you and your health needs are our priority.  As part of our continuing mission to provide you with exceptional heart care, we have created designated Provider Care Teams.  These Care Teams include your primary Cardiologist (physician) and Advanced Practice Providers (APPs -  Physician Assistants and Nurse Practitioners) who all work together to provide you with the care you need, when you need it.  We recommend signing up for the patient portal called "MyChart".  Sign up information is provided on this After Visit Summary.  MyChart is used to connect with patients for Virtual Visits (Telemedicine).  Patients are able to view lab/test results, encounter notes, upcoming appointments, etc.  Non-urgent messages can be sent to your provider as well.   To learn more about what you can do with MyChart, go to [email or web address].    Your next appointment:   6 month(s)  The format for your next appointment:   In Person  Provider:   You may see Elouise Munroe, MD or one of the following Advanced Practice Providers on your designated Care Team:   Rosaria Ferries, PA-C Caron Presume, PA-C Jory Sims, DNP, ANP

## 2021-04-02 ENCOUNTER — Telehealth: Payer: Self-pay

## 2021-04-02 ENCOUNTER — Ambulatory Visit: Payer: Medicare (Managed Care)

## 2021-04-02 NOTE — Telephone Encounter (Signed)
  Pharmacist Chronic Care Management  Follow Up Note   04/02/2021 Name: Kimberly Gross MRN: [id number] DOB: [birth year]   Referred by: [name], FNP Reason for referral : No chief complaint on file.   An unsuccessful telephone outreach was attempted today. The patient was referred to the case management team for assistance with care management and care coordination.   Follow Up Plan: The patient has been provided with contact information for the care management team and has been advised to call with any health related questions or concerns.  Patients phone is not ringing but goes to voicemail after about 10 seconds, left a voicemail with my contact information   Orlando Penner, PharmD Clinical Pharmacist [facility] [phone]

## 2021-04-02 NOTE — Telephone Encounter (Signed)
I called pt and left her a vm to call the office. She needs a DM f/u. Tyler Deis

## 2021-04-09 ENCOUNTER — Telehealth: Payer: Self-pay | Admitting: *Deleted

## 2021-04-09 ENCOUNTER — Other Ambulatory Visit: Payer: Self-pay

## 2021-04-09 ENCOUNTER — Ambulatory Visit
Admission: RE | Admit: 2021-04-09 | Discharge: 2021-04-09 | Disposition: A | Discharge status: Home / Self Care | Payer: Medicare (Managed Care) | Source: Ambulatory Visit | Attending: Nurse Practitioner | Admitting: Nurse Practitioner

## 2021-04-09 DIAGNOSIS — Z1231 Encounter for screening mammogram for malignant neoplasm of breast: Secondary | ICD-10-CM

## 2021-04-09 IMAGING — MG MM DIGITAL SCREENING BILAT W/ TOMO AND CAD
6 of 12 series · 6 of 36 positions shown · non-contrast
Comparison: Previous exam(s).

CLINICAL DATA: Screening.

EXAM:
DIGITAL SCREENING BILATERAL MAMMOGRAM WITH TOMOSYNTHESIS AND CAD
TECHNIQUE: Bilateral screening digital craniocaudal and mediolateral oblique
mammograms were obtained. Bilateral screening digital breast
tomosynthesis was performed. The images were evaluated with
computer-aided detection.

[L MLO synth-2D (1 of 2)]
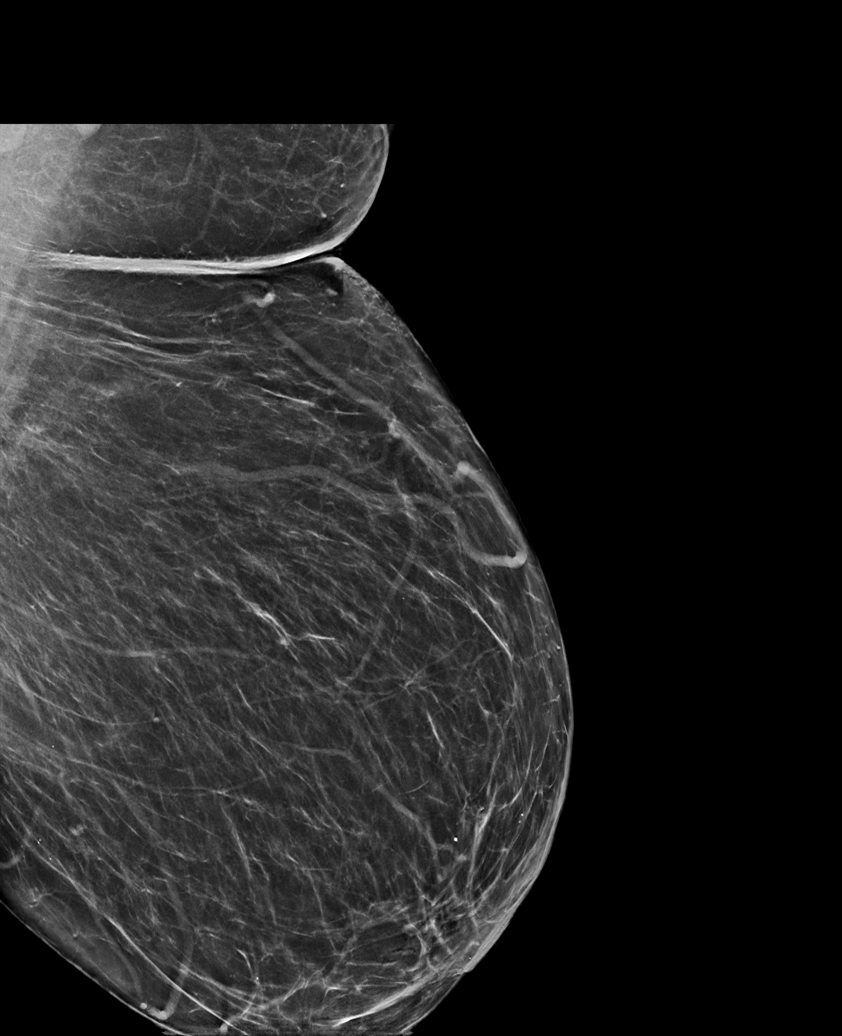

[L MLO synth-2D (2 of 2)]
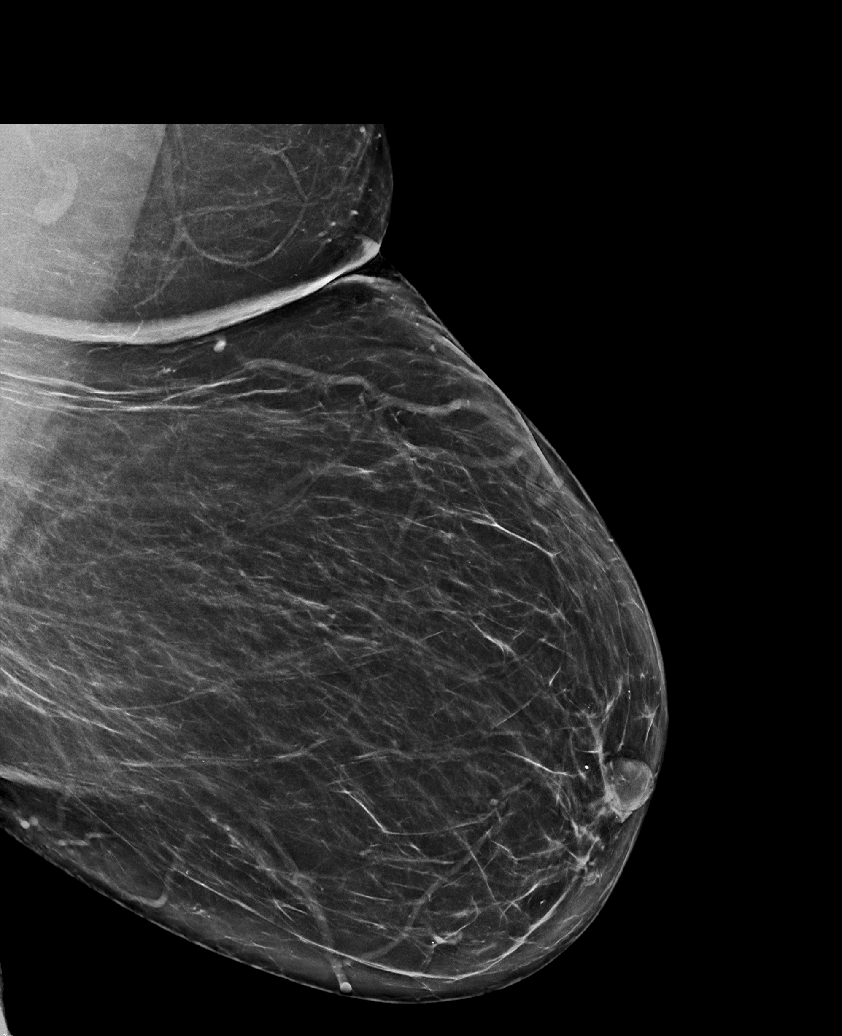

[R CC synth-2D]
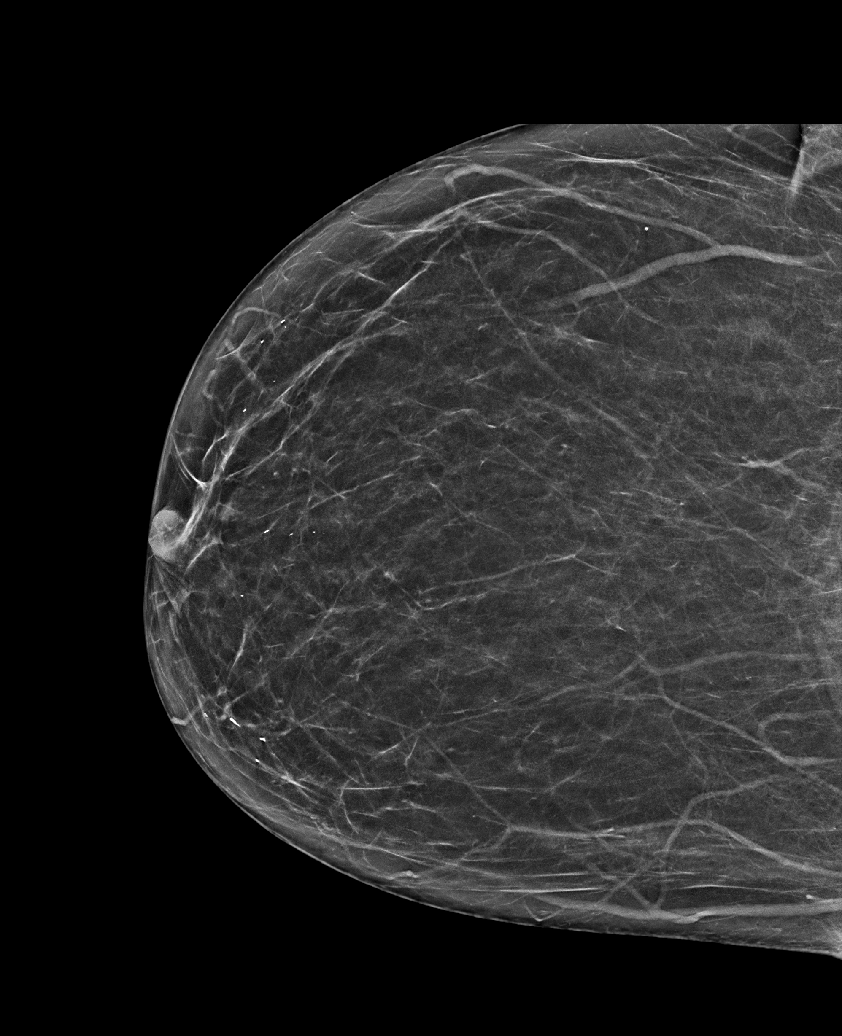

[R MLO synth-2D (1 of 2)]
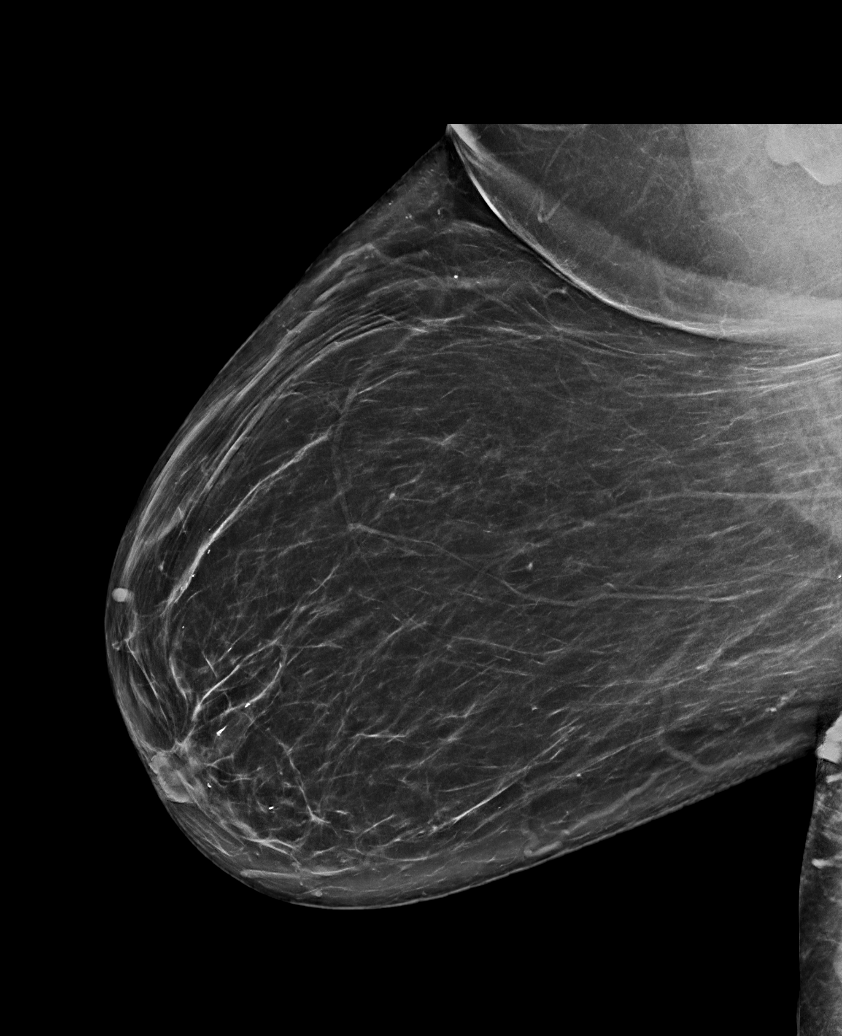

[L CC synth-2D]
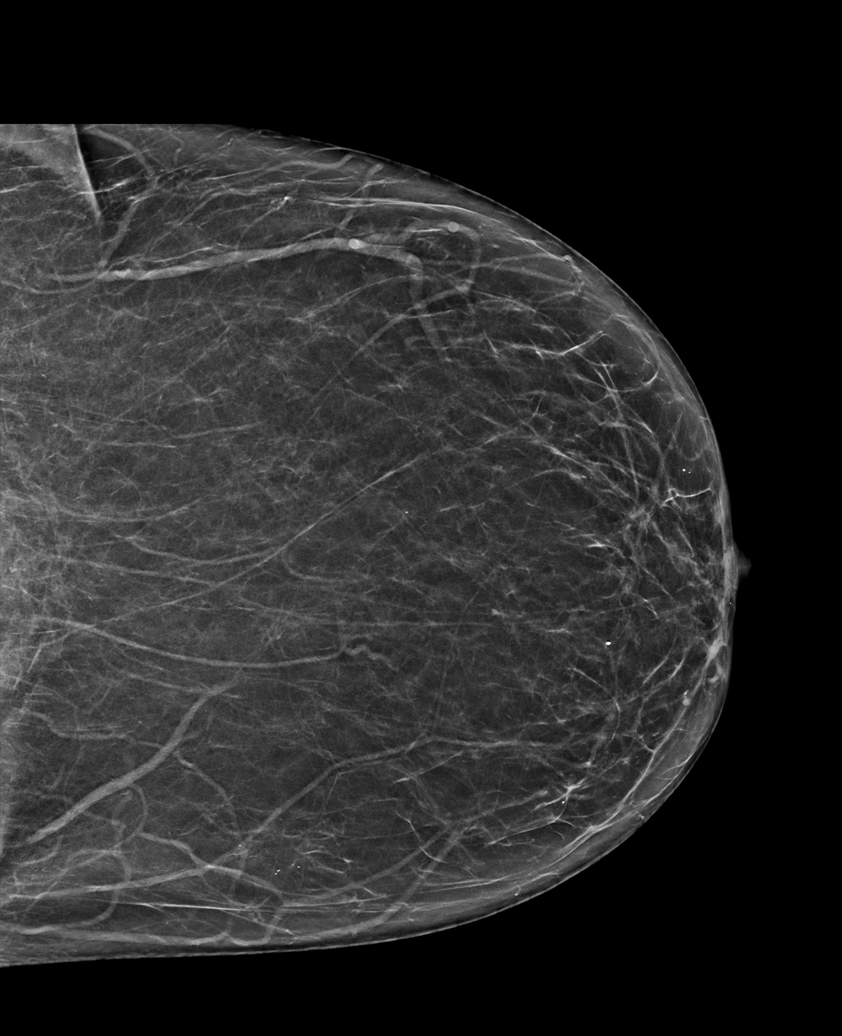

[R MLO synth-2D (2 of 2)]
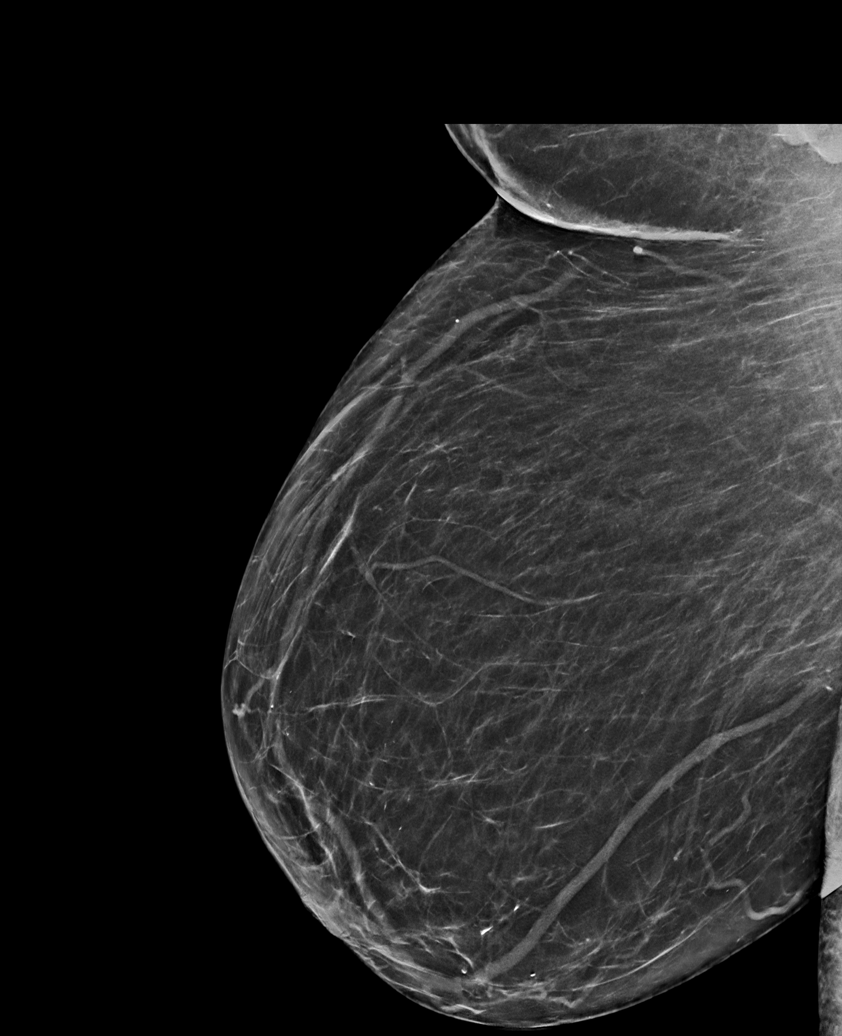

[6 of 36 positions shown; findings below may reference images not displayed]

ACR Breast Density Category b: There are scattered areas of
fibroglandular density.
FINDINGS: There are no findings suspicious for malignancy.
IMPRESSION: No mammographic evidence of malignancy. A result letter of this
screening mammogram will be mailed directly to the patient.

RECOMMENDATION:
Screening mammogram in one year. (Code:[BY])

BI-RADS CATEGORY  1: Negative.

## 2021-04-09 NOTE — Chronic Care Management (AMB) (Signed)
  Care Management   Note  04/09/2021 Name: Kimberly Gross MRN: [id number] DOB: Jan 05, 1951  Kimberly Gross is a 70 y.o. year old female who is a primary care patient of [name], [place] and is actively engaged with the care management team. I reached out to [name] by phone today to assist with re-scheduling an initial visit with the Pharmacist  Follow up plan: Unsuccessful telephone outreach attempt made. A HIPAA compliant phone message was left for the patient providing contact information and requesting a return call.  The care management team will reach out to the patient again over the next 7 days.  If patient returns call to provider office, please advise to call [place]  at [phone].  [place] Management  Direct Dial: [phone]

## 2021-04-13 ENCOUNTER — Other Ambulatory Visit: Payer: Self-pay | Admitting: Internal Medicine

## 2021-04-16 NOTE — Chronic Care Management (AMB) (Signed)
  Care Management   Note  04/16/2021 Name: Kimberly Gross MRN: [id number] DOB: [birth year]  Kimberly Gross is a 70 y.o. year old female who is a primary care patient of [name], [place] and is actively engaged with the care management team. I reached out to [name] by phone today to assist with re-scheduling an initial visit with the Pharmacist  Follow up plan: Unsuccessful telephone outreach attempt made. A HIPAA compliant phone message was left for the patient providing contact information and requesting a return call.  The care management team will reach out to the patient again over the next 7 days.  If patient returns call to provider office, please advise to call [place] at [phone].  [place] Management  Direct Dial: [phone]

## 2021-04-23 NOTE — Chronic Care Management (AMB) (Signed)
  Care Management   Note  04/23/2021 Name: Kimberly Gross MRN: [id number] DOB: 04-03-1951  Kimberly Gross is a 70 y.o. year old female who is a primary care patient of [name], [place] and is actively engaged with the care management team. I reached out to [name] by phone today to assist with re-scheduling an initial visit with the Pharmacist  Follow up plan: A third unsuccessful telephone outreach attempt made. A HIPAA compliant phone message was left for the patient providing contact information and requesting a return call. Unable to make contact on outreach attempts x 3. PCP [name], FNP notified via routed documentation in medical record. We have been unable to make contact with the patient for follow up. The care management team is available to follow up with the patient after provider conversation with the patient regarding recommendation for care management engagement and subsequent re-referral to the care management team. If patient returns call to provider office, please advise to call [place] at [phone].  [place] Management  Direct Dial: [phone]

## 2021-05-13 ENCOUNTER — Emergency Department (HOSPITAL_COMMUNITY): Payer: Medicare (Managed Care)

## 2021-05-13 ENCOUNTER — Other Ambulatory Visit: Payer: Self-pay

## 2021-05-13 ENCOUNTER — Inpatient Hospital Stay (HOSPITAL_COMMUNITY)
Admission: EM | Admit: 2021-05-13 | Discharge: 2021-06-15 | DRG: 004 | Disposition: A | Discharge status: Hospice | Payer: Medicare (Managed Care) | Attending: Internal Medicine | Admitting: Internal Medicine

## 2021-05-13 DIAGNOSIS — Z888 Allergy status to other drugs, medicaments and biological substances status: Secondary | ICD-10-CM

## 2021-05-13 DIAGNOSIS — Z9911 Dependence on respirator [ventilator] status: Secondary | ICD-10-CM

## 2021-05-13 DIAGNOSIS — F419 Anxiety disorder, unspecified: Secondary | ICD-10-CM | POA: Diagnosis present

## 2021-05-13 DIAGNOSIS — J690 Pneumonitis due to inhalation of food and vomit: Secondary | ICD-10-CM | POA: Diagnosis present

## 2021-05-13 DIAGNOSIS — I700 Atherosclerosis of aorta: Secondary | ICD-10-CM | POA: Diagnosis present

## 2021-05-13 DIAGNOSIS — E878 Other disorders of electrolyte and fluid balance, not elsewhere classified: Secondary | ICD-10-CM | POA: Diagnosis not present

## 2021-05-13 DIAGNOSIS — N184 Chronic kidney disease, stage 4 (severe): Secondary | ICD-10-CM

## 2021-05-13 DIAGNOSIS — M24559 Contracture, unspecified hip: Secondary | ICD-10-CM

## 2021-05-13 DIAGNOSIS — Z79891 Long term (current) use of opiate analgesic: Secondary | ICD-10-CM

## 2021-05-13 DIAGNOSIS — E871 Hypo-osmolality and hyponatremia: Secondary | ICD-10-CM | POA: Diagnosis present

## 2021-05-13 DIAGNOSIS — L89322 Pressure ulcer of left buttock, stage 2: Secondary | ICD-10-CM | POA: Diagnosis not present

## 2021-05-13 DIAGNOSIS — Z515 Encounter for palliative care: Secondary | ICD-10-CM

## 2021-05-13 DIAGNOSIS — I10 Essential (primary) hypertension: Secondary | ICD-10-CM | POA: Diagnosis present

## 2021-05-13 DIAGNOSIS — Z6841 Body Mass Index (BMI) 40.0 and over, adult: Secondary | ICD-10-CM | POA: Diagnosis not present

## 2021-05-13 DIAGNOSIS — G4733 Obstructive sleep apnea (adult) (pediatric): Secondary | ICD-10-CM | POA: Diagnosis present

## 2021-05-13 DIAGNOSIS — J9601 Acute respiratory failure with hypoxia: Secondary | ICD-10-CM | POA: Diagnosis not present

## 2021-05-13 DIAGNOSIS — Z794 Long term (current) use of insulin: Secondary | ICD-10-CM

## 2021-05-13 DIAGNOSIS — R7881 Bacteremia: Secondary | ICD-10-CM | POA: Diagnosis not present

## 2021-05-13 DIAGNOSIS — E162 Hypoglycemia, unspecified: Secondary | ICD-10-CM

## 2021-05-13 DIAGNOSIS — J9621 Acute and chronic respiratory failure with hypoxia: Secondary | ICD-10-CM | POA: Diagnosis not present

## 2021-05-13 DIAGNOSIS — N179 Acute kidney failure, unspecified: Secondary | ICD-10-CM | POA: Diagnosis not present

## 2021-05-13 DIAGNOSIS — Z7985 Long-term (current) use of injectable non-insulin antidiabetic drugs: Secondary | ICD-10-CM

## 2021-05-13 DIAGNOSIS — J9602 Acute respiratory failure with hypercapnia: Secondary | ICD-10-CM | POA: Diagnosis present

## 2021-05-13 DIAGNOSIS — N1832 Chronic kidney disease, stage 3b: Secondary | ICD-10-CM | POA: Diagnosis present

## 2021-05-13 DIAGNOSIS — Z789 Other specified health status: Secondary | ICD-10-CM | POA: Diagnosis not present

## 2021-05-13 DIAGNOSIS — Z7189 Other specified counseling: Secondary | ICD-10-CM | POA: Diagnosis not present

## 2021-05-13 DIAGNOSIS — N183 Chronic kidney disease, stage 3 unspecified: Secondary | ICD-10-CM | POA: Diagnosis not present

## 2021-05-13 DIAGNOSIS — L8990 Pressure ulcer of unspecified site, unspecified stage: Secondary | ICD-10-CM | POA: Diagnosis not present

## 2021-05-13 DIAGNOSIS — M6282 Rhabdomyolysis: Secondary | ICD-10-CM | POA: Diagnosis present

## 2021-05-13 DIAGNOSIS — R0602 Shortness of breath: Secondary | ICD-10-CM

## 2021-05-13 DIAGNOSIS — E872 Acidosis, unspecified: Secondary | ICD-10-CM | POA: Diagnosis present

## 2021-05-13 DIAGNOSIS — J9509 Other tracheostomy complication: Secondary | ICD-10-CM | POA: Diagnosis not present

## 2021-05-13 DIAGNOSIS — G9340 Encephalopathy, unspecified: Secondary | ICD-10-CM

## 2021-05-13 DIAGNOSIS — R52 Pain, unspecified: Secondary | ICD-10-CM

## 2021-05-13 DIAGNOSIS — G9341 Metabolic encephalopathy: Secondary | ICD-10-CM

## 2021-05-13 DIAGNOSIS — R4182 Altered mental status, unspecified: Secondary | ICD-10-CM | POA: Diagnosis not present

## 2021-05-13 DIAGNOSIS — D631 Anemia in chronic kidney disease: Secondary | ICD-10-CM | POA: Diagnosis present

## 2021-05-13 DIAGNOSIS — R609 Edema, unspecified: Secondary | ICD-10-CM | POA: Diagnosis not present

## 2021-05-13 DIAGNOSIS — F3162 Bipolar disorder, current episode mixed, moderate: Secondary | ICD-10-CM | POA: Diagnosis present

## 2021-05-13 DIAGNOSIS — D6489 Other specified anemias: Secondary | ICD-10-CM | POA: Diagnosis present

## 2021-05-13 DIAGNOSIS — L89302 Pressure ulcer of unspecified buttock, stage 2: Secondary | ICD-10-CM | POA: Diagnosis not present

## 2021-05-13 DIAGNOSIS — R403 Persistent vegetative state: Secondary | ICD-10-CM | POA: Diagnosis present

## 2021-05-13 DIAGNOSIS — Z79899 Other long term (current) drug therapy: Secondary | ICD-10-CM

## 2021-05-13 DIAGNOSIS — Z20822 Contact with and (suspected) exposure to covid-19: Secondary | ICD-10-CM | POA: Diagnosis present

## 2021-05-13 DIAGNOSIS — Z930 Tracheostomy status: Secondary | ICD-10-CM | POA: Diagnosis not present

## 2021-05-13 DIAGNOSIS — Z66 Do not resuscitate: Secondary | ICD-10-CM | POA: Diagnosis not present

## 2021-05-13 DIAGNOSIS — Z8249 Family history of ischemic heart disease and other diseases of the circulatory system: Secondary | ICD-10-CM

## 2021-05-13 DIAGNOSIS — B957 Other staphylococcus as the cause of diseases classified elsewhere: Secondary | ICD-10-CM | POA: Diagnosis present

## 2021-05-13 DIAGNOSIS — T8029XA Infection following other infusion, transfusion and therapeutic injection, initial encounter: Secondary | ICD-10-CM | POA: Diagnosis not present

## 2021-05-13 DIAGNOSIS — I129 Hypertensive chronic kidney disease with stage 1 through stage 4 chronic kidney disease, or unspecified chronic kidney disease: Secondary | ICD-10-CM | POA: Diagnosis present

## 2021-05-13 DIAGNOSIS — J9600 Acute respiratory failure, unspecified whether with hypoxia or hypercapnia: Secondary | ICD-10-CM | POA: Diagnosis not present

## 2021-05-13 DIAGNOSIS — R0609 Other forms of dyspnea: Secondary | ICD-10-CM | POA: Diagnosis not present

## 2021-05-13 DIAGNOSIS — E1165 Type 2 diabetes mellitus with hyperglycemia: Secondary | ICD-10-CM | POA: Diagnosis not present

## 2021-05-13 DIAGNOSIS — E11641 Type 2 diabetes mellitus with hypoglycemia with coma: Secondary | ICD-10-CM | POA: Diagnosis present

## 2021-05-13 DIAGNOSIS — Z4659 Encounter for fitting and adjustment of other gastrointestinal appliance and device: Secondary | ICD-10-CM

## 2021-05-13 DIAGNOSIS — R197 Diarrhea, unspecified: Secondary | ICD-10-CM | POA: Diagnosis not present

## 2021-05-13 DIAGNOSIS — T8172XA Complication of vein following a procedure, not elsewhere classified, initial encounter: Secondary | ICD-10-CM | POA: Diagnosis not present

## 2021-05-13 DIAGNOSIS — E875 Hyperkalemia: Secondary | ICD-10-CM | POA: Diagnosis not present

## 2021-05-13 DIAGNOSIS — J189 Pneumonia, unspecified organism: Secondary | ICD-10-CM

## 2021-05-13 DIAGNOSIS — R509 Fever, unspecified: Secondary | ICD-10-CM | POA: Diagnosis not present

## 2021-05-13 DIAGNOSIS — E1122 Type 2 diabetes mellitus with diabetic chronic kidney disease: Secondary | ICD-10-CM | POA: Diagnosis present

## 2021-05-13 DIAGNOSIS — Z90710 Acquired absence of both cervix and uterus: Secondary | ICD-10-CM

## 2021-05-13 DIAGNOSIS — L89892 Pressure ulcer of other site, stage 2: Secondary | ICD-10-CM | POA: Diagnosis not present

## 2021-05-13 DIAGNOSIS — E119 Type 2 diabetes mellitus without complications: Secondary | ICD-10-CM

## 2021-05-13 DIAGNOSIS — S062XAA Diffuse traumatic brain injury with loss of consciousness status unknown, initial encounter: Secondary | ICD-10-CM | POA: Diagnosis present

## 2021-05-13 DIAGNOSIS — K117 Disturbances of salivary secretion: Secondary | ICD-10-CM | POA: Diagnosis not present

## 2021-05-13 DIAGNOSIS — R1310 Dysphagia, unspecified: Secondary | ICD-10-CM | POA: Diagnosis present

## 2021-05-13 DIAGNOSIS — I495 Sick sinus syndrome: Secondary | ICD-10-CM | POA: Diagnosis not present

## 2021-05-13 DIAGNOSIS — E785 Hyperlipidemia, unspecified: Secondary | ICD-10-CM | POA: Diagnosis present

## 2021-05-13 DIAGNOSIS — Z7982 Long term (current) use of aspirin: Secondary | ICD-10-CM

## 2021-05-13 LAB — CBC WITH DIFFERENTIAL/PLATELET
Abs Immature Granulocytes: 0.02 10*3/uL (ref 0.00–0.07)
Basophils Absolute: 0 10*3/uL (ref 0.0–0.1)
Basophils Relative: 1 %
Eosinophils Absolute: 0.1 10*3/uL (ref 0.0–0.5)
Eosinophils Relative: 2 %
HCT: 42.7 % (ref 36.0–46.0)
Hemoglobin: 13.2 g/dL (ref 12.0–15.0)
Immature Granulocytes: 0 %
Lymphocytes Relative: 15 %
Lymphs Abs: 0.9 10*3/uL (ref 0.7–4.0)
MCH: 29.3 pg (ref 26.0–34.0)
MCHC: 30.9 g/dL (ref 30.0–36.0)
MCV: 94.7 fL (ref 80.0–100.0)
Monocytes Absolute: 0.6 10*3/uL (ref 0.1–1.0)
Monocytes Relative: 9 %
Neutro Abs: 4.5 10*3/uL (ref 1.7–7.7)
Neutrophils Relative %: 73 %
Platelets: 301 10*3/uL (ref 150–400)
RBC: 4.51 MIL/uL (ref 3.87–5.11)
RDW: 12 % (ref 11.5–15.5)
WBC: 6.1 10*3/uL (ref 4.0–10.5)
nRBC: 0 % (ref 0.0–0.2)

## 2021-05-13 LAB — I-STAT ARTERIAL BLOOD GAS, ED
Acid-Base Excess: 6 mmol/L — ABNORMAL HIGH (ref 0.0–2.0)
Acid-Base Excess: 7 mmol/L — ABNORMAL HIGH (ref 0.0–2.0)
Bicarbonate: 32.1 mmol/L — ABNORMAL HIGH (ref 20.0–28.0)
Bicarbonate: 32.9 mmol/L — ABNORMAL HIGH (ref 20.0–28.0)
Calcium, Ion: 1.18 mmol/L (ref 1.15–1.40)
Calcium, Ion: 1.21 mmol/L (ref 1.15–1.40)
HCT: 42 % (ref 36.0–46.0)
HCT: 38 % (ref 36.0–46.0)
Hemoglobin: 14.3 g/dL (ref 12.0–15.0)
Hemoglobin: 12.9 g/dL (ref 12.0–15.0)
O2 Saturation: 77 %
O2 Saturation: 100 %
Potassium: 4.6 mmol/L (ref 3.5–5.1)
Potassium: 4.8 mmol/L (ref 3.5–5.1)
Sodium: 140 mmol/L (ref 135–145)
Sodium: 140 mmol/L (ref 135–145)
TCO2: 34 mmol/L — ABNORMAL HIGH (ref 22–32)
TCO2: 34 mmol/L — ABNORMAL HIGH (ref 22–32)
pCO2 arterial: 52.4 mmHg — ABNORMAL HIGH (ref 32.0–48.0)
pCO2 arterial: 53 mmHg — ABNORMAL HIGH (ref 32.0–48.0)
pH, Arterial: 7.395 (ref 7.350–7.450)
pH, Arterial: 7.401 (ref 7.350–7.450)
pO2, Arterial: 43 mmHg — ABNORMAL LOW (ref 83.0–108.0)
pO2, Arterial: 520 mmHg — ABNORMAL HIGH (ref 83.0–108.0)

## 2021-05-13 LAB — COMPREHENSIVE METABOLIC PANEL
ALT: 28 U/L (ref 0–44)
AST: 86 U/L — ABNORMAL HIGH (ref 15–41)
Albumin: 3.5 g/dL (ref 3.5–5.0)
Alkaline Phosphatase: 39 U/L (ref 38–126)
Anion gap: 11 (ref 5–15)
BUN: 40 mg/dL — ABNORMAL HIGH (ref 8–23)
CO2: 27 mmol/L (ref 22–32)
Calcium: 9.4 mg/dL (ref 8.9–10.3)
Chloride: 103 mmol/L (ref 98–111)
Creatinine, Ser: 2.26 mg/dL — ABNORMAL HIGH (ref 0.44–1.00)
GFR, Estimated: 23 mL/min — ABNORMAL LOW (ref >60)
Glucose, Bld: 70 mg/dL (ref 70–99)
Potassium: 4.6 mmol/L (ref 3.5–5.1)
Sodium: 141 mmol/L (ref 135–145)
Total Bilirubin: 0.8 mg/dL (ref 0.3–1.2)
Total Protein: 6.6 g/dL (ref 6.5–8.1)

## 2021-05-13 LAB — CBG MONITORING, ED
Glucose-Capillary: 132 mg/dL — ABNORMAL HIGH (ref 70–99)
Glucose-Capillary: 75 mg/dL (ref 70–99)
Glucose-Capillary: 64 mg/dL — ABNORMAL LOW (ref 70–99)
Glucose-Capillary: 100 mg/dL — ABNORMAL HIGH (ref 70–99)
Glucose-Capillary: 101 mg/dL — ABNORMAL HIGH (ref 70–99)
Glucose-Capillary: 64 mg/dL — ABNORMAL LOW (ref 70–99)

## 2021-05-13 LAB — GLUCOSE, CAPILLARY
Glucose-Capillary: 76 mg/dL (ref 70–99)
Glucose-Capillary: 77 mg/dL (ref 70–99)
Glucose-Capillary: 80 mg/dL (ref 70–99)
Glucose-Capillary: 95 mg/dL (ref 70–99)

## 2021-05-13 LAB — MAGNESIUM: Magnesium: 2.1 mg/dL (ref 1.7–2.4)

## 2021-05-13 LAB — RAPID URINE DRUG SCREEN, HOSP PERFORMED
Amphetamines: NOT DETECTED
Barbiturates: NOT DETECTED
Benzodiazepines: NOT DETECTED
Cocaine: NOT DETECTED
Opiates: NOT DETECTED
Tetrahydrocannabinol: NOT DETECTED

## 2021-05-13 LAB — URINALYSIS, ROUTINE W REFLEX MICROSCOPIC
Bilirubin Urine: NEGATIVE
Glucose, UA: NEGATIVE mg/dL
Hgb urine dipstick: NEGATIVE
Ketones, ur: 5 mg/dL — AB
Leukocytes,Ua: NEGATIVE
Nitrite: NEGATIVE
Protein, ur: NEGATIVE mg/dL
Specific Gravity, Urine: 1.014 (ref 1.005–1.030)
pH: 5 (ref 5.0–8.0)

## 2021-05-13 LAB — I-STAT CHEM 8, ED
BUN: 48 mg/dL — ABNORMAL HIGH (ref 8–23)
Calcium, Ion: 1.18 mmol/L (ref 1.15–1.40)
Chloride: 103 mmol/L (ref 98–111)
Creatinine, Ser: 2.3 mg/dL — ABNORMAL HIGH (ref 0.44–1.00)
Glucose, Bld: 72 mg/dL (ref 70–99)
HCT: 42 % (ref 36.0–46.0)
Hemoglobin: 14.3 g/dL (ref 12.0–15.0)
Potassium: 4.6 mmol/L (ref 3.5–5.1)
Sodium: 140 mmol/L (ref 135–145)
TCO2: 30 mmol/L (ref 22–32)

## 2021-05-13 LAB — LACTIC ACID, PLASMA: Lactic Acid, Venous: 1.1 mmol/L (ref 0.5–1.9)

## 2021-05-13 LAB — PROTIME-INR
INR: 1 (ref 0.8–1.2)
Prothrombin Time: 12.9 seconds (ref 11.4–15.2)

## 2021-05-13 LAB — CK: Total CK: 2377 U/L — ABNORMAL HIGH (ref 38–234)

## 2021-05-13 LAB — VITAMIN B12: Vitamin B-12: 1462 pg/mL — ABNORMAL HIGH (ref 180–914)

## 2021-05-13 LAB — AMMONIA: Ammonia: 35 umol/L (ref 9–35)

## 2021-05-13 LAB — CORTISOL: Cortisol, Plasma: 21.9 ug/dL

## 2021-05-13 LAB — APTT: aPTT: 32 seconds (ref 24–36)

## 2021-05-13 IMAGING — DX DG CHEST 1V PORT
2 series · 2 of 2 positions shown · non-contrast
Comparison: [DATE]

CLINICAL DATA: Evaluate OG tube placement.

EXAM:
PORTABLE CHEST 1 VIEW

[chest ap (1 of 2)]
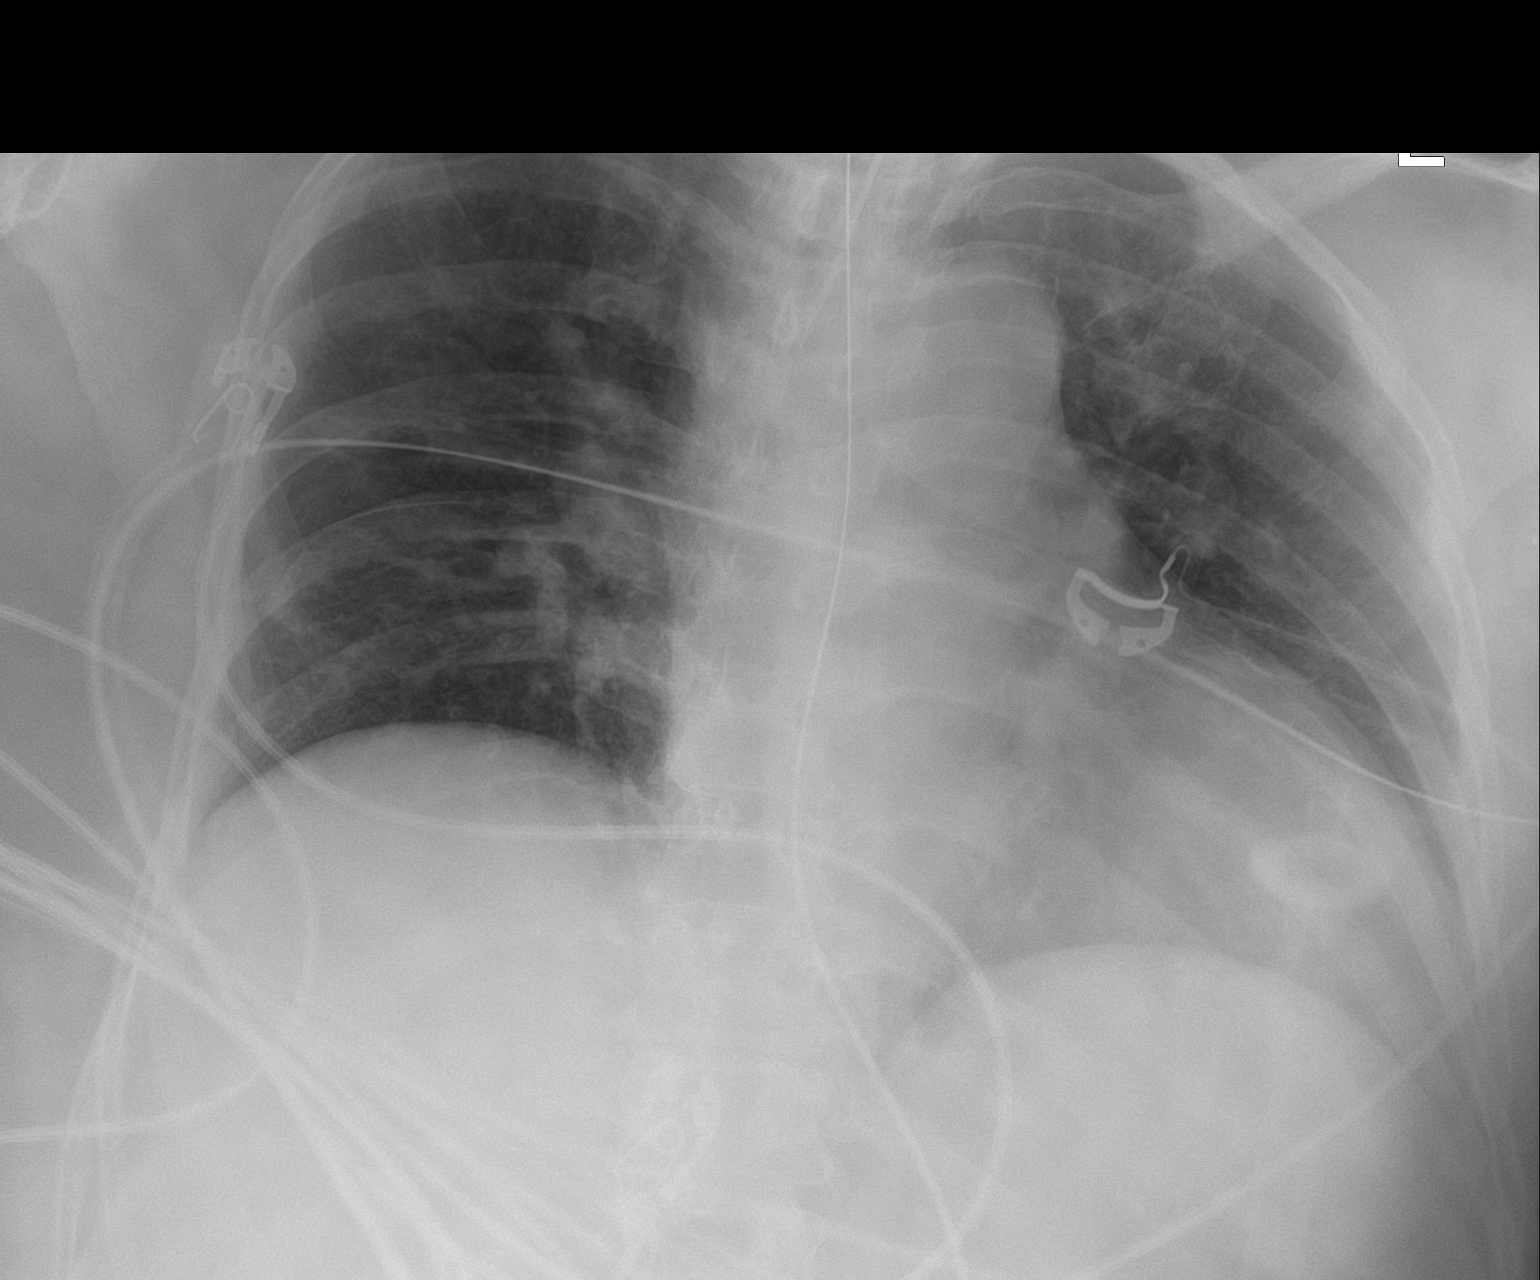

[chest ap (2 of 2)]
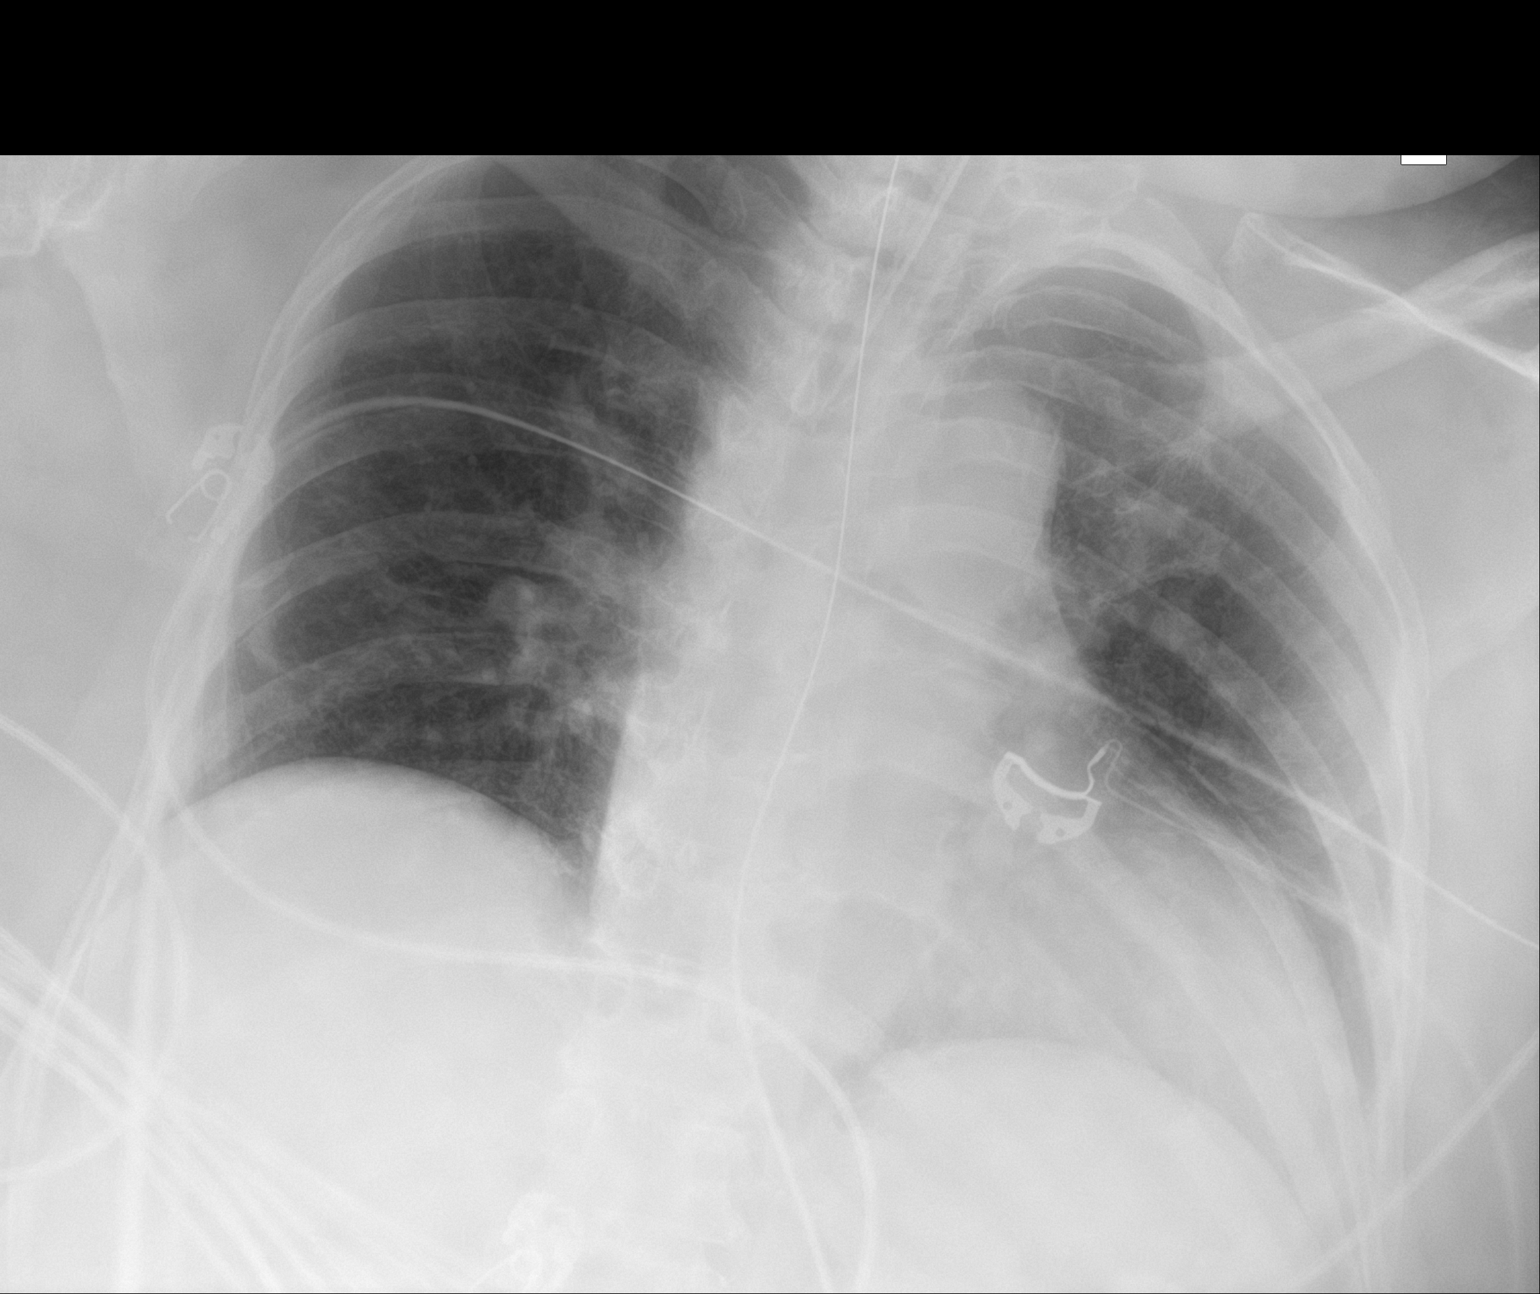

[2 of 2 positions shown; findings below may reference images not displayed]

FINDINGS: ETT tip is above the carina. OG tube tip and side port are below the
GE junction. Mild cardiac enlargement. Lung volumes are low and
there is mild asymmetric elevation of right hemidiaphragm. No
pleural effusion or edema. No airspace densities.
IMPRESSION: OG tube tip is below the GE junction.

## 2021-05-13 IMAGING — CT CT CERVICAL SPINE W/O CM
4 series · 16 of 33 positions shown, 19 images · non-contrast
Comparison: None.

CLINICAL DATA: Poly trauma, critical, head/cervical spine injury
suspected.

EXAM:
CT CERVICAL SPINE WITHOUT CONTRAST
TECHNIQUE: Multidetector CT imaging of the cervical spine was performed without
intravenous contrast. Multiplanar CT image reconstructions were also
generated.

[Series 4: c_spine 2.0 orthogonals · axial · 0.29mm/px · z∈[-265,-161]mm · 5 of 79 slices shown, 7 images]
[im 14/79  soft-tissue]
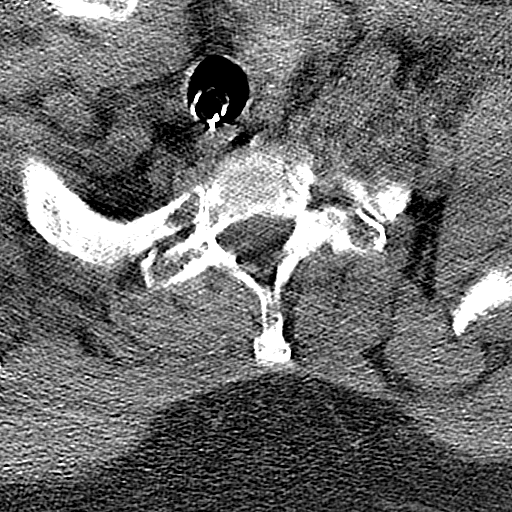
[im 14/79  bone]
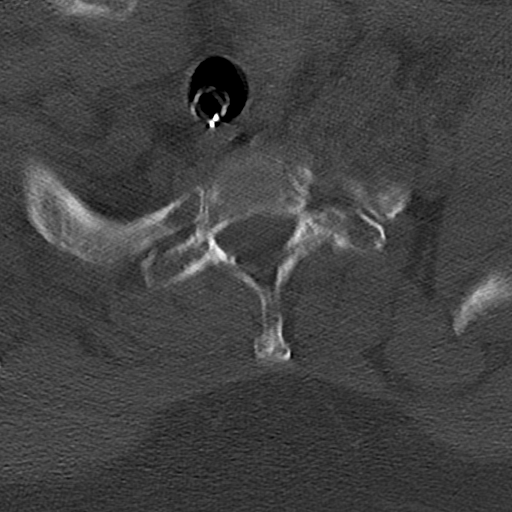
[im 27/79  bone]
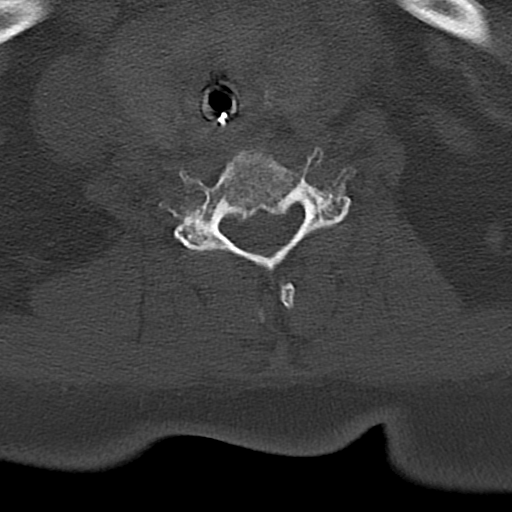
[im 40/79  bone]
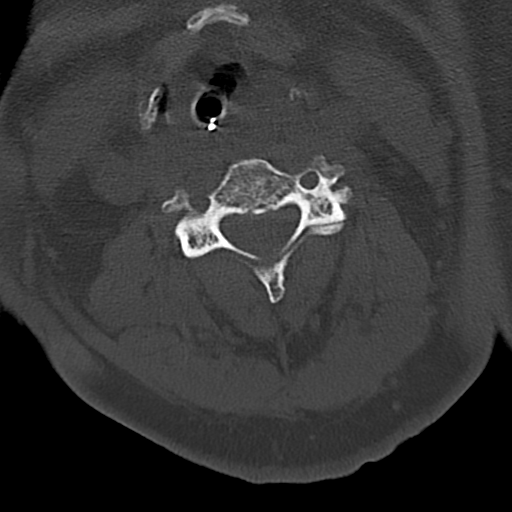
[im 53/79  bone]
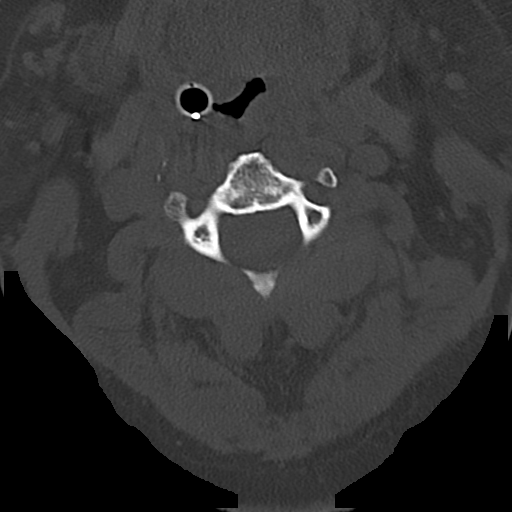
[im 66/79  soft-tissue]
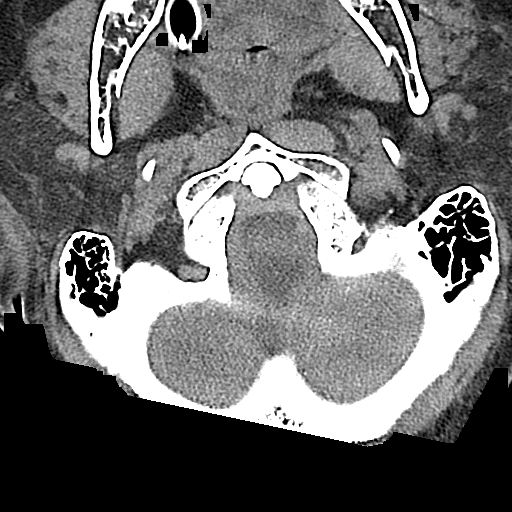
[im 66/79  bone]
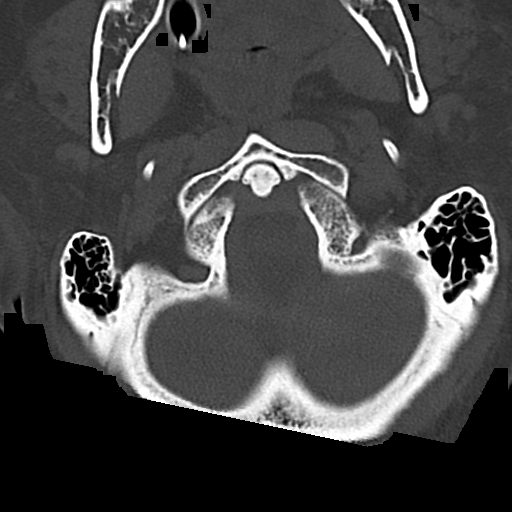

[Series 5: c_spine 2.0 st · axial · 0.30mm/px · z∈[-237,-189]mm · 3 of 73 slices shown]
[im 13/73  bone]
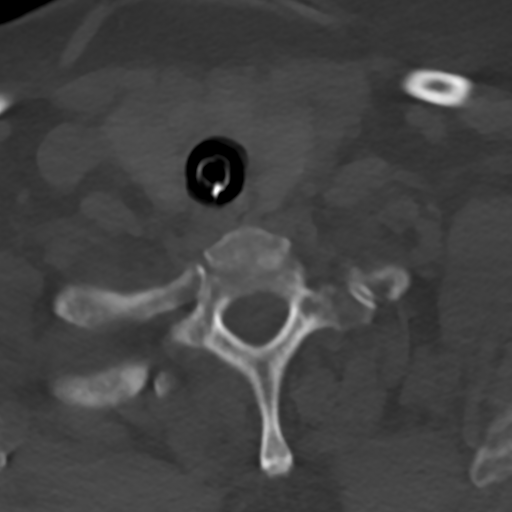
[im 25/73  bone]
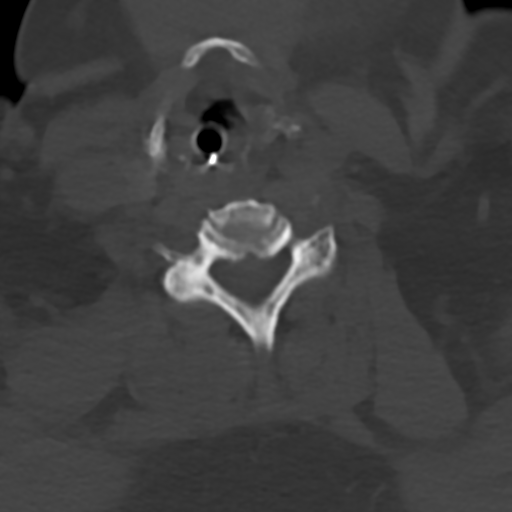
[im 37/73  bone]
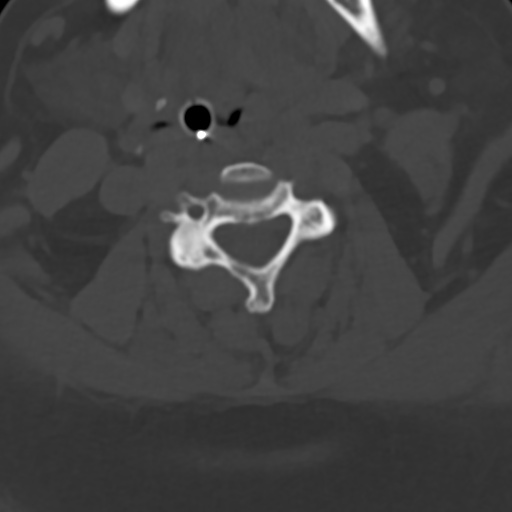

[Series 7: c_spine 2.0 sag bone · sagittal · 0.28mm/px · 5 of 42 slices shown, 6 images]
[im 14/42  bone]
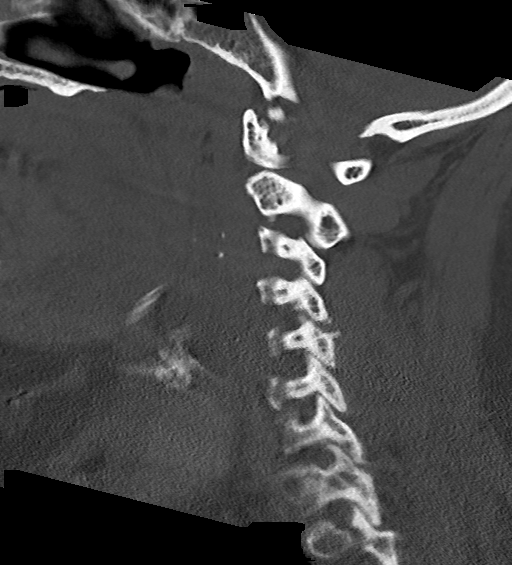
[im 18/42  bone]
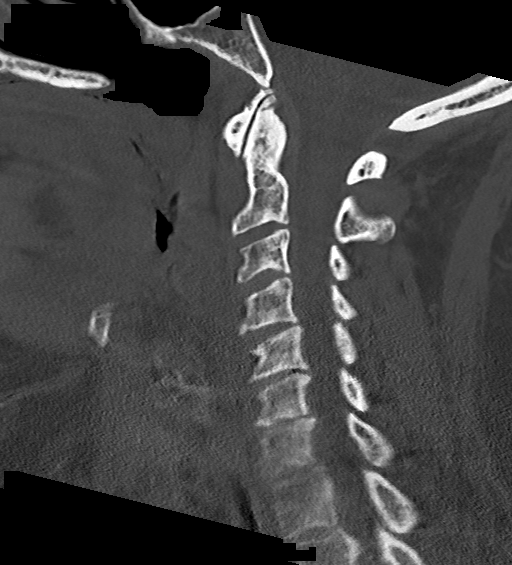
[im 21/42  soft-tissue]
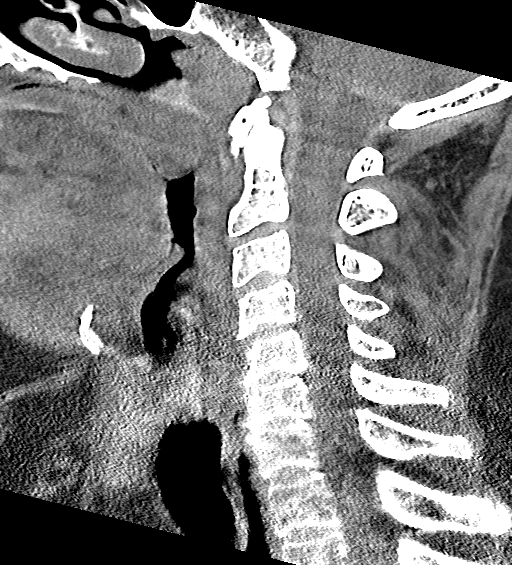
[im 21/42  bone]
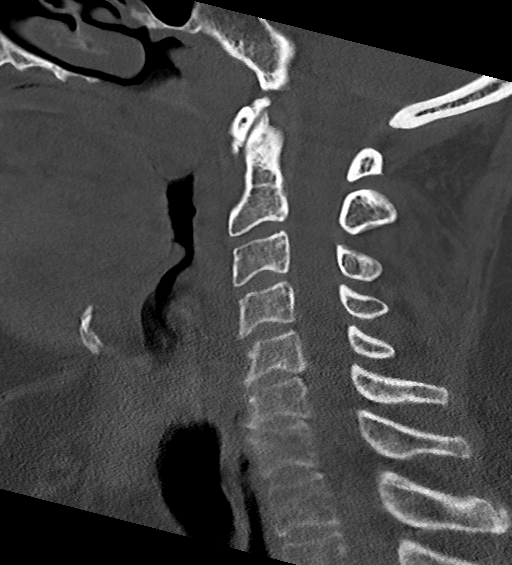
[im 24/42  bone]
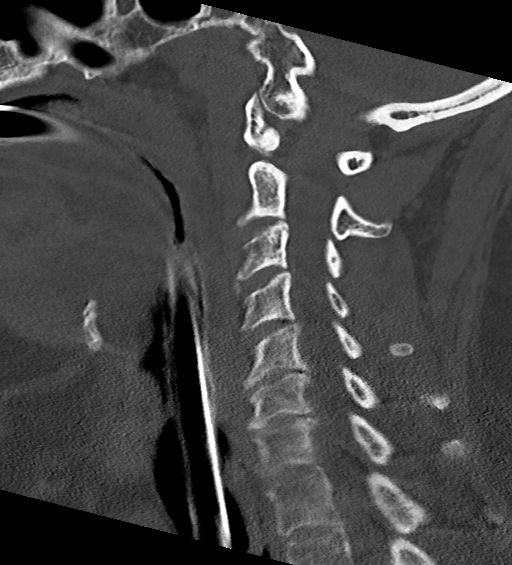
[im 28/42  bone]
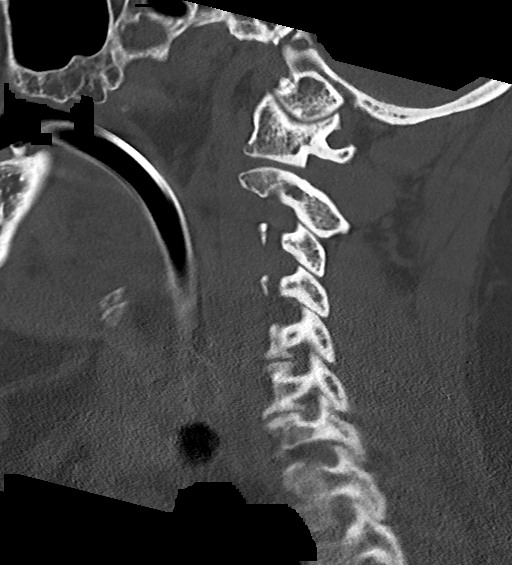

[Series 8: c_spine 2.0 cor bone · coronal · 0.29mm/px · 3 of 73 slices shown]
[im 15/73  bone]
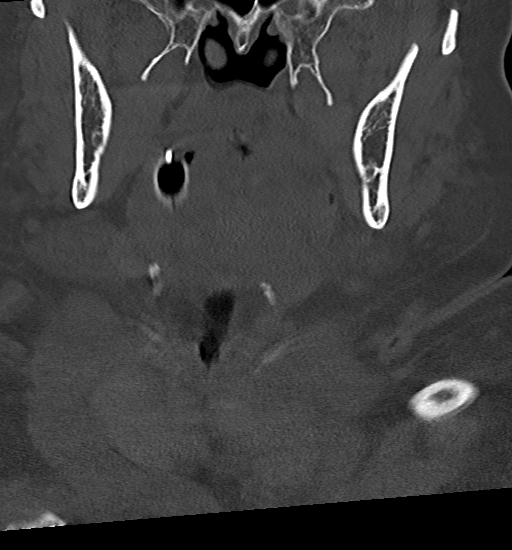
[im 29/73  bone]
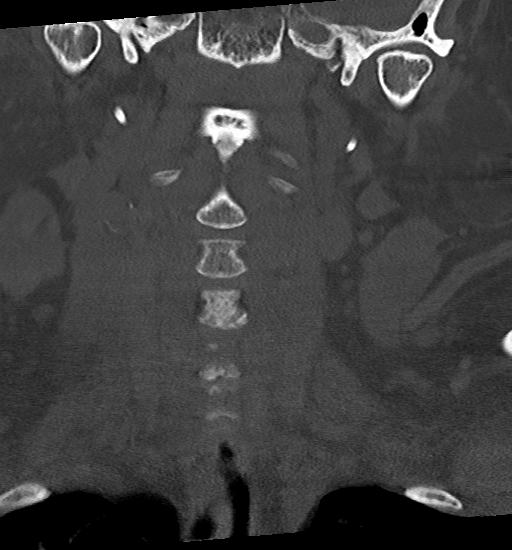
[im 44/73  bone]
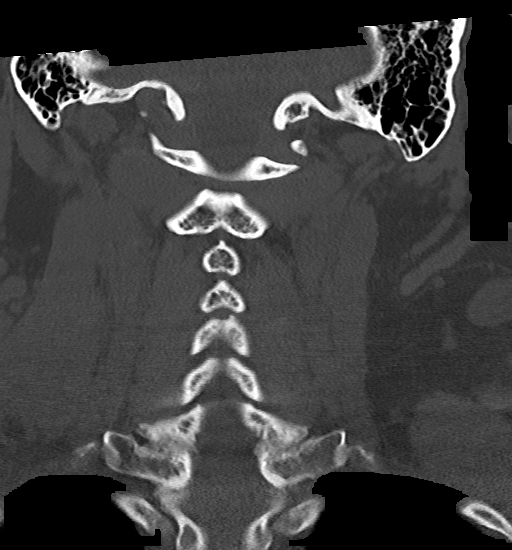

[16 of 33 positions shown; findings below may reference images not displayed]

FINDINGS: Alignment: No malalignment.

Skull base and vertebrae: Limited detail because of shoulder
density. No evidence of regional fracture or focal lesion.

Soft tissues and spinal canal: Endotracheal intubation.

Disc levels: Chronic facet arthropathy on the right at C6-7 and
C7-T1 and on the left at C4-5. Mild spondylosis C5-6 and C6-7. Mild
bilateral bony foraminal stenosis C4-5, C5-6 and C6-7.

Upper chest: Negative

Other: None
IMPRESSION: No acute traumatic finding. Ordinary cervical spondylosis and facet
arthritis as described above.

## 2021-05-13 IMAGING — DX DG CHEST 1V PORT
1 series · 1 of 1 positions shown · non-contrast
Comparison: None.

CLINICAL DATA: Questionable sepsis - evaluate for abnormality

EXAM:
PORTABLE CHEST 1 VIEW

[chest]
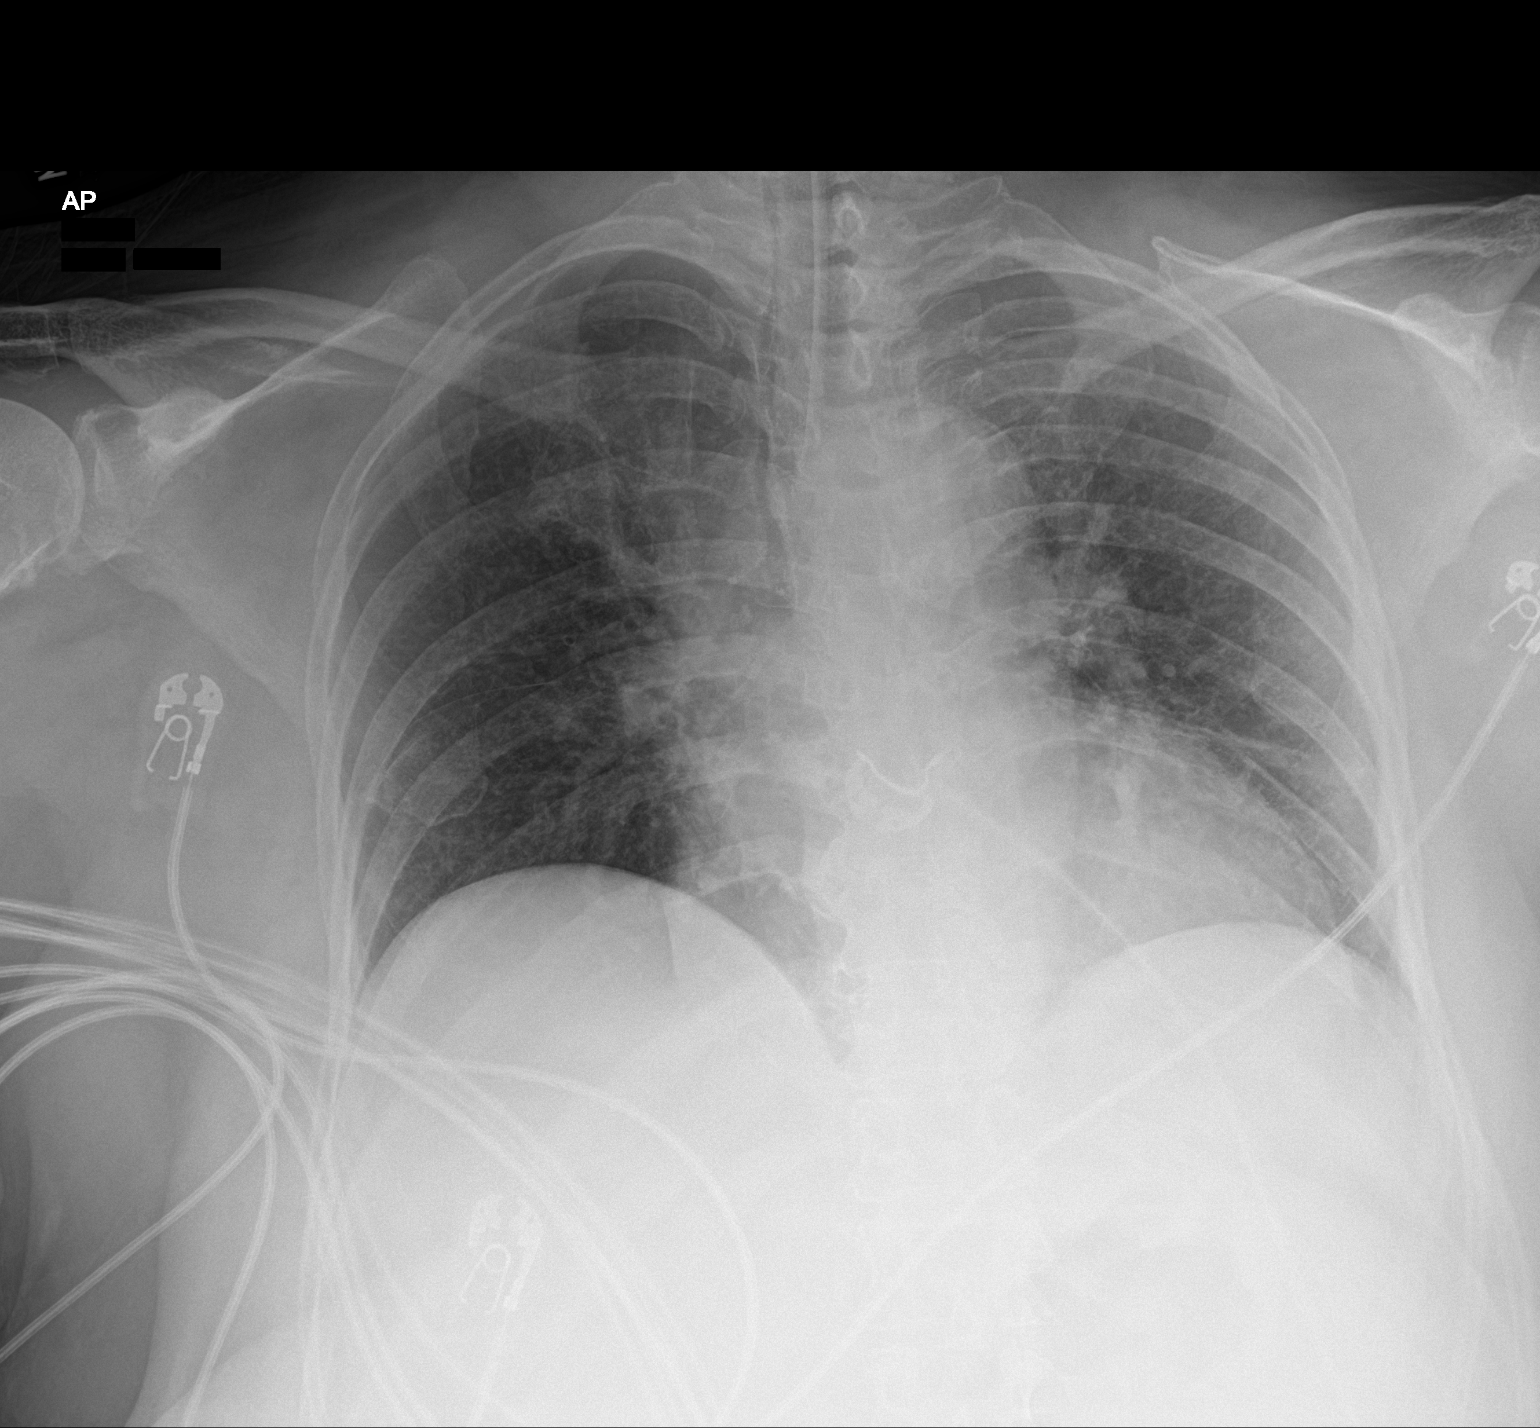

[1 of 1 positions shown; findings below may reference images not displayed]

FINDINGS: There is an endotracheal tube overlying the midthoracic trachea,
cm above the carina. Cardiomediastinal silhouette is within normal
limits. There is no focal airspace consolidation. There is no large
pleural effusion or visible pneumothorax. There is no acute osseous
abnormality.
IMPRESSION: Endotracheal tube tip overlies the midthoracic trachea.

No evidence of acute cardiopulmonary disease.

## 2021-05-13 IMAGING — CT CT HEAD W/O CM
4 of 5 series · 15 of 47 positions shown, 17 images · non-contrast
Comparison: [DATE].

CLINICAL DATA: Altered mental status.

EXAM:
CT HEAD WITHOUT CONTRAST
TECHNIQUE: Contiguous axial images were obtained from the base of the skull
through the vertex without intravenous contrast.

[Series 3: head without · axial · non-contrast · 0.48mm/px · z∈[-83,+27]mm · 5 of 34 slices shown, 7 images]
[im 6/34  brain]
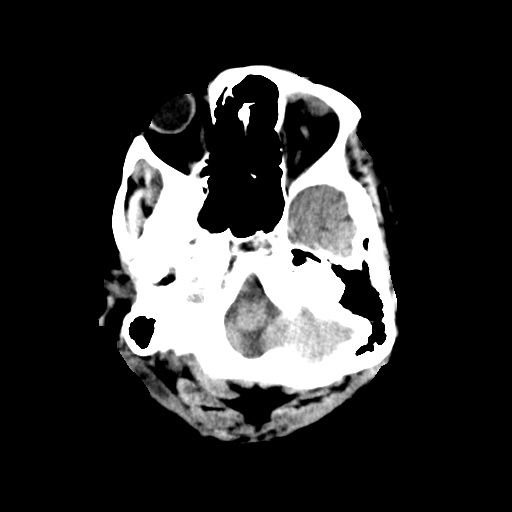
[im 6/34  bone]
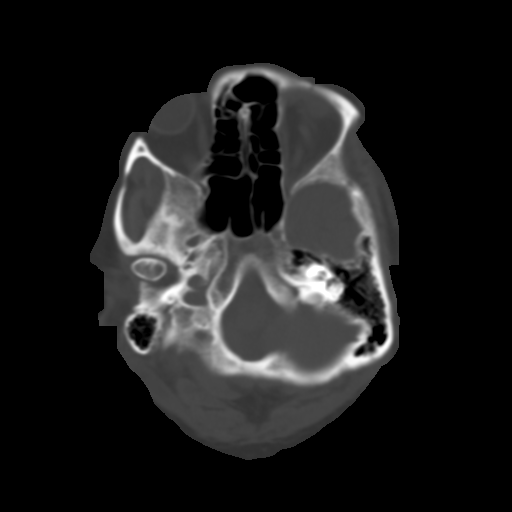
[im 12/34  brain]
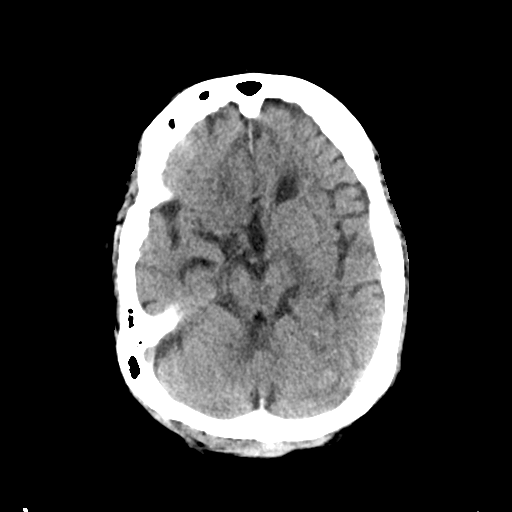
[im 17/34  brain]
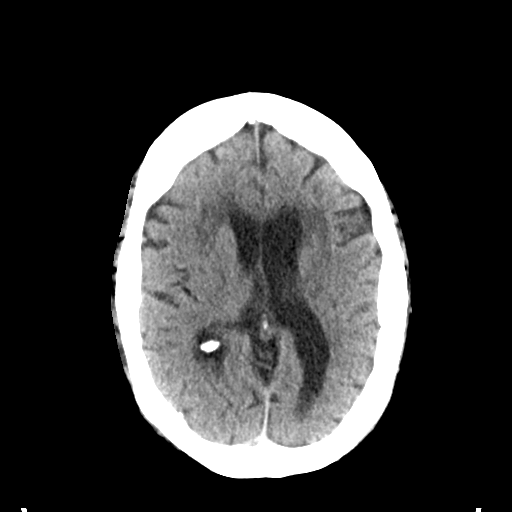
[im 23/34  brain]
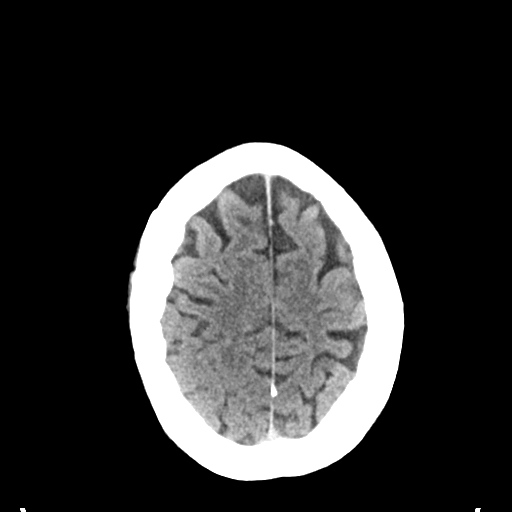
[im 28/34  brain]
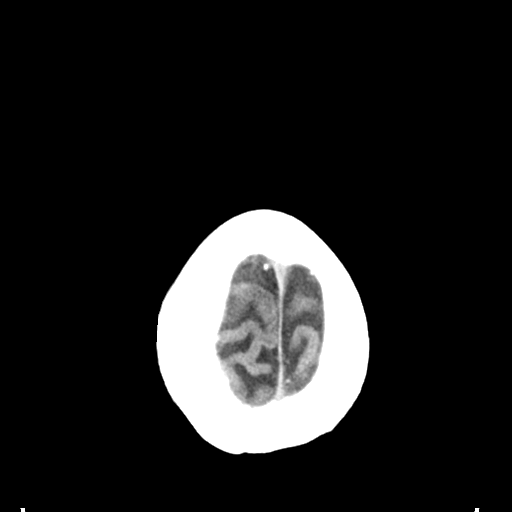
[im 28/34  bone]
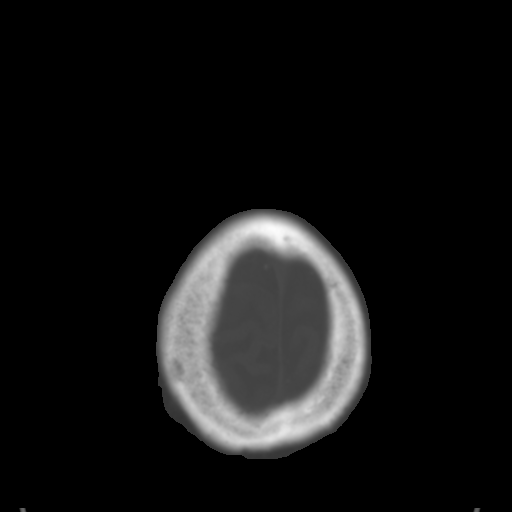

[Series 4: head without ax · axial · non-contrast · 0.37mm/px · z∈[-68,+11]mm · 4 of 34 slices shown]
[im 6/34  brain]
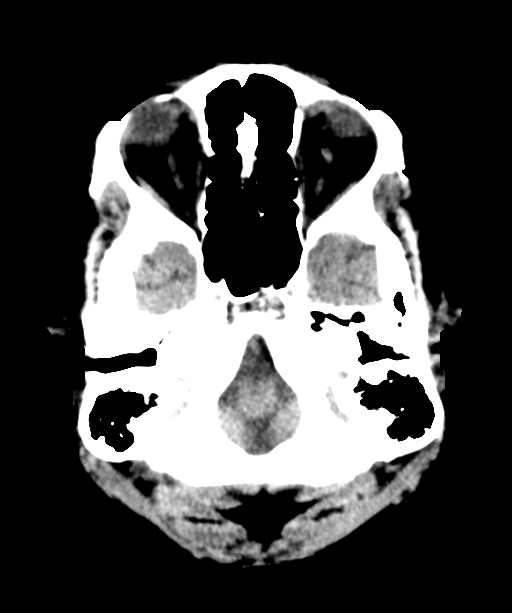
[im 12/34  brain]
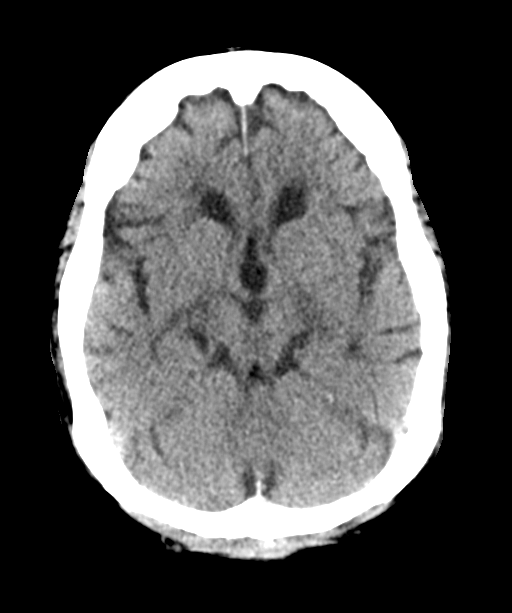
[im 17/34  brain]
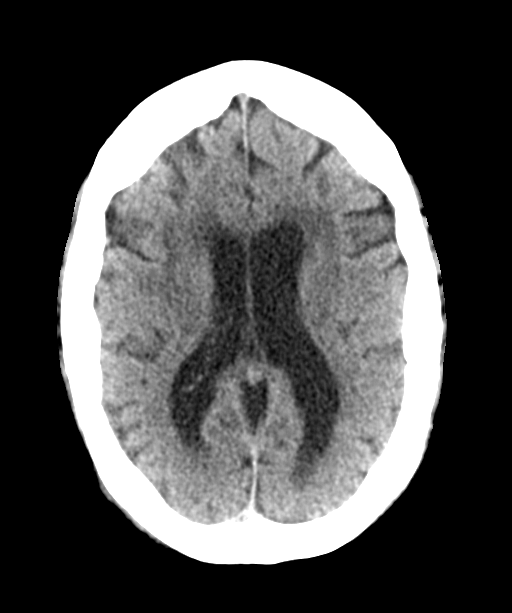
[im 23/34  brain]
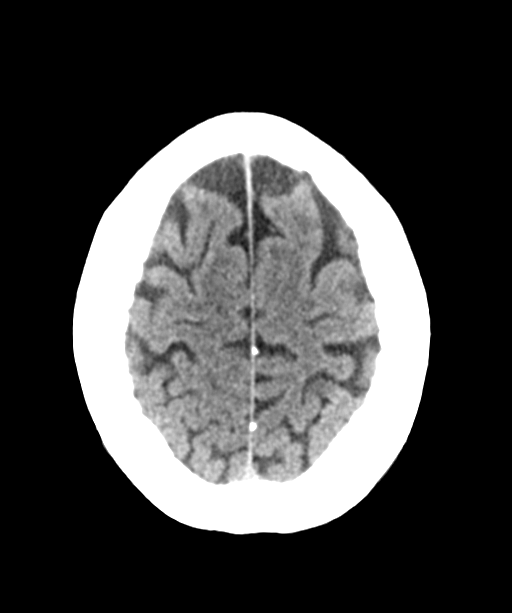

[Series 6: head without cor · coronal · non-contrast · 0.32mm/px · 3 of 64 slices shown]
[im 22/64  brain]
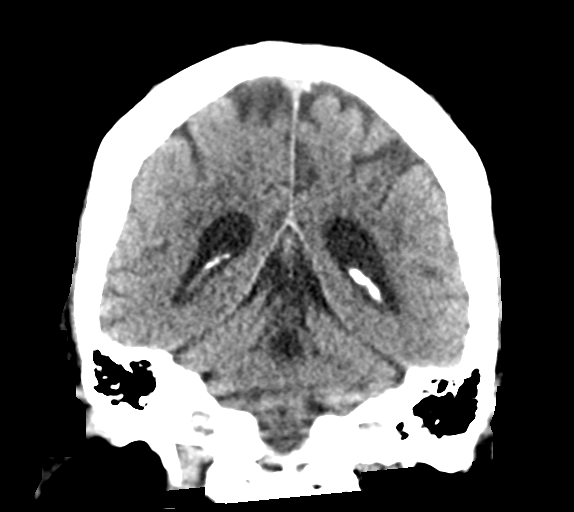
[im 29/64  brain]
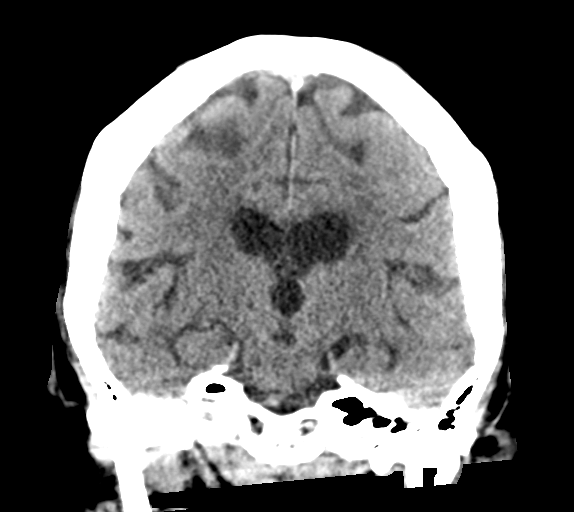
[im 36/64  brain]
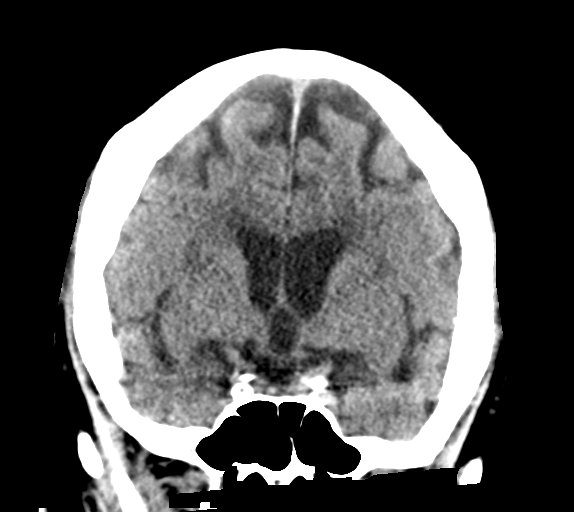

[Series 7: head without sag · sagittal · non-contrast · 0.32mm/px · 3 of 49 slices shown]
[im 17/49  brain]
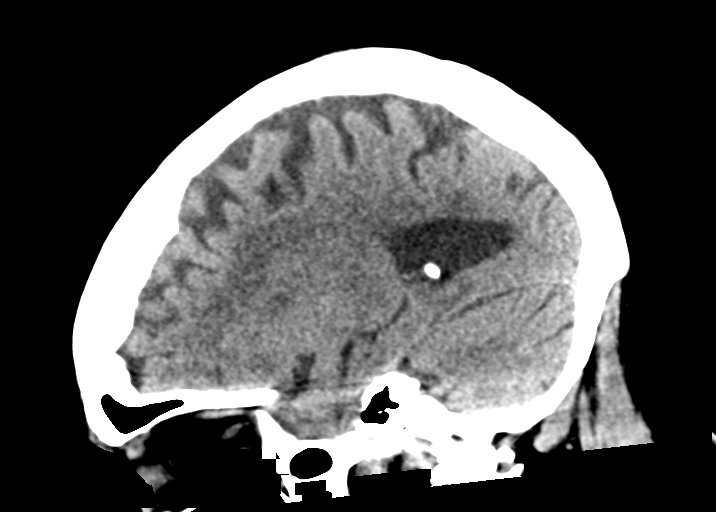
[im 25/49  brain]
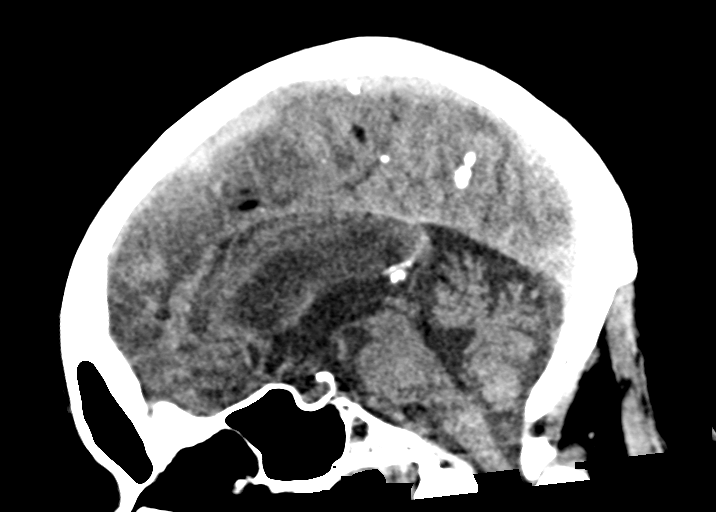
[im 33/49  brain]
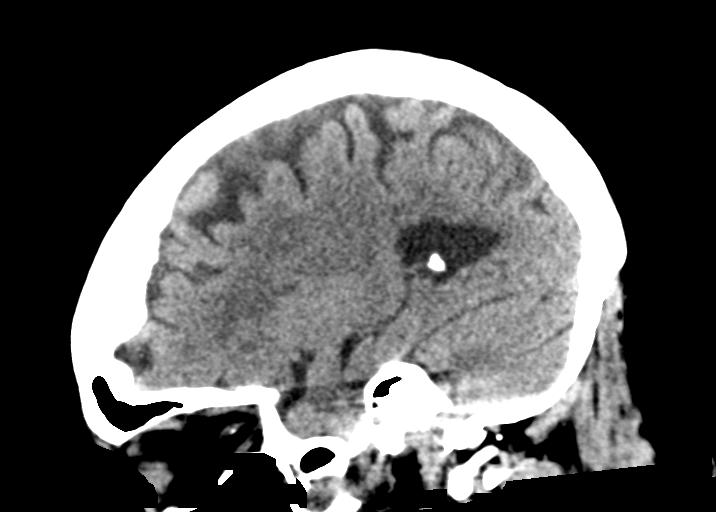

[15 of 47 positions shown; findings below may reference images not displayed]

FINDINGS: Brain: No evidence of acute infarction, hemorrhage, hydrocephalus,
extra-axial collection or mass lesion/mass effect.

Vascular: No hyperdense vessel or unexpected calcification.

Skull: Normal. Negative for fracture or focal lesion.

Sinuses/Orbits: No acute finding.

Other: None.
IMPRESSION: No acute intracranial abnormality seen.

## 2021-05-13 MED ORDER — DEXTROSE 50 % IV SOLN
INTRAVENOUS | Status: AC
  Filled 2021-05-13: qty 50

## 2021-05-13 MED ORDER — OCTREOTIDE ACETATE 50 MCG/ML IJ SOLN
50.0000 ug | Freq: Once | INTRAMUSCULAR | Status: AC
  Administered 2021-05-13: 50 ug via SUBCUTANEOUS
  Filled 2021-05-13 (×2): qty 1

## 2021-05-13 MED ORDER — PANTOPRAZOLE SODIUM 40 MG IV SOLR
40.0000 mg | Freq: Every day | INTRAVENOUS | Status: DC
  Administered 2021-05-13 – 2021-05-15 (×3): 40 mg via INTRAVENOUS
  Filled 2021-05-13 (×3): qty 40

## 2021-05-13 MED ORDER — FENTANYL CITRATE PF 50 MCG/ML IJ SOSY: 25.0000 ug | PREFILLED_SYRINGE | INTRAMUSCULAR | Status: DC | PRN

## 2021-05-13 MED ORDER — SUCCINYLCHOLINE CHLORIDE 20 MG/ML IJ SOLN
INTRAMUSCULAR | Status: AC | PRN
Start: 2021-05-13 — End: 2021-05-13
  Administered 2021-05-13: 100 mg via INTRAVENOUS

## 2021-05-13 MED ORDER — ETOMIDATE 2 MG/ML IV SOLN
INTRAVENOUS | Status: AC | PRN
  Administered 2021-05-13: 30 mg via INTRAVENOUS

## 2021-05-13 MED ORDER — PROPOFOL 1000 MG/100ML IV EMUL
INTRAVENOUS | Status: AC
  Administered 2021-05-13: 5 ug/kg/min via INTRAVENOUS
  Filled 2021-05-13: qty 100

## 2021-05-13 MED ORDER — POLYETHYLENE GLYCOL 3350 17 G PO PACK: 17.0000 g | PACK | Freq: Every day | ORAL | Status: DC

## 2021-05-13 MED ORDER — HEPARIN SODIUM (PORCINE) 5000 UNIT/ML IJ SOLN
5000.0000 [IU] | Freq: Three times a day (TID) | INTRAMUSCULAR | Status: DC
Start: 2021-05-13 — End: 2021-05-29
  Administered 2021-05-13 – 2021-05-29 (×44): 5000 [IU] via SUBCUTANEOUS
  Filled 2021-05-13 (×45): qty 1

## 2021-05-13 MED ORDER — VANCOMYCIN HCL 1500 MG/300ML IV SOLN: 1500.0000 mg | INTRAVENOUS | Status: DC

## 2021-05-13 MED ORDER — NALOXONE HCL 2 MG/2ML IJ SOSY: 2.0000 mg | PREFILLED_SYRINGE | Freq: Once | INTRAMUSCULAR | Status: AC

## 2021-05-13 MED ORDER — ACETAMINOPHEN 325 MG PO TABS
650.0000 mg | ORAL_TABLET | ORAL | Status: DC | PRN
  Administered 2021-05-14 – 2021-06-08 (×6): 650 mg
  Filled 2021-05-13 (×6): qty 2

## 2021-05-13 MED ORDER — DEXTROSE 10 % IV SOLN: INTRAVENOUS | Status: DC

## 2021-05-13 MED ORDER — ONDANSETRON HCL 4 MG/2ML IJ SOLN: 4.0000 mg | Freq: Four times a day (QID) | INTRAMUSCULAR | Status: DC | PRN

## 2021-05-13 MED ORDER — DOCUSATE SODIUM 100 MG PO CAPS: 100.0000 mg | ORAL_CAPSULE | Freq: Two times a day (BID) | ORAL | Status: DC | PRN

## 2021-05-13 MED ORDER — SODIUM CHLORIDE 0.9 % IV SOLN
2.0000 g | Freq: Three times a day (TID) | INTRAVENOUS | Status: DC
  Administered 2021-05-13 – 2021-05-15 (×5): 2 g via INTRAVENOUS
  Filled 2021-05-13 (×6): qty 2000

## 2021-05-13 MED ORDER — LACTATED RINGERS IV SOLN: INTRAVENOUS | Status: DC

## 2021-05-13 MED ORDER — CHLORHEXIDINE GLUCONATE 0.12% ORAL RINSE (MEDLINE KIT)
15.0000 mL | Freq: Two times a day (BID) | OROMUCOSAL | Status: DC
  Administered 2021-05-13 – 2021-06-15 (×64): 15 mL via OROMUCOSAL

## 2021-05-13 MED ORDER — THIAMINE HCL 100 MG/ML IJ SOLN
100.0000 mg | Freq: Every day | INTRAMUSCULAR | Status: AC
  Administered 2021-05-13 – 2021-05-15 (×3): 100 mg via INTRAVENOUS
  Filled 2021-05-13 (×3): qty 2

## 2021-05-13 MED ORDER — POLYETHYLENE GLYCOL 3350 17 G PO PACK: 17.0000 g | PACK | Freq: Every day | ORAL | Status: DC | PRN

## 2021-05-13 MED ORDER — NALOXONE HCL 2 MG/2ML IJ SOSY
PREFILLED_SYRINGE | INTRAMUSCULAR | Status: AC
  Administered 2021-05-13: 2 mg via INTRAVENOUS
  Filled 2021-05-13: qty 2

## 2021-05-13 MED ORDER — DEXTROSE 5 % IV SOLN
10.0000 mg/kg | INTRAVENOUS | Status: DC
  Administered 2021-05-13 – 2021-05-14 (×2): 810 mg via INTRAVENOUS
  Filled 2021-05-13 (×3): qty 16.2

## 2021-05-13 MED ORDER — FENTANYL CITRATE (PF) 100 MCG/2ML IJ SOLN: 25.0000 ug | INTRAMUSCULAR | Status: DC | PRN

## 2021-05-13 MED ORDER — DOCUSATE SODIUM 50 MG/5ML PO LIQD
100.0000 mg | Freq: Two times a day (BID) | ORAL | Status: DC | PRN
  Filled 2021-05-13: qty 10

## 2021-05-13 MED ORDER — PROPOFOL 1000 MG/100ML IV EMUL: 0.0000 ug/kg/min | INTRAVENOUS | Status: DC

## 2021-05-13 MED ORDER — ORAL CARE MOUTH RINSE
15.0000 mL | OROMUCOSAL | Status: DC
  Administered 2021-05-13 – 2021-06-15 (×311): 15 mL via OROMUCOSAL

## 2021-05-13 MED ORDER — DOCUSATE SODIUM 50 MG/5ML PO LIQD: 100.0000 mg | Freq: Two times a day (BID) | ORAL | Status: DC

## 2021-05-13 MED ORDER — PROPOFOL 1000 MG/100ML IV EMUL
0.0000 ug/kg/min | INTRAVENOUS | Status: DC
  Administered 2021-05-13: 35 ug/kg/min via INTRAVENOUS

## 2021-05-13 MED ORDER — SODIUM CHLORIDE 0.9 % IV BOLUS
1000.0000 mL | Freq: Once | INTRAVENOUS | Status: AC
  Administered 2021-05-13: 1000 mL via INTRAVENOUS

## 2021-05-13 MED ORDER — VANCOMYCIN HCL 10 G IV SOLR
2500.0000 mg | Freq: Once | INTRAVENOUS | Status: AC
  Administered 2021-05-13: 2500 mg via INTRAVENOUS
  Filled 2021-05-13: qty 2500

## 2021-05-13 MED ORDER — SODIUM CHLORIDE 0.9 % IV SOLN
2.0000 g | Freq: Two times a day (BID) | INTRAVENOUS | Status: DC
  Administered 2021-05-13 – 2021-05-14 (×3): 2 g via INTRAVENOUS
  Filled 2021-05-13 (×4): qty 20

## 2021-05-13 NOTE — Progress Notes (Addendum)
[facility] Progress Note Patient Name: Kimberly Gross DOB: 28-Jan-1951 MRN: [id number]   Date of Service  05/13/2021  HPI/Events of Note  Patient arrived to ICU. Does not have sedatives infusing. Wakes up but not following commands. Is on the vent and has HR in 80s. SBP in 150s.   eICU Interventions  Has propofol drip ordered and PRN fentanyl as well. Per CCM note trying to avoid sedation  D/w RN and will use the PRN fentanyl instead and avoid continuous sedation   If the patient needs sedation after fentanyl does not work, we will use precedex instead   Rest per CCM orders Call us if needed      Intervention Category Major Interventions: Sepsis - evaluation and management;Respiratory failure - evaluation and management Evaluation Type: New Patient Evaluation  Margaretmary Lombard 05/13/2021, 8:05 PM  Addendum 1:55 am- Order requested for Foley cath that was placed in ER. Order placed

## 2021-05-13 NOTE — ED Notes (Signed)
Unable to obtain bld culture RN aware.

## 2021-05-13 NOTE — ED Notes (Signed)
CCM provider at bedside

## 2021-05-13 NOTE — Progress Notes (Addendum)
Pharmacy Antibiotic Note  Kimberly Gross is a 70 y.o. female admitted on 05/13/2021 found unresponsive and hypoglycemic now starting dextrose infusion.  Pharmacy has been consulted for vancomycin and acyclovir dosing for meningitis. Scr 2.3 - appears at baseline. Started on ceftriaxone and ampicillin per critical care.   Plan: Vancomycin 2500mg  loading dose followed by vancomycin 1500mg  q48h based on nomogram for TBW 100-120kg and CrCl <30 Acyclovir 10mg /kg q24h based on ABW for TBW >40% and CrCl 10-30 ml/min LR MIVF at 50 ml/hr Continue ceftriaxone and ampicillin per MD - ampicillin reduced to 2g IV q8h based on renal function per pharmacy protocol Monitor renal function, cultures, and clinical progression  Height: 5\' 4"  (162.6 cm) Weight: 120.2 kg (265 lb) IBW/kg (Calculated) : 54.7  Temp (24hrs), Avg:100 F (37.8 C), Min:98 F (36.7 C), Max:101.2 F (38.4 C)  Recent Labs  Lab 05/13/21 1340 05/13/21 1342 05/13/21 1345  WBC 6.1  --   --   CREATININE 2.26* 2.30*  --   LATICACIDVEN  --   --  1.1    Estimated Creatinine Clearance: 29.1 mL/min (A) (by C-G formula based on SCr of 2.3 mg/dL (H)).    Allergies  Allergen Reactions   Chlorpromazine Rash    Antimicrobials this admission: Vancomycin 10/31  Ceftriaxone 10/31 >>  Ampicillin 10/31 >>  Acyclovir 10/31 >>  Dose adjustments this admission:   Microbiology results: 10/31 ucx: 10/31 bcx:  Thank you for allowing pharmacy to be a part of this patient's care.  Cristela Felt, PharmD, BCPS Clinical Pharmacist 05/13/2021 5:08 PM

## 2021-05-13 NOTE — ED Provider Notes (Signed)
[facility] EMERGENCY DEPARTMENT Provider Note   CSN: [id number] Arrival date & time: 05/13/21  1329     History Chief Complaint  Patient presents with   Altered Mental Status    Kimberly Gross is a 70 y.o. female.  Level 5 caveat due to altered mental status.  Family and EMS able to provide history.  Patient was last seen yesterday morning.  History of CKD, diabetes, bipolar.  Was at her normal state of health when family member last saw her.  She was slow to wake up this morning and eventually family member found her unresponsive in bed.  She had a blood sugar in the 30s with EMS.  They were able to give her glucagon and blood sugar came up to 75.  She has been nonresponsive.  Put on a nonrebreather.  However had normal vitals per EMS.  The history is provided by the EMS personnel and a relative.  Altered Mental Status Presenting symptoms: unresponsiveness   Severity:  Mild Timing:  Constant Progression:  Worsening     Past Medical History:  Diagnosis Date   Anemia    Anxiety    Bipolar 1 disorder ([place])    CKD (chronic kidney disease), stage IV (HCC)    Depression    Diabetes mellitus without complication ([place])    Hyperlipemia    Hypertension    Occasional tremors     Patient Active Problem List   Diagnosis Date Noted   CKD (chronic kidney disease) stage 4, GFR 15-29 ml/min ([place]) 09/12/2020   Cough 09/10/2018   Type 2 diabetes mellitus without complication, without long-term current use of insulin ([place]) 05/14/2018   Essential hypertension 05/14/2018   Upper respiratory infection, viral 05/14/2018   Hyperlipidemia 05/05/2018   Morbid (severe) obesity due to excess calories ([place]) 01/22/2017   OSA (obstructive sleep apnea) 01/22/2017   Tremor 05/08/2015   Anxiety state 05/08/2015   Medication-induced postural tremor 05/08/2015   Bipolar 1 disorder, mixed, moderate ([place]) 05/08/2015    Past Surgical History:  Procedure Laterality Date    CATARACT EXTRACTION, BILATERAL  2021   VAGINAL HYSTERECTOMY       OB History   No obstetric history on file.     Family History  Problem Relation Age of Onset   Hypertension Mother    Heart attack Father    Breast cancer Neg Hx     Social History   Tobacco Use   Smoking status: Never   Smokeless tobacco: Never  Vaping Use   Vaping Use: Never used  Substance Use Topics   Alcohol use: No   Drug use: No    Home Medications Prior to Admission medications   Medication Sig Start Date End Date Taking? Authorizing Provider  ACCU-CHEK AVIVA PLUS test strip TEST BLOOD SUGAR TWICE DAILY 10/13/19   [name], FNP  Accu-Chek Softclix Lancets lancets USE AS DIRECTED  TO CHECK BLOOD GLUCOSE 11/22/18   [name], FNP  acetaminophen (TYLENOL) 500 MG tablet Take 1,000 mg by mouth 2 (two) times a day.    [provider]  Alcohol Swabs (B-D SINGLE USE SWABS REGULAR) PADS Use as directed to check blood sugars twice daily 08/01/19   [name], FNP  amLODipine (NORVASC) 10 MG tablet TAKE 1 TABLET BY MOUTH EVERY DAY 01/15/21   [name], FNP  aspirin 81 MG tablet Take 81 mg by mouth daily.  05/01/15   [provider]  atorvastatin (LIPITOR) 40 MG tablet TAKE 1  TABLET BY MOUTH EVERYDAY AT BEDTIME 10/11/20   [name], FNP  BIOTIN PO Take 1 tablet by mouth daily at 12 noon.    [provider]  Blood Glucose Monitoring Suppl (ACCU-CHEK AVIVA PLUS) w/Device KIT Check blood sugar twice a day 09/23/18   [name], FNP  carvedilol (COREG) 25 MG tablet Take 1 tablet (25 mg total) by mouth 2 (two) times daily. 04/10/20 12/26/21  Elouise Munroe, MD  cholecalciferol (VITAMIN D3) 25 MCG (1000 UNIT) tablet Take 1,000 Units by mouth daily.    [provider]  clonazePAM (KLONOPIN) 0.5 MG tablet Take 1 tablet (0.5 mg total) by mouth 2 (two) times daily. 02/27/20   [name], FNP  divalproex (DEPAKOTE) 500 MG DR tablet 500 mg. 2 pills 2 times per day  04/09/15   [provider]  DM-APAP-CPM (CORICIDIN HBP PO) Take by mouth as needed.    [provider]  fenofibrate 160 MG tablet TAKE 1 TABLET BY MOUTH EVERY DAY 12/26/20   [name], FNP  ferrous sulfate 325 (65 FE) MG tablet Take 325 mg by mouth daily with breakfast.    [provider]  furosemide (LASIX) 20 MG tablet Take 1 tablet (20 mg total) by mouth daily. Take one tablet daily 03/29/21   Elouise Munroe, MD  hydrALAZINE (APRESOLINE) 100 MG tablet Take 1 tablet (100 mg total) by mouth 3 (three) times daily. 10/24/20   Elouise Munroe, MD  Insulin Pen Needle (DROPLET PEN NEEDLES) 32G X 4 MM MISC USE WITH XULTOPHY ONE TIME DAILY. 07/26/19   Glendale Chard, MD  Multiple Vitamins-Minerals (MULTIVITAMIN ADULT PO) Use as directed 1 tablet in the mouth or throat daily. 04/27/15   [provider]  traZODone (DESYREL) 50 MG tablet  10/26/19   [provider]  vitamin B-12 (CYANOCOBALAMIN) 250 MCG tablet Take 250 mcg by mouth daily.    [provider]  XULTOPHY 100-3.6 UNIT-MG/ML SOPN INJECT 30 UNITS INTO THE SKIN DAILY. 10/25/20   [name], FNP    Allergies    Chlorpromazine  Review of Systems   Review of Systems  Unable to perform ROS: Acuity of condition   Physical Exam Updated Vital Signs BP (!) 161/93 (BP Location: Left Arm)   Pulse 76   Resp 15   Ht 5' 4" (1.626 m)   Wt 120.2 kg   SpO2 100%   BMI 45.49 kg/m   Physical Exam Constitutional:      General: She is in acute distress.     Appearance: She is ill-appearing.  HENT:     Head: Normocephalic.     Nose: Nose normal.  Eyes:     General:        Right eye: No discharge.        Left eye: No discharge.     Conjunctiva/sclera: Conjunctivae normal.     Comments: Pupils are 2 mm and minimally responsive  Cardiovascular:     Pulses: Normal pulses.     Heart sounds: Normal heart sounds.  Pulmonary:     Breath sounds: Rhonchi present.     Comments: Diminished  breath sounds throughout Abdominal:     General: There is no distension.  Musculoskeletal:        General: No deformity.  Skin:    General: Skin is warm.  Neurological:     GCS: GCS eye subscore is 1. GCS verbal subscore is 1. GCS motor subscore is 1.    ED Results /  Procedures / Treatments   Labs (all labs ordered are listed, but only abnormal results are displayed) Labs Reviewed  I-STAT CHEM 8, ED - Abnormal; Notable for the following components:      Result Value   BUN 48 (*)    Creatinine, Ser 2.30 (*)    All other components within normal limits  I-STAT ARTERIAL BLOOD GAS, ED - Abnormal; Notable for the following components:   pCO2 arterial 52.4 (*)    pO2, Arterial 43 (*)    Bicarbonate 32.1 (*)    TCO2 34 (*)    Acid-Base Excess 6.0 (*)    All other components within normal limits  CBG MONITORING, ED - Abnormal; Notable for the following components:   Glucose-Capillary 132 (*)    All other components within normal limits  URINE CULTURE  CULTURE, BLOOD (ROUTINE X 2)  CULTURE, BLOOD (ROUTINE X 2)  LACTIC ACID, PLASMA  CBC WITH DIFFERENTIAL/PLATELET  PROTIME-INR  APTT  AMMONIA  LACTIC ACID, PLASMA  COMPREHENSIVE METABOLIC PANEL  URINALYSIS, ROUTINE W REFLEX MICROSCOPIC  CK  RAPID URINE DRUG SCREEN, HOSP PERFORMED  BLOOD GAS, ARTERIAL  CBG MONITORING, ED  I-STAT VENOUS BLOOD GAS, ED    EKG EKG Interpretation  Date/Time:  Monday May 13 2021 15:32:32 EDT Ventricular Rate:  83 PR Interval:  60 QRS Duration: 108 QT Interval:  360 QTC Calculation: 423 R Axis:   -63 Text Interpretation: Sinus rhythm Short PR interval Left ventricular hypertrophy Inferior infarct, old Anterior infarct, old Confirmed by Lennice Sites [phone]) on 05/13/2021 3:36:54 PM  Radiology CT HEAD WO CONTRAST (5MM)  Result Date: 05/13/2021 CLINICAL DATA:  Altered mental status. EXAM: CT HEAD WITHOUT CONTRAST TECHNIQUE: Contiguous axial images were obtained from the base of the skull  through the vertex without intravenous contrast. COMPARISON:  September 19, 2010. FINDINGS: Brain: No evidence of acute infarction, hemorrhage, hydrocephalus, extra-axial collection or mass lesion/mass effect. Vascular: No hyperdense vessel or unexpected calcification. Skull: Normal. Negative for fracture or focal lesion. Sinuses/Orbits: No acute finding. Other: None. IMPRESSION: No acute intracranial abnormality seen. Electronically Signed   By: Marijo Conception M.D.   On: 05/13/2021 14:56   CT Cervical Spine Wo Contrast  Result Date: 05/13/2021 CLINICAL DATA:  Poly trauma, critical, head/cervical spine injury suspected. EXAM: CT CERVICAL SPINE WITHOUT CONTRAST TECHNIQUE: Multidetector CT imaging of the cervical spine was performed without intravenous contrast. Multiplanar CT image reconstructions were also generated. COMPARISON:  None. FINDINGS: Alignment: No malalignment. Skull base and vertebrae: Limited detail because of shoulder density. No evidence of regional fracture or focal lesion. Soft tissues and spinal canal: Endotracheal intubation. Disc levels: Chronic facet arthropathy on the right at C6-7 and C7-T1 and on the left at C4-5. Mild spondylosis C5-6 and C6-7. Mild bilateral bony foraminal stenosis C4-5, C5-6 and C6-7. Upper chest: Negative Other: None IMPRESSION: No acute traumatic finding. Ordinary cervical spondylosis and facet arthritis as described above. Electronically Signed   By: Nelson Chimes M.D.   On: 05/13/2021 14:56   DG Chest Port 1 View  Result Date: 05/13/2021 CLINICAL DATA:  Questionable sepsis - evaluate for abnormality EXAM: PORTABLE CHEST 1 VIEW COMPARISON:  None. FINDINGS: There is an endotracheal tube overlying the midthoracic trachea, 2.8 cm above the carina. Cardiomediastinal silhouette is within normal limits. There is no focal airspace consolidation. There is no large pleural effusion or visible pneumothorax. There is no acute osseous abnormality. IMPRESSION: Endotracheal  tube tip overlies the midthoracic trachea. No evidence of acute cardiopulmonary disease. Electronically  Signed   By: Maurine Simmering M.D.   On: 05/13/2021 14:16    Procedures .Critical Care Performed by: Lennice Sites, DO Authorized by: Lennice Sites, DO   Critical care provider statement:    Critical care time (minutes):  50   Critical care time was exclusive of:  Separately billable procedures and treating other patients   Critical care was necessary to treat or prevent imminent or life-threatening deterioration of the following conditions:  CNS failure or compromise   Critical care was time spent personally by me on the following activities:  Blood draw for specimens, development of treatment plan with patient or surrogate, examination of patient, discussions with primary provider, evaluation of patient's response to treatment, obtaining history from patient or surrogate, ordering and performing treatments and interventions, ordering and review of laboratory studies, ordering and review of radiographic studies, pulse oximetry, re-evaluation of patient's condition and review of old charts   Care discussed with: admitting provider   Procedure Name: Intubation Date/Time: 05/13/2021 3:38 PM Performed by: Lennice Sites, DO Pre-anesthesia Checklist: Patient identified Oxygen Delivery Method: Nasal cannula Preoxygenation: Pre-oxygenation with 100% oxygen Induction Type: Rapid sequence Ventilation: Mask ventilation without difficulty Laryngoscope Size: Glidescope and 4 Grade View: Grade I Tube size: 7.5 mm Number of attempts: 1 Airway Equipment and Method: Video-laryngoscopy and Rigid stylet Placement Confirmation: ETT inserted through vocal cords under direct vision, Positive ETCO2 and Breath sounds checked- equal and bilateral Secured at: 22 cm Tube secured with: ETT holder Dental Injury: Teeth and Oropharynx as per pre-operative assessment  Difficulty Due To: Difficulty was  unanticipated Future Recommendations: Recommend- induction with short-acting agent, and alternative techniques readily available      Medications Ordered in ED Medications  propofol (DIPRIVAN) 1000 MG/100ML infusion (has no administration in time range)  dextrose 50 % solution (  Given 05/13/21 1330)  naloxone (NARCAN) injection 2 mg (2 mg Intravenous Given 05/13/21 1339)    ED Course  I have reviewed the triage vital signs and the nursing notes.  Pertinent labs & imaging results that were available during my care of the patient were reviewed by me and considered in my medical decision making (see chart for details).    MDM Rules/Calculators/A&P                           Kimberly Gross is a 70 year old female with history of diabetes, CKD who presents to the ED with altered mental status.  Patient arrives obtunded with a GCS of 3.  Was last seen yesterday morning by family.  She actually arrives with normal vitals.  Normal blood pressure, rectal temperature is normal.  Heart rate within normal limits then 100% on room air.  She appears to have increased secretions.  EMS found her with a blood sugar in the 30s.  They were unable to get IV access and gave her IM glucagon.  Blood sugar was 75 with them prior to arrival.  Blood sugar is in the 70s upon arrival here.  Access was obtained the patient was given an amp of D50.  Mental status did not improve.  She was given Narcan with no improvement as well.  She was therefore intubated due to inability to protect her airway.  She was taken for head CT that showed no head bleed.  Blood gas was unremarkable.  No hypercarbia or acidosis.  No leukocytosis, normal lactic acid.  Creatinine at baseline.  Overall suspect likely seizures and postictal  state likely related to hypoglycemia.  I have called neurology to help arrange for stat EEG.  She does not appear to have any seizure-like activity on exam.  She is sedated and intubated now with normal  hemodynamics.  To be admitted to the ICU team for further care.  This chart was dictated using voice recognition software.  Despite best efforts to proofread,  errors can occur which can change the documentation meaning.   Final Clinical Impression(s) / ED Diagnoses Final diagnoses:  Hypoglycemia  Altered mental status, unspecified altered mental status type  Acute respiratory failure with hypoxia [facility])    Rx / DC Orders ED Discharge Orders     None        Lennice Sites, DO 05/13/21 1545

## 2021-05-13 NOTE — ED Notes (Signed)
Paged critical care to notify of CBG of 64. Critical care returned page and they stated they will be putting in orders and a verbal order to increase D10 infusion to 36ml/hr.

## 2021-05-13 NOTE — H&P (Signed)
NAME:  Kimberly Gross, MRN:  [id number], DOB:  11/06/1950, LOS: 0 ADMISSION DATE:  05/13/2021, CONSULTATION DATE:  05/13/21 REFERRING MD:  Lockie Mola - EM, CHIEF COMPLAINT:  AMS    History of Present Illness:  70 yo F PMH CKD IV, DM, bipolar disorder, HTN, Osa on CPAP who was found obtunded in bed by family member 10/31 afternoon. Patient last known normal 10/30 AM. Lives with family, when family noticed she was slow to wake up 10/31 they went to find her and then called EMS. CBG was 30 for EMS, improved to 75 after glucagon  In ED, pt mentation did not improve with glucagon. Also received Narcan without improvement. Pt GCS 3, and was intubated. CT H was without acute abnormality. CBC is normal. ABG 7.39/52/43. LA 1.1  CMP is pending at time of consultation.   CCM consulted for admission in this setting.     Pt daughter at bedside and does not have much history regarding events of today and yesterday. She does note that pt has had hypoglycemic episodes recently but was AAO during these. She also references recently starting back on her medications 2wks ago after a lapse (it is unclear which medications)   Pertinent  Medical History  CKD IV Bipolar Disorder DM2  HLD Anxiety HTN OSA on CPAP  Significant Hospital Events: Including procedures, antibiotic start and stop dates in addition to other pertinent events   10/31 Obtunded and hypoglycemic for EMS, mentation did not improve with glucagon. Intubated in ED, CT H non acute. Admitting to PCCM   Interim History / Subjective:  Intubated in ED and has been on 35 prop since.  I shut this off   Awaiting admission labs   Objective   Height 5\' 4"  (1.626 m), weight 120.2 kg, SpO2 100 %.    Vent Mode: PRVC FiO2 (%):  [100 %] 100 % Set Rate:  [18 bmp] 18 bmp Vt Set:  [520 mL] 520 mL PEEP:  [5 cmH20] 5 cmH20 Plateau Pressure:  [20 cmH20] 20 cmH20  No intake or output data in the 24 hours ending 05/13/21 1525 Filed Weights    05/13/21 1400  Weight: 120.2 kg    Examination: General: Chronically and critically ill appearing obese older adult F intubated off sedation NAD  HENT: NCAT redundant neck tissue. ETT secure. Anicteric sclera   Lungs: CTAb. Symmetrical chest expansion,, mechanically ventilated  Cardiovascular: rrr s1s2 cap refill < 3 sec.  Abdomen: Obese soft ndnt + bowel sounds  Extremities: 2+ BLE edema. No acute joint deformity. No cyanosis or clubbing  Neuro: PERRL 35mm. + cough, weak. -corneals. Disconjugate gaze. Some fine twitching / tremor R neck LUE.  GU: wnl   Resolved Hospital Problem list     Assessment & Plan:   Acute metabolic encephalopathy -hypoglycemia, ? Other metabolic abnormality -- cmp still pending. CBC argues against infection. -?possible post ictal state, hypoglycemia mediated seizure. LA normal which could argue against seizure. CK is high however  -home meds: VPA, trazodone, clonazepam -- ? If taken incorrectly vs if has run out and lowered sz threshold -'diet changes' but family cant elaborate -- ? Nutrition deficiency P -minimize CNS depressing meds as able -spot EEG -check b12 level, will start some thiamine -follow up CMP when results  -UDS  -Need med rec to figure out what she is actually taking   Acute respiratory failure with hypoxia P -full MV support -VAP, pulm hygiene -PAD for RASS goal  0 -- sedation currently off.  CKD IV  Elevated CK  P -trend CK, renal indices  -Foley -strict I/O   DM2  Hypoglycemia P -D10 infusion -q1hr CBG   HTN HLD P -hold home meds   Best Practice (right click and "Reselect all SmartList Selections" daily)   Diet/type: NPO DVT prophylaxis: prophylactic heparin  GI prophylaxis: PPI Lines: N/A Foley:  N/A Code Status:  full code Last date of multidisciplinary goals of care discussion [pending. Daughter updated at bedside 10/31]  Labs   CBC: Recent Labs  Lab 05/13/21 1340 05/13/21 1342 05/13/21 1343  WBC  6.1  --   --   NEUTROABS 4.5  --   --   HGB 13.2 14.3 14.3  HCT 42.7 42.0 42.0  MCV 94.7  --   --   PLT 301  --   --     Basic Metabolic Panel: Recent Labs  Lab 05/13/21 1342 05/13/21 1343  NA 140 140  K 4.6 4.6  CL 103  --   GLUCOSE 72  --   BUN 48*  --   CREATININE 2.30*  --    GFR: Estimated Creatinine Clearance: 29.1 mL/min (A) (by C-G formula based on SCr of 2.3 mg/dL (H)). Recent Labs  Lab 05/13/21 1340  WBC 6.1    Liver Function Tests: No results for input(s): AST, ALT, ALKPHOS, BILITOT, PROT, ALBUMIN in the last 168 hours. No results for input(s): LIPASE, AMYLASE in the last 168 hours. No results for input(s): AMMONIA in the last 168 hours.  ABG    Component Value Date/Time   PHART 7.395 05/13/2021 1343   PCO2ART 52.4 (H) 05/13/2021 1343   PO2ART 43 (L) 05/13/2021 1343   HCO3 32.1 (H) 05/13/2021 1343   TCO2 34 (H) 05/13/2021 1343   O2SAT 77.0 05/13/2021 1343     Coagulation Profile: Recent Labs  Lab 05/13/21 1340  INR 1.0    Cardiac Enzymes: No results for input(s): CKTOTAL, CKMB, CKMBINDEX, TROPONINI in the last 168 hours.  HbA1C: Hgb A1c MFr Bld  Date/Time Value Ref Range Status  09/12/2020 11:52 AM 5.1 4.8 - 5.6 % Final    Comment:             Prediabetes: 5.7 - 6.4          Diabetes: >6.4          Glycemic control for adults with diabetes: <7.0   03/06/2020 12:12 PM 8.3 (H) 4.8 - 5.6 % Final    Comment:             Prediabetes: 5.7 - 6.4          Diabetes: >6.4          Glycemic control for adults with diabetes: <7.0     CBG: Recent Labs  Lab 05/13/21 1332 05/13/21 1359  GLUCAP 75 132*    Review of Systems:   Unable to obtain  Intubated   Past Medical History:  She,  has a past medical history of Anemia, Anxiety, Bipolar 1 disorder ([place]), CKD (chronic kidney disease), stage IV ([place]), Depression, Diabetes mellitus without complication ([place]), Hyperlipemia, Hypertension, and Occasional tremors.   Surgical History:   Past  Surgical History:  Procedure Laterality Date   CATARACT EXTRACTION, BILATERAL  2021   VAGINAL HYSTERECTOMY       Social History:   reports that she has never smoked. She has never used smokeless tobacco. She reports that she does not drink alcohol and does not use drugs.   Family History:  Her family history includes Heart attack in her father; Hypertension in her mother. There is no history of Breast cancer.   Allergies Allergies  Allergen Reactions   Chlorpromazine Rash     Home Medications  Prior to Admission medications   Medication Sig Start Date End Date Taking? Authorizing Provider  ACCU-CHEK AVIVA PLUS test strip TEST BLOOD SUGAR TWICE DAILY 10/13/19   [name], FNP  Accu-Chek Softclix Lancets lancets USE AS DIRECTED  TO CHECK BLOOD GLUCOSE 11/22/18   [name], FNP  acetaminophen (TYLENOL) 500 MG tablet Take 1,000 mg by mouth 2 (two) times a day.    [provider]  Alcohol Swabs (B-D SINGLE USE SWABS REGULAR) PADS Use as directed to check blood sugars twice daily 08/01/19   [name], FNP  amLODipine (NORVASC) 10 MG tablet TAKE 1 TABLET BY MOUTH EVERY DAY 01/15/21   [name], FNP  aspirin 81 MG tablet Take 81 mg by mouth daily.  05/01/15   [provider]  atorvastatin (LIPITOR) 40 MG tablet TAKE 1 TABLET BY MOUTH EVERYDAY AT BEDTIME 10/11/20   [name], FNP  BIOTIN PO Take 1 tablet by mouth daily at 12 noon.    [provider]  Blood Glucose Monitoring Suppl (ACCU-CHEK AVIVA PLUS) w/Device KIT Check blood sugar twice a day 09/23/18   [name], FNP  carvedilol (COREG) 25 MG tablet Take 1 tablet (25 mg total) by mouth 2 (two) times daily. 04/10/20 12/26/21  Elouise Munroe, MD  cholecalciferol (VITAMIN D3) 25 MCG (1000 UNIT) tablet Take 1,000 Units by mouth daily.    [provider]  clonazePAM (KLONOPIN) 0.5 MG tablet Take 1 tablet (0.5 mg total) by mouth 2 (two) times daily. 02/27/20   [name], FNP  divalproex  (DEPAKOTE) 500 MG DR tablet 500 mg. 2 pills 2 times per day 04/09/15   [provider]  DM-APAP-CPM (CORICIDIN HBP PO) Take by mouth as needed.    [provider]  fenofibrate 160 MG tablet TAKE 1 TABLET BY MOUTH EVERY DAY 12/26/20   [name], FNP  ferrous sulfate 325 (65 FE) MG tablet Take 325 mg by mouth daily with breakfast.    [provider]  furosemide (LASIX) 20 MG tablet Take 1 tablet (20 mg total) by mouth daily. Take one tablet daily 03/29/21   Elouise Munroe, MD  hydrALAZINE (APRESOLINE) 100 MG tablet Take 1 tablet (100 mg total) by mouth 3 (three) times daily. 10/24/20   Elouise Munroe, MD  Insulin Pen Needle (DROPLET PEN NEEDLES) 32G X 4 MM MISC USE WITH XULTOPHY ONE TIME DAILY. 07/26/19   Glendale Chard, MD  Multiple Vitamins-Minerals (MULTIVITAMIN ADULT PO) Use as directed 1 tablet in the mouth or throat daily. 04/27/15   [provider]  traZODone (DESYREL) 50 MG tablet  10/26/19   [provider]  vitamin B-12 (CYANOCOBALAMIN) 250 MCG tablet Take 250 mcg by mouth daily.    [provider]  XULTOPHY 100-3.6 UNIT-MG/ML SOPN INJECT 30 UNITS INTO THE SKIN DAILY. 10/25/20   [name], FNP     Critical care time: 54 minutes      CRITICAL CARE Performed by: Cristal Generous   Total critical care time: 54 minutes  Critical care time was exclusive of separately billable procedures and treating other patients. Critical care was necessary to treat or prevent imminent or life-threatening deterioration.  Critical care was time spent personally by me on the following activities: development of treatment plan  with patient and/or surrogate as well as nursing, discussions with consultants, evaluation of patient's response to treatment, examination of patient, obtaining history from patient or surrogate, ordering and performing treatments and interventions, ordering and review of laboratory studies, ordering and review of  radiographic studies, pulse oximetry and re-evaluation of patient's condition.  Eliseo Gum MSN, AGACNP-BC [place] for pager  05/13/2021, 4:32 PM

## 2021-05-13 NOTE — ED Notes (Addendum)
Per CCM providers DC propofol at this time d/t assessing mental status

## 2021-05-13 NOTE — ED Notes (Addendum)
Patient transported to CT 

## 2021-05-13 NOTE — Progress Notes (Signed)
EEG complete - results pending 

## 2021-05-13 NOTE — Progress Notes (Signed)
   05/13/21 1435  Clinical Encounter Type  Visited With Patient;Family  Visit Type Critical Care;Spiritual support;Psychological support;Initial  Referral From Nurse  Consult/Referral To Chaplain   Chaplain received a call from the attending nurse, Raquel Sarna stating the patient's daughter is emotionally distraught in Consultation room A. Chaplain greeted the patient's daughter, Kimberly Gross. Chaplain established a rapport with Kisha. Chaplain encouraged her to share her feelings. Kimberly Gross described seeing her mother on "breathing tubes," It is upsetting because her father died a few years ago this month. I followed up with Raquel Sarna and continued to provide emotional and spiritual support to [place]. Kimberly Gross said her mother mentioned that death comes after a baby is born. The patient has a new 85-day-old great-granddaughter. Kimberly Gross is afraid her mother knows she is at the end of her life. The chaplain asked open-ended questions to understand Kimberly Gross and the patient's faith and beliefs. Kimberly Gross stated she was not feeling well because she had diabetes and had not eaten. Chaplain escorted Kimberly Gross to [employer], and we returned to the consultation room. Chaplain prayed with Kimberly Gross and then escorted her to her mother's bedside. Specialty doctors are also at the bedside. This note was prepared by Jeanine Luz, M.Div..  For questions please contact by phone [phone].

## 2021-05-13 NOTE — ED Notes (Signed)
Attempted report x1. 

## 2021-05-14 ENCOUNTER — Inpatient Hospital Stay (HOSPITAL_COMMUNITY): Payer: Medicare (Managed Care)

## 2021-05-14 DIAGNOSIS — R4182 Altered mental status, unspecified: Secondary | ICD-10-CM | POA: Diagnosis not present

## 2021-05-14 DIAGNOSIS — J9602 Acute respiratory failure with hypercapnia: Secondary | ICD-10-CM | POA: Diagnosis present

## 2021-05-14 DIAGNOSIS — G9341 Metabolic encephalopathy: Secondary | ICD-10-CM | POA: Diagnosis not present

## 2021-05-14 DIAGNOSIS — J9601 Acute respiratory failure with hypoxia: Secondary | ICD-10-CM

## 2021-05-14 LAB — PROTEIN, CSF: Total  Protein, CSF: 33 mg/dL (ref 15–45)

## 2021-05-14 LAB — GLUCOSE, CAPILLARY
Glucose-Capillary: 101 mg/dL — ABNORMAL HIGH (ref 70–99)
Glucose-Capillary: 103 mg/dL — ABNORMAL HIGH (ref 70–99)
Glucose-Capillary: 118 mg/dL — ABNORMAL HIGH (ref 70–99)
Glucose-Capillary: 136 mg/dL — ABNORMAL HIGH (ref 70–99)
Glucose-Capillary: 151 mg/dL — ABNORMAL HIGH (ref 70–99)
Glucose-Capillary: 180 mg/dL — ABNORMAL HIGH (ref 70–99)
Glucose-Capillary: 194 mg/dL — ABNORMAL HIGH (ref 70–99)
Glucose-Capillary: 204 mg/dL — ABNORMAL HIGH (ref 70–99)
Glucose-Capillary: 69 mg/dL — ABNORMAL LOW (ref 70–99)
Glucose-Capillary: 74 mg/dL (ref 70–99)
Glucose-Capillary: 96 mg/dL (ref 70–99)
Glucose-Capillary: 99 mg/dL (ref 70–99)

## 2021-05-14 LAB — CSF CELL COUNT WITH DIFFERENTIAL
RBC Count, CSF: 4 /mm3 — ABNORMAL HIGH
Tube #: 1
WBC, CSF: 2 /mm3 (ref 0–5)

## 2021-05-14 LAB — URINE CULTURE

## 2021-05-14 LAB — MAGNESIUM: Magnesium: 2.1 mg/dL (ref 1.7–2.4)

## 2021-05-14 LAB — BASIC METABOLIC PANEL
Anion gap: 12 (ref 5–15)
BUN: 40 mg/dL — ABNORMAL HIGH (ref 8–23)
CO2: 24 mmol/L (ref 22–32)
Calcium: 8.5 mg/dL — ABNORMAL LOW (ref 8.9–10.3)
Chloride: 98 mmol/L (ref 98–111)
Creatinine, Ser: 2.46 mg/dL — ABNORMAL HIGH (ref 0.44–1.00)
GFR, Estimated: 21 mL/min — ABNORMAL LOW (ref >60)
Glucose, Bld: 95 mg/dL (ref 70–99)
Potassium: 4.8 mmol/L (ref 3.5–5.1)
Sodium: 134 mmol/L — ABNORMAL LOW (ref 135–145)

## 2021-05-14 LAB — RESP PANEL BY RT-PCR (FLU A&B, COVID) ARPGX2
Influenza A by PCR: NEGATIVE
Influenza B by PCR: NEGATIVE
SARS Coronavirus 2 by RT PCR: NEGATIVE

## 2021-05-14 LAB — MRSA NEXT GEN BY PCR, NASAL: MRSA by PCR Next Gen: NOT DETECTED

## 2021-05-14 LAB — GLUCOSE, CSF: Glucose, CSF: 80 mg/dL — ABNORMAL HIGH (ref 40–70)

## 2021-05-14 LAB — TRIGLYCERIDES: Triglycerides: 148 mg/dL (ref <150)

## 2021-05-14 LAB — HIV ANTIBODY (ROUTINE TESTING W REFLEX): HIV Screen 4th Generation wRfx: NONREACTIVE

## 2021-05-14 LAB — CK: Total CK: 1948 U/L — ABNORMAL HIGH (ref 38–234)

## 2021-05-14 IMAGING — RF DG FLUORO GUIDE SPINAL/SI JT INJ*L*
3 series · 4 of 4 positions shown · non-contrast
Comparison: None

CLINICAL DATA: Patient presented to the ED with altered mental
status. Radiology asked to perform a diagnostic lumbar puncture.

EXAM:
DIAGNOSTIC LUMBAR PUNCTURE UNDER FLUOROSCOPIC GUIDANCE

[Series 1: cp_standard · 0.18mm/px · 1 of 1 slices shown (1 of 2)]
[im 1/1]
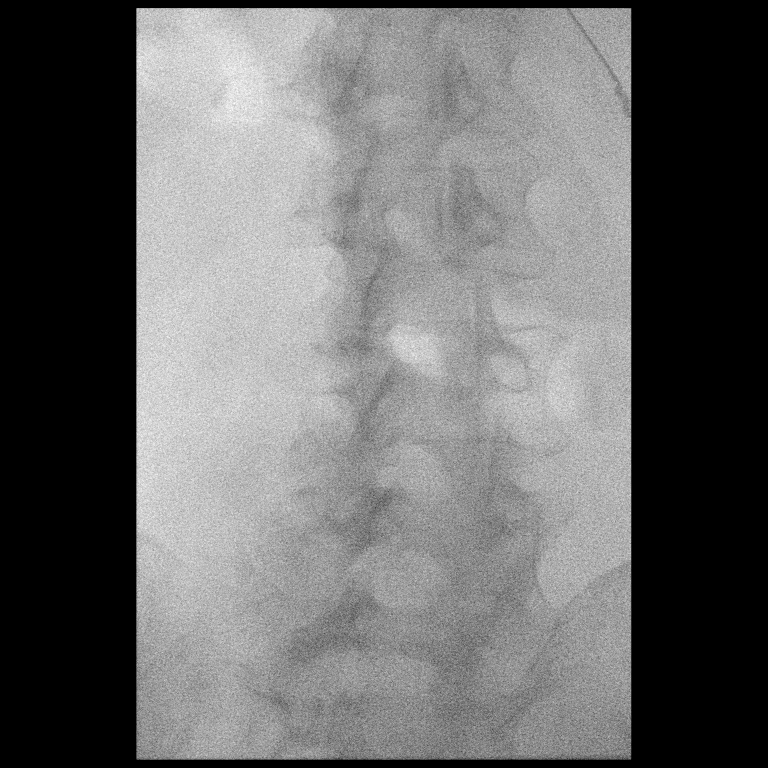

[Series 2: fluoro_iodine 2fps_bw · 0.18mm/px · 2 of 2 frames shown]
[frame 1/2]
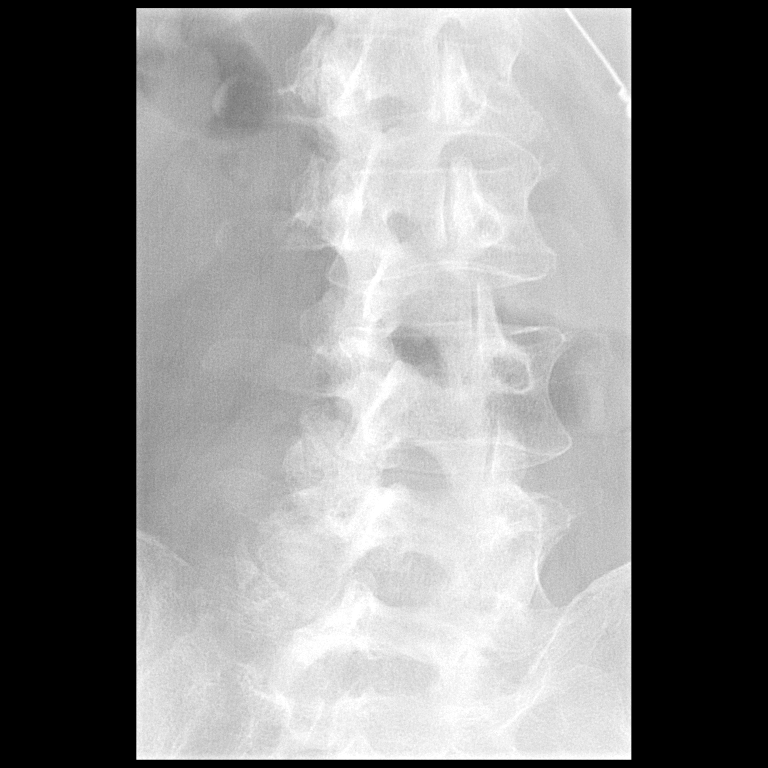
[frame 2/2]
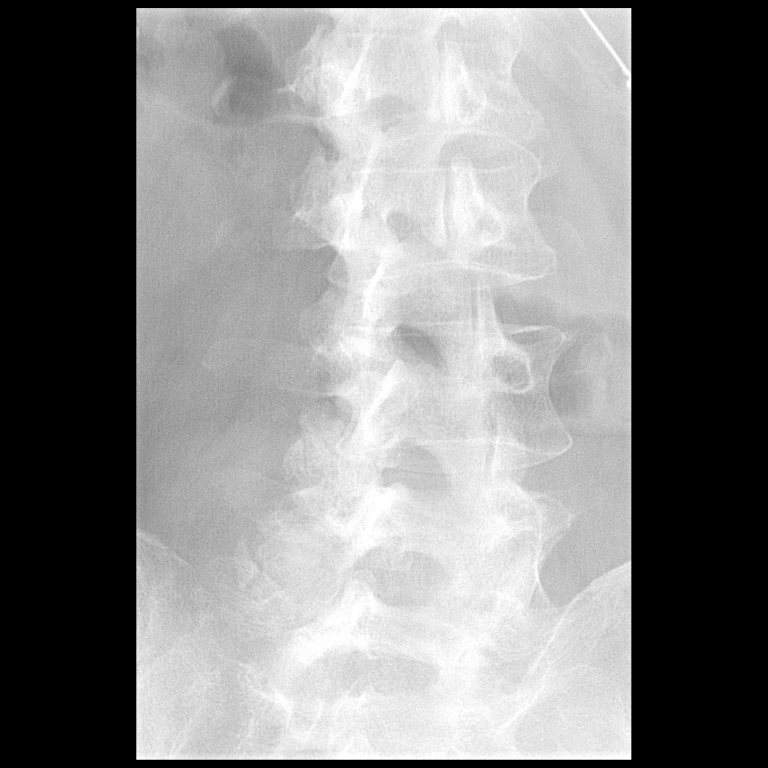

[Series 3: cp_standard · 0.17mm/px · 1 of 1 slices shown (2 of 2)]
[im 1/1]
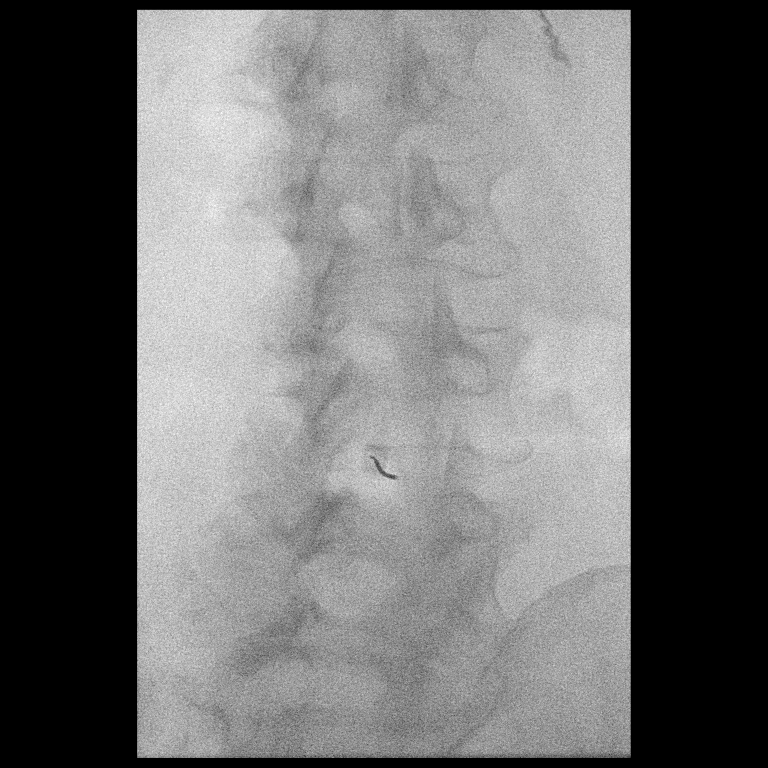

[4 of 4 positions shown; findings below may reference images not displayed]

FLUOROSCOPY TIME:  Fluoroscopy Time:  24 seconds

Radiation Exposure Index (if provided by the fluoroscopic device):
19.1 mGy

Number of Acquired Spot Images: 1

PROCEDURE:
Informed consent was obtained from the patient's daughter prior to
the procedure, including potential complications of headache,
allergy, and pain. With the patient prone, the lower back was
prepped with Betadine. 1% Lidocaine was used for local anesthesia.
Lumbar puncture was performed at the L3-L4 level using a 20 gauge
needle with return of clear CSF with an opening pressure of 20 cm
water. 10 ml of CSF were obtained for laboratory studies. The
patient tolerated the procedure well and there were no apparent
complications.
IMPRESSION: Technically successful L3-L4 lumbar puncture yielding 10 mL of CSF
for laboratory studies. Read by: PENDERGAST, NP. Exam
supervised by PENDERGAST, m.d.

## 2021-05-14 IMAGING — MR MR HEAD W/O CM
9 of 13 series · 19 of 48 positions shown · non-contrast
Comparison: None.

CLINICAL DATA: Seizure, abnormal neuro exam

EXAM:
MRI HEAD WITHOUT CONTRAST
TECHNIQUE: Multiplanar, multiecho pulse sequences of the brain and surrounding
structures were obtained without intravenous contrast.

[Series 4: DWI · axial · 3.0mm · 0.94mm/px · z∈[-123,+26]mm · 4 of 101 slices shown (1 of 2)]
[im 1/101]
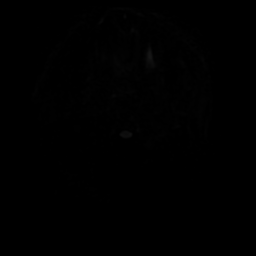
[im 34/101]
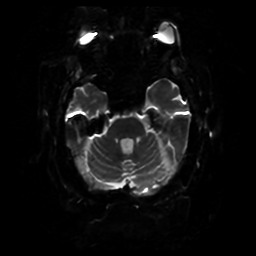
[im 67/101]
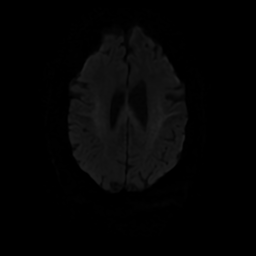
[im 101/101]
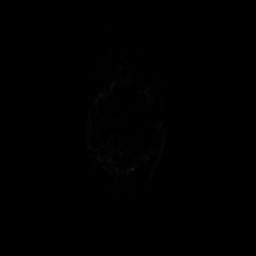

[Series 5: DWI · coronal · 4.0mm · 0.94mm/px · 3 of 70 slices shown (2 of 2)]
[im 1/70]
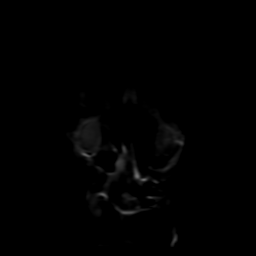
[im 35/70]
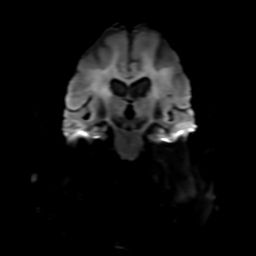
[im 70/70]
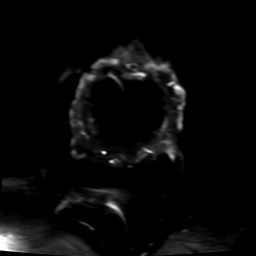

[Series 6: FLAIR · sagittal · 5.0mm · 0.23mm/px · 1 of 23 slices shown (1 of 3)]
[im 1/23]
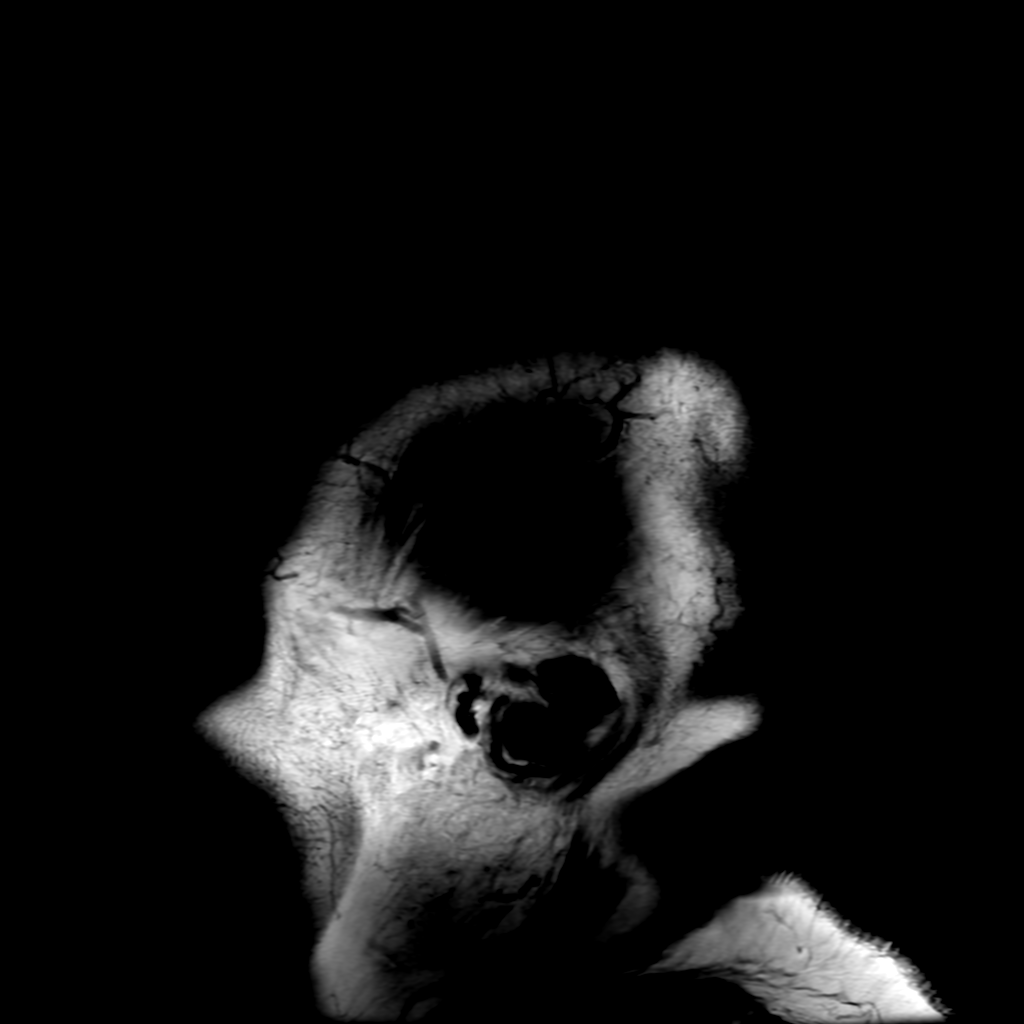

[Series 7: T2 · axial · 5.0mm · 0.23mm/px · 1 of 26 slices shown (1 of 2)]
[im 1/26]
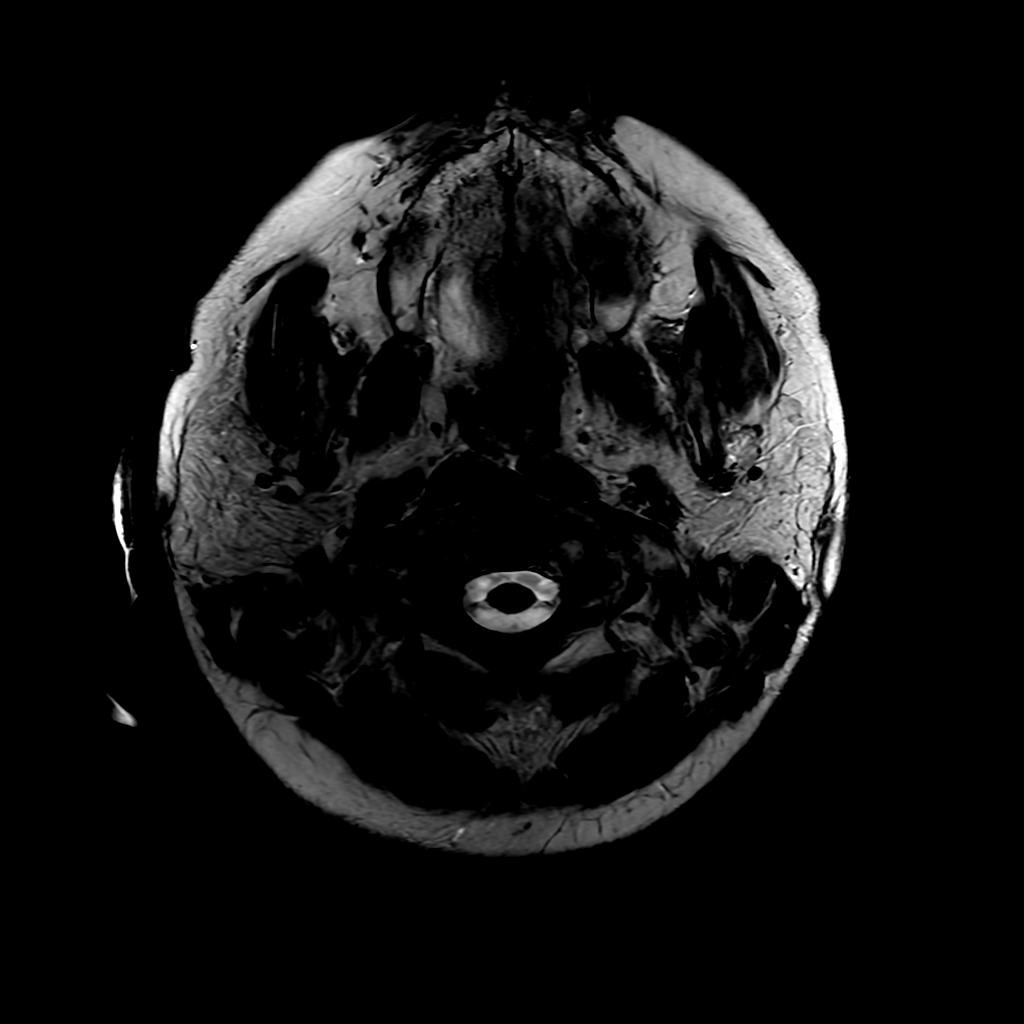

[Series 8: FLAIR · axial · 4.0mm · 0.45mm/px · z∈[-113,+36]mm · 2 of 35 slices shown (2 of 3)]
[im 1/35]
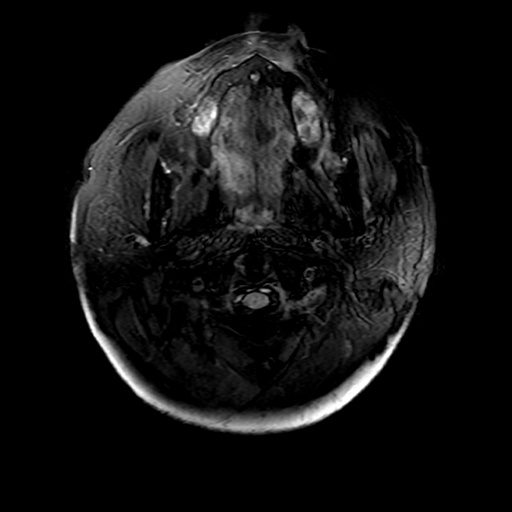
[im 35/35]
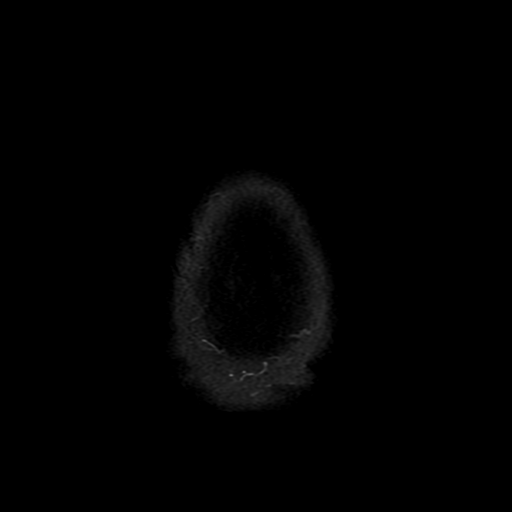

[Series 10: T2 · coronal · 5.0mm · 0.20mm/px · 1 of 30 slices shown (2 of 2)]
[im 1/30]
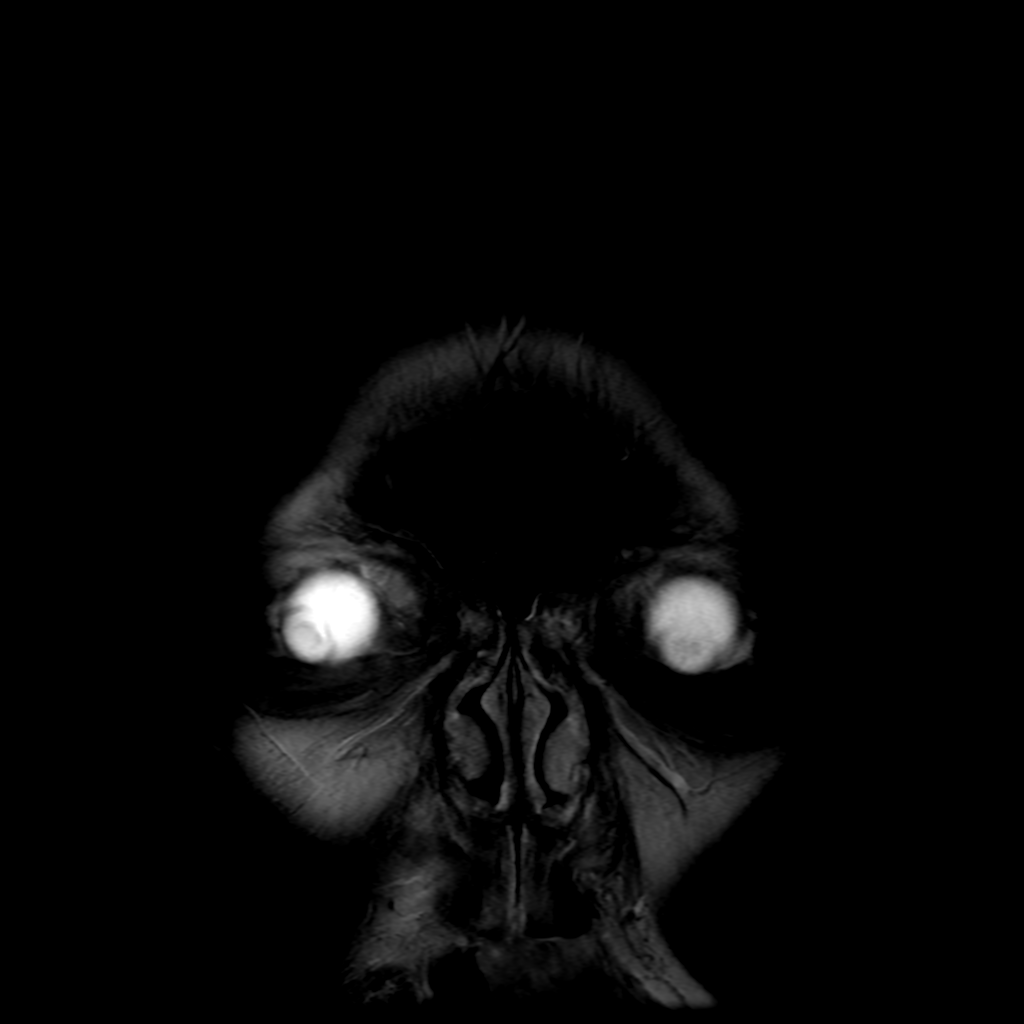

[Series 12: FLAIR · coronal · 3.0mm · 0.39mm/px · 2 of 31 slices shown (3 of 3)]
[im 1/31]
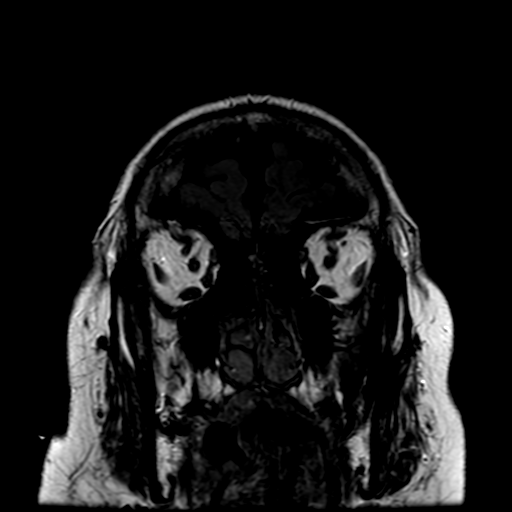
[im 31/31]
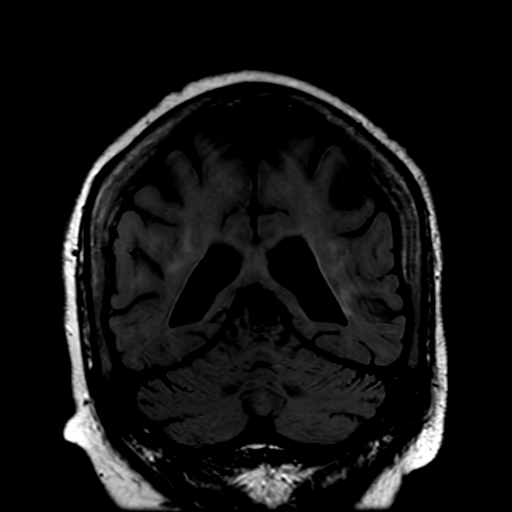

[Series 450: ADC · axial · 3.0mm · 0.94mm/px · z∈[-123,+26]mm · 3 of 51 slices shown (1 of 2)]
[im 1/51]
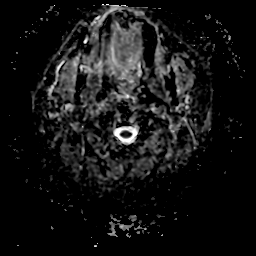
[im 26/51]
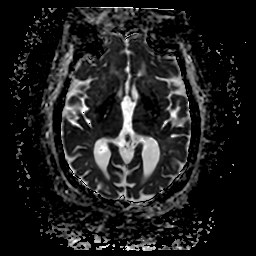
[im 51/51]
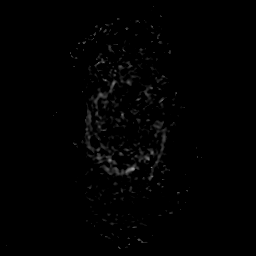

[Series 550: ADC · coronal · 4.0mm · 0.94mm/px · 2 of 34 slices shown (2 of 2)]
[im 1/34]
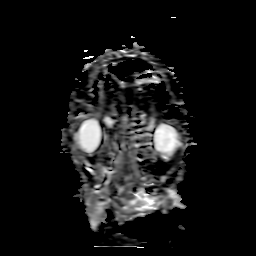
[im 34/34]
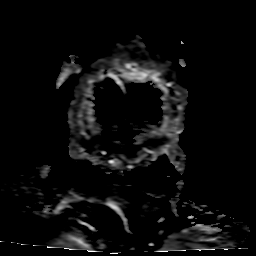

[19 of 48 positions shown; findings below may reference images not displayed]

FINDINGS: Brain: There is no acute infarction or intracranial hemorrhage.
There is no intracranial mass, mass effect, or edema. There is no
hydrocephalus or extra-axial fluid collection. Patchy and confluent
areas of T2 hyperintensity in the supratentorial white matter are
nonspecific but may reflect mild to moderate chronic microvascular
ischemic changes. Prominence of the ventricles and sulci reflects
parenchymal volume loss.

Vascular: Major vessel flow voids at the skull base are preserved.

Skull and upper cervical spine: Normal marrow signal is preserved.

Sinuses/Orbits: Minor mucosal thickening.  Orbits are unremarkable.

Other: Sella is unremarkable. Mastoid air cells are clear. Retained
secretions included pharynx.
IMPRESSION: No evidence of recent infarction, hemorrhage, or mass. Chronic
microvascular ischemic changes.

## 2021-05-14 MED ORDER — VITAL 1.5 CAL PO LIQD
1000.0000 mL | ORAL | Status: DC
  Administered 2021-05-15 – 2021-05-23 (×8): 1000 mL
  Filled 2021-05-14 (×5): qty 1000

## 2021-05-14 MED ORDER — INSULIN ASPART 100 UNIT/ML IJ SOLN
1.0000 [IU] | INTRAMUSCULAR | Status: DC
Start: 2021-05-14 — End: 2021-05-15
  Administered 2021-05-14: 2 [IU] via SUBCUTANEOUS
  Administered 2021-05-15: 1 [IU] via SUBCUTANEOUS
  Administered 2021-05-15: 2 [IU] via SUBCUTANEOUS

## 2021-05-14 MED ORDER — LIDOCAINE HCL (PF) 1 % IJ SOLN
5.0000 mL | Freq: Once | INTRAMUSCULAR | Status: AC
  Administered 2021-05-14: 3 mL via INTRADERMAL

## 2021-05-14 MED ORDER — DEXTROSE 50 % IV SOLN
INTRAVENOUS | Status: AC
  Filled 2021-05-14: qty 50

## 2021-05-14 MED ORDER — CHLORHEXIDINE GLUCONATE CLOTH 2 % EX PADS
6.0000 | MEDICATED_PAD | Freq: Every day | CUTANEOUS | Status: DC
  Administered 2021-05-13 – 2021-06-13 (×33): 6 via TOPICAL

## 2021-05-14 MED ORDER — PROSOURCE TF PO LIQD
45.0000 mL | Freq: Three times a day (TID) | ORAL | Status: DC
  Administered 2021-05-14 – 2021-06-13 (×87): 45 mL
  Filled 2021-05-14 (×89): qty 45

## 2021-05-14 MED ORDER — DEXTROSE 50 % IV SOLN
12.5000 g | INTRAVENOUS | Status: AC
  Administered 2021-05-14: 12.5 g via INTRAVENOUS

## 2021-05-14 MED ORDER — VITAL HIGH PROTEIN PO LIQD
1000.0000 mL | ORAL | Status: AC
  Administered 2021-05-14: 1000 mL

## 2021-05-14 NOTE — Procedures (Signed)
Patient Name: Kimberly Gross  MRN: [id number]  Epilepsy Attending: Lora Havens  Referring Physician/Provider: Dr Lennice Sites Date: 05/13/2021  Duration: 23.02 mins  Patient history: 70 year old female with CKD stage IV, diabetes and bipolar disorder who was brought into the emergency department with altered mental status after she was found obtunded and unresponsive at home.  EEG to evaluate for seizure.  Level of alertness: lethargic   AEDs during EEG study: None  Technical aspects: This EEG study was done with scalp electrodes positioned according to the 10-20 International system of electrode placement. Electrical activity was acquired at a sampling rate of 500Hz  and reviewed with a high frequency filter of 70Hz  and a low frequency filter of 1Hz . EEG data were recorded continuously and digitally stored.   Description: EEG showed continuous generalized predominantly to diffuse delta slowing admixed with 8 to 9 Hz intermittent generalized alpha activity.  Hyperventilation and photic stimulation were not performed.     ABNORMALITY - Continuous slow, generalized  IMPRESSION: This study is suggestive of severe diffuse encephalopathy, nonspecific etiology. No seizures or epileptiform discharges were seen throughout the recording.  Daven Montz Barbra Sarks

## 2021-05-14 NOTE — Progress Notes (Signed)
Patient transported to MRI and back without complications. RN at bedside. 

## 2021-05-14 NOTE — Progress Notes (Signed)
Initial Nutrition Assessment  DOCUMENTATION CODES:   Not applicable  INTERVENTION:   Tube feeding via OG tube: - Start Vital 1.5 @ 30 ml/hr and advance by 10 ml q 4 hours to goal rate of 50 ml/hr (1200 ml/day) - ProSource TF 45 ml TID  Tube feeding regimen at goal provides 1920 kcal, 114 grams of protein, and 917 ml of H2O.   NUTRITION DIAGNOSIS:   Inadequate oral intake related to inability to eat as evidenced by NPO status.  GOAL:   Patient will meet greater than or equal to 90% of their needs  MONITOR:   Vent status, Labs, Weight trends, TF tolerance, I & O's  REASON FOR ASSESSMENT:   Ventilator, Consult Enteral/tube feeding initiation and management  ASSESSMENT:   70 year old female who presented to the ED on 10/31 with AMS. PMH of CKD stage IV, T2DM, bipolar disorder, HTN, OSA on CPAP. Pt admitted with acute metabolic encephalopathy, acute respiratory failure with hypoxia.  Discussed pt with RN. Trickle tube feeds started this morning. Discussed with MD and okay to advance tube feeds to goal. Pt with OG tube tip and side port below GE junction.  Spoke with pt's daughter at bedside. She reports decreased PO intake recently as daughter has been occupied with new grandchild. Pt lives in a house "full of people" but they have not been making sure she has food to eat like the daughter normally does when she is there. Daughter notes pt has lost weight recently and attributes this to diet changes that were encouraged by pt's doctor's. Daughter states that pt typically weighs 280 lbs and is down to 250 lbs.  Reviewed weight history in chart. Pt with a total weight loss of 18 kg since 08/09/20. This is a 13.9% weight loss in 9 months which is not quite significant for timeframe. Per report, it appears that weight loss was intentional.  Patient is currently intubated on ventilator support MV: 8.4 L/min Temp (24hrs), Avg:100.9 F (38.3 C), Min:98 F (36.7 C), Max:101.8 F (38.8  C)  Drips: D10: 70 ml/hr LR: 50 ml/hr  Medications reviewed and include: IV protonix, IV thiamine, IV abx  Labs reviewed: sodium 134, BUN 40, creatinine 2.46 CBG's: 69-118 x 12 hours  UOP: 615 ml x 12 hours OGT: 100 ml x 12 hours I/O's: +3.0 L since admit  NUTRITION - FOCUSED PHYSICAL EXAM:  Flowsheet Row Most Recent Value  Orbital Region No depletion  Upper Arm Region No depletion  Thoracic and Lumbar Region No depletion  Buccal Region Unable to assess  Temple Region No depletion  Clavicle Bone Region No depletion  Clavicle and Acromion Bone Region Mild depletion  Scapular Bone Region Unable to assess  Dorsal Hand No depletion  Patellar Region No depletion  Anterior Thigh Region Mild depletion  Posterior Calf Region Mild depletion  Edema (RD Assessment) Mild  [BLE]  Hair Reviewed  Eyes Unable to assess  Mouth Unable to assess  Skin Reviewed  Nails Reviewed       Diet Order:   Diet Order             Diet NPO time specified  Diet effective now                   EDUCATION NEEDS:   Not appropriate for education at this time  Skin:  Skin Assessment: Reviewed RN Assessment  Last BM:  no documented BM  Height:   Ht Readings from Last 1 Encounters:  05/13/21 5'  4" (1.626 m)    Weight:   Wt Readings from Last 1 Encounters:  05/14/21 111.7 kg    Ideal Body Weight:  54.5 kg  BMI:  Body mass index is 42.27 kg/m.  Estimated Nutritional Needs:   Kcal:  1700-1900  Protein:  110-130 grams  Fluid:  1.7-1.9 L    Gustavus Bryant, MS, RD, LDN Inpatient Clinical Dietitian Please see AMiON for contact information.

## 2021-05-14 NOTE — Progress Notes (Signed)
[facility] Progress Note Patient Name: ZURIA FOSDICK DOB: 1951/06/05 MRN: [id number]   Date of Service  05/14/2021  HPI/Events of Note  hyperglycemic  eICU Interventions  Stopping D10, as pt is on tube feeds.  Also ordering an insulin sliding scale.     Intervention Category Intermediate Interventions: Hyperglycemia - evaluation and treatment  Tilden Dome 05/14/2021, 8:08 PM

## 2021-05-14 NOTE — Progress Notes (Signed)
Patient transported to IR and back without complications. RN at bedside. ?

## 2021-05-14 NOTE — Progress Notes (Signed)
NAME:  Kimberly Gross, MRN:  [id number], DOB:  1950/12/10, LOS: 1 ADMISSION DATE:  05/13/2021, CONSULTATION DATE:  05/13/21 REFERRING MD:  Ronnald Nian - EM, CHIEF COMPLAINT:  AMS    History of Present Illness:  70 yo F PMH CKD IV, DM, bipolar disorder, HTN, Osa on CPAP who was found obtunded in bed by family member 10/31 afternoon found to be severely hypoglycemic which improved with glucagon but no improvement in mental status. Intubated for GCS 3. CT head unremarkable, CBC and CMP also unremarkable.   Pertinent  Medical History  CKD IV Bipolar Disorder DM2  HLD Anxiety HTN OSA on CPAP  Significant Hospital Events: Including procedures, antibiotic start and stop dates in addition to other pertinent events   10/31 Obtunded and hypoglycemic for EMS, mentation did not improve with glucagon. Intubated in ED, CT H non acute. Admitting to PCCM   Interim History / Subjective:   Critically ill, intubated Remains obtunded, GCS 3 Febrile T-max 101.5 F EEG no epileptiform activity  Objective   Blood pressure (!) 143/64, pulse 80, temperature (!) 101.5 F (38.6 C), temperature source Bladder, resp. rate 19, height 5\' 4"  (1.626 m), weight 111.7 kg, SpO2 98 %.    Vent Mode: PRVC FiO2 (%):  [30 %-100 %] 30 % Set Rate:  [8 bmp-18 bmp] 18 bmp Vt Set:  [440 mL-520 mL] 440 mL PEEP:  [5 cmH20] 5 cmH20 Plateau Pressure:  [18 cmH20-20 cmH20] 18 cmH20   Intake/Output Summary (Last 24 hours) at 05/14/2021 0857 Last data filed at 05/14/2021 0800 Gross per 24 hour  Intake 3697.17 ml  Output 715 ml  Net 2982.17 ml   Filed Weights   05/13/21 1400 05/13/21 1906 05/14/21 0324  Weight: 120.2 kg 111.7 kg 111.7 kg    Examination: General: Chronically and critically ill appearing obese older adult F intubated, NAD  Neck: supple, no nuchal rigidity HENT: NCAT redundant neck tissue. ETT secure. Anicteric sclera   Lungs: CTAb. Symmetrical chest expansion, mechanically ventilated  Cardiovascular:  RRR, s1s2 Abdomen: Obese soft ndnt Extremities: 2+ BLE edema. No acute joint deformity. No cyanosis or clubbing  Neuro: PERRL. Fixed downward gaze bilaterally. Unable to follow 1-step commands. Downward going toes with Babinski.  Labs show mild hyponatremia, mildly increased creatinine, normal CBC, decreasing CK UDS negative Vitamin B12 elevated ABG with normal pH 7.4  CXR reviewed, reveals good ETT placement and no acute findings EEG no epileptiform activity  Resolved Hospital Problem list     Assessment & Plan:   Acute metabolic encephalopathy -prolonged hypoglycemia vs CNS infection? She remains febrile -spot EEG negative, low suspicion for seizure -UDS negative P -neurology consulted -plan for LP today -continue acyclovir, ampicillin, CTX, vancomycin meningitic dosing -minimize CNS depressing meds as able -thiamine -Need med rec to figure out what she is actually taking   Acute respiratory failure with hypoxia P -full MV support -VAP, pulm hygiene -PAD for RASS goal  0 -- sedation currently off.   CKD IV  Elevated CK  Elevated CK possibly from prolonged lie, low suspicion for seizure on EEG P -trend CK, renal indices  -Foley -strict I/O   DM2  Hypoglycemia P -D10 infusion -q1hr CBG   HTN HLD P -hold home meds   Best Practice (right click and "Reselect all SmartList Selections" daily)   Diet/type: tubefeeds and NPO, wean IV fluids with start of tube feeds DVT prophylaxis: prophylactic heparin  GI prophylaxis: PPI Lines: N/A Foley:  N/A Code Status:  full code Last  date of multidisciplinary goals of care discussion [pending. Daughter updated at bedside 11/1]  Labs   CBC: Recent Labs  Lab 05/13/21 1340 05/13/21 1342 05/13/21 1343 05/13/21 1540  WBC 6.1  --   --   --   NEUTROABS 4.5  --   --   --   HGB 13.2 14.3 14.3 12.9  HCT 42.7 42.0 42.0 38.0  MCV 94.7  --   --   --   PLT 301  --   --   --      Basic Metabolic Panel: Recent Labs   Lab 05/13/21 1340 05/13/21 1342 05/13/21 1343 05/13/21 1540 05/13/21 2119 05/14/21 0310  NA 141 140 140 140  --  134*  K 4.6 4.6 4.6 4.8  --  4.8  CL 103 103  --   --   --  98  CO2 27  --   --   --   --  24  GLUCOSE 70 72  --   --   --  95  BUN 40* 48*  --   --   --  40*  CREATININE 2.26* 2.30*  --   --   --  2.46*  CALCIUM 9.4  --   --   --   --  8.5*  MG  --   --   --   --  2.1 2.1    GFR: Estimated Creatinine Clearance: 26 mL/min (A) (by C-G formula based on SCr of 2.46 mg/dL (H)). Recent Labs  Lab 05/13/21 1340 05/13/21 1345  WBC 6.1  --   LATICACIDVEN  --  1.1     Liver Function Tests: Recent Labs  Lab 05/13/21 1340  AST 86*  ALT 28  ALKPHOS 39  BILITOT 0.8  PROT 6.6  ALBUMIN 3.5   No results for input(s): LIPASE, AMYLASE in the last 168 hours. Recent Labs  Lab 05/13/21 1349  AMMONIA 35    ABG    Component Value Date/Time   PHART 7.401 05/13/2021 1540   PCO2ART 53.0 (H) 05/13/2021 1540   PO2ART 520 (H) 05/13/2021 1540   HCO3 32.9 (H) 05/13/2021 1540   TCO2 34 (H) 05/13/2021 1540   O2SAT 100.0 05/13/2021 1540      Coagulation Profile: Recent Labs  Lab 05/13/21 1340  INR 1.0     Cardiac Enzymes: Recent Labs  Lab 05/13/21 1340 05/14/21 0310  CKTOTAL 2,377* 1,948*    HbA1C: Hgb A1c MFr Bld  Date/Time Value Ref Range Status  09/12/2020 11:52 AM 5.1 4.8 - 5.6 % Final    Comment:             Prediabetes: 5.7 - 6.4          Diabetes: >6.4          Glycemic control for adults with diabetes: <7.0   03/06/2020 12:12 PM 8.3 (H) 4.8 - 5.6 % Final    Comment:             Prediabetes: 5.7 - 6.4          Diabetes: >6.4          Glycemic control for adults with diabetes: <7.0     CBG: Recent Labs  Lab 05/14/21 0106 05/14/21 0212 05/14/21 0321 05/14/21 0424 05/14/21 0650  GLUCAP 103* 101* 96 99 118*     Critical care time: ** minutes      CRITICAL CARE Performed by: Bernita Buffy, MD PGY-2 [place]  [facility] 05/14/2021, 8:57 AM

## 2021-05-14 NOTE — Progress Notes (Signed)
Attempted LP at bedside. Unable to get due to difficult anatomy Will request radiologist to attempt with fluoro guidance  Robinette Esters V. Elsworth Soho MD

## 2021-05-14 NOTE — Consult Note (Signed)
Neurology Consultation  Reason for Consult: Altered mental status, unresponsiveness Referring Physician: Dr. Elsworth Soho  CC: Patient is intubated, unable to provide a chief complaint.   History is obtained from: Chart review, Patient's daughter at bedside. Unable to obtain history from patient due to patient's mental status.   HPI: Kimberly Gross is a 70 y.o. female with a medial history significant for chronic kidney disease stage IV, essential hypertension, hyperlipidemia, obesity with BMI of 42.27, type 2 diabetes mellitus, bipolar 1 disorder, shizophrenia, and OSA on CPAP who presented to the ED 10/31 via EMS after family found her unresponsive in bed. Patient's daughter states that family last spoke with Kimberly Gross Sunday evening 10/30 around 6-7 pm when she stated that she was tired and wanted to go to bed. Her daughter checked on her that evening and noted that she was snoring and left her to sleep. The next morning, her daughter went to run errands and assumed that her mother would be up whenever she returned. When Kimberly Gross had not gotten up by that afternoon, her daughter checked on her and noted that she was in the same position as she was the night before and she was not waking up. EMS was subsequently activated and on arrival, they noted that her initial blood glucose was in the 30's with improvement to 75 with glucagon administration but remained unresponsive requiring intubation for airway protection. Per patient's daughter, this is the 2nd time this month that she has had to call EMS for hypoglycemia and the patient has stopped taking her insulin at home due to variable blood glucose levels. She was admitted to the ICU without improvement in mental status with treatment of hypoglycemia and with Narcan administration. Overnight she began to spike fevers with a 24 hour temperature maximum of 101.64F and neurology was consulted for further evaluation of patient's unresponsiveness.   At baseline,  Kimberly Gross is able to function independently and her two daughters and their families live with her. She ambulates without assistive device, bathes herself, and manages her own medications with some help from family. The patient's daughter does state that she has recently become fatigued more easily than usual and has had less desire to cook herself meals recently and complaints of feeling cold frequently but she denies recent complaints of illness, loss of appetite, fevers, headaches, or vision changes.  ROS: Unable to obtain due to altered mental status.   Past Medical History:  Diagnosis Date   Anemia    Anxiety    Bipolar 1 disorder ([place])    CKD (chronic kidney disease), stage IV (HCC)    Depression    Diabetes mellitus without complication (HCC)    Hyperlipemia    Hypertension    Occasional tremors    Past Surgical History:  Procedure Laterality Date   CATARACT EXTRACTION, BILATERAL  2021   VAGINAL HYSTERECTOMY     Family History  Problem Relation Age of Onset   Hypertension Mother    Heart attack Father    Breast cancer Neg Hx    Social History:   reports that she has never smoked. She has never used smokeless tobacco. She reports that she does not drink alcohol and does not use drugs.  Medications  Current Facility-Administered Medications:    acetaminophen (TYLENOL) tablet 650 mg, 650 mg, Per Tube, Q4H PRN, Bowser, Laurel Dimmer, NP, 650 mg at 05/14/21 0246   acyclovir (ZOVIRAX) 810 mg in dextrose 5 % 150 mL IVPB, 10 mg/kg (Adjusted), Intravenous, Q24H,  Barr, Grace E, RPH, Stopped at 05/13/21 2121   ampicillin (OMNIPEN) 2 g in sodium chloride 0.9 % 100 mL IVPB, 2 g, Intravenous, Q8H, Bowser, Grace E, NP, Last Rate: 300 mL/hr at 05/14/21 0925, 2 g at 05/14/21 0925   cefTRIAXone (ROCEPHIN) 2 g in sodium chloride 0.9 % 100 mL IVPB, 2 g, Intravenous, Q12H, Bowser, Grace E, NP, Stopped at 05/13/21 1807   chlorhexidine gluconate (MEDLINE KIT) (PERIDEX) 0.12 % solution 15 mL, 15 mL,  Mouth Rinse, BID, Bowser, Grace E, NP, 15 mL at 05/14/21 0736   Chlorhexidine Gluconate Cloth 2 % PADS 6 each, 6 each, Topical, Daily, Chand, Sudham, MD, 6 each at 05/13/21 2000   dextrose 10 % infusion, , Intravenous, Continuous, Ollis, Brandi L, NP, Last Rate: 70 mL/hr at 05/14/21 0900, Infusion Verify at 05/14/21 0900   docusate (COLACE) 50 MG/5ML liquid 100 mg, 100 mg, Per Tube, BID PRN, Barr, Grace E, RPH   feeding supplement (VITAL HIGH PROTEIN) liquid 1,000 mL, 1,000 mL, Per Tube, Q24H, Alva, Rakesh V, MD, 1,000 mL at 05/14/21 0925   fentaNYL (SUBLIMAZE) injection 25 mcg, 25 mcg, Intravenous, Q15 min PRN, Bowser, Grace E, NP   fentaNYL (SUBLIMAZE) injection 25-100 mcg, 25-100 mcg, Intravenous, Q30 min PRN, Pierce, Dwayne A, RPH   heparin injection 5,000 Units, 5,000 Units, Subcutaneous, Q8H, Bowser, Grace E, NP, 5,000 Units at 05/13/21 1734   lactated ringers infusion, , Intravenous, Continuous, Bowser, Grace E, NP   lactated ringers infusion, , Intravenous, Continuous, Bowser, Grace E, NP, Last Rate: 50 mL/hr at 05/14/21 0900, Infusion Verify at 05/14/21 0900   MEDLINE mouth rinse, 15 mL, Mouth Rinse, 10 times per day, Bowser, Grace E, NP, 15 mL at 05/14/21 0653   ondansetron (ZOFRAN) injection 4 mg, 4 mg, Intravenous, Q6H PRN, Bowser, Grace E, NP   pantoprazole (PROTONIX) injection 40 mg, 40 mg, Intravenous, QHS, Bowser, Grace E, NP, 40 mg at 05/13/21 2134   polyethylene glycol (MIRALAX / GLYCOLAX) packet 17 g, 17 g, Per Tube, Daily PRN, Bowser, Grace E, NP   thiamine (B-1) injection 100 mg, 100 mg, Intravenous, Daily, Bowser, Grace E, NP, 100 mg at 05/14/21 0927   [START ON 05/15/2021] vancomycin (VANCOREADY) IVPB 1500 mg/300 mL, 1,500 mg, Intravenous, Q48H, Barr, Grace E, RPH  Exam: Current vital signs: BP (!) 147/58   Pulse 78   Temp (!) 101.5 F (38.6 C) (Bladder)   Resp (!) 21   Ht 5' 4" (1.626 m)   Wt 111.7 kg   SpO2 98%   BMI 42.27 kg/m  Vital signs in last 24  hours: Temp:  [98 F (36.7 C)-101.8 F (38.8 C)] 101.5 F (38.6 C) (11/01 0742) Pulse Rate:  [37-95] 78 (11/01 0900) Resp:  [9-35] 21 (11/01 0900) BP: (102-183)/(58-137) 147/58 (11/01 0900) SpO2:  [94 %-100 %] 98 % (11/01 0900) FiO2 (%):  [30 %-100 %] 30 % (11/01 0838) Weight:  [111.7 kg-120.2 kg] 111.7 kg (11/01 0324)  GENERAL: Critically ill appearing female. Intubated, not on sedation in the ICU, in no acute distress Psych: Unable to assess due to patient's condition Head: Normocephalic and atraumatic, without obvious abnormality EENT: Scleral edema present L > R, oral ETT secured, moderate amount of thin, clear oral secretions noted. LUNGS: Respirations assisted via mechanical ventilation with spontaneous respirations present over set ventilator rate CV: Regular rate and rhythm on telemetry ABDOMEN: Soft, rounded, non-distended Extremities: warm, well perfused, without obvious deformity  NEURO:  Mental Status: Intubated, not on sedation in the   ICU.  She does not open eyes, does not follow commands.  Cranial Nerves:  II: PERRL 3 mm/brisk.   III, IV, VI: Initially, patient has a downward gaze but VOR intact. Following VOR assessment, gaze is midline and dysconjugate but no longer downward. V: Does not blink to threat throughout.  VII: Face appears symmetric resting with limited visualization 2/2 oral ETT and securement.  VIII: Unable to assess due to patient's condition IX, X: Cough reflex intact, unable to assess gag due to trouble advancing yaunker adequately. XI: Head is grossly midline XII: Does not protrude tongue to command Motor: Minimal intermittent and sporadic movement of bilateral thumbs.  Bilateral upper extremities are without movement or response to noxious stimuli.  Bilateral lower extremities have triple flexion response to noxious stimuli.  Tone is increased in bilateral lower extremities.  Sensation: No movement in bilateral upper extremities with application  of noxious stimuli, triple flexion without asymmetry of BLE with application of noxious stimuli.   Coordination: Unable to assess due to patient's condition DTRs: 2+ and symmetric patellae, biceps, and brachioradialis.  Plantars: Toes upgoing bilaterally Gait: Deferred  Labs I have reviewed labs in epic and the results pertinent to this consultation are: CBC    Component Value Date/Time   WBC 6.1 05/13/2021 1340   RBC 4.51 05/13/2021 1340   HGB 12.9 05/13/2021 1540   HGB 11.7 09/12/2020 1152   HCT 38.0 05/13/2021 1540   HCT 36.9 09/12/2020 1152   PLT 301 05/13/2021 1340   PLT 298 09/12/2020 1152   MCV 94.7 05/13/2021 1340   MCV 91 09/12/2020 1152   MCH 29.3 05/13/2021 1340   MCHC 30.9 05/13/2021 1340   RDW 12.0 05/13/2021 1340   RDW 11.8 09/12/2020 1152   LYMPHSABS 0.9 05/13/2021 1340   MONOABS 0.6 05/13/2021 1340   EOSABS 0.1 05/13/2021 1340   BASOSABS 0.0 05/13/2021 1340  CMP     Component Value Date/Time   NA 134 (L) 05/14/2021 0310   NA 144 09/27/2020 1036   K 4.8 05/14/2021 0310   CL 98 05/14/2021 0310   CO2 24 05/14/2021 0310   GLUCOSE 95 05/14/2021 0310   BUN 40 (H) 05/14/2021 0310   BUN 31 (H) 09/27/2020 1036   CREATININE 2.46 (H) 05/14/2021 0310   CALCIUM 8.5 (L) 05/14/2021 0310   PROT 6.6 05/13/2021 1340   PROT 6.7 09/12/2020 1152   ALBUMIN 3.5 05/13/2021 1340   ALBUMIN 4.3 09/12/2020 1152   AST 86 (H) 05/13/2021 1340   ALT 28 05/13/2021 1340   ALKPHOS 39 05/13/2021 1340   BILITOT 0.8 05/13/2021 1340   BILITOT 0.3 09/12/2020 1152   GFRNONAA 21 (L) 05/14/2021 0310   GFRAA 25 (L) 07/26/2020 0931  Lipid Panel     Component Value Date/Time   CHOL 137 09/12/2020 1320   TRIG 148 05/14/2021 0310   HDL 44 09/12/2020 1320   CHOLHDL 3.1 09/12/2020 1320   CHOLHDL 2.7 08/23/2010 2238   VLDL 12 08/23/2010 2238   LDLCALC 70 09/12/2020 1320  Drugs of Abuse     Component Value Date/Time   LABOPIA NONE DETECTED 05/13/2021 1356   COCAINSCRNUR NONE DETECTED  05/13/2021 1356   LABBENZ NONE DETECTED 05/13/2021 1356   AMPHETMU NONE DETECTED 05/13/2021 1356   THCU NONE DETECTED 05/13/2021 1356   LABBARB NONE DETECTED 05/13/2021 1356  Urinalysis    Component Value Date/Time   COLORURINE YELLOW 05/13/2021 1345   APPEARANCEUR HAZY (A) 05/13/2021 1345   LABSPEC 1.014 05/13/2021 1345     PHURINE 5.0 05/13/2021 1345   GLUCOSEU NEGATIVE 05/13/2021 1345   HGBUR NEGATIVE 05/13/2021 1345   BILIRUBINUR NEGATIVE 05/13/2021 1345   BILIRUBINUR Negative 09/12/2020 1648   KETONESUR 5 (A) 05/13/2021 1345   PROTEINUR NEGATIVE 05/13/2021 1345   UROBILINOGEN 0.2 09/12/2020 1648   UROBILINOGEN 0.2 08/23/2010 1355   NITRITE NEGATIVE 05/13/2021 1345   LEUKOCYTESUR NEGATIVE 05/13/2021 1345   Results for orders placed or performed during the hospital encounter of 05/13/21  Blood Culture (routine x 2)     Status: None (Preliminary result)   Collection Time: 05/13/21  1:43 PM   Specimen: BLOOD  Result Value Ref Range Status   Specimen Description BLOOD SITE NOT SPECIFIED  Final   Special Requests   Final    BOTTLES DRAWN AEROBIC AND ANAEROBIC Blood Culture results may not be optimal due to an inadequate volume of blood received in culture bottles   Culture   Final    NO GROWTH < 24 HOURS Performed at [facility], [address] [place], [place] [address]    Report Status PENDING  Incomplete  Blood Culture (routine x 2)     Status: None (Preliminary result)   Collection Time: 05/13/21  9:19 PM   Specimen: BLOOD RIGHT HAND  Result Value Ref Range Status   Specimen Description BLOOD RIGHT HAND  Final   Special Requests   Final    BOTTLES DRAWN AEROBIC AND ANAEROBIC Blood Culture results may not be optimal due to an inadequate volume of blood received in culture bottles   Culture   Final    NO GROWTH < 12 HOURS Performed at [facility], [address] [place], [place] [address]    Report Status PENDING  Incomplete  MRSA Next Gen by PCR, Nasal      Status: None   Collection Time: 05/13/21  9:44 PM   Specimen: Nasal Mucosa; Nasal Swab  Result Value Ref Range Status   MRSA by PCR Next Gen NOT DETECTED NOT DETECTED Final    Comment: (NOTE) The GeneXpert MRSA Assay (FDA approved for NASAL specimens only), is one component of a comprehensive MRSA colonization surveillance program. It is not intended to diagnose MRSA infection nor to guide or monitor treatment for MRSA infections. Test performance is not FDA approved in patients less than 2 years old. Performed at [facility], [address] [place],  [address]   Resp Panel by RT-PCR (Flu A&B, Covid) Nasopharyngeal Swab     Status: None   Collection Time: 05/14/21  4:38 AM   Specimen: Nasopharyngeal Swab; Nasopharyngeal(NP) swabs in vial transport medium  Result Value Ref Range Status   SARS Coronavirus 2 by RT PCR NEGATIVE NEGATIVE Final    Comment: (NOTE) SARS-CoV-2 target nucleic acids are NOT DETECTED.  The SARS-CoV-2 RNA is generally detectable in upper respiratory specimens during the acute phase of infection. The lowest concentration of SARS-CoV-2 viral copies this assay can detect is 138 copies/mL. A negative result does not preclude SARS-Cov-2 infection and should not be used as the sole basis for treatment or other patient management decisions. A negative result may occur with  improper specimen collection/handling, submission of specimen other than nasopharyngeal swab, presence of viral mutation(s) within the areas targeted by this assay, and inadequate number of viral copies(<138 copies/mL). A negative result must be combined with clinical observations, patient history, and epidemiological information. The expected result is Negative.  Fact Sheet for Patients:  https://www.fda.gov/media/152166/download  Fact Sheet for   Healthcare Providers:  IncredibleEmployment.be  This test is no t yet approved or cleared by the [place] and  has been authorized for detection and/or diagnosis of SARS-CoV-2 by FDA under an Emergency Use Authorization (EUA). This EUA will remain  in effect (meaning this test can be used) for the duration of the COVID-19 declaration under Section 564(b)(1) of the Act, 21 U.S.C.section 360bbb-3(b)(1), unless the authorization is terminated  or revoked sooner.       Influenza A by PCR NEGATIVE NEGATIVE Final   Influenza B by PCR NEGATIVE NEGATIVE Final    Comment: (NOTE) The Xpert Xpress SARS-CoV-2/FLU/RSV plus assay is intended as an aid in the diagnosis of influenza from Nasopharyngeal swab specimens and should not be used as a sole basis for treatment. Nasal washings and aspirates are unacceptable for Xpert Xpress SARS-CoV-2/FLU/RSV testing.  Fact Sheet for Patients: EntrepreneurPulse.com.au  Fact Sheet for Healthcare Providers: IncredibleEmployment.be  This test is not yet approved or cleared by the [place] FDA and has been authorized for detection and/or diagnosis of SARS-CoV-2 by FDA under an Emergency Use Authorization (EUA). This EUA will remain in effect (meaning this test can be used) for the duration of the COVID-19 declaration under Section 564(b)(1) of the Act, 21 U.S.C. section 360bbb-3(b)(1), unless the authorization is terminated or revoked.  Performed at [facility], [place] [address] [place], [place] [address]    Influenza A by PCR NEGATIVE NEGATIVE   Influenza B by PCR NEGATIVE NEGATIVE   SARS Coronavirus 2 by RT PCR NEGATIVE NEGATIVE    Lab Results  Component Value Date   CKTOTAL 1,948 (H) 05/14/2021   CKMB (HH) 09/05/2010    6.9 CRITICAL VALUE NOTED.  VALUE IS CONSISTENT WITH PREVIOUSLY REPORTED AND CALLED VALUE.   TROPONINI 0.02        NO INDICATION OF MYOCARDIAL INJURY. 09/05/2010   Lab Results  Component Value Date   FIEPPIRJ18 8,416 (H) 05/13/2021  Lactic Acid, Venous    Component Value  Date/Time   LATICACIDVEN 1.1 05/13/2021 1345   Imaging I have reviewed the images obtained:  CT-scan of the brain 10/31: No acute intracranial abnormality seen.  Routine EEG 11/1: "This study is suggestive of severe diffuse encephalopathy, nonspecific etiology. No seizures or epileptiform discharges were seen throughout the recording."  DG Chest portable 1 view: Endotracheal tube tip overlies the midthoracic trachea. No evidence of acute cardiopulmonary disease.  Assessment: 70 y.o. female who presented to the ED 10/31 for evaluation of responsiveness with hypoglycemia (CBG in the 30's PTA) without improvement in mental status following correction of hypoglycemia. Patient with fevers overnight. Initial work up reveals CK elevation to 2,377, Creatinine of 2.26 with eGFR of 23 (consistent with baseline renal function), CTH negative, UA negative for infectious concern, UDS negative. Overnight, patient spiked fevers with a  24 hour temperature maximum of 101.77F.  - Examination reveals patient that has a triple flexion response to noxious stimuli in BLE without response to noxious stimuli in BUE. She initially has a downward gaze that is easily overcome with VOR intact. She does not open eyes or follow commands.  - Patient's acute encephalopathy differential diagnosis list is broad but includes toxic-metabolic / infectious encephalopathy, sequelae of prolonged hypoglycemia event, provoked seizure in the setting of hypoglycemia, versus CNS infection. Less likely overdose with negative UDS and without improvement with administration of Narcan. MRI brain pending for further evaluation. Blood cultures pending. CXR negative for acute cardiopulmonary process.  - CTH was  obtained without evidence of acute intracranial process. Routine EEG was obtained with evidence of severe diffuse encephalopathy of nonspecific etiology without seizures or epileptiform discharges.  - Patient has been started on empiric  antibiotics with acyclovir for meningitis coverage with febrile state without clear etiology. Patient is without meningitic signs on examination. Will need LP for further evaluation with CSF cell count with differential, gram stain, protein, glucose, and HSV PCR.   Impression: Acute encephalopathy; unresponsiveness-likely due to prolonged hypoglycemia but rule out infectious process given fever Hypoglycemia- resolving  Recommendations: - MRI brain without contrast - LP following MRI brain with cell count + differential, gram stain, protein, glucose, and HSV PCR.  - Continue empiric meningitis antibiotics including ampicillin and acyclovir pending CSF studies - Further recommendations pending completion of initial recs as above  Pt seen by NP/Neuro and later by MD. Note/plan to be edited by MD as needed.  Stevi Toberman, AGAC-NP [facility] Pager: [phone]  Attending Neurohospitalist Addendum Patient seen and examined with APP/Resident. Agree with the history and physical as documented above. Agree with the plan as documented, which I helped formulate. I have independently reviewed the chart, obtained history, review of systems and examined the patient.I have personally reviewed pertinent head/neck/spine imaging (CT/MRI). I discussed my plan with Dr. Alva on the unit Please feel free to call with any questions.  --  , MD Neurologist [facility] Pager: [phone]  CRITICAL CARE ATTESTATION Performed by:  , MD Total critical care time: 30 minutes Critical care time was exclusive of separately billable procedures and treating other patients and/or supervising APPs/Residents/Students Critical care was necessary to treat or prevent imminent or life-threatening deterioration due to toxic metabolic encephalopathy This patient is critically ill and at significant risk for neurological worsening and/or death and care requires constant  monitoring. Critical care was time spent personally by me on the following activities: development of treatment plan with patient and/or surrogate as well as nursing, discussions with consultants, evaluation of patient's response to treatment, examination of patient, obtaining history from patient or surrogate, ordering and performing treatments and interventions, ordering and review of laboratory studies, ordering and review of radiographic studies, pulse oximetry, re-evaluation of patient's condition, participation in multidisciplinary rounds and medical decision making of high complexity in the care of this patient.   

## 2021-05-14 NOTE — Procedures (Signed)
Technically successful L3/L4 lumbar puncture yielding 10 ml of clear CSF for lab studies. Please see full dictation under imaging tab in Epic.   Soyla Dryer,  [phone] 05/14/2021, 3:58 PM

## 2021-05-14 NOTE — Progress Notes (Signed)
[facility] Progress Note Patient Name: Kimberly Gross DOB: 1950/09/18 MRN: [id number]   Date of Service  05/14/2021  HPI/Events of Note  LUE quite edematous compared to contralateral side  eICU Interventions  Venous doppler ordered     Intervention Category Intermediate Interventions: Other:  Tilden Dome 05/14/2021, 11:45 PM

## 2021-05-15 ENCOUNTER — Inpatient Hospital Stay (HOSPITAL_COMMUNITY): Payer: Medicare (Managed Care)

## 2021-05-15 DIAGNOSIS — R609 Edema, unspecified: Secondary | ICD-10-CM

## 2021-05-15 DIAGNOSIS — G9340 Encephalopathy, unspecified: Secondary | ICD-10-CM | POA: Diagnosis not present

## 2021-05-15 DIAGNOSIS — G40901 Epilepsy, unspecified, not intractable, with status epilepticus: Secondary | ICD-10-CM

## 2021-05-15 DIAGNOSIS — J9601 Acute respiratory failure with hypoxia: Secondary | ICD-10-CM | POA: Diagnosis not present

## 2021-05-15 LAB — GLUCOSE, CAPILLARY
Glucose-Capillary: 135 mg/dL — ABNORMAL HIGH (ref 70–99)
Glucose-Capillary: 141 mg/dL — ABNORMAL HIGH (ref 70–99)
Glucose-Capillary: 142 mg/dL — ABNORMAL HIGH (ref 70–99)
Glucose-Capillary: 153 mg/dL — ABNORMAL HIGH (ref 70–99)
Glucose-Capillary: 159 mg/dL — ABNORMAL HIGH (ref 70–99)
Glucose-Capillary: 163 mg/dL — ABNORMAL HIGH (ref 70–99)

## 2021-05-15 LAB — BASIC METABOLIC PANEL
Anion gap: 9 (ref 5–15)
BUN: 45 mg/dL — ABNORMAL HIGH (ref 8–23)
CO2: 26 mmol/L (ref 22–32)
Calcium: 8.3 mg/dL — ABNORMAL LOW (ref 8.9–10.3)
Chloride: 100 mmol/L (ref 98–111)
Creatinine, Ser: 2.61 mg/dL — ABNORMAL HIGH (ref 0.44–1.00)
GFR, Estimated: 19 mL/min — ABNORMAL LOW (ref >60)
Glucose, Bld: 169 mg/dL — ABNORMAL HIGH (ref 70–99)
Potassium: 5.4 mmol/L — ABNORMAL HIGH (ref 3.5–5.1)
Sodium: 135 mmol/L (ref 135–145)

## 2021-05-15 LAB — CBC
HCT: 35.8 % — ABNORMAL LOW (ref 36.0–46.0)
Hemoglobin: 11.4 g/dL — ABNORMAL LOW (ref 12.0–15.0)
MCH: 29.6 pg (ref 26.0–34.0)
MCHC: 31.8 g/dL (ref 30.0–36.0)
MCV: 93 fL (ref 80.0–100.0)
Platelets: 232 10*3/uL (ref 150–400)
RBC: 3.85 MIL/uL — ABNORMAL LOW (ref 3.87–5.11)
RDW: 12.5 % (ref 11.5–15.5)
WBC: 10.3 10*3/uL (ref 4.0–10.5)
nRBC: 0 % (ref 0.0–0.2)

## 2021-05-15 LAB — MAGNESIUM: Magnesium: 2.1 mg/dL (ref 1.7–2.4)

## 2021-05-15 LAB — VDRL, CSF: VDRL Quant, CSF: NONREACTIVE

## 2021-05-15 LAB — PHOSPHORUS: Phosphorus: 4 mg/dL (ref 2.5–4.6)

## 2021-05-15 IMAGING — DX DG CHEST 1V PORT
1 series · 1 of 1 positions shown · non-contrast
Comparison: Previous studies including the examination of
[DATE]

CLINICAL DATA: Difficulty breathing

EXAM:
PORTABLE CHEST 1 VIEW

[chest ap]
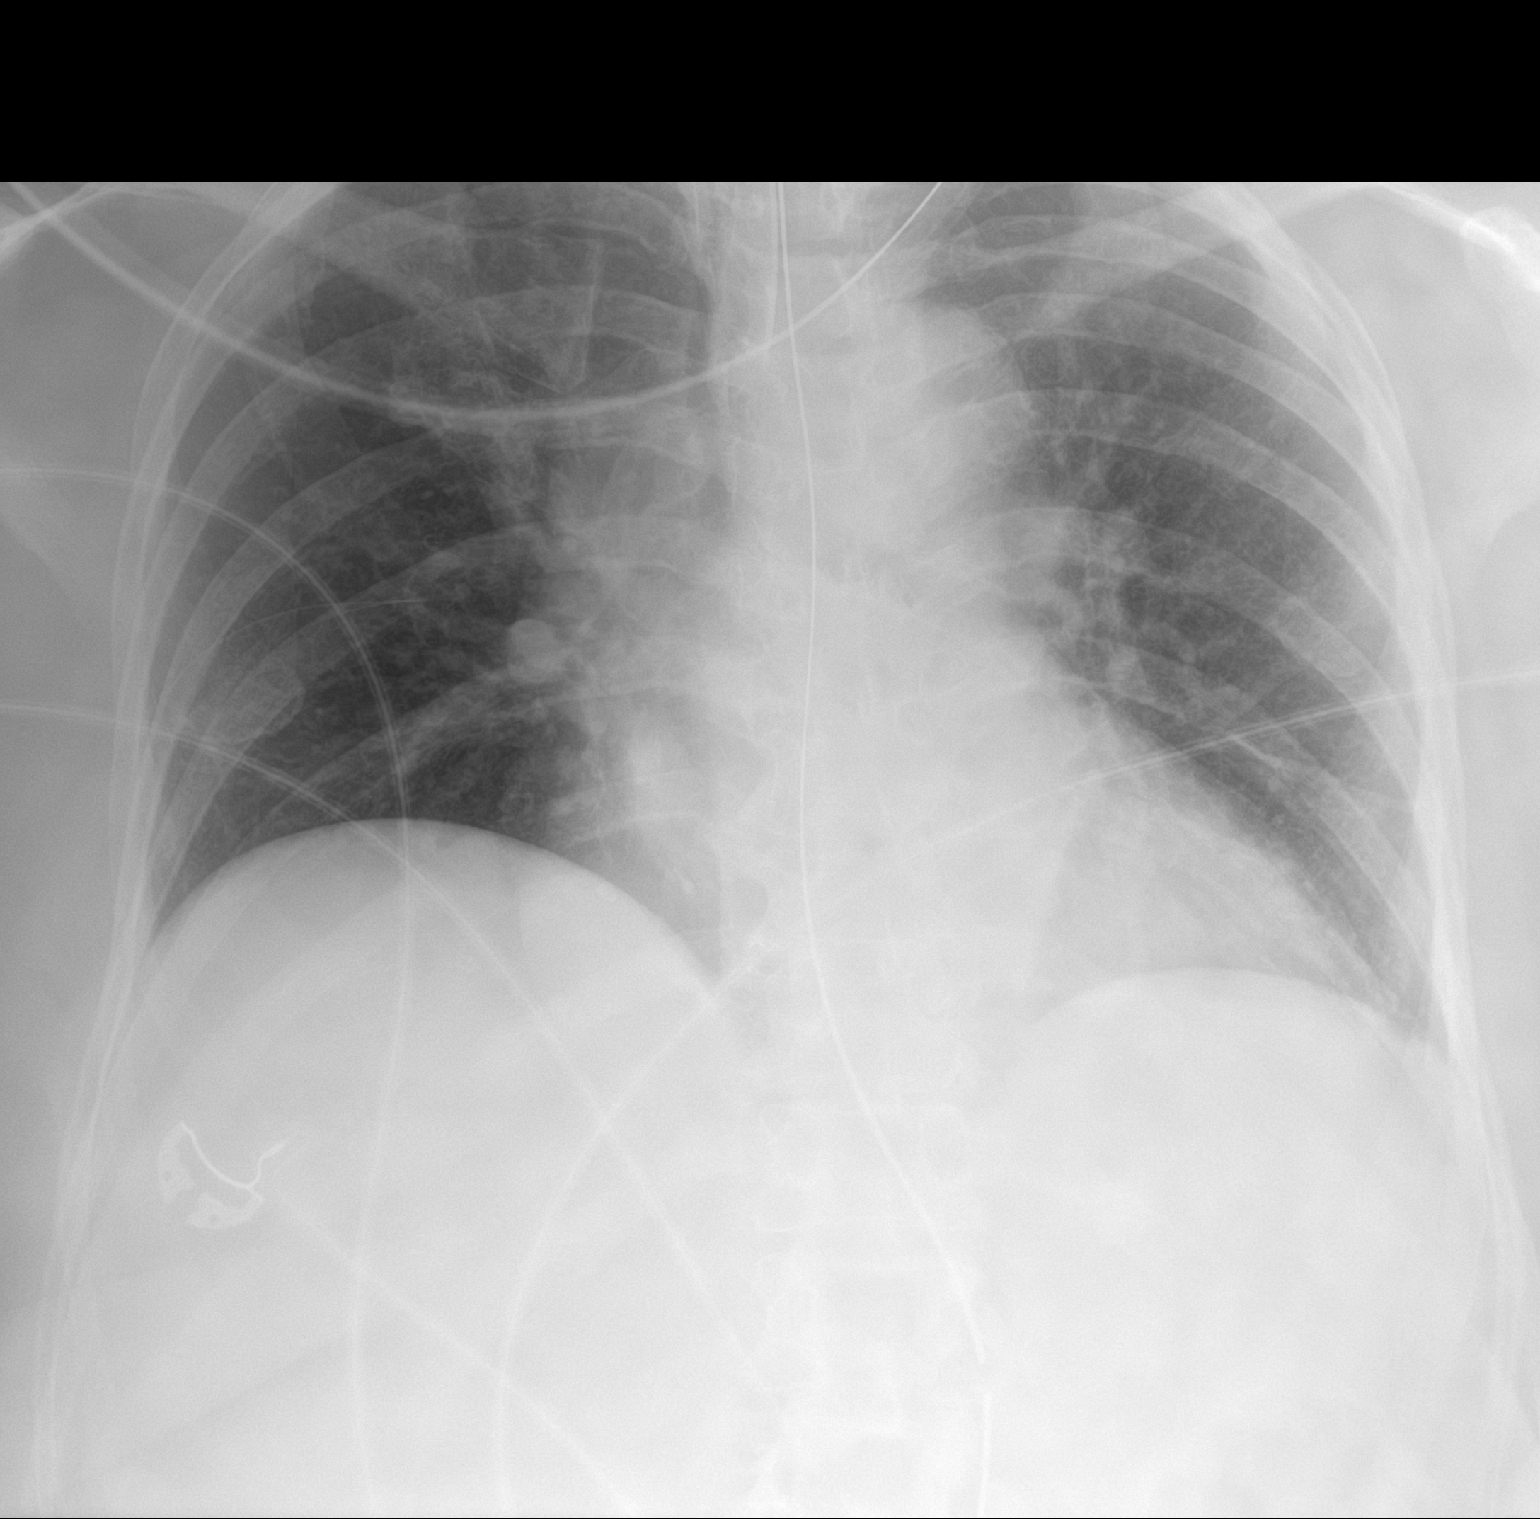

[1 of 1 positions shown; findings below may reference images not displayed]

FINDINGS: Transverse diameter of heart is increased. There are no signs of
pulmonary edema. There are no new focal pulmonary infiltrates. Small
linear density in the left lower lung fields may suggest minimal
scarring or subsegmental atelectasis. There is no pleural effusion
or pneumothorax. Tip of endotracheal tube is 3.4 cm above the
carina. Enteric tube is noted in place.
IMPRESSION: There are no new infiltrates or signs of pulmonary edema.

## 2021-05-15 MED ORDER — SODIUM CHLORIDE 0.9 % IV SOLN
3.0000 g | Freq: Two times a day (BID) | INTRAVENOUS | Status: DC
  Administered 2021-05-15 – 2021-05-16 (×2): 3 g via INTRAVENOUS
  Filled 2021-05-15 (×2): qty 8

## 2021-05-15 MED ORDER — LEVETIRACETAM IN NACL 500 MG/100ML IV SOLN
500.0000 mg | Freq: Two times a day (BID) | INTRAVENOUS | Status: DC
  Administered 2021-05-16 – 2021-05-20 (×9): 500 mg via INTRAVENOUS
  Filled 2021-05-15 (×10): qty 100

## 2021-05-15 MED ORDER — CHLORHEXIDINE GLUCONATE 0.12 % MT SOLN
OROMUCOSAL | Status: AC
  Filled 2021-05-15: qty 15

## 2021-05-15 MED ORDER — LACTATED RINGERS IV SOLN: INTRAVENOUS | Status: DC

## 2021-05-15 MED ORDER — LEVETIRACETAM IN NACL 1500 MG/100ML IV SOLN
1500.0000 mg | Freq: Once | INTRAVENOUS | Status: AC
  Administered 2021-05-15: 1500 mg via INTRAVENOUS
  Filled 2021-05-15: qty 100

## 2021-05-15 MED ORDER — INSULIN ASPART 100 UNIT/ML IJ SOLN
0.0000 [IU] | INTRAMUSCULAR | Status: DC
Start: 2021-05-15 — End: 2021-05-24
  Administered 2021-05-15 – 2021-05-21 (×18): 1 [IU] via SUBCUTANEOUS
  Administered 2021-05-21: 2 [IU] via SUBCUTANEOUS
  Administered 2021-05-21 – 2021-05-22 (×4): 1 [IU] via SUBCUTANEOUS
  Administered 2021-05-22: 2 [IU] via SUBCUTANEOUS
  Administered 2021-05-22 – 2021-05-24 (×12): 1 [IU] via SUBCUTANEOUS

## 2021-05-15 MED ORDER — CARVEDILOL 6.25 MG PO TABS
6.2500 mg | ORAL_TABLET | Freq: Two times a day (BID) | ORAL | Status: DC
  Administered 2021-05-15 – 2021-05-18 (×5): 6.25 mg
  Filled 2021-05-15 (×5): qty 1

## 2021-05-15 NOTE — Progress Notes (Signed)
Pharmacy Antibiotic Note  Kimberly Gross is a 70 y.o. female admitted on 05/13/2021 with pneumonia.  Pharmacy has been consulted for Unasyn dosing. CSF was unremarkable and had no organisms. Changed treatment from meningitis treatment to aspiration pneumonia treatment.  Plan: Discontinue vancomycin, acyclovir, ampicillin, and ceftriaxone. Start Unasyn 3 g q12h Monitor renal function and clinical status for dose adjustments  Height: 5\' 4"  (162.6 cm) Weight: 111.7 kg (246 lb 4.1 oz) IBW/kg (Calculated) : 54.7  Temp (24hrs), Avg:100.4 F (38 C), Min:99.4 F (37.4 C), Max:102 F (38.9 C)  Recent Labs  Lab 05/13/21 1340 05/13/21 1342 05/13/21 1345 05/14/21 0310 05/15/21 0247  WBC 6.1  --   --   --  10.3  CREATININE 2.26* 2.30*  --  2.46* 2.61*  LATICACIDVEN  --   --  1.1  --   --     Estimated Creatinine Clearance: 24.5 mL/min (A) (by C-G formula based on SCr of 2.61 mg/dL (H)).    Allergies  Allergen Reactions   Chlorpromazine Rash    Antimicrobials this admission: Vancomycin 10/31 >> 11/2 Ceftriaxone 10/31 >> 11/2 Ampicillin 10/31 >> 11/2 Acyclovir 10/31 >> 11/2 Unasyn 11/2 >>   Dose adjustments this admission: None.  Microbiology results: 10/31 BCx: NGTD 10/31 UCx: needs recollection, multiple species present  11/1 CSF: no organisms seen  10/31 MRSA PCR: not detected   Thank you for allowing pharmacy to be a part of this patient's care.  Varney Daily, PharmD PGY1 Pharmacy Resident  Please check AMION for all [facility] pharmacy phone numbers After 10:00 PM call main pharmacy [phone]

## 2021-05-15 NOTE — Progress Notes (Signed)
EEG complete - results pending 

## 2021-05-15 NOTE — Progress Notes (Signed)
vLTM started  all imp below 10kohm.  [facility] to monitoring   Patient event button tested

## 2021-05-15 NOTE — Progress Notes (Signed)
Patient's repeat routine EEG shows seizures and more than 50% of the recording. Would recommend loading with Keppra Repeat LTM EEG Will follow  -- Amie Portland, MD Neurologist [facility] Pager: [phone]

## 2021-05-15 NOTE — Progress Notes (Addendum)
Neurology Progress Note  S: Patient is unable to participate in ROS due to unresponsiveness. No recent issues per RN. Long longer hypoglycemic. Still running fevers.   O: Current vital signs: BP (!) 130/55   Pulse 87   Temp (!) 101.5 F (38.6 C) (Oral)   Resp 20   Ht $R'5\' 4"'Cf$  (1.626 m)   Wt 111.7 kg   SpO2 99%   BMI 42.27 kg/m  Vital signs in last 24 hours: Temp:  [99.4 F (37.4 C)-102 F (38.9 C)] 101.5 F (38.6 C) (11/02 0742) Pulse Rate:  [78-88] 87 (11/02 0700) Resp:  [14-23] 20 (11/02 0700) BP: (127-160)/(55-108) 130/55 (11/02 0700) SpO2:  [94 %-100 %] 99 % (11/02 0700) FiO2 (%):  [30 %] 30 % (11/02 0400) Weight:  [111.7 kg] 111.7 kg (11/02 0500)  GENERAL: Acutely ill appearing, obese female lying in ICU bed.  HEENT: Normocephalic and atraumatic. No nuchal rigidity on head turn.  LUNGS: Intubated on ventilator.  CV: RRR on tele.  Ext: warm.  NEURO:  GCS: Best motor:  1   Best verbal:  1    Best eye: 1   Total 3 Mental Status: Unresponsive. Follows no commands.  Speech/Language: No sounds. ETT in place.   Cranial Nerves:  II: PERRL. She blinks to threat.  III, IV, VI: Eyes are dyscongugate OS fixed to the left. Right OD down.  V, VII: No response to brow pressure. + corneal reflex.  VIII: No response to loud voice or clapping.  IX, X: + cough. Head is grossly midline.  XII: intubated.  Motor/Sensation:  She grimaces to Qtip to nares. Moves extremities only slightly to noxious stimuli. Does not localize.  Tone: is normal and bulk is increased.  Coordination: No tremors, shaking, jerking, or clonus noted.   DTRs:  BUEs-1+    BLEs-0 Gait- deferred. Patient on bedrest.   Medications  Current Facility-Administered Medications:    acetaminophen (TYLENOL) tablet 650 mg, 650 mg, Per Tube, Q4H PRN, Bowser, Laurel Dimmer, NP, 650 mg at 05/15/21 0749   acyclovir (ZOVIRAX) 810 mg in dextrose 5 % 150 mL IVPB, 10 mg/kg (Adjusted), Intravenous, Q24H, Henri Medal, RPH, Paused  at 05/14/21 1805   ampicillin (OMNIPEN) 2 g in sodium chloride 0.9 % 100 mL IVPB, 2 g, Intravenous, Q8H, Bowser, Laurel Dimmer, NP, Stopped at 05/15/21 0211   cefTRIAXone (ROCEPHIN) 2 g in sodium chloride 0.9 % 100 mL IVPB, 2 g, Intravenous, Q12H, Bowser, Laurel Dimmer, NP, Paused at 05/14/21 2306   chlorhexidine gluconate (MEDLINE KIT) (PERIDEX) 0.12 % solution 15 mL, 15 mL, Mouth Rinse, BID, Bowser, Laurel Dimmer, NP, 15 mL at 05/14/21 1936   Chlorhexidine Gluconate Cloth 2 % PADS 6 each, 6 each, Topical, Daily, Jacky Kindle, MD, 6 each at 05/14/21 1935   docusate (COLACE) 50 MG/5ML liquid 100 mg, 100 mg, Per Tube, BID PRN, Henri Medal, RPH   feeding supplement (PROSource TF) liquid 45 mL, 45 mL, Per Tube, TID, Rigoberto Noel, MD, 45 mL at 05/14/21 2236   feeding supplement (VITAL 1.5 CAL) liquid 1,000 mL, 1,000 mL, Per Tube, Continuous, Rigoberto Noel, MD, Held at 05/14/21 1600   fentaNYL (SUBLIMAZE) injection 25 mcg, 25 mcg, Intravenous, Q15 min PRN, Bowser, Laurel Dimmer, NP   fentaNYL (SUBLIMAZE) injection 25-100 mcg, 25-100 mcg, Intravenous, Q30 min PRN, Pierce, Dwayne A, RPH   heparin injection 5,000 Units, 5,000 Units, Subcutaneous, Q8H, Bowser, Grace E, NP, 5,000 Units at 05/15/21 0639   insulin aspart (novoLOG) injection  1-3 Units, 1-3 Units, Subcutaneous, Q4H, Tilden Dome, MD, 2 Units at 05/15/21 0423   lactated ringers infusion, , Intravenous, Continuous, Bowser, Laurel Dimmer, NP   lactated ringers infusion, , Intravenous, Continuous, Bowser, Laurel Dimmer, NP, Stopped at 05/14/21 1351   lactated ringers infusion, , Intravenous, Continuous, Tilden Dome, MD, Stopped at 05/15/21 0430   MEDLINE mouth rinse, 15 mL, Mouth Rinse, 10 times per day, Bowser, Shirlee Limerick E, NP, 15 mL at 05/15/21 0649   ondansetron (ZOFRAN) injection 4 mg, 4 mg, Intravenous, Q6H PRN, Bowser, Laurel Dimmer, NP   pantoprazole (PROTONIX) injection 40 mg, 40 mg, Intravenous, QHS, Bowser, Grace E, NP, 40 mg at 05/14/21 2226   polyethylene glycol  (MIRALAX / GLYCOLAX) packet 17 g, 17 g, Per Tube, Daily PRN, Bowser, Laurel Dimmer, NP   thiamine (B-1) injection 100 mg, 100 mg, Intravenous, Daily, Bowser, Grace E, NP, 100 mg at 05/14/21 0927   vancomycin (VANCOREADY) IVPB 1500 mg/300 mL, 1,500 mg, Intravenous, Q48H, Henri Medal, RPH  Pertinent Labs Glucose 142.   LP: Results for BROOKIE, WAYMENT (MRN [id number]) as of 05/15/2021 14:15  Ref. Range 05/14/2021 16:12  Appearance, CSF Latest Ref Range: CLEAR  CLEAR (A)  Glucose, CSF Latest Ref Range: 40 - 70 mg/dL 80 (H)  RBC Count, CSF Latest Ref Range: 0 /cu mm 4 (H)  WBC, CSF Latest Ref Range: 0 - 5 /cu mm 2  Other Cells, CSF Unknown TOO FEW TO COUNT, SMEAR AVAILABLE FOR REVIEW  Color, CSF Latest Ref Range: COLORLESS  COLORLESS  Supernatant Unknown NOT INDICATED  Total  Protein, CSF Latest Ref Range: 15 - 45 mg/dL 33  Tube # Unknown 1   Imaging-MRIb on 05/14/21.  No evidence of recent infarction, hemorrhage, or mass. Chronic microvascular ischemic changes.  EEG  evidence of severe diffuse encephalopathy of nonspecific etiology without seizures or epileptiform discharges.   Assessment: 70 y.o. female who presented to the ED 10/31 for evaluation of unresponsiveness with hypoglycemia (CBG in the 30's PTA) without improvement in mental status following correction of hypoglycemia. Patient still with fevers. Due to continued fevers and unknown etiology, patient underwent LP under flouro with preliminary results unimpressive. HSV is still pending. Acyclovir on board. MRI brain negative for acute findings. It is still possible that her unresponsive state is due to prolonged hypoglycemia and or toxic/metabolic/infectious encephalopathy and may take time to resolve. Her r/p CXR this am shows no PNA.   Impression: -Acute encephalopathy; unresponsiveness-likely due to prolonged hypoglycemia but rule out infectious process given fever  Recommendations/Plan:  -r/p rEEG and add cEEG if needed.   -Continue to treat metabolic/infectious/toxic issues as you are doing.  -Continue Acyclovir until HSV results as negative.  -Appreciate critical care.  -Neurology will continue to follow.    Pt seen by Clance Boll, MSN, APN-BC/Nurse Practitioner/Neuro and later by MD. Note and plan to be edited as needed by MD.  Pager: [phone]   Attending Neurohospitalist Addendum Patient seen and examined with APP/Resident. Agree with the history and physical as documented above. Agree with the plan as documented, which I helped formulate. I have independently reviewed the chart, obtained history, review of systems and examined the patient.I have personally reviewed pertinent head/neck/spine imaging (CT/MRI).  LP performed by radiology under fluoroscopy.  Preliminary results negative for meningoencephalitis.  Continue acyclovir till HSV PCR results come back. Routine EEG. If shows abnormalities, might consider long-term EEG.   Please feel free to call with any questions.  -- Amie Portland, MD Neurologist  [facility] Pager: [phone]   CRITICAL CARE ATTESTATION Performed by: Amie Portland, MD Total critical care time: 30 minutes Critical care time was exclusive of separately billable procedures and treating other patients and/or supervising APPs/Residents/Students Critical care was necessary to treat or prevent imminent or life-threatening deterioration due to toxic metabolic encephalopathy This patient is critically ill and at significant risk for neurological worsening and/or death and care requires constant monitoring. Critical care was time spent personally by me on the following activities: development of treatment plan with patient and/or surrogate as well as nursing, discussions with consultants, evaluation of patient's response to treatment, examination of patient, obtaining history from patient or surrogate, ordering and performing treatments and interventions,  ordering and review of laboratory studies, ordering and review of radiographic studies, pulse oximetry, re-evaluation of patient's condition, participation in multidisciplinary rounds and medical decision making of high complexity in the care of this patient.

## 2021-05-15 NOTE — Progress Notes (Signed)
NAME:  Kimberly Gross, MRN:  [id number], DOB:  1951/03/04, LOS: 2 ADMISSION DATE:  05/13/2021, CONSULTATION DATE:  05/13/21 REFERRING MD:  Ronnald Nian - EM, CHIEF COMPLAINT:  AMS    History of Present Illness:  70 yo F PMH CKD IV, DM, bipolar disorder, HTN, Osa on CPAP who was found obtunded in bed by family member 10/31 afternoon found to be severely hypoglycemic which improved with glucagon but no improvement in mental status. Intubated for GCS 3. CT head unremarkable, CBC and CMP also unremarkable.   Pertinent  Medical History  CKD IV Bipolar Disorder DM2  HLD Anxiety HTN OSA on CPAP  Significant Hospital Events: Including procedures, antibiotic start and stop dates in addition to other pertinent events   10/31 Obtunded and hypoglycemic for EMS, mentation did not improve with glucagon. Intubated in ED, CT H non acute. Admitting to PCCM  11/1 LP performed by IR, CSF clear with no organisms on gram stain  Interim History / Subjective:   Critically ill, intubated Remains obtunded, GCS 3 Remains febrile T-max 102 F Increasing secretions   Objective   Blood pressure (!) 130/55, pulse 87, temperature 98.7 F (37.1 C), temperature source Axillary, resp. rate 20, height 5\' 4"  (1.626 m), weight 111.7 kg, SpO2 99 %.    Vent Mode: PRVC FiO2 (%):  [30 %] 30 % Set Rate:  [18 bmp] 18 bmp Vt Set:  [440 mL] 440 mL PEEP:  [5 cmH20] 5 cmH20 Plateau Pressure:  [15 cmH20-19 cmH20] 15 cmH20   Intake/Output Summary (Last 24 hours) at 05/15/2021 0749 Last data filed at 05/15/2021 0600 Gross per 24 hour  Intake 2021.62 ml  Output 625 ml  Net 1396.62 ml    Filed Weights   05/13/21 1906 05/14/21 0324 05/15/21 0500  Weight: 111.7 kg 111.7 kg 111.7 kg    Examination: General: Chronically and critically ill appearing obese older adult F intubated, obtunded, NAD  Eyes: Anicteric sclera, PERRL. Disconjugate gaze with fixed leftward gaze on the left Neck: supple, no nuchal rigidity HENT:  NCAT redundant neck tissue. ETT secure.  Lungs: CTAb. Symmetrical chest expansion, mechanically ventilated  Cardiovascular: RRR, Q6P6, 2/6 systolic murmur at the LUSB Abdomen: Obese soft ndnt Extremities: No acute joint deformity. No cyanosis or clubbing  Neuro: Unable to follow 1-step commands. Withdraws to pain. Downward going toes with Babinski  Labs show mild hyperkalemia, slight increase in creatinine, mild anemia  CSF with clear fluid, 2 WBC, normal protein, no organisms seen on gram stain  MRI with no acute abnormalities  CXR repeated, no new infiltrates or signs of pulmonary edema   Resolved Hospital Problem list     Assessment & Plan:   Acute metabolic encephalopathy Prolonged hypoglycemia vs CNS infection? She remains febrile but CSF is clean.  Will narrow antibiotics and discontinue antivirals (to avoid volume overload).  CXR clear but will cover for aspiration pneumonia given increasing secretions. -neurology consulted -LTM EEG per neuro -narrow antibiotics to ampicillin-sulbactam -d/c acyclovir -f/u remaining CSF studies -minimize CNS depressing meds as able -thiamine  Acute respiratory failure with hypoxia -full MV support, wean as able -VAP, pulm hygiene -PAD for RASS goal  0 -- sedation currently off.   Left upper extremity edema - f/u venous doppler  CKD IV  Elevated CK  Elevated CK possibly from prolonged lie, low suspicion for seizure on EEG -trend Cr -Foley -strict I/O   DM2  Hypoglycemia - d/c 10 infusion - vsSSI -q4hr CBG   HTN HLD -restart carvedilol  at lower dose -hold other home meds   Best Practice (right click and "Reselect all SmartList Selections" daily)   Diet/type: tubefeeds and NPO, wean IV fluids with start of tube feeds DVT prophylaxis: prophylactic heparin  GI prophylaxis: PPI Lines: N/A Foley:  Yes, and it is still needed Code Status:  full code Last date of multidisciplinary goals of care discussion [pending.  Daughter updated at bedside 11/1]  Labs   CBC: Recent Labs  Lab 05/13/21 1340 05/13/21 1342 05/13/21 1343 05/13/21 1540 05/15/21 0247  WBC 6.1  --   --   --  10.3  NEUTROABS 4.5  --   --   --   --   HGB 13.2 14.3 14.3 12.9 11.4*  HCT 42.7 42.0 42.0 38.0 35.8*  MCV 94.7  --   --   --  93.0  PLT 301  --   --   --  232     Basic Metabolic Panel: Recent Labs  Lab 05/13/21 1340 05/13/21 1342 05/13/21 1343 05/13/21 1540 05/13/21 2119 05/14/21 0310 05/15/21 0247  NA 141 140 140 140  --  134* 135  K 4.6 4.6 4.6 4.8  --  4.8 5.4*  CL 103 103  --   --   --  98 100  CO2 27  --   --   --   --  24 26  GLUCOSE 70 72  --   --   --  95 169*  BUN 40* 48*  --   --   --  40* 45*  CREATININE 2.26* 2.30*  --   --   --  2.46* 2.61*  CALCIUM 9.4  --   --   --   --  8.5* 8.3*  MG  --   --   --   --  2.1 2.1 2.1  PHOS  --   --   --   --   --   --  4.0    GFR: Estimated Creatinine Clearance: 24.5 mL/min (A) (by C-G formula based on SCr of 2.61 mg/dL (H)). Recent Labs  Lab 05/13/21 1340 05/13/21 1345 05/15/21 0247  WBC 6.1  --  10.3  LATICACIDVEN  --  1.1  --      Liver Function Tests: Recent Labs  Lab 05/13/21 1340  AST 86*  ALT 28  ALKPHOS 39  BILITOT 0.8  PROT 6.6  ALBUMIN 3.5    No results for input(s): LIPASE, AMYLASE in the last 168 hours. Recent Labs  Lab 05/13/21 1349  AMMONIA 35     ABG    Component Value Date/Time   PHART 7.401 05/13/2021 1540   PCO2ART 53.0 (H) 05/13/2021 1540   PO2ART 520 (H) 05/13/2021 1540   HCO3 32.9 (H) 05/13/2021 1540   TCO2 34 (H) 05/13/2021 1540   O2SAT 100.0 05/13/2021 1540      Coagulation Profile: Recent Labs  Lab 05/13/21 1340  INR 1.0     Cardiac Enzymes: Recent Labs  Lab 05/13/21 1340 05/14/21 0310  CKTOTAL 2,377* 1,948*     HbA1C: Hgb A1c MFr Bld  Date/Time Value Ref Range Status  09/12/2020 11:52 AM 5.1 4.8 - 5.6 % Final    Comment:             Prediabetes: 5.7 - 6.4          Diabetes: >6.4           Glycemic control for adults with diabetes: <7.0   03/06/2020 12:12 PM  8.3 (H) 4.8 - 5.6 % Final    Comment:             Prediabetes: 5.7 - 6.4          Diabetes: >6.4          Glycemic control for adults with diabetes: <7.0     CBG: Recent Labs  Lab 05/14/21 1955 05/14/21 2225 05/14/21 2326 05/15/21 0332 05/15/21 0741  GLUCAP 204* 180* 194* 163* 142*     Critical care time:      Zola Button, MD PGY-2 [facility] 05/15/2021, 7:49 AM

## 2021-05-15 NOTE — Procedures (Signed)
Routine EEG Report  Kimberly Gross is a 70 y.o. female with a history of encephalopathy who is undergoing an EEG to evaluate for seizures.  Report: This EEG was acquired with electrodes placed according to the International 10-20 electrode system (including Fp1, Fp2, F3, F4, C3, C4, P3, P4, O1, O2, T3, T4, T5, T6, A1, A2, Fz, Cz, Pz). The following electrodes were missing or displaced: none.  The occipital dominant rhythm was 5-6 Hz. This activity is reactive to stimulation. Drowsiness was manifested by background fragmentation; deeper stages of sleep were not identified. There was no focal slowing. There were no interictal epileptiform discharges.   There were 8 electrographic seizures during this 42 minute recording that ranged in duration from 30 seconds to 4 minutes. Each seizure begins with very low amplitude fast activity in the left frontotemporal leads and spreading in <1 second to the right frontotemporal leads after which the activity builds up into spike wave activity at 1 Hz, generalized but best seen over the left temporal region, until it abates and is followed by brief relative suppression. Accompanying video for multiple events reviewed and patient appeared to be either symptomatic or was seen to tense his shoulders. No button pushes throughout recording.  Impression and clinical correlation: This EEG was obtained while awake and drowsy and is abnormal due to: - Frequent subclinical electrographic seizures likely originating in the left temporal region and frequently generalizing (seizure activity present in estimated >50% of recording). - Moderate diffuse slowing  D/w Dr. Rory Percy who will order LTM monitoring.  Su Monks, MD [facility] [phone]  If 7pm- 7am, please page neurology on call as listed in [place].

## 2021-05-15 NOTE — Progress Notes (Signed)
Left upper extremity venous duplex completed. Refer to "CV Proc" under chart review to view preliminary results.  05/15/2021 3:25 PM Kelby Aline., MHA, RVT, RDCS, RDMS

## 2021-05-16 DIAGNOSIS — J9601 Acute respiratory failure with hypoxia: Secondary | ICD-10-CM | POA: Diagnosis not present

## 2021-05-16 DIAGNOSIS — G9340 Encephalopathy, unspecified: Secondary | ICD-10-CM | POA: Diagnosis not present

## 2021-05-16 DIAGNOSIS — G9341 Metabolic encephalopathy: Secondary | ICD-10-CM | POA: Diagnosis not present

## 2021-05-16 LAB — BASIC METABOLIC PANEL
Anion gap: 11 (ref 5–15)
BUN: 47 mg/dL — ABNORMAL HIGH (ref 8–23)
CO2: 24 mmol/L (ref 22–32)
Calcium: 8.4 mg/dL — ABNORMAL LOW (ref 8.9–10.3)
Chloride: 103 mmol/L (ref 98–111)
Creatinine, Ser: 2.13 mg/dL — ABNORMAL HIGH (ref 0.44–1.00)
GFR, Estimated: 24 mL/min — ABNORMAL LOW (ref >60)
Glucose, Bld: 166 mg/dL — ABNORMAL HIGH (ref 70–99)
Potassium: 4.2 mmol/L (ref 3.5–5.1)
Sodium: 138 mmol/L (ref 135–145)

## 2021-05-16 LAB — TROPONIN I (HIGH SENSITIVITY)
Troponin I (High Sensitivity): 30 ng/L — ABNORMAL HIGH (ref <18)
Troponin I (High Sensitivity): 27 ng/L — ABNORMAL HIGH (ref <18)

## 2021-05-16 LAB — CBC
HCT: 32.8 % — ABNORMAL LOW (ref 36.0–46.0)
Hemoglobin: 10.3 g/dL — ABNORMAL LOW (ref 12.0–15.0)
MCH: 29.3 pg (ref 26.0–34.0)
MCHC: 31.4 g/dL (ref 30.0–36.0)
MCV: 93.4 fL (ref 80.0–100.0)
Platelets: 193 10*3/uL (ref 150–400)
RBC: 3.51 MIL/uL — ABNORMAL LOW (ref 3.87–5.11)
RDW: 12.3 % (ref 11.5–15.5)
WBC: 9.1 10*3/uL (ref 4.0–10.5)
nRBC: 0 % (ref 0.0–0.2)

## 2021-05-16 LAB — LIPID PANEL
Cholesterol: 139 mg/dL (ref 0–200)
HDL: 43 mg/dL (ref >40)
LDL Cholesterol: 67 mg/dL (ref 0–99)
Total CHOL/HDL Ratio: 3.2 RATIO
Triglycerides: 147 mg/dL (ref <150)
VLDL: 29 mg/dL (ref 0–40)

## 2021-05-16 LAB — CULTURE, RESPIRATORY W GRAM STAIN: Culture: NORMAL

## 2021-05-16 LAB — GLUCOSE, CAPILLARY
Glucose-Capillary: 122 mg/dL — ABNORMAL HIGH (ref 70–99)
Glucose-Capillary: 133 mg/dL — ABNORMAL HIGH (ref 70–99)
Glucose-Capillary: 144 mg/dL — ABNORMAL HIGH (ref 70–99)
Glucose-Capillary: 145 mg/dL — ABNORMAL HIGH (ref 70–99)
Glucose-Capillary: 149 mg/dL — ABNORMAL HIGH (ref 70–99)
Glucose-Capillary: 150 mg/dL — ABNORMAL HIGH (ref 70–99)

## 2021-05-16 LAB — VALPROIC ACID LEVEL: Valproic Acid Lvl: <10 ug/mL — ABNORMAL LOW (ref 50.0–100.0)

## 2021-05-16 LAB — AMMONIA: Ammonia: 28 umol/L (ref 9–35)

## 2021-05-16 MED ORDER — ASPIRIN 81 MG PO CHEW
81.0000 mg | CHEWABLE_TABLET | Freq: Every day | ORAL | Status: DC
  Administered 2021-05-16 – 2021-06-09 (×25): 81 mg
  Filled 2021-05-16 (×25): qty 1

## 2021-05-16 MED ORDER — PANTOPRAZOLE 2 MG/ML SUSPENSION
40.0000 mg | Freq: Every day | ORAL | Status: DC
  Administered 2021-05-16 – 2021-06-12 (×27): 40 mg
  Filled 2021-05-16 (×29): qty 20

## 2021-05-16 MED ORDER — NUTRISOURCE FIBER PO PACK
1.0000 | PACK | Freq: Two times a day (BID) | ORAL | Status: DC
  Administered 2021-05-16 – 2021-05-22 (×13): 1
  Filled 2021-05-16 (×13): qty 1

## 2021-05-16 MED ORDER — SODIUM CHLORIDE 0.9 % IV SOLN
3.0000 g | Freq: Three times a day (TID) | INTRAVENOUS | Status: AC
  Administered 2021-05-16 – 2021-05-20 (×14): 3 g via INTRAVENOUS
  Filled 2021-05-16 (×15): qty 8

## 2021-05-16 MED ORDER — SODIUM CHLORIDE 0.9 % IV SOLN: INTRAVENOUS | Status: DC | PRN

## 2021-05-16 MED ORDER — LORAZEPAM 2 MG/ML IJ SOLN
2.0000 mg | INTRAMUSCULAR | Status: AC
  Administered 2021-05-16: 2 mg via INTRAVENOUS
  Filled 2021-05-16: qty 1

## 2021-05-16 NOTE — Progress Notes (Signed)
Pharmacy Antibiotic Note  Kimberly MANCUSI is a 70 y.o. female admitted on 05/13/2021 with pneumonia.  Pharmacy has been consulted for Unasyn dosing. Renal function has improved with Scr 2.13 (CrCl 30.3 mL/min) and increased urine output, and she is still febrile. CSF is still NGTD and respiratory cultures were collected.  Plan: Increase frequency Unasyn 3 g q8h Monitor renal function and clinical status for dose adjustments  Height: 5\' 4"  (162.6 cm) Weight: 113.4 kg (250 lb) IBW/kg (Calculated) : 54.7  Temp (24hrs), Avg:99.6 F (37.6 C), Min:98.9 F (37.2 C), Max:100.7 F (38.2 C)  Recent Labs  Lab 05/13/21 1340 05/13/21 1342 05/13/21 1345 05/14/21 0310 05/15/21 0247 05/16/21 0043  WBC 6.1  --   --   --  10.3 9.1  CREATININE 2.26* 2.30*  --  2.46* 2.61* 2.13*  LATICACIDVEN  --   --  1.1  --   --   --      Estimated Creatinine Clearance: 30.3 mL/min (A) (by C-G formula based on SCr of 2.13 mg/dL (H)).    Allergies  Allergen Reactions   Chlorpromazine Rash    Antimicrobials this admission: Vancomycin 10/31 >> 11/2 Ceftriaxone 10/31 >> 11/2 Ampicillin 10/31 >> 11/2 Acyclovir 10/31 >> 11/2 Unasyn 11/2 >>   Dose adjustments this admission: 11/3 Changed q12h to q8h  Microbiology results: 10/31 BCx: NGTD 10/31 UCx: needs recollection, multiple species present  10/31 MRSA PCR: not detected 11/1 CSF: no organisms seen  11/2 Respiratory Cx: collected   Thank you for allowing pharmacy to be a part of this patient's care.  Varney Daily, PharmD PGY1 Pharmacy Resident  Please check AMION for all [facility] pharmacy phone numbers After 10:00 PM call main pharmacy [phone]

## 2021-05-16 NOTE — Progress Notes (Signed)
Neurology Progress Note  S:Patient is unable to participate in ROS due to mental status. Per RN, she has been withdrawing to pain in her LEs, but not UEs. PCCM in room also.   O: Current vital signs: BP (!) 106/44   Pulse 80   Temp 100 F (37.8 C) (Axillary)   Resp (!) 22   Ht $R'5\' 4"'Hc$  (1.626 m)   Wt 113.4 kg   SpO2 99%   BMI 42.91 kg/m  Vital signs in last 24 hours: Temp:  [98.9 F (37.2 C)-100.7 F (38.2 C)] 100 F (37.8 C) (11/03 0725) Pulse Rate:  [66-89] 80 (11/03 0800) Resp:  [13-27] 22 (11/03 0800) BP: (90-145)/(44-101) 106/44 (11/03 0800) SpO2:  [94 %-100 %] 99 % (11/03 0800) FiO2 (%):  [30 %] 30 % (11/03 0800) Weight:  [113.4 kg] 113.4 kg (11/03 0500)  GENERAL: Acutely ill appearing obese female on ventilator in ICU.  HEENT: Normocephalic and atraumatic. EEG leads on. ETT in place.  LUNGS: On ventilator.  CV: RRR on tele.  Ext: warm.  NEURO: GCS motor 4      verbal 1       eye 1- total 6.  Mental Status: comatose. Not following commands.  Speech/Language: No sounds.   Cranial Nerves:  II: PERRL. Gaze is conjugate and downward today.  III, IV, VI: Corneal reflex intact.  V, VII: Face is grossly symmetric. Tongue protrudes around ETT. No reaction to brow pressure.   VIII: No response to loud name calling or loud clap.  XI, X:  DE:YCXK is grossly midline.  XII: Tongue protrudes at ETT. No fasciculations.  Motor/Sensation:  No spontaneous or purposeful movements of extremities. Withdraws to pain in LEs, not in UEs. No localization.  Cerebellar: Upon arrival, NP noted some low amplitude fasciculation type tremors to left trapezius and left thigh distally at knee. No clonus, jerking, shaking, or tremors elsewhere. cEEG button pushed.    DTRs:  BUE 1     BLE 0 Gait- deferred pt on bedrest.  Medications  Current Facility-Administered Medications:    acetaminophen (TYLENOL) tablet 650 mg, 650 mg, Per Tube, Q4H PRN, Bowser, Laurel Dimmer, NP, 650 mg at 05/15/21 0749    Ampicillin-Sulbactam (UNASYN) 3 g in sodium chloride 0.9 % 100 mL IVPB, 3 g, Intravenous, Q12H, Jerilynn Birkenhead, RPH, Stopped at 05/16/21 0051   aspirin chewable tablet 81 mg, 81 mg, Per Tube, Daily, [name], MD   carvedilol (COREG) tablet 6.25 mg, 6.25 mg, Per Tube, BID WC, Kara Mead V, MD, 6.25 mg at 05/16/21 0733   chlorhexidine gluconate (MEDLINE KIT) (PERIDEX) 0.12 % solution 15 mL, 15 mL, Mouth Rinse, BID, Bowser, Grace E, NP, 15 mL at 05/16/21 0729   Chlorhexidine Gluconate Cloth 2 % PADS 6 each, 6 each, Topical, Daily, Chand, Sudham, MD, 6 each at 05/15/21 1200   docusate (COLACE) 50 MG/5ML liquid 100 mg, 100 mg, Per Tube, BID PRN, Henri Medal, RPH   feeding supplement (PROSource TF) liquid 45 mL, 45 mL, Per Tube, TID, Rigoberto Noel, MD, 45 mL at 05/15/21 2146   feeding supplement (VITAL 1.5 CAL) liquid 1,000 mL, 1,000 mL, Per Tube, Continuous, Rigoberto Noel, MD, Last Rate: 50 mL/hr at 05/15/21 2145, 1,000 mL at 05/15/21 2145   fentaNYL (SUBLIMAZE) injection 25 mcg, 25 mcg, Intravenous, Q15 min PRN, Bowser, Laurel Dimmer, NP   fentaNYL (SUBLIMAZE) injection 25-100 mcg, 25-100 mcg, Intravenous, Q30 min PRN, Pierce, Dwayne A, RPH   heparin injection 5,000 Units,  5,000 Units, Subcutaneous, Q8H, Bowser, Grace E, NP, 5,000 Units at 05/16/21 0629   insulin aspart (novoLOG) injection 0-6 Units, 0-6 Units, Subcutaneous, Q4H, Rigoberto Noel, MD, 1 Units at 05/15/21 2050   lactated ringers infusion, , Intravenous, Continuous, Bowser, Laurel Dimmer, NP   levETIRAcetam (KEPPRA) IVPB 500 mg/100 mL premix, 500 mg, Intravenous, Q12H, Amie Portland, MD, Stopped at 05/16/21 [phone]   MEDLINE mouth rinse, 15 mL, Mouth Rinse, 10 times per day, Bowser, Shirlee Limerick E, NP, 15 mL at 05/16/21 0636   ondansetron (ZOFRAN) injection 4 mg, 4 mg, Intravenous, Q6H PRN, Bowser, Laurel Dimmer, NP   pantoprazole (PROTONIX) injection 40 mg, 40 mg, Intravenous, QHS, Bowser, Grace E, NP, 40 mg at 05/15/21 2149   polyethylene glycol  (MIRALAX / GLYCOLAX) packet 17 g, 17 g, Per Tube, Daily PRN, Bowser, Laurel Dimmer, NP  Pertinent Labs Creat 2.13 (2.61) CK pending today (1948).   No new imaging.   rEEG 05/14/21 per Dr. Quinn Axe.  There were 8 electrographic seizures during this 42 minute recording that ranged in duration from 30 seconds to 4 minutes. Each seizure begins with very low amplitude fast activity in the left frontotemporal leads and spreading in <1 second to the right frontotemporal leads after which the activity builds up into spike wave activity at 1 Hz, generalized but best seen over the left temporal region, until it abates and is followed by brief relative suppression. Accompanying video for multiple events reviewed and patient appeared to be either symptomatic or was seen to tense his shoulders. No button pushes throughout recording.  Impression and clinical correlation: This EEG was obtained while awake and drowsy and is abnormal due to: - Frequent subclinical electrographic seizures likely originating in the left temporal region and frequently generalizing (seizure activity present in estimated >50% of recording). - Moderate diffuse slowing  LTM ABNORMALITY - Periodic discharges with triphasic morphology, generalized ( GPDs) - Continuous slow, generalized   IMPRESSION: This study showed generalized periodic discharges (GPDs) with triphasic morphology at $RemoveBefor'1Hz'DbDrgzrrYXQb$  which at times appear rhythmic . This is on the ictal-interictal continuum with moderate potential for seizures. There is also moderate diffuse encephalopathy, nonspecific etiology but could be secondary to toxic-metabolic causes. No definite seizures were seen throughout the recording.   Assessment: 70 y.o. female who presented to the ED 10/31 for evaluation of unresponsiveness with hypoglycemia (CBG in the 30's PTA) without improvement in mental status following correction of hypoglycemia. T max 100 F axillary. Preliminary LP results unremarkable. Gram stain  negative thus far. HSV is still pending. Acyclovir on board. There were changes on rEEG yesterday thought to be consistent with seizure activity, so patient was loaded with Keppra and $RemoveBe'500mg'fzprhwWIz$  IV q12 hours maintenance and placed on cEEG. However, so far, the LTM is only showing triphasic waves which is most consistent with metabolic disorder such as her hours of unresolved hypoglycemia. She was on VPA at home possibly for mood, and will check a level and ammonia.    Impression: -comatose.  -prolonged hypoglycemic state.  -fevers.  -seizure like activity.   Recommendations/Plan:  -Continue Keppra $RemoveBeforeDE'500mg'rwMOEsBILxIkzAI$  IV q12 hrs.  -VPA level and ammonia ordered.  -DC LTM.  -Will need to call and discuss findings/EEG with daughter.  -Continue to treat metabolic and infectious derangements as you are doing.  -Continue Acyclovir until HSV results negative.  -Neurology will continue to follow.   Discussed with Dr. Elsworth Soho, PCCM at bedside and over chat per Dr. Rory Percy.   Pt seen by Clance Boll, MSN,  APN-BC/Nurse Practitioner/Neuro and later by MD. Note and plan to be edited as needed by MD.  Pager: [phone]  Attending addendum Patient seen and examined No acute changes overnight Long-term EEG consistent with triphasic morphology-likely toxic metabolic etiology. On examination minimal withdrawal in the left lower extremity with withdrawal versus triple flexion in the right lower extremity.  Minimal withdrawal in bilateral upper extremities. Pupils equal round reactive to light with slightly disconjugate gaze. Continue Keppra for now Check Depakote and ammonia level Correction of toxic metabolic derangements per primary team as you are Continue acyclovir till HSV PCR comes negative Plan discussed with Dr. Elsworth Soho  -- Amie Portland, MD Neurologist [facility] Pager: [phone]

## 2021-05-16 NOTE — Progress Notes (Signed)
Discontinued cEEG study.  Notified [facility] monitoring.  No skin breakdown observed. 

## 2021-05-16 NOTE — Progress Notes (Addendum)
eLink Physician-Brief Progress Note Patient Name: Kimberly Gross DOB: 1951/04/27 MRN: [id number]   Date of Service  05/16/2021  HPI/Events of Note  Notified of having liquid stools.  Nurse requesting for flexiseal.   eICU Interventions  Flexiseal ordered.     Intervention Category Minor Interventions: Other:  [name] 05/16/2021, 12:41 AM  4:00 AM Notified of EKG changes.  EKG reviewed showing sinus rhythm, T wave inversion in leads V1 and V2, incomplete RBBB already seen in prior EKG.   Pt is intubated.   Plan> Check troponin and lipid panel now.   Will start on ASA.

## 2021-05-16 NOTE — Procedures (Addendum)
Patient Name: AVALEIGH DECUIR  MRN: [id number]  Epilepsy Attending: Lora Havens  Referring Physician/Provider: Dr Amie Portland Duration: 05/15/2021 2044 to 05/16/2021 3734  Patient history: 70 y.o. female with a history of encephalopathy who is undergoing an EEG to evaluate for seizures.  Level of alertness:  lethargic  AEDs during EEG study: LEV  Technical aspects: This EEG study was done with scalp electrodes positioned according to the 10-20 International system of electrode placement. Electrical activity was acquired at a sampling rate of 500Hz  and reviewed with a high frequency filter of 70Hz  and a low frequency filter of 1Hz . EEG data were recorded continuously and digitally stored.   Description: EEG initially showed continuous generalized 3 to 6 Hz theta-delta slowing. Intermittent generalized periodic discharges (GPDs) with triphasic morphology at  1Hz  were also noted which at times appear rhythmic.Gradually the GPDs with triphasic morphology improved and eeg predominantly showed 5-7hz  theta slowing as well as intermittent 2-3hz  delta slowing. Hyperventilation and photic stimulation were not performed.     ABNORMALITY - Periodic discharges with triphasic morphology, generalized ( GPDs) - Continuous slow, generalized  IMPRESSION: This study showed generalized periodic discharges (GPDs) with triphasic morphology at 1Hz  which at times appear rhythmic . This is on the ictal-interictal continuum with moderate potential for seizures. There is also moderate diffuse encephalopathy, nonspecific etiology but could be secondary to toxic-metabolic causes. No definite seizures were seen throughout the recording.  Dyasia Firestine Barbra Sarks

## 2021-05-16 NOTE — Progress Notes (Signed)
NAME:  Kimberly Gross, MRN:  [id number], DOB:  [birth year], LOS: 3 ADMISSION DATE:  05/13/2021, CONSULTATION DATE:  05/13/21 REFERRING MD:  Ronnald Nian - EM, CHIEF COMPLAINT:  AMS    History of Present Illness:  70 yo F PMH CKD IV, DM, bipolar disorder, HTN, Osa on CPAP who was found obtunded in bed by family member 10/31 afternoon found to be severely hypoglycemic which improved with glucagon but no improvement in mental status. Intubated for GCS 3. CT head unremarkable, CBC and CMP also unremarkable.   Pertinent  Medical History  CKD IV Bipolar Disorder DM2  HLD Anxiety HTN OSA on CPAP  Significant Hospital Events: Including procedures, antibiotic start and stop dates in addition to other pertinent events   10/31 Obtunded and hypoglycemic for EMS, mentation did not improve with glucagon. Intubated in ED, CT H non acute. Admitting to PCCM  11/1 LP performed by IR, CSF clear with no organisms on gram stain 11/2 routine EEG read as seizure activity, loaded with levetiracetam 11/3 LTM EEG reviewed, correlating with metabolic encephalopathy rather than epileptiform activity  Interim History / Subjective:   Critically ill, intubated Remains obtunded, GCS 4 Intermittently febrile T-max 100.7 F   Objective   Blood pressure (!) 120/53, pulse 79, temperature 100 F (37.8 C), temperature source Axillary, resp. rate 13, height 5\' 4"  (1.626 m), weight 113.4 kg, SpO2 100 %.    Vent Mode: PRVC FiO2 (%):  [30 %] 30 % Set Rate:  [18 bmp] 18 bmp Vt Set:  [440 mL] 440 mL PEEP:  [5 cmH20] 5 cmH20 Pressure Support:  [10 cmH20] 10 cmH20 Plateau Pressure:  [16 cmH20-20 cmH20] 20 cmH20   Intake/Output Summary (Last 24 hours) at 05/16/2021 0751 Last data filed at 05/16/2021 0700 Gross per 24 hour  Intake 1160.91 ml  Output 685 ml  Net 475.91 ml    Filed Weights   05/14/21 0324 05/15/21 0500 05/16/21 0500  Weight: 111.7 kg 111.7 kg 113.4 kg    Examination: General: Chronically and  critically ill appearing obese older adult F intubated, obtunded, NAD  Eyes: Anicteric sclera, PERRL. Fixed downward gaze bilaterally Neck: supple, no nuchal rigidity HENT: NCAT redundant neck tissue. ETT secure.  Lungs: CTAb. Symmetrical chest expansion, mechanically ventilated  Cardiovascular: RRR, I2L7, 2/6 systolic murmur at the LUSB Abdomen: Obese soft ndnt Extremities: No acute joint deformity. No cyanosis or clubbing  Neuro: Unable to follow 1-step commands. Minimal withdrawal to pain only in lower extremities.  Labs show improving creatinine, mild anemia, no leukocytosis   Resolved Hospital Problem list     Assessment & Plan:   Acute metabolic encephalopathy Suspect hypoglycemic encephalopathy from prolonged hypoglycemia, prognosis is poor given minimal improvement. -neurology consulted -LTM EEG per neuro -continue ampicillin-sulbactam -f/u remaining CSF studies, HSV PCR -minimize CNS depressing meds as able -thiamine  Acute respiratory failure with hypoxia -full MV support, wean as able -VAP, pulm hygiene -PAD for RASS goal  0 -- sedation currently off.   Left upper extremity edema - DVT US negative  CKD IV  Elevated CK  -trend Cr -Foley -strict I/O   DM2  Hypoglycemia - vsSSI -q4hr CBG   Diarrhea Liquid stools noted overnight, Flexiseal placed. - RD to adjust tube feeds as needed  HTN HLD -carvedilol restarted at lower dose -hold other home meds   Best Practice (right click and "Reselect all SmartList Selections" daily)   Diet/type: tubefeeds and NPO, wean IV fluids with start of tube feeds DVT prophylaxis: prophylactic heparin  GI prophylaxis: PPI Lines: N/A Foley:  N/A Code Status:  full code Last date of multidisciplinary goals of care discussion [pending. Daughter updated at bedside 11/1]  Labs   CBC: Recent Labs  Lab 05/13/21 1340 05/13/21 1342 05/13/21 1343 05/13/21 1540 05/15/21 0247 05/16/21 0043  WBC 6.1  --   --   --  10.3  9.1  NEUTROABS 4.5  --   --   --   --   --   HGB 13.2 14.3 14.3 12.9 11.4* 10.3*  HCT 42.7 42.0 42.0 38.0 35.8* 32.8*  MCV 94.7  --   --   --  93.0 93.4  PLT 301  --   --   --  232 193     Basic Metabolic Panel: Recent Labs  Lab 05/13/21 1340 05/13/21 1342 05/13/21 1343 05/13/21 1540 05/13/21 2119 05/14/21 0310 05/15/21 0247 05/16/21 0043  NA 141 140 140 140  --  134* 135 138  K 4.6 4.6 4.6 4.8  --  4.8 5.4* 4.2  CL 103 103  --   --   --  98 100 103  CO2 27  --   --   --   --  24 26 24   GLUCOSE 70 72  --   --   --  95 169* 166*  BUN 40* 48*  --   --   --  40* 45* 47*  CREATININE 2.26* 2.30*  --   --   --  2.46* 2.61* 2.13*  CALCIUM 9.4  --   --   --   --  8.5* 8.3* 8.4*  MG  --   --   --   --  2.1 2.1 2.1  --   PHOS  --   --   --   --   --   --  4.0  --     GFR: Estimated Creatinine Clearance: 30.3 mL/min (A) (by C-G formula based on SCr of 2.13 mg/dL (H)). Recent Labs  Lab 05/13/21 1340 05/13/21 1345 05/15/21 0247 05/16/21 0043  WBC 6.1  --  10.3 9.1  LATICACIDVEN  --  1.1  --   --      Liver Function Tests: Recent Labs  Lab 05/13/21 1340  AST 86*  ALT 28  ALKPHOS 39  BILITOT 0.8  PROT 6.6  ALBUMIN 3.5    No results for input(s): LIPASE, AMYLASE in the last 168 hours. Recent Labs  Lab 05/13/21 1349  AMMONIA 35     ABG    Component Value Date/Time   PHART 7.401 05/13/2021 1540   PCO2ART 53.0 (H) 05/13/2021 1540   PO2ART 520 (H) 05/13/2021 1540   HCO3 32.9 (H) 05/13/2021 1540   TCO2 34 (H) 05/13/2021 1540   O2SAT 100.0 05/13/2021 1540      Coagulation Profile: Recent Labs  Lab 05/13/21 1340  INR 1.0     Cardiac Enzymes: Recent Labs  Lab 05/13/21 1340 05/14/21 0310  CKTOTAL 2,377* 1,948*     HbA1C: Hgb A1c MFr Bld  Date/Time Value Ref Range Status  09/12/2020 11:52 AM 5.1 4.8 - 5.6 % Final    Comment:             Prediabetes: 5.7 - 6.4          Diabetes: >6.4          Glycemic control for adults with diabetes: <7.0    03/06/2020 12:12 PM 8.3 (H) 4.8 - 5.6 % Final    Comment:  Prediabetes: 5.7 - 6.4          Diabetes: >6.4          Glycemic control for adults with diabetes: <7.0     CBG: Recent Labs  Lab 05/15/21 1529 05/15/21 1937 05/15/21 2332 05/16/21 0348 05/16/21 0720  GLUCAP 153* 159* 135* 133* 122*     Critical care time:      Zola Button, MD PGY-2 [facility] 05/16/2021, 7:51 AM

## 2021-05-16 NOTE — Progress Notes (Signed)
EEG maintain done. Impedance good. Skin breakdown (redness only) seen under f7, so elecrode was moved slightly off site inward toward fp1. Results pending.

## 2021-05-17 ENCOUNTER — Inpatient Hospital Stay (HOSPITAL_COMMUNITY): Payer: Medicare (Managed Care)

## 2021-05-17 DIAGNOSIS — R4182 Altered mental status, unspecified: Secondary | ICD-10-CM | POA: Diagnosis not present

## 2021-05-17 DIAGNOSIS — G9340 Encephalopathy, unspecified: Secondary | ICD-10-CM | POA: Diagnosis not present

## 2021-05-17 DIAGNOSIS — E162 Hypoglycemia, unspecified: Secondary | ICD-10-CM | POA: Diagnosis not present

## 2021-05-17 DIAGNOSIS — J9601 Acute respiratory failure with hypoxia: Secondary | ICD-10-CM | POA: Diagnosis not present

## 2021-05-17 LAB — GLUCOSE, CAPILLARY
Glucose-Capillary: 134 mg/dL — ABNORMAL HIGH (ref 70–99)
Glucose-Capillary: 118 mg/dL — ABNORMAL HIGH (ref 70–99)
Glucose-Capillary: 127 mg/dL — ABNORMAL HIGH (ref 70–99)
Glucose-Capillary: 127 mg/dL — ABNORMAL HIGH (ref 70–99)
Glucose-Capillary: 134 mg/dL — ABNORMAL HIGH (ref 70–99)
Glucose-Capillary: 121 mg/dL — ABNORMAL HIGH (ref 70–99)
Glucose-Capillary: 139 mg/dL — ABNORMAL HIGH (ref 70–99)

## 2021-05-17 LAB — CBC WITH DIFFERENTIAL/PLATELET
Abs Immature Granulocytes: 0.03 10*3/uL (ref 0.00–0.07)
Basophils Absolute: 0.1 10*3/uL (ref 0.0–0.1)
Basophils Relative: 1 %
Eosinophils Absolute: 0.5 10*3/uL (ref 0.0–0.5)
Eosinophils Relative: 6 %
HCT: 32.6 % — ABNORMAL LOW (ref 36.0–46.0)
Hemoglobin: 10.2 g/dL — ABNORMAL LOW (ref 12.0–15.0)
Immature Granulocytes: 0 %
Lymphocytes Relative: 30 %
Lymphs Abs: 2.3 10*3/uL (ref 0.7–4.0)
MCH: 29.7 pg (ref 26.0–34.0)
MCHC: 31.3 g/dL (ref 30.0–36.0)
MCV: 94.8 fL (ref 80.0–100.0)
Monocytes Absolute: 1 10*3/uL (ref 0.1–1.0)
Monocytes Relative: 14 %
Neutro Abs: 3.8 10*3/uL (ref 1.7–7.7)
Neutrophils Relative %: 49 %
Platelets: 256 10*3/uL (ref 150–400)
RBC: 3.44 MIL/uL — ABNORMAL LOW (ref 3.87–5.11)
RDW: 12.3 % (ref 11.5–15.5)
WBC: 7.6 10*3/uL (ref 4.0–10.5)
nRBC: 0 % (ref 0.0–0.2)

## 2021-05-17 LAB — COMPREHENSIVE METABOLIC PANEL
ALT: 42 U/L (ref 0–44)
AST: 74 U/L — ABNORMAL HIGH (ref 15–41)
Albumin: 2.3 g/dL — ABNORMAL LOW (ref 3.5–5.0)
Alkaline Phosphatase: 48 U/L (ref 38–126)
Anion gap: 8 (ref 5–15)
BUN: 50 mg/dL — ABNORMAL HIGH (ref 8–23)
CO2: 26 mmol/L (ref 22–32)
Calcium: 8.8 mg/dL — ABNORMAL LOW (ref 8.9–10.3)
Chloride: 108 mmol/L (ref 98–111)
Creatinine, Ser: 1.89 mg/dL — ABNORMAL HIGH (ref 0.44–1.00)
GFR, Estimated: 28 mL/min — ABNORMAL LOW (ref >60)
Glucose, Bld: 162 mg/dL — ABNORMAL HIGH (ref 70–99)
Potassium: 4.7 mmol/L (ref 3.5–5.1)
Sodium: 142 mmol/L (ref 135–145)
Total Bilirubin: 0.5 mg/dL (ref 0.3–1.2)
Total Protein: 5.6 g/dL — ABNORMAL LOW (ref 6.5–8.1)

## 2021-05-17 LAB — MAGNESIUM: Magnesium: 2.6 mg/dL — ABNORMAL HIGH (ref 1.7–2.4)

## 2021-05-17 LAB — HSV 1/2 PCR, CSF
HSV-1 DNA: NEGATIVE
HSV-2 DNA: NEGATIVE

## 2021-05-17 IMAGING — DX DG HIP (WITH OR WITHOUT PELVIS) 1V PORT*L*
2 series · 3 of 3 positions shown · non-contrast
Comparison: None

CLINICAL DATA: Pain

EXAM:
DG HIP (WITH OR WITHOUT PELVIS) 1V PORT LEFT

[pelvis]
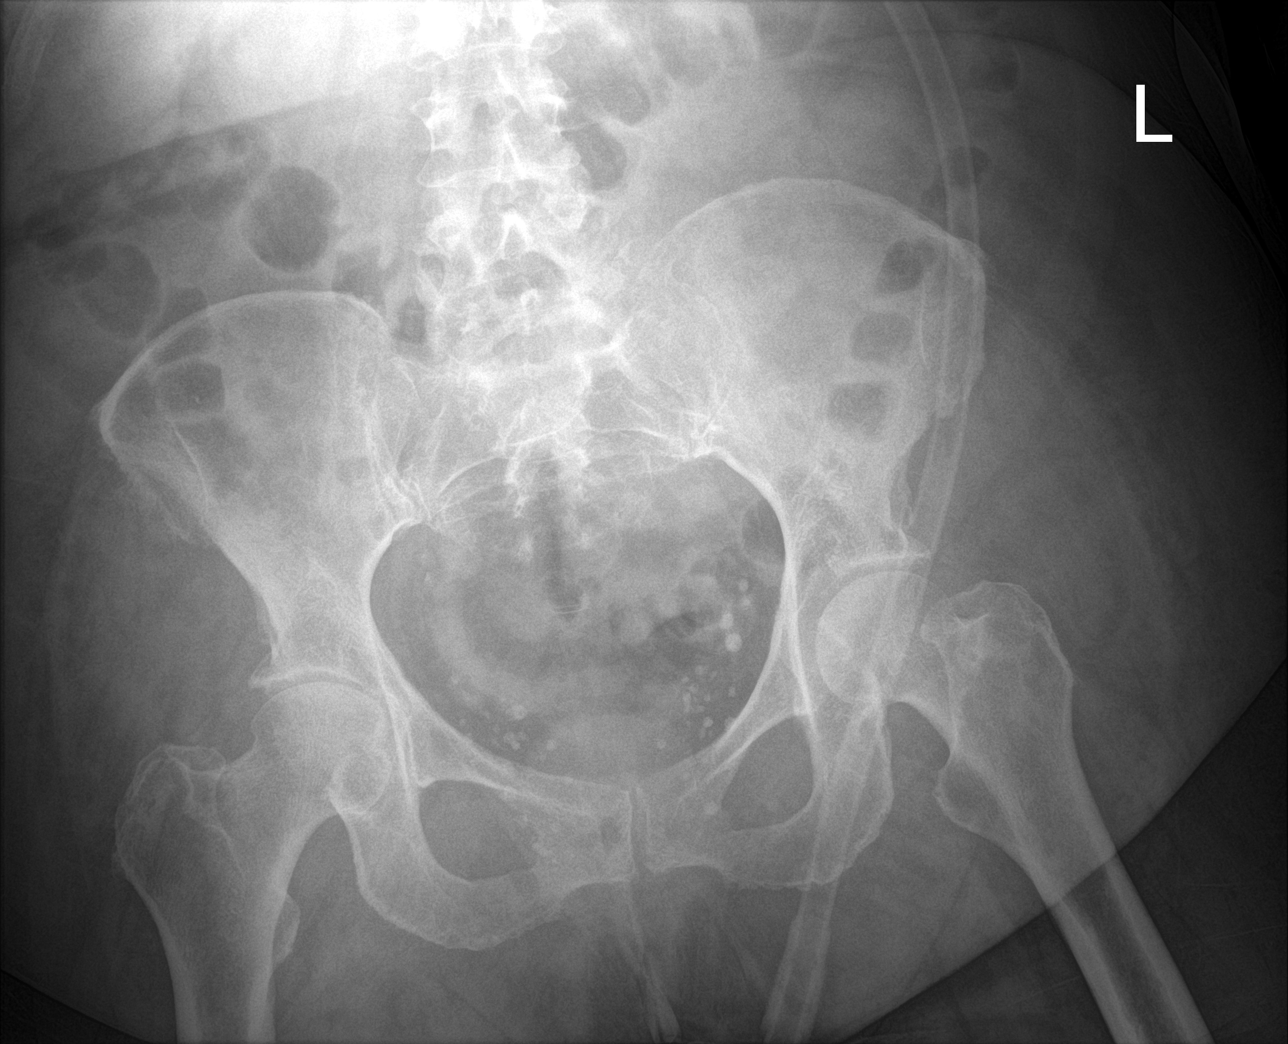

[Series 2: hip · 0.14mm/px · 2 of 2 slices shown]
[im 1/2]
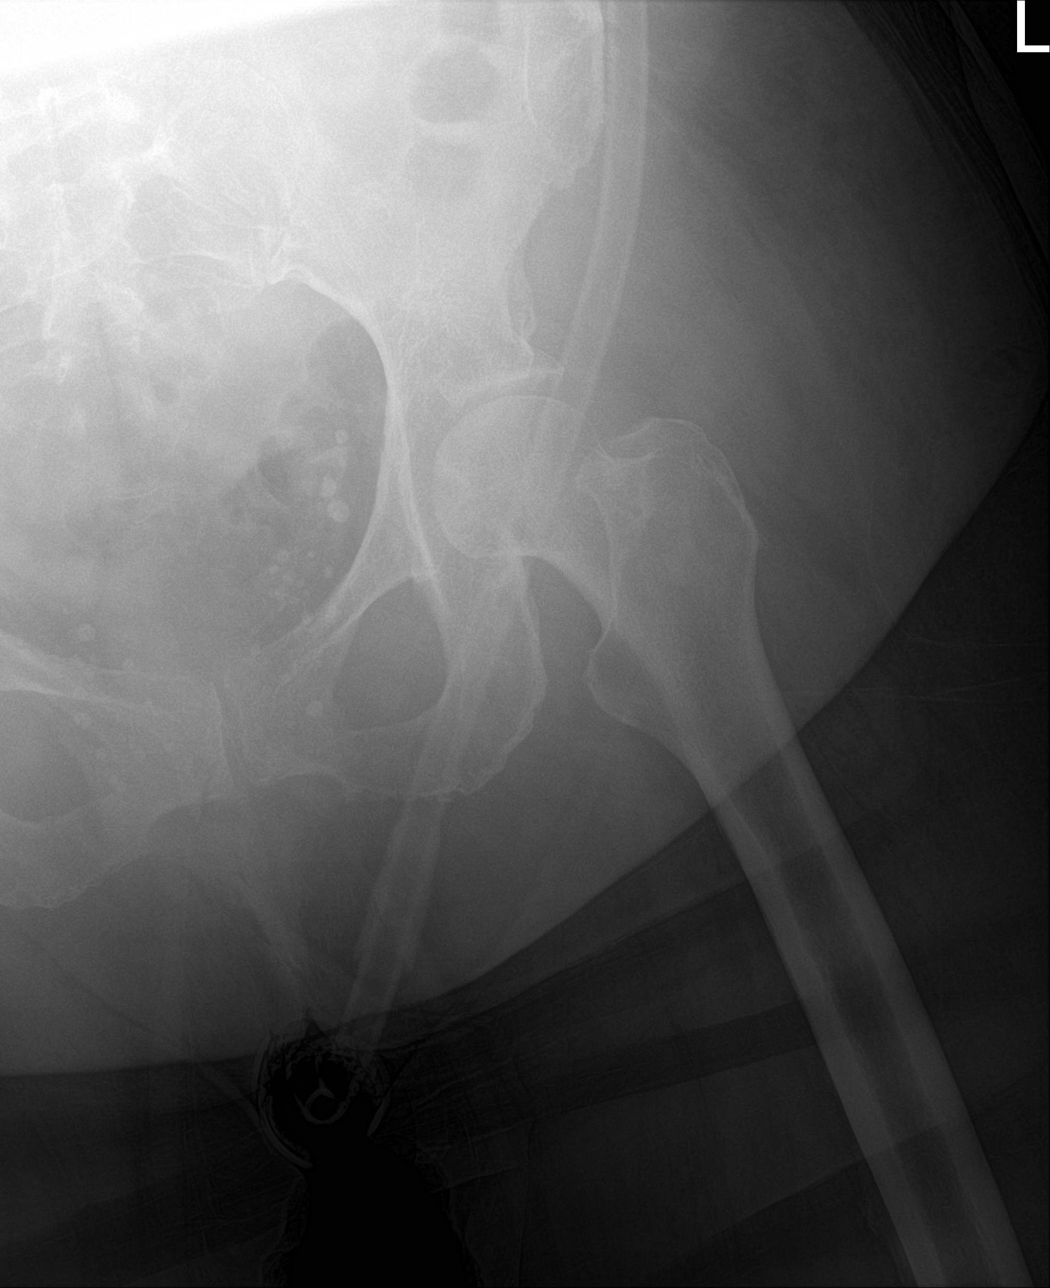
[im 2/2]
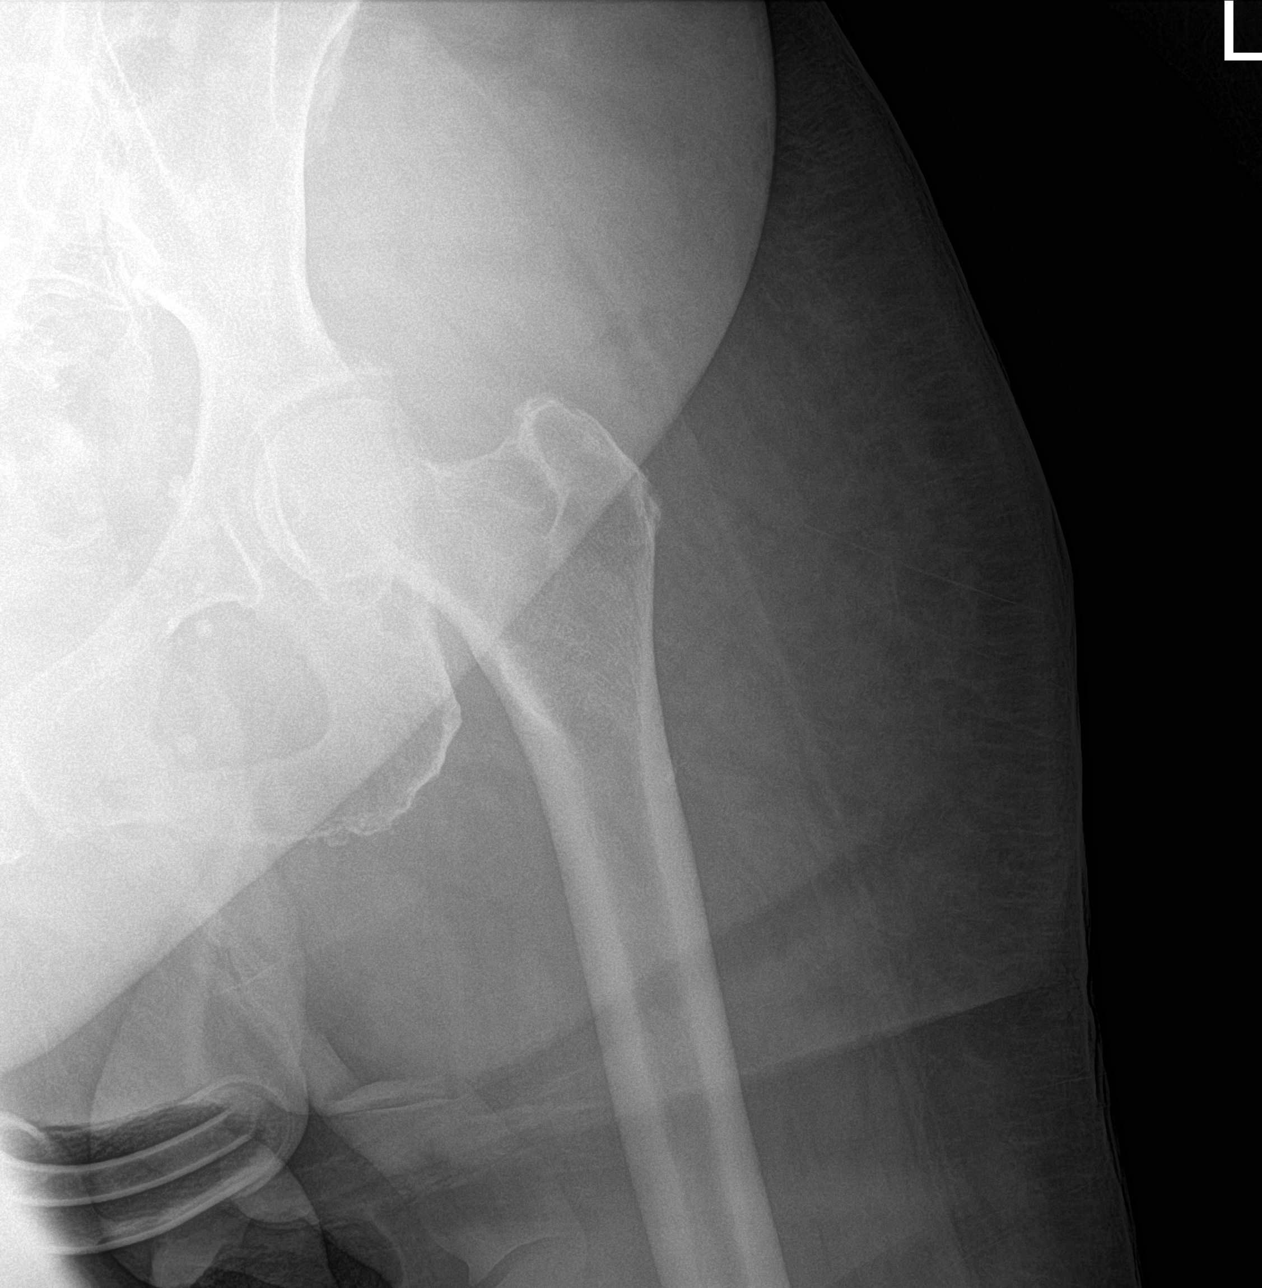

[3 of 3 positions shown; findings below may reference images not displayed]

FINDINGS: No displaced fractures or dislocation is seen. In image 3, there is
questionable minimal cortical irregularity in the lateral aspect of
subcapital portion of left femur. This may be an artifact or
undisplaced fracture. This finding could not be seen in the rest of
the images. There are numerous vascular calcifications in the
pelvis. Degenerative changes are noted in the lower lumbar spine
IMPRESSION: No displaced fracture or dislocation is seen. There is questionable
minimal cortical irregularity in the lateral aspect of the
subcapital portion of neck of left femur which may be an artifact or
undisplaced fracture. If clinically warranted, follow-up CT or MRI
may be considered.

## 2021-05-17 NOTE — Progress Notes (Deleted)
Neurology Progress Note  S:Patient is unable to participate in ROS due to mental status. Per RN, she has been withdrawing to pain in her LEs, but not UEs. PCCM in room also.   O: Current vital signs: BP (!) 143/81   Pulse 71   Temp 99.8 F (37.7 C) (Oral)   Resp 17   Ht $R'5\' 4"'SX$  (1.626 m)   Wt 114.7 kg   SpO2 100%   BMI 43.40 kg/m  Vital signs in last 24 hours: Temp:  [99.3 F (37.4 C)-100.4 F (38 C)] 99.8 F (37.7 C) (11/04 1119) Pulse Rate:  [69-83] 71 (11/04 1200) Resp:  [15-27] 17 (11/04 1200) BP: (114-156)/(40-81) 143/81 (11/04 1200) SpO2:  [100 %] 100 % (11/04 1200) FiO2 (%):  [30 %] 30 % (11/04 0820) Weight:  [114.7 kg] 114.7 kg (11/04 0412)  GENERAL: Acutely ill appearing obese female on ventilator in ICU.  HEENT: Normocephalic and atraumatic. EEG leads on. ETT in place.  LUNGS: On ventilator.  CV: RRR on tele.  Ext: warm.  NEURO:  Mental Status: comatose. Not following commands.  Speech/Language: No sounds, intubated CN: PERRL, unable to cross gaze midline to L with oculocephalics, (+) corneals, cough, gag. Face symmetric at rest Motor & sensory: no movement to noxious stimuli in any extremity DTRs: 1+ BUE, areflexic BLE with mute toes bilat   Medications  Current Facility-Administered Medications:    0.9 %  sodium chloride infusion, , Intravenous, PRN, Rigoberto Noel, MD, Last Rate: 10 mL/hr at 05/17/21 1300, Infusion Verify at 05/17/21 1300   acetaminophen (TYLENOL) tablet 650 mg, 650 mg, Per Tube, Q4H PRN, Bowser, Laurel Dimmer, NP, 650 mg at 05/15/21 0749   Ampicillin-Sulbactam (UNASYN) 3 g in sodium chloride 0.9 % 100 mL IVPB, 3 g, Intravenous, Q8H, Jerilynn Birkenhead, RPH, Stopped at 05/17/21 1540   aspirin chewable tablet 81 mg, 81 mg, Per Tube, Daily, [name], MD, 81 mg at 05/17/21 0903   carvedilol (COREG) tablet 6.25 mg, 6.25 mg, Per Tube, BID WC, Kara Mead V, MD, 6.25 mg at 05/17/21 0717   chlorhexidine gluconate (MEDLINE KIT) (PERIDEX) 0.12 %  solution 15 mL, 15 mL, Mouth Rinse, BID, Bowser, Grace E, NP, 15 mL at 05/17/21 0709   Chlorhexidine Gluconate Cloth 2 % PADS 6 each, 6 each, Topical, Daily, Chand, Sudham, MD, 6 each at 05/17/21 1015   docusate (COLACE) 50 MG/5ML liquid 100 mg, 100 mg, Per Tube, BID PRN, Henri Medal, RPH   feeding supplement (PROSource TF) liquid 45 mL, 45 mL, Per Tube, TID, Rigoberto Noel, MD, 45 mL at 05/17/21 0904   feeding supplement (VITAL 1.5 CAL) liquid 1,000 mL, 1,000 mL, Per Tube, Continuous, Rigoberto Noel, MD, Last Rate: 50 mL/hr at 05/16/21 1802, 1,000 mL at 05/16/21 1802   fiber (NUTRISOURCE FIBER) 1 packet, 1 packet, Per Tube, BID, Zola Button, MD, 1 packet at 05/17/21 0904   heparin injection 5,000 Units, 5,000 Units, Subcutaneous, Q8H, Bowser, Grace E, NP, 5,000 Units at 05/17/21 1343   insulin aspart (novoLOG) injection 0-6 Units, 0-6 Units, Subcutaneous, Q4H, Rigoberto Noel, MD, 1 Units at 05/15/21 2050   lactated ringers infusion, , Intravenous, Continuous, Bowser, Laurel Dimmer, NP   levETIRAcetam (KEPPRA) IVPB 500 mg/100 mL premix, 500 mg, Intravenous, Q12H, Amie Portland, MD, Stopped at 05/17/21 [phone]   MEDLINE mouth rinse, 15 mL, Mouth Rinse, 10 times per day, Bowser, Shirlee Limerick E, NP, 15 mL at 05/17/21 1342   ondansetron (ZOFRAN) injection 4 mg, 4  mg, Intravenous, Q6H PRN, Bowser, Laurel Dimmer, NP   pantoprazole sodium (PROTONIX) 40 mg/20 mL oral suspension 40 mg, 40 mg, Per Tube, QHS, Jerilynn Birkenhead, RPH, 40 mg at 05/16/21 2150   polyethylene glycol (MIRALAX / GLYCOLAX) packet 17 g, 17 g, Per Tube, Daily PRN, Bowser, Laurel Dimmer, NP  Pertinent Labs Creat 2.13 (2.61) CK pending today (1948).   No new imaging.   rEEG 05/14/21 per Dr. Quinn Axe.  There were 8 electrographic seizures during this 42 minute recording that ranged in duration from 30 seconds to 4 minutes. Each seizure begins with very low amplitude fast activity in the left frontotemporal leads and spreading in <1 second to the right  frontotemporal leads after which the activity builds up into spike wave activity at 1 Hz, generalized but best seen over the left temporal region, until it abates and is followed by brief relative suppression. Accompanying video for multiple events reviewed and patient appeared to be either symptomatic or was seen to tense his shoulders. No button pushes throughout recording.  Impression and clinical correlation: This EEG was obtained while awake and drowsy and is abnormal due to: - Frequent subclinical electrographic seizures likely originating in the left temporal region and frequently generalizing (seizure activity present in estimated >50% of recording). - Moderate diffuse slowing  LTM ABNORMALITY - Periodic discharges with triphasic morphology, generalized ( GPDs) - Continuous slow, generalized   IMPRESSION: This study showed generalized periodic discharges (GPDs) with triphasic morphology at $RemoveBefor'1Hz'fHckGOInLvVw$  which at times appear rhythmic . This is on the ictal-interictal continuum with moderate potential for seizures. There is also moderate diffuse encephalopathy, nonspecific etiology but could be secondary to toxic-metabolic causes. No definite seizures were seen throughout the recording.   Assessment: 70 y.o. female who presented to the ED 10/31 for evaluation of unresponsiveness with hypoglycemia (CBG in the 30's PTA) without improvement in mental status following correction of hypoglycemia. T max 100 F axillary. Preliminary LP results unremarkable. Gram stain negative thus far. HSV is still pending. Acyclovir on board. There were changes on rEEG yesterday thought to be consistent with seizure activity, so patient was loaded with Keppra and $RemoveBe'500mg'qXnvOKACm$  IV q12 hours maintenance and placed on cEEG. However, so far, the LTM is only showing triphasic waves which is most consistent with metabolic disorder such as her hours of unresolved hypoglycemia. She was on VPA at home possibly for mood, and will check a level  and ammonia.    Impression: -comatose.  -prolonged hypoglycemic state.  -fevers.  -seizure like activity.   Recommendations/Plan:  -Continue Keppra $RemoveBeforeDE'500mg'XtYXNMtHbqsDXyb$  IV q12 hrs.  -VPA level and ammonia ordered.  -DC LTM.  -Will need to call and discuss findings/EEG with daughter.  -Continue to treat metabolic and infectious derangements as you are doing.  -Continue Acyclovir until HSV results negative.  -Neurology will continue to follow.   Discussed with Dr. Elsworth Soho, PCCM at bedside and over chat per Dr. Rory Percy.   Pt seen by Clance Boll, MSN, APN-BC/Nurse Practitioner/Neuro and later by MD. Note and plan to be edited as needed by MD.  Pager: [phone]  Attending addendum Patient seen and examined No acute changes overnight Long-term EEG consistent with triphasic morphology-likely toxic metabolic etiology. On examination minimal withdrawal in the left lower extremity with withdrawal versus triple flexion in the right lower extremity.  Minimal withdrawal in bilateral upper extremities. Pupils equal round reactive to light with slightly disconjugate gaze. Continue Keppra for now Check Depakote and ammonia level Correction of toxic metabolic derangements per  primary team as you are Continue acyclovir till HSV PCR comes negative Plan discussed with Dr. Elsworth Soho  -- Amie Portland, MD Neurologist [facility] Pager: [phone]

## 2021-05-17 NOTE — Progress Notes (Signed)
NAME:  Kimberly Gross, MRN:  [id number], DOB:  Mar 16, 1951, LOS: 4 ADMISSION DATE:  05/13/2021, CONSULTATION DATE:  05/13/21 REFERRING MD:  Ronnald Nian - EM, CHIEF COMPLAINT:  AMS    History of Present Illness:  70 yo F PMH CKD IV, DM, bipolar disorder, HTN, O[place] on CPAP who was found obtunded in bed by family member 10/31 afternoon found to be severely hypoglycemic which improved with glucagon but no improvement in mental status. Intubated for GCS 3. CT head unremarkable, CBC and CMP also unremarkable.   Pertinent  Medical History  CKD IV Bipolar Disorder DM2  HLD Anxiety HTN OSA on CPAP  Significant Hospital Events: Including procedures, antibiotic start and stop dates in addition to other pertinent events   10/31 Obtunded and hypoglycemic for EMS, mentation did not improve with glucagon. Intubated in ED, CT H non acute. Admitting to PCCM  11/1 LP performed by IR, CSF clear with no organisms on gram stain 11/2 routine EEG read as seizure activity, loaded with levetiracetam 11/3 LTM EEG reviewed, correlating with metabolic encephalopathy rather than epileptiform activity  Interim History / Subjective:   Critically ill, intubated Remains obtunded with marginal improvement, minimally withdraws to pain, now with occasional non-purposeful eye-opening Intermittently febrile T-max 100.4 F   Objective   Blood pressure 138/60, pulse 74, temperature (!) 100.4 F (38 C), temperature source Axillary, resp. rate (!) 21, height 5\' 4"  (1.626 m), weight 114.7 kg, SpO2 100 %.    Vent Mode: PSV;CPAP FiO2 (%):  [30 %] 30 % Set Rate:  [18 bmp] 18 bmp Vt Set:  [440 mL] 440 mL PEEP:  [5 cmH20] 5 cmH20 Pressure Support:  [10 cmH20] 10 cmH20 Plateau Pressure:  [15 cmH20-18 cmH20] 18 cmH20   Intake/Output Summary (Last 24 hours) at 05/17/2021 0910 Last data filed at 05/17/2021 0900 Gross per 24 hour  Intake 2102.87 ml  Output 1650 ml  Net 452.87 ml    Filed Weights   05/15/21 0500 05/16/21  0500 05/17/21 0412  Weight: 111.7 kg 113.4 kg 114.7 kg    Examination: General: Chronically and critically ill appearing obese older adult F intubated, obtunded, NAD  Eyes: Anicteric sclera, PERRL. Disconjugate gaze Neck: supple, no nuchal rigidity HENT: NCAT redundant neck tissue. ETT secure.  Lungs: CTAb. Symmetrical chest expansion, mechanically ventilated  Cardiovascular: RRR, I4P3, 2/6 systolic murmur at the LUSB Abdomen: Obese soft ndnt Extremities: Left hip externally rotated and left leg length appears short compared to right. No cyanosis or clubbing  Neuro: Unable to follow 1-step commands. Minimal withdrawal to pain only in lower extremities. Occasional non-purposeful eye opening  Labs show improving creatinine, stable mild anemia, no leukocytosis, mild elevation in Mg   Resolved Hospital Problem list     Assessment & Plan:   Acute metabolic encephalopathy Suspect hypoglycemic encephalopathy from prolonged hypoglycemia, prognosis is poor given minimal improvement. -neurology consulted -HSV PCR negative -minimize CNS depressing meds as able -thiamine  Fever Treating for possible aspiration pneumonia. Intermittent low-grade fever, improving. - ampicillin-sulbactam  Acute respiratory failure with hypoxia -full MV support, wean as able -VAP, pulm hygiene -PAD for RASS goal  0 -- sedation currently off.   Left hip external rotation Concerning for hip fracture - DG hip left  Left upper extremity edema - DVT US negative  CKD IV  Elevated CK  Cr improving -trend Cr -Foley -strict I/O   DM2  Hypoglycemia No further hypoglycemia - vsSSI -q4hr CBG   Diarrhea - rectal tube in place - fiber  supplement added to tube feeds  HTN HLD -carvedilol restarted at lower dose -hold other home meds   Best Practice (right click and "Reselect all SmartList Selections" daily)   Diet/type: tubefeeds and NPO DVT prophylaxis: prophylactic heparin  GI prophylaxis:  PPI Lines: N/A Foley:  N/A Code Status:  full code Last date of multidisciplinary goals of care discussion [pending]  Labs   CBC: Recent Labs  Lab 05/13/21 1340 05/13/21 1342 05/13/21 1343 05/13/21 1540 05/15/21 0247 05/16/21 0043 05/17/21 0222  WBC 6.1  --   --   --  10.3 9.1 7.6  NEUTROABS 4.5  --   --   --   --   --  3.8  HGB 13.2   < > 14.3 12.9 11.4* 10.3* 10.2*  HCT 42.7   < > 42.0 38.0 35.8* 32.8* 32.6*  MCV 94.7  --   --   --  93.0 93.4 94.8  PLT 301  --   --   --  232 193 256   < > = values in this interval not displayed.     Basic Metabolic Panel: Recent Labs  Lab 05/13/21 1340 05/13/21 1342 05/13/21 1343 05/13/21 1540 05/13/21 2119 05/14/21 0310 05/15/21 0247 05/16/21 0043 05/17/21 0222  NA 141 140   < > 140  --  134* 135 138 142  K 4.6 4.6   < > 4.8  --  4.8 5.4* 4.2 4.7  CL 103 103  --   --   --  98 100 103 108  CO2 27  --   --   --   --  24 26 24 26   GLUCOSE 70 72  --   --   --  95 169* 166* 162*  BUN 40* 48*  --   --   --  40* 45* 47* 50*  CREATININE 2.26* 2.30*  --   --   --  2.46* 2.61* 2.13* 1.89*  CALCIUM 9.4  --   --   --   --  8.5* 8.3* 8.4* 8.8*  MG  --   --   --   --  2.1 2.1 2.1  --  2.6*  PHOS  --   --   --   --   --   --  4.0  --   --    < > = values in this interval not displayed.    GFR: Estimated Creatinine Clearance: 34.4 mL/min (A) (by C-G formula based on SCr of 1.89 mg/dL (H)). Recent Labs  Lab 05/13/21 1340 05/13/21 1345 05/15/21 0247 05/16/21 0043 05/17/21 0222  WBC 6.1  --  10.3 9.1 7.6  LATICACIDVEN  --  1.1  --   --   --      Liver Function Tests: Recent Labs  Lab 05/13/21 1340 05/17/21 0222  AST 86* 74*  ALT 28 42  ALKPHOS 39 48  BILITOT 0.8 0.5  PROT 6.6 5.6*  ALBUMIN 3.5 2.3*    No results for input(s): LIPASE, AMYLASE in the last 168 hours. Recent Labs  Lab 05/13/21 1349 05/16/21 0910  AMMONIA 35 28     ABG    Component Value Date/Time   PHART 7.401 05/13/2021 1540   PCO2ART 53.0 (H)  05/13/2021 1540   PO2ART 520 (H) 05/13/2021 1540   HCO3 32.9 (H) 05/13/2021 1540   TCO2 34 (H) 05/13/2021 1540   O2SAT 100.0 05/13/2021 1540      Coagulation Profile: Recent Labs  Lab 05/13/21 1340  INR 1.0     Cardiac Enzymes: Recent Labs  Lab 05/13/21 1340 05/14/21 0310  CKTOTAL 2,377* 1,948*     HbA1C: Hgb A1c MFr Bld  Date/Time Value Ref Range Status  09/12/2020 11:52 AM 5.1 4.8 - 5.6 % Final    Comment:             Prediabetes: 5.7 - 6.4          Diabetes: >6.4          Glycemic control for adults with diabetes: <7.0   03/06/2020 12:12 PM 8.3 (H) 4.8 - 5.6 % Final    Comment:             Prediabetes: 5.7 - 6.4          Diabetes: >6.4          Glycemic control for adults with diabetes: <7.0     CBG: Recent Labs  Lab 05/16/21 1151 05/16/21 1520 05/16/21 1928 05/16/21 2349 05/17/21 0346  GLUCAP 150* 144* 145* 149* 134*     Critical care time:      Zola Button, MD PGY-2 [facility] 05/17/2021, 9:10 AM

## 2021-05-17 NOTE — Progress Notes (Signed)
LTM EEG hooked up and running - no initial skin breakdown - push button tested - neuro notified. [facility] monitoring.  

## 2021-05-17 NOTE — TOC Initial Note (Signed)
Transition of Care [facility]) - Initial/Assessment Note    Patient Details  Name: Kimberly Gross MRN: [id number] Date of Birth: 1951/07/07  Transition of Care [facility]) CM/SW Contact:    Tom-Johnson, Renea Ee, RN Phone Number: 05/17/2021, 4:02 PM  Clinical Narrative:                 CM spoke with patient's daughter, Kimberly Gross at bedside. Kimberly Gross states she is patient's care giver as well. Patient is intubated. Patient presents with Altered Mental Status and found to be severe hypoglycemic. Kimberly Gross states she and her sister lives with patient and also four of her grand children lives with her. States patient has two daughters and seven grand children. Does not have and does not use DME's. Retired from [employer].  Family supportive and helps with care prior to hospitalization.Marland Kitchen PCP is [name], MD and [facility] [address]. Has [facility] Medicare. Patient is currently intubated. Medical workup continues. CM will continue to follow with needs.   Barriers to Discharge: Continued Medical Work up   Patient Goals and CMS Choice Patient states their goals for this hospitalization and ongoing recovery are:: To go home CMS Medicare.gov Compare Post Acute Care list provided to:: Patient Represenative (must comment) (Daughter, Kimberly Gross)    Expected Discharge Plan and Services   In-house Referral: Clinical Social Work Discharge Planning Services: CM Consult   Living arrangements for the past 2 months: Single Family Home                                      Prior Living Arrangements/Services Living arrangements for the past 2 months: Single Family Home Lives with:: Adult Children Patient language and need for interpreter reviewed:: Yes Do you feel safe going back to the place where you live?: Yes      Need for Family Participation in Patient Care: Yes (Comment) Care giver support system in place?: Yes (comment)   Criminal Activity/Legal Involvement Pertinent to Current  Situation/Hospitalization: No - Comment as needed  Activities of Daily Living      Permission Sought/Granted Permission sought to share information with : Case Manager, Family Supports Permission granted to share information with : Yes, Verbal Permission Granted              Emotional Assessment Appearance:: Appears stated age Attitude/Demeanor/Rapport: Unable to Assess (Intubated) Affect (typically observed): Unable to Assess (Intubated) Orientation: : Oriented to Self Alcohol / Substance Use: Not Applicable Psych Involvement: No (comment)  Admission diagnosis:  Hypoglycemia [E16.2] Acute respiratory failure with hypoxia (HCC) [J96.01] Altered mental status, unspecified altered mental status type [I77.82] Acute metabolic encephalopathy [U23.53] Patient Active Problem List   Diagnosis Date Noted   Acute encephalopathy    Acute respiratory failure with hypoxia ([place])    Acute metabolic encephalopathy 61/44/3154   CKD (chronic kidney disease) stage 4, GFR 15-29 ml/min ([place]) 09/12/2020   Cough 09/10/2018   Type 2 diabetes mellitus without complication, without long-term current use of insulin ([place]) 05/14/2018   Essential hypertension 05/14/2018   Upper respiratory infection, viral 05/14/2018   Hyperlipidemia 05/05/2018   Morbid (severe) obesity due to excess calories ([place]) 01/22/2017   OSA (obstructive sleep apnea) 01/22/2017   Tremor 05/08/2015   Anxiety state 05/08/2015   Medication-induced postural tremor 05/08/2015   Bipolar 1 disorder, mixed, moderate ([place]) 05/08/2015   PCP:  [name], FNP Pharmacy:   [id number] [place],  [place] - [place] [address] [place] [address] Phone: [phone] Fax: [phone]     Social Determinants of Health (SDOH) Interventions    Readmission Risk Interventions No flowsheet data found.

## 2021-05-17 NOTE — Progress Notes (Signed)
Nutrition Follow-up  DOCUMENTATION CODES:   Not applicable  INTERVENTION:   Continue tube feeding via OG tube: - Vital 1.5 @ 50 ml/hr (1200 ml/day) - ProSource TF 45 ml TID  Tube feeding regimen provides 1920 kcal, 114 grams of protein, and 917 ml of H2O.   - Continue NutriSource fiber BID per tube  NUTRITION DIAGNOSIS:   Inadequate oral intake related to inability to eat as evidenced by NPO status.  Ongoing, being addressed via TF  GOAL:   Patient will meet greater than or equal to 90% of their needs  Met via TF  MONITOR:   Vent status, Labs, Weight trends, TF tolerance, I & O's  REASON FOR ASSESSMENT:   Ventilator, Consult Enteral/tube feeding initiation and management  ASSESSMENT:   70 year old female who presented to the ED on 10/31 with AMS. PMH of CKD stage IV, T2DM, bipolar disorder, HTN, OSA on CPAP. Pt admitted with acute metabolic encephalopathy, acute respiratory failure with hypoxia.  11/01 - s/p LP  Discussed pt with RN and during ICU rounds. Pt remains unresponsive despite correction of hypoglycemia and being off sedation for a few days.  Pt with nutrisource fiber supplement added due to loose stool. Pt with rectal tube in place. Pt tolerating tube feeds well per RN and diarrhea has slowed down with addition of fiber.  Admit weight: 111.7 kg Current weight: 114.7 kg  Patient is currently intubated on ventilator support MV: 6.0 L/min Temp (24hrs), Avg:99.8 F (37.7 C), Min:99.3 F (37.4 C), Max:100.4 F (38 C)   Medications reviewed and include: nutrisource fiber BID, SSI q 4 hours, protonix, IV abx  Labs reviewed: BUN 50, creatinine 1.89, magnesium 2.6 CBG's: 118-149 x 24 hours  UOP: 1300 ml x 24 hours Stool: 350 ml x 24 hours I/O's: +5.6 L since admit  Diet Order:   Diet Order             Diet NPO time specified  Diet effective now                   EDUCATION NEEDS:   Not appropriate for education at this time  Skin:   Skin Assessment: Reviewed RN Assessment  Last BM:  05/17/21 rectal tube  Height:   Ht Readings from Last 1 Encounters:  05/13/21 _0  (1.626 m)    Weight:   Wt Readings from Last 1 Encounters:  05/17/21 114.7 kg    Ideal Body Weight:  54.5 kg  BMI:  Body mass index is 43.4 kg/m.  Estimated Nutritional Needs:   Kcal:  1700-1900  Protein:  110-130 grams  Fluid:  1.7-1.9 L    Gustavus Bryant, MS, RD, LDN Inpatient Clinical Dietitian Please see AMiON for contact information.

## 2021-05-17 NOTE — Progress Notes (Signed)
Neurology Progress Note  S:Patient is unable to participate in ROS due to mental status. Off sedation, has brainstem reflexes except cannot cross midline gaze to L w/ oculocephalics, obtunded, does not follow commands, no movement to noxious stimuli in any extremity.   O: Current vital signs: BP (!) 146/67   Pulse 70   Temp 98.8 F (37.1 C) (Axillary)   Resp 18   Ht $R'5\' 4"'Gp$  (1.626 m)   Wt 114.7 kg   SpO2 100%   BMI 43.40 kg/m  Vital signs in last 24 hours: Temp:  [98.8 F (37.1 C)-100.4 F (38 C)] 98.8 F (37.1 C) (11/04 1519) Pulse Rate:  [69-83] 70 (11/04 1600) Resp:  [15-23] 18 (11/04 1600) BP: (115-156)/(40-81) 146/67 (11/04 1600) SpO2:  [100 %] 100 % (11/04 1600) FiO2 (%):  [30 %] 30 % (11/04 1527) Weight:  [114.7 kg] 114.7 kg (11/04 0412)  GENERAL: Acutely ill appearing obese female on ventilator in ICU.  HEENT: Normocephalic and atraumatic. EEG leads on. ETT in place.  LUNGS: On ventilator.  CV: RRR on tele.  Ext: warm.  NEURO: GCS motor 4      verbal 1       eye 1- total 6.  Mental Status: comatose. Not following commands.  Speech/Language: No sounds.   Cranial Nerves:  II: PERRL. Gaze will not cross midline to L w/ oculocephalics III, IV, VI: Corneal reflex intact.  V, VII: Face is grossly symmetric. Tongue protrudes around ETT. No reaction to brow pressure.   VIII: No response to loud name calling or loud clap.  XI, X:  LX:BWIO is grossly midline.  XII: Tongue protrudes at ETT. No fasciculations.  Motor/Sensation:  No spontaneous or purposeful movements of extremities. No movement to noxious stimuli in any extremity DTRs:  BUE 1     BLE 0  Medications  Current Facility-Administered Medications:    0.9 %  sodium chloride infusion, , Intravenous, PRN, Rigoberto Noel, MD, Last Rate: 10 mL/hr at 05/17/21 1500, Infusion Verify at 05/17/21 1500   acetaminophen (TYLENOL) tablet 650 mg, 650 mg, Per Tube, Q4H PRN, Bowser, Laurel Dimmer, NP, 650 mg at 05/15/21 0749    Ampicillin-Sulbactam (UNASYN) 3 g in sodium chloride 0.9 % 100 mL IVPB, 3 g, Intravenous, Q8H, Jerilynn Birkenhead, RPH, Stopped at 05/17/21 0355   aspirin chewable tablet 81 mg, 81 mg, Per Tube, Daily, [name], MD, 81 mg at 05/17/21 0903   carvedilol (COREG) tablet 6.25 mg, 6.25 mg, Per Tube, BID WC, Kara Mead V, MD, 6.25 mg at 05/17/21 0717   chlorhexidine gluconate (MEDLINE KIT) (PERIDEX) 0.12 % solution 15 mL, 15 mL, Mouth Rinse, BID, Bowser, Grace E, NP, 15 mL at 05/17/21 0709   Chlorhexidine Gluconate Cloth 2 % PADS 6 each, 6 each, Topical, Daily, Chand, Sudham, MD, 6 each at 05/17/21 1015   docusate (COLACE) 50 MG/5ML liquid 100 mg, 100 mg, Per Tube, BID PRN, Henri Medal, RPH   feeding supplement (PROSource TF) liquid 45 mL, 45 mL, Per Tube, TID, Rigoberto Noel, MD, 45 mL at 05/17/21 1541   feeding supplement (VITAL 1.5 CAL) liquid 1,000 mL, 1,000 mL, Per Tube, Continuous, Rigoberto Noel, MD, Last Rate: 50 mL/hr at 05/16/21 1802, 1,000 mL at 05/16/21 1802   fiber (NUTRISOURCE FIBER) 1 packet, 1 packet, Per Tube, BID, Zola Button, MD, 1 packet at 05/17/21 0904   heparin injection 5,000 Units, 5,000 Units, Subcutaneous, Q8H, Bowser, Laurel Dimmer, NP, 5,000 Units at 05/17/21 1343  insulin aspart (novoLOG) injection 0-6 Units, 0-6 Units, Subcutaneous, Q4H, Kara Mead V, MD, 1 Units at 05/15/21 2050   lactated ringers infusion, , Intravenous, Continuous, Bowser, Laurel Dimmer, NP   levETIRAcetam (KEPPRA) IVPB 500 mg/100 mL premix, 500 mg, Intravenous, Q12H, Amie Portland, MD, Stopped at 05/17/21 [phone]   MEDLINE mouth rinse, 15 mL, Mouth Rinse, 10 times per day, Bowser, Laurel Dimmer, NP, 15 mL at 05/17/21 1543   ondansetron (ZOFRAN) injection 4 mg, 4 mg, Intravenous, Q6H PRN, Bowser, Laurel Dimmer, NP   pantoprazole sodium (PROTONIX) 40 mg/20 mL oral suspension 40 mg, 40 mg, Per Tube, QHS, Jerilynn Birkenhead, RPH, 40 mg at 05/16/21 2150   polyethylene glycol (MIRALAX / GLYCOLAX) packet 17 g, 17 g, Per Tube,  Daily PRN, Bowser, Laurel Dimmer, NP  Pertinent Labs Creat 2.13 (2.61) CK pending today (1948).   No new imaging.   rEEG 05/14/21 per Dr. Quinn Axe.  There were 8 electrographic seizures during this 42 minute recording that ranged in duration from 30 seconds to 4 minutes. Each seizure begins with very low amplitude fast activity in the left frontotemporal leads and spreading in <1 second to the right frontotemporal leads after which the activity builds up into spike wave activity at 1 Hz, generalized but best seen over the left temporal region, until it abates and is followed by brief relative suppression. Accompanying video for multiple events reviewed and patient appeared to be either symptomatic or was seen to tense his shoulders. No button pushes throughout recording.  Impression and clinical correlation: This EEG was obtained while awake and drowsy and is abnormal due to: - Frequent subclinical electrographic seizures likely originating in the left temporal region and frequently generalizing (seizure activity present in estimated >50% of recording). - Moderate diffuse slowing  LTM ABNORMALITY - Periodic discharges with triphasic morphology, generalized ( GPDs) - Continuous slow, generalized   IMPRESSION: This study showed generalized periodic discharges (GPDs) with triphasic morphology at $RemoveBefor'1Hz'rVCqpRCOICWo$  which at times appear rhythmic . This is on the ictal-interictal continuum with moderate potential for seizures. There is also moderate diffuse encephalopathy, nonspecific etiology but could be secondary to toxic-metabolic causes. No definite seizures were seen throughout the recording.  VPA level <10 Ammonia 28   Assessment: 70 y.o. female who presented to the ED 10/31 for evaluation of unresponsiveness with hypoglycemia (CBG in the 30's PTA) without improvement in mental status following correction of hypoglycemia. T max 100 F axillary. Preliminary LP results unremarkable. Gram stain negative thus far. HSV  is still pending. Acyclovir on board. Initial rEEG c/f electrographic seizures but subsequent LTM was felt to only represent tripahsics and was d/c'd yesterday. 2/2 persistent severe encephalopathy and inability to cross midline gaze to L with oculocephalics will hook her back up to cEEG  Impression: -comatose.  -prolonged hypoglycemic state, resolved -fevers.  -seizure like activity.   Recommendations/Plan:  -Continue Keppra $RemoveBeforeDE'500mg'aSGVuPxcdqAhlEs$  IV q12 hrs.  - Rehook LTM EEG -Will need to call and discuss findings/EEG with daughter.  -Continue to treat metabolic and infectious derangements as you are doing.  -Continue Acyclovir until HSV results negative.  -Neurology will continue to follow.   This patient is critically ill and at significant risk of neurological worsening, death and care requires constant monitoring of vital signs, hemodynamics,respiratory and cardiac monitoring, neurological assessment, discussion with family, other specialists and medical decision making of high complexity. I spent 40 minutes of neurocritical care time  in the care of  this patient. This was time spent independent of  any time provided by nurse practitioner or PA.  Su Monks, MD [facility] [phone]  If 7pm- 7am, please page neurology on call as listed in [place].

## 2021-05-18 DIAGNOSIS — J9601 Acute respiratory failure with hypoxia: Secondary | ICD-10-CM | POA: Diagnosis not present

## 2021-05-18 DIAGNOSIS — G9341 Metabolic encephalopathy: Secondary | ICD-10-CM | POA: Diagnosis not present

## 2021-05-18 DIAGNOSIS — G9340 Encephalopathy, unspecified: Secondary | ICD-10-CM | POA: Diagnosis not present

## 2021-05-18 LAB — CSF CULTURE W GRAM STAIN: Culture: NO GROWTH

## 2021-05-18 LAB — CULTURE, BLOOD (ROUTINE X 2)
Culture: NO GROWTH
Culture: NO GROWTH

## 2021-05-18 LAB — GLUCOSE, CAPILLARY
Glucose-Capillary: 147 mg/dL — ABNORMAL HIGH (ref 70–99)
Glucose-Capillary: 147 mg/dL — ABNORMAL HIGH (ref 70–99)
Glucose-Capillary: 152 mg/dL — ABNORMAL HIGH (ref 70–99)
Glucose-Capillary: 163 mg/dL — ABNORMAL HIGH (ref 70–99)
Glucose-Capillary: 164 mg/dL — ABNORMAL HIGH (ref 70–99)
Glucose-Capillary: 167 mg/dL — ABNORMAL HIGH (ref 70–99)

## 2021-05-18 LAB — T4, FREE: Free T4: 1.05 ng/dL (ref 0.61–1.12)

## 2021-05-18 LAB — TSH: TSH: 1.562 u[IU]/mL (ref 0.350–4.500)

## 2021-05-18 MED ORDER — CARVEDILOL 25 MG PO TABS
25.0000 mg | ORAL_TABLET | Freq: Two times a day (BID) | ORAL | Status: DC
  Administered 2021-05-18 – 2021-06-13 (×50): 25 mg
  Filled 2021-05-18 (×51): qty 1

## 2021-05-18 MED ORDER — ATORVASTATIN CALCIUM 40 MG PO TABS
40.0000 mg | ORAL_TABLET | Freq: Every day | ORAL | Status: DC
  Administered 2021-05-18 – 2021-06-08 (×21): 40 mg
  Filled 2021-05-18 (×21): qty 1

## 2021-05-18 MED ORDER — FUROSEMIDE 10 MG/ML IJ SOLN
20.0000 mg | Freq: Once | INTRAMUSCULAR | Status: AC
  Administered 2021-05-18: 20 mg via INTRAVENOUS
  Filled 2021-05-18: qty 2

## 2021-05-18 NOTE — Progress Notes (Signed)
NAME:  Kimberly Gross, MRN:  [id number], DOB:  July 16, 1950, LOS: 5 ADMISSION DATE:  05/13/2021, CONSULTATION DATE:  05/13/21 REFERRING MD:  Ronnald Nian - EM, CHIEF COMPLAINT:  AMS    History of Present Illness:  70 yo F PMH CKD IV, DM, bipolar disorder, HTN, Osa on CPAP who was found obtunded in bed by family member 10/31 afternoon found to be severely hypoglycemic which improved with glucagon but no improvement in mental status. Intubated for GCS 3. CT head unremarkable, CBC and CMP also unremarkable.  Pertinent  Medical History  CKD IV Bipolar Disorder DM2  HLD Anxiety HTN OSA on CPAP  Significant Hospital Events: Including procedures, antibiotic start and stop dates in addition to other pertinent events   10/31 Obtunded and hypoglycemic for EMS, mentation did not improve with glucagon. Intubated in ED, CT H non acute. Admitting to PCCM  11/1 LP performed by IR, CSF clear with no organisms on gram stain 11/2 routine EEG read as seizure activity, loaded with levetiracetam 11/3 LTM EEG reviewed, correlating with metabolic encephalopathy rather than epileptiform activity 11/5 no acute events overnight, remains on continuous EEG with no signs of seizures  Interim History / Subjective:  Remains sedated on ventilator No response to verbal or physical stimuli Afebrile  Objective   Blood pressure (!) 167/69, pulse 71, temperature 99.2 F (37.3 C), temperature source Axillary, resp. rate 18, height 5\' 4"  (1.626 m), weight 114.7 kg, SpO2 100 %.    Vent Mode: PRVC FiO2 (%):  [30 %] 30 % Set Rate:  [18 bmp] 18 bmp Vt Set:  [440 mL] 440 mL PEEP:  [5 cmH20] 5 cmH20 Pressure Support:  [10 cmH20] 10 cmH20 Plateau Pressure:  [18 cmH20-19 cmH20] 19 cmH20   Intake/Output Summary (Last 24 hours) at 05/18/2021 0716 Last data filed at 05/18/2021 0400 Gross per 24 hour  Intake 1606.61 ml  Output 750 ml  Net 856.61 ml    Filed Weights   05/15/21 0500 05/16/21 0500 05/17/21 0412  Weight:  111.7 kg 113.4 kg 114.7 kg    Examination: General: Acute on chronic ill-appearing elderly female lying in bed in no acute distress HEENT: ETT, MM pink/moist, PERRL,  Neuro: No response to pain or verbal stimuli this a.m. CV: s1s2 regular rate and rhythm, no murmur, rubs, or gallops,  PULM: Clear to auscultation bilaterally, no increased work of breathing, no added breath sounds GI: soft, bowel sounds active in all 4 quadrants, non-tender, non-distended, tolerating TF Extremities: warm/dry, generalized nonpitting edema  Skin: no rashes or lesions  Resolved Hospital Problem list     Assessment & Plan:   Acute metabolic encephalopathy -Suspect hypoglycemic encephalopathy from prolonged hypoglycemia, prognosis is poor given minimal improvement. -HSV PCR negative P: Neuro consulted, appreciate assistance Maintain neuro protective measures; goal for eurothermia, euglycemia, eunatermia, normoxia, and PCO2 goal of 35-40 Nutrition and bowel regiment  Seizure precautions  AEDs per neurology  Aspirations precautions  Continues to require no sedation while on ventilator Supplemental thiamine Continuous EEG per neurology  Fever -Treating for possible aspiration pneumonia. Intermittent low-grade fever, improving. P: Continue empiric Unasyn Follow cultures VAP bundle in place  Acute respiratory failure with hypoxia -In the setting of significantly reduced mentation requiring intubation for airway support P: Continue ventilator support with lung protective strategies  Mentation continues to preclude extubation Wean PEEP and FiO2 for sats greater than 90%. Head of bed elevated 30 degrees. Plateau pressures less than 30 cm H20.  Follow intermittent chest x-ray and ABG.  Ensure adequate pulmonary hygiene  Follow cultures  VAP bundle in place  PAD protocol RASS goal 0  Left hip external rotation -Concerning for hip fracture P: Left hip x-ray negative for acute fracture  Left  upper extremity edema P: Did for DVT on Doppler bilaterally  CKD IV  Elevated CK  -Cr improving P: Follow renal function  Monitor urine output Trend Bmet Avoid nephrotoxins Ensure adequate renal perfusion  External urinary device in place   DM2  Hypoglycemia -No further hypoglycemia P: Continue SSI CBG checks every 4 hours Continue tube feeds  Diarrhea P: Rectal tube remains in place  HTN HLD -Home medications include Lasix, Norvasc, aspirin, Lipitor, carvedilol, hydralazine P: Use carvedilol to home dose Continue to hold home Norvasc and hydralazine Continue home aspirin and statin  Best Practice (right click and "Reselect all SmartList Selections" daily)   Diet/type: tubefeeds and NPO DVT prophylaxis: prophylactic heparin  GI prophylaxis: PPI Lines: N/A Foley:  N/A Code Status:  full code Last date of multidisciplinary goals of care discussion [pending]  Critical care time:   CRITICAL CARE Performed by: Iza Preston D. Harris  Total critical care time: 40 minutes  Critical care time was exclusive of separately billable procedures and treating other patients.  Critical care was necessary to treat or prevent imminent or life-threatening deterioration.  Critical care was time spent personally by me on the following activities: development of treatment plan with patient and/or surrogate as well as nursing, discussions with consultants, evaluation of patient's response to treatment, examination of patient, obtaining history from patient or surrogate, ordering and performing treatments and interventions, ordering and review of laboratory studies, ordering and review of radiographic studies, pulse oximetry and re-evaluation of patient's condition.  Tiaja Hagan D. Kenton Kingfisher, NP-C [facility] Personal contact information can be found on Amion  05/18/2021, 7:19 AM

## 2021-05-18 NOTE — Procedures (Addendum)
Patient Name: ADELEI SCOBEY  MRN: [id number]  Epilepsy Attending: Lora Havens  Referring Physician/Provider: Dr Amie Portland Duration: 05/17/2021 1706 to 05/18/2021 1004   Patient history: 70 y.o. female with a history of encephalopathy who is undergoing an EEG to evaluate for seizures.   Level of alertness:  lethargic   AEDs during EEG study: LEV   Technical aspects: This EEG study was done with scalp electrodes positioned according to the 10-20 International system of electrode placement. Electrical activity was acquired at a sampling rate of 500Hz  and reviewed with a high frequency filter of 70Hz  and a low frequency filter of 1Hz . EEG data were recorded continuously and digitally stored.    Description: EEG showed predominantly 5-7hz  theta slowing as well as intermittent generalized 2-3hz  delta slowing. Hyperventilation and photic stimulation were not performed.      ABNORMALITY - Continuous slow, generalized   IMPRESSION: This study is suggestive of moderate diffuse encephalopathy, nonspecific etiology. No seizures or epileptiform discharges were seen throughout the recording.   Lemmie Steinhaus Barbra Sarks

## 2021-05-18 NOTE — Progress Notes (Signed)
LTM maint complete - no Skin breakdown Fp1 Fp2 F3 [facility] monitored, Event button test confirmed by [facility].

## 2021-05-18 NOTE — Progress Notes (Signed)
Neurology Progress Note  S: No acute changes overnight  O: Current vital signs: BP (!) 146/67   Pulse 70   Temp 98.8 F (37.1 C) (Axillary)   Resp 18   Ht _0  (1.626 m)   Wt 114.7 kg   SpO2 100%   BMI 43.40 kg/m  Vital signs in last 24 hours: Temp:  [98.8 F (37.1 C)-100.4 F (38 C)] 98.8 F (37.1 C) (11/04 1519) Pulse Rate:  [69-83] 70 (11/04 1600) Resp:  [15-23] 18 (11/04 1600) BP: (115-156)/(40-81) 146/67 (11/04 1600) SpO2:  [100 %] 100 % (11/04 1600) FiO2 (%):  [30 %] 30 % (11/04 1527) Weight:  [114.7 kg] 114.7 kg (11/04 0412)  General: No sedation, obtunded, intubated HEENT: Normocephalic/atraumatic Lungs: Vented Abdomen obese, nontender CVS: Regular rate rhythm Neurological exam She is intubated, no sedation She does not open eyes to voice but to noxious stimulation she did have eye-opening today. She has disconjugate gaze with some disorganized nystagmus but no gaze preference or deviation.  Does not have the gaze preference on oculocephalics noted yesterday. Pupils are equal round react light, corneal reflexes are present. Does not blink to threat from either side Facial symmetry difficult ascertain To noxious stimulation, she only opens eyes but does not withdraw or localize.  Medications  Current Facility-Administered Medications:    0.9 %  sodium chloride infusion, , Intravenous, PRN, Rigoberto Noel, MD, Last Rate: 10 mL/hr at 05/17/21 1500, Infusion Verify at 05/17/21 1500   acetaminophen (TYLENOL) tablet 650 mg, 650 mg, Per Tube, Q4H PRN, Bowser, Laurel Dimmer, NP, 650 mg at 05/15/21 0749   Ampicillin-Sulbactam (UNASYN) 3 g in sodium chloride 0.9 % 100 mL IVPB, 3 g, Intravenous, Q8H, Jerilynn Birkenhead, RPH, Stopped at 05/17/21 3354   aspirin chewable tablet 81 mg, 81 mg, Per Tube, Daily, [name], MD, 81 mg at 05/17/21 0903   carvedilol (COREG) tablet 6.25 mg, 6.25 mg, Per Tube, BID WC, Kara Mead V, MD, 6.25 mg at 05/17/21 0717   chlorhexidine  gluconate (MEDLINE KIT) (PERIDEX) 0.12 % solution 15 mL, 15 mL, Mouth Rinse, BID, Bowser, Grace E, NP, 15 mL at 05/17/21 0709   Chlorhexidine Gluconate Cloth 2 % PADS 6 each, 6 each, Topical, Daily, Chand, Sudham, MD, 6 each at 05/17/21 1015   docusate (COLACE) 50 MG/5ML liquid 100 mg, 100 mg, Per Tube, BID PRN, Henri Medal, RPH   feeding supplement (PROSource TF) liquid 45 mL, 45 mL, Per Tube, TID, Rigoberto Noel, MD, 45 mL at 05/17/21 1541   feeding supplement (VITAL 1.5 CAL) liquid 1,000 mL, 1,000 mL, Per Tube, Continuous, Rigoberto Noel, MD, Last Rate: 50 mL/hr at 05/16/21 1802, 1,000 mL at 05/16/21 1802   fiber (NUTRISOURCE FIBER) 1 packet, 1 packet, Per Tube, BID, Zola Button, MD, 1 packet at 05/17/21 0904   heparin injection 5,000 Units, 5,000 Units, Subcutaneous, Q8H, Bowser, Grace E, NP, 5,000 Units at 05/17/21 1343   insulin aspart (novoLOG) injection 0-6 Units, 0-6 Units, Subcutaneous, Q4H, Rigoberto Noel, MD, 1 Units at 05/15/21 2050   lactated ringers infusion, , Intravenous, Continuous, Bowser, Laurel Dimmer, NP   levETIRAcetam (KEPPRA) IVPB 500 mg/100 mL premix, 500 mg, Intravenous, Q12H, Amie Portland, MD, Stopped at 05/17/21 [phone]   MEDLINE mouth rinse, 15 mL, Mouth Rinse, 10 times per day, Bowser, Shirlee Limerick E, NP, 15 mL at 05/17/21 1543   ondansetron (ZOFRAN) injection 4 mg, 4 mg, Intravenous, Q6H PRN, Bowser, Laurel Dimmer, NP   pantoprazole sodium (PROTONIX)  40 mg/20 mL oral suspension 40 mg, 40 mg, Per Tube, QHS, Jerilynn Birkenhead, RPH, 40 mg at 05/16/21 2150   polyethylene glycol (MIRALAX / GLYCOLAX) packet 17 g, 17 g, Per Tube, Daily PRN, Cristal Generous, NP   CBC    Component Value Date/Time   WBC 7.6 05/17/2021 0222   RBC 3.44 (L) 05/17/2021 0222   HGB 10.2 (L) 05/17/2021 0222   HGB 11.7 09/12/2020 1152   HCT 32.6 (L) 05/17/2021 0222   HCT 36.9 09/12/2020 1152   PLT 256 05/17/2021 0222   PLT 298 09/12/2020 1152   MCV 94.8 05/17/2021 0222   MCV 91 09/12/2020 1152   MCH 29.7  05/17/2021 0222   MCHC 31.3 05/17/2021 0222   RDW 12.3 05/17/2021 0222   RDW 11.8 09/12/2020 1152   LYMPHSABS 2.3 05/17/2021 0222   MONOABS 1.0 05/17/2021 0222   EOSABS 0.5 05/17/2021 0222   BASOSABS 0.1 05/17/2021 0222   BMP Latest Ref Rng & Units 05/17/2021 05/16/2021 05/15/2021  Glucose 70 - 99 mg/dL 162(H) 166(H) 169(H)  BUN 8 - 23 mg/dL 50(H) 47(H) 45(H)  Creatinine 0.44 - 1.00 mg/dL 1.89(H) 2.13(H) 2.61(H)  BUN/Creat Ratio 12 - 28 - - -  Sodium 135 - 145 mmol/L 142 138 135  Potassium 3.5 - 5.1 mmol/L 4.7 4.2 5.4(H)  Chloride 98 - 111 mmol/L 108 103 100  CO2 22 - 32 mmol/L _0 Calcium 8.9 - 10.3 mg/dL 8.8(L) 8.4(L) 8.3(L)      No new imaging.   Overnight EEG-moderate diffuse encephalopathy of nonspecific etiology.  No seizures or epileptiform discharges.   Assessment: 70 y.o. female who presented to the ED 10/31 for evaluation of unresponsiveness with hypoglycemia (CBG in the 30's PTA) without improvement in mental status following correction of hypoglycemia. T max 100 F axillary. Preliminary LP results unremarkable. Gram stain negative thus far. HSV PCR is negative-acyclovir can be discontinued. . Initial rEEG c/f electrographic seizures but subsequent LTM was felt to only represent tripahsics and was d/c'd 2 days ago. 2/2 persistent severe encephalopathy and inability to cross midline gaze to L with oculocephalics yesterday, hooked up to continuous EEG again.  No seizures or epileptiform activity seen.  Her exam has been pretty poor over the past few days but today with noxious stimulation she had eye opening.  Suspect poor brain reserve, prolonged hypoglycemia causing severe encephalopathy.  We will continue to reassess for improvement.  Likely prolonged hypoglycemic state leading to prolonged encephalopathy  Recommendations/Plan:  -Continue Keppra 52m IV q12 hrs.  -Discontinue LTM EEG -Discontinue acyclovir -Medical management per primary team -Spoke with the  daughter over the phone, explained that there is very minimal improvement-eye-opening to noxious stimulation today but otherwise she still remains extremely encephalopathic.  Prolonged hypoglycemia and encephalopathy may have caused irreversible damage but only further course of time will tell how she is doing.  I will examine her again tomorrow to see if there is any improvement. -Neurology will continue to follow.   Discussed with Dr. DCharlotte Crumb MD Neurologist [facility] Pager: [phone]  CRITICAL CARE ATTESTATION Performed by: AAmie Portland MD Total critical care time: 31 minutes Critical care time was exclusive of separately billable procedures and treating other patients and/or supervising APPs/Residents/Students Critical care was necessary to treat or prevent imminent or life-threatening deterioration due to toxic metabolic encephalopathy This patient is critically ill and at significant risk for neurological worsening and/or death and care requires constant monitoring. Critical care was  time spent personally by me on the following activities: development of treatment plan with patient and/or surrogate as well as nursing, discussions with consultants, evaluation of patient's response to treatment, examination of patient, obtaining history from patient or surrogate, ordering and performing treatments and interventions, ordering and review of laboratory studies, ordering and review of radiographic studies, pulse oximetry, re-evaluation of patient's condition, participation in multidisciplinary rounds and medical decision making of high complexity in the care of this patient.

## 2021-05-18 NOTE — Progress Notes (Signed)
LTM dced. No skin breakdown seen. Results pending.

## 2021-05-19 ENCOUNTER — Inpatient Hospital Stay (HOSPITAL_COMMUNITY): Payer: Medicare (Managed Care)

## 2021-05-19 DIAGNOSIS — J9601 Acute respiratory failure with hypoxia: Secondary | ICD-10-CM | POA: Diagnosis not present

## 2021-05-19 DIAGNOSIS — E1122 Type 2 diabetes mellitus with diabetic chronic kidney disease: Secondary | ICD-10-CM

## 2021-05-19 DIAGNOSIS — E162 Hypoglycemia, unspecified: Secondary | ICD-10-CM | POA: Diagnosis not present

## 2021-05-19 DIAGNOSIS — G9341 Metabolic encephalopathy: Secondary | ICD-10-CM | POA: Diagnosis not present

## 2021-05-19 DIAGNOSIS — I129 Hypertensive chronic kidney disease with stage 1 through stage 4 chronic kidney disease, or unspecified chronic kidney disease: Secondary | ICD-10-CM

## 2021-05-19 DIAGNOSIS — N183 Chronic kidney disease, stage 3 unspecified: Secondary | ICD-10-CM

## 2021-05-19 LAB — BASIC METABOLIC PANEL
Anion gap: 8 (ref 5–15)
BUN: 58 mg/dL — ABNORMAL HIGH (ref 8–23)
CO2: 24 mmol/L (ref 22–32)
Calcium: 9 mg/dL (ref 8.9–10.3)
Chloride: 112 mmol/L — ABNORMAL HIGH (ref 98–111)
Creatinine, Ser: 1.74 mg/dL — ABNORMAL HIGH (ref 0.44–1.00)
GFR, Estimated: 31 mL/min — ABNORMAL LOW (ref >60)
Glucose, Bld: 177 mg/dL — ABNORMAL HIGH (ref 70–99)
Potassium: 5 mmol/L (ref 3.5–5.1)
Sodium: 144 mmol/L (ref 135–145)

## 2021-05-19 LAB — CBC
HCT: 31.9 % — ABNORMAL LOW (ref 36.0–46.0)
Hemoglobin: 9.5 g/dL — ABNORMAL LOW (ref 12.0–15.0)
MCH: 28.9 pg (ref 26.0–34.0)
MCHC: 29.8 g/dL — ABNORMAL LOW (ref 30.0–36.0)
MCV: 97 fL (ref 80.0–100.0)
Platelets: 327 10*3/uL (ref 150–400)
RBC: 3.29 MIL/uL — ABNORMAL LOW (ref 3.87–5.11)
RDW: 12.4 % (ref 11.5–15.5)
WBC: 6.7 10*3/uL (ref 4.0–10.5)
nRBC: 0 % (ref 0.0–0.2)

## 2021-05-19 LAB — GLUCOSE, CAPILLARY
Glucose-Capillary: 154 mg/dL — ABNORMAL HIGH (ref 70–99)
Glucose-Capillary: 161 mg/dL — ABNORMAL HIGH (ref 70–99)
Glucose-Capillary: 143 mg/dL — ABNORMAL HIGH (ref 70–99)
Glucose-Capillary: 171 mg/dL — ABNORMAL HIGH (ref 70–99)
Glucose-Capillary: 189 mg/dL — ABNORMAL HIGH (ref 70–99)
Glucose-Capillary: 187 mg/dL — ABNORMAL HIGH (ref 70–99)

## 2021-05-19 IMAGING — MR MR HEAD W/O CM
12 of 13 series · 44 of 48 positions shown · non-contrast
Comparison: Brain MRI without contrast [DATE] and earlier.

CLINICAL DATA: 70-year-old female with seizure, poor mental status.
Initially presented with significant hypoglycemia and altered mental
status.

EXAM:
MRI HEAD WITHOUT CONTRAST
TECHNIQUE: Multiplanar, multiecho pulse sequences of the brain and surrounding
structures were obtained without intravenous contrast.

[Series 5: DWI · axial · 3.0mm · 0.88mm/px · z∈[-80,+60]mm · 9 of 96 slices shown (1 of 4)]
[im 1/96]
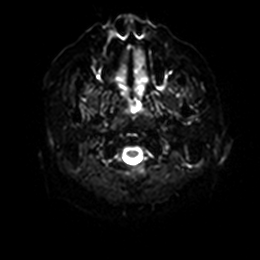
[im 12/96]
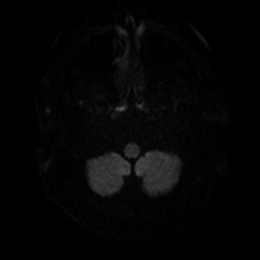
[im 24/96]
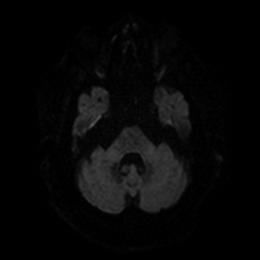
[im 36/96]
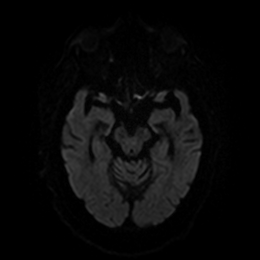
[im 48/96]
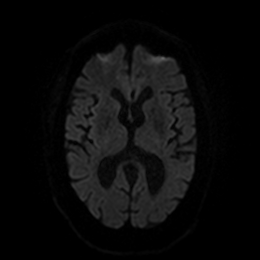
[im 60/96]
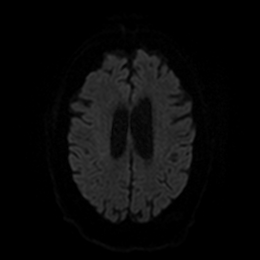
[im 72/96]
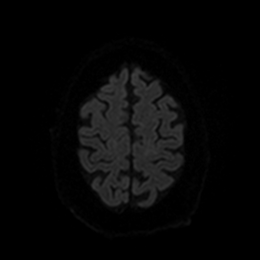
[im 84/96]
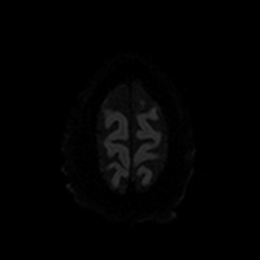
[im 96/96]
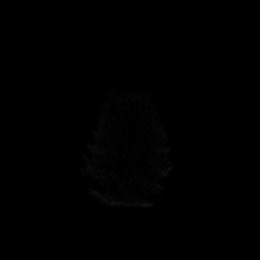

[Series 6: DWI · axial · 3.0mm · 0.88mm/px · z∈[-80,+60]mm · 4 of 48 slices shown (2 of 4)]
[im 1/48]
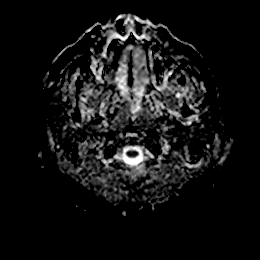
[im 16/48]
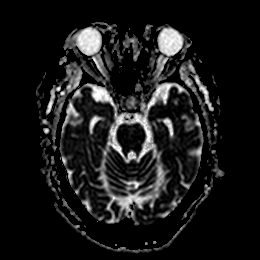
[im 32/48]
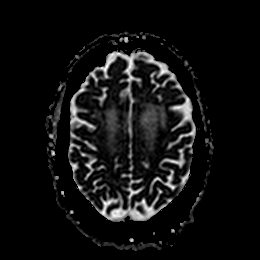
[im 48/48]
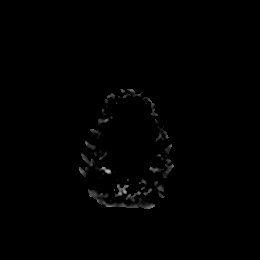

[Series 7: DWI · coronal · 4.0mm · 0.88mm/px · 5 of 64 slices shown (3 of 4)]
[im 1/64]
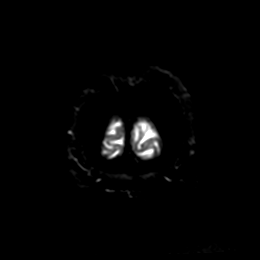
[im 16/64]
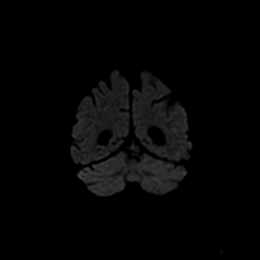
[im 32/64]
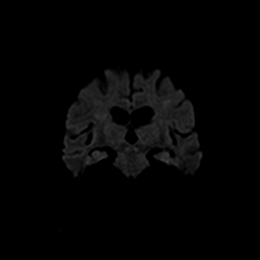
[im 48/64]
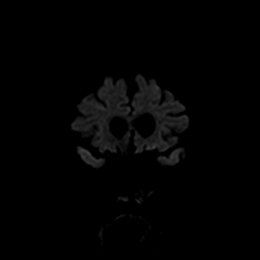
[im 64/64]
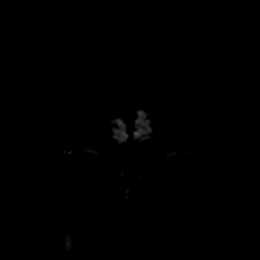

[Series 8: DWI · coronal · 4.0mm · 0.88mm/px · 3 of 32 slices shown (4 of 4)]
[im 1/32]
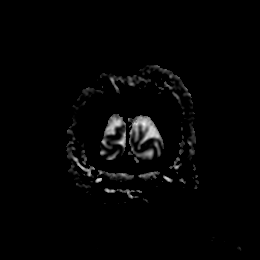
[im 16/32]
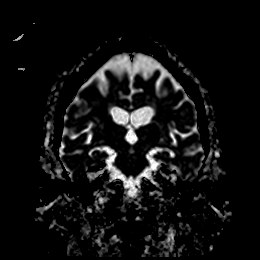
[im 32/32]
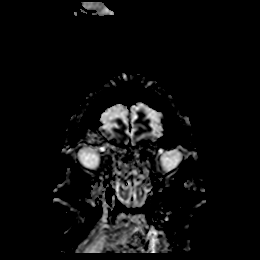

[Series 9: T1 · sagittal · 5.0mm · 0.75mm/px · 2 of 22 slices shown]
[im 1/22]
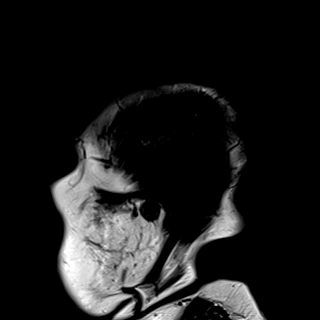
[im 22/22]
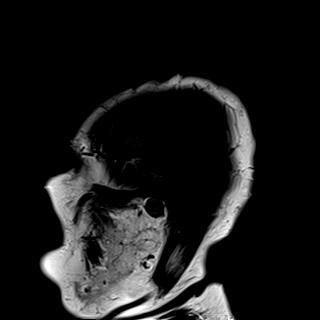

[Series 10: T2 · axial · 5.0mm · 0.72mm/px · z∈[-79,+58]mm · 2 of 24 slices shown (1 of 2)]
[im 1/24]
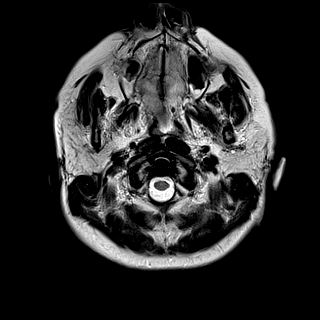
[im 24/24]
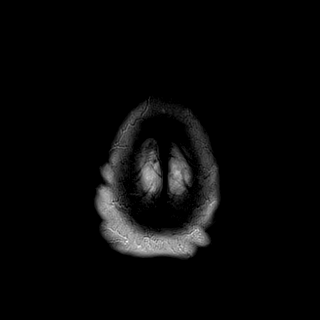

[Series 11: FLAIR · axial · 5.0mm · 0.45mm/px · z∈[-81,+57]mm · 2 of 24 slices shown]
[im 1/24]
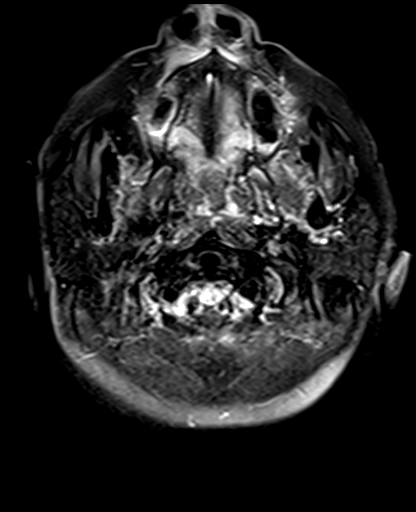
[im 24/24]
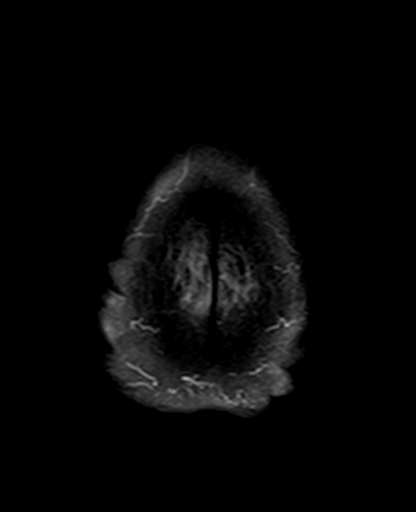

[Series 12: mag_images · axial · 3.0mm · 0.90mm/px · z∈[-82,+59]mm · 4 of 48 slices shown]
[im 1/48]
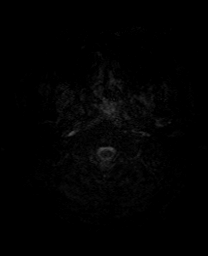
[im 16/48]
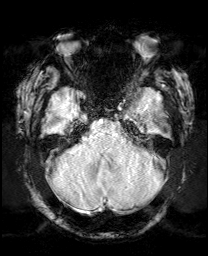
[im 32/48]
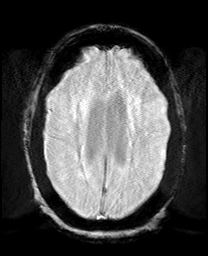
[im 48/48]
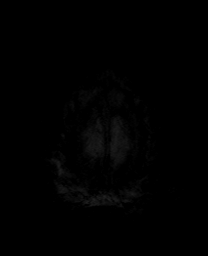

[Series 13: pha_images · axial · 3.0mm · 0.90mm/px · z∈[-79,+59]mm · 4 of 47 slices shown]
[im 1/47]
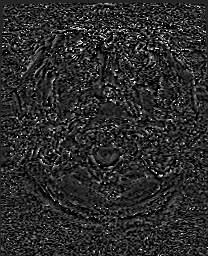
[im 16/47]
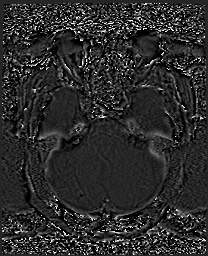
[im 31/47]
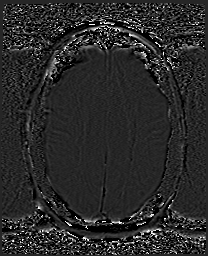
[im 47/47]
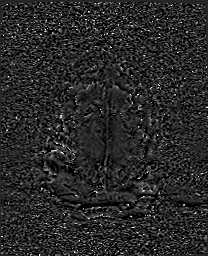

[Series 14: swi_images · axial · 3.0mm · 0.90mm/px · z∈[-82,+59]mm · 4 of 48 slices shown]
[im 1/48]
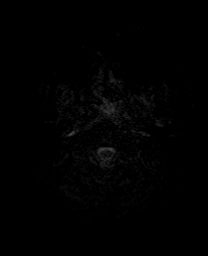
[im 16/48]
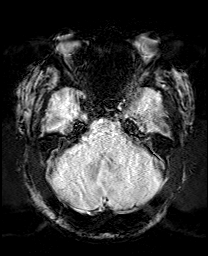
[im 32/48]
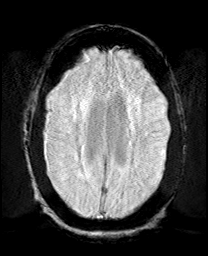
[im 48/48]
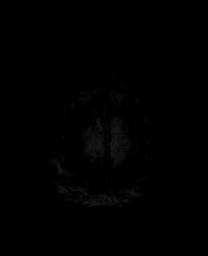

[Series 15: mip_images(sw) · axial · 24.0mm · 0.90mm/px · z∈[-72,+48]mm · 3 of 41 slices shown]
[im 1/41]
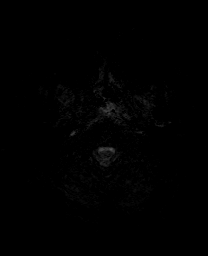
[im 21/41]
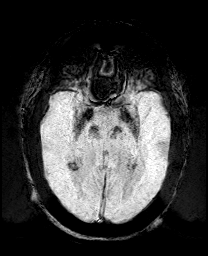
[im 41/41]
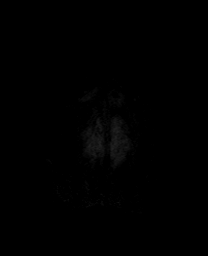

[Series 17: T2 · coronal · 5.0mm · 0.34mm/px · 2 of 28 slices shown (2 of 2)]
[im 1/28]
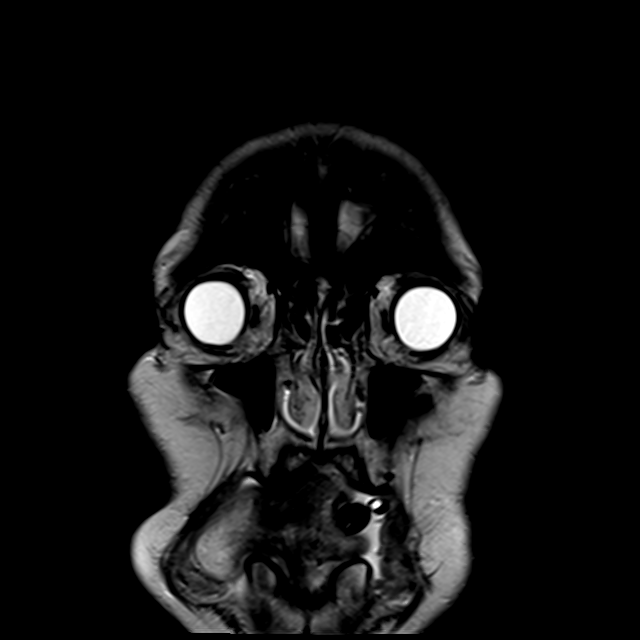
[im 28/28]
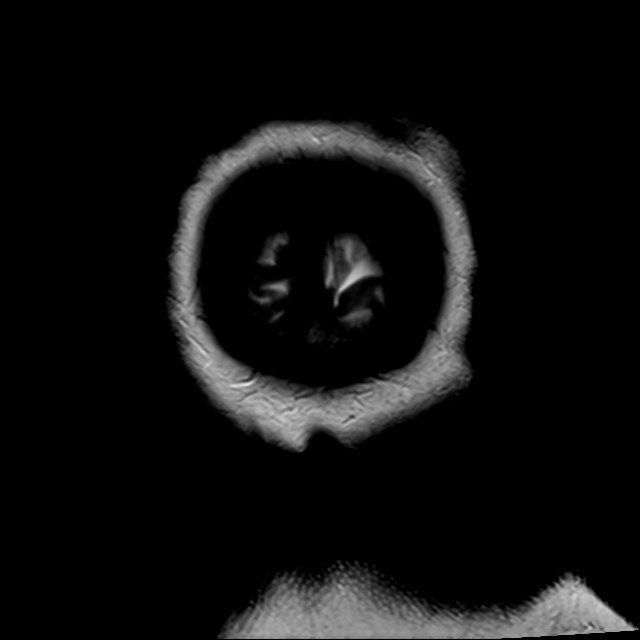

[44 of 48 positions shown; findings below may reference images not displayed]

FINDINGS: Brain: No restricted diffusion to suggest acute infarction. No
midline shift, mass effect, evidence of mass lesion,
ventriculomegaly, extra-axial collection or acute intracranial
hemorrhage. Cervicomedullary junction and pituitary are within
normal limits.

No definite hippocampal edema or atrophy. Stable gray-white matter
differentiation throughout the brain. Patchy and confluent bilateral
cerebral white matter T2 and FLAIR hyperintensity in a nonspecific
configuration. Mild bilateral basal ganglia heterogeneity. Thalami,
brainstem, and cerebellum remain within normal limits. No cortical
encephalomalacia or chronic cerebral blood products identified.

Vascular: Major intracranial vascular flow voids are stable.

Skull and upper cervical spine: Negative visible cervical spine.
Visualized bone marrow signal is within normal limits.

Sinuses/Orbits: Negative orbits. Increased bilateral paranasal sinus
mucosal thickening, mild-to-moderate.

Other: Intubated. Fluid in the pharynx. Small left nasopharyngeal
retention cysts suspected on series 10, image 3 (inconsequential).
Trace mastoid air cell fluid now on the right.
IMPRESSION: 1.  No acute intracranial abnormality identified.
2. Stable MRI appearance of the brain since [DATE] with
moderately advanced nonspecific signal changes in the cerebral white
matter, mildly involving the bilateral basal ganglia.

## 2021-05-19 MED ORDER — FUROSEMIDE 10 MG/ML IJ SOLN
20.0000 mg | Freq: Once | INTRAMUSCULAR | Status: AC
  Administered 2021-05-19: 20 mg via INTRAVENOUS
  Filled 2021-05-19: qty 2

## 2021-05-19 MED ORDER — AMLODIPINE BESYLATE 10 MG PO TABS
10.0000 mg | ORAL_TABLET | Freq: Every day | ORAL | Status: DC
  Administered 2021-05-19 – 2021-06-13 (×26): 10 mg
  Filled 2021-05-19 (×26): qty 1

## 2021-05-19 NOTE — Progress Notes (Signed)
Neurology Progress Note  Subjective: No acute changes overnight LTM discontinued 11/5  Exam: Vitals:   05/19/21 0753 05/19/21 0800  BP:  (!) 151/60  Pulse:  71  Resp:  18  Temp:  98.8 F (37.1 C)  SpO2: 100% 100%   Gen: Intubated, not on sedation in the ICU, obtunded Resp: oral ETT in place, patient has spontaneous respirations Abd: obese, soft, rounded, non-rigid  Neurological exam She is intubated, not on sedation She did not open eyes to stimuli on NP examination, however, for MD, she opened eyes to voice She has a disconjugate gaze without nystagmus. She has a right sided gaze preference that does not cross midline with VOR. Pupils are equal round react light, corneal reflexes are present, cough and gag reflexes are present. Does not blink to threat from either side Facial symmetry difficult ascertain due to presence of ETT and securement device She withdraws bilateral lower extremities to noxious stimulation with no movement of bilateral upper extremities.  Toes are upgoing bilaterally.  DTR are 2+ and symmetric patellae and biceps.   Pertinent Labs: CBC    Component Value Date/Time   WBC 6.7 05/19/2021 0237   RBC 3.29 (L) 05/19/2021 0237   HGB 9.5 (L) 05/19/2021 0237   HGB 11.7 09/12/2020 1152   HCT 31.9 (L) 05/19/2021 0237   HCT 36.9 09/12/2020 1152   PLT 327 05/19/2021 0237   PLT 298 09/12/2020 1152   MCV 97.0 05/19/2021 0237   MCV 91 09/12/2020 1152   MCH 28.9 05/19/2021 0237   MCHC 29.8 (L) 05/19/2021 0237   RDW 12.4 05/19/2021 0237   RDW 11.8 09/12/2020 1152   LYMPHSABS 2.3 05/17/2021 0222   MONOABS 1.0 05/17/2021 0222   EOSABS 0.5 05/17/2021 0222   BASOSABS 0.1 05/17/2021 0222   CMP     Component Value Date/Time   NA 144 05/19/2021 0237   NA 144 09/27/2020 1036   K 5.0 05/19/2021 0237   CL 112 (H) 05/19/2021 0237   CO2 24 05/19/2021 0237   GLUCOSE 177 (H) 05/19/2021 0237   BUN 58 (H) 05/19/2021 0237   BUN 31 (H) 09/27/2020 1036   CREATININE  1.74 (H) 05/19/2021 0237   CALCIUM 9.0 05/19/2021 0237   PROT 5.6 (L) 05/17/2021 0222   PROT 6.7 09/12/2020 1152   ALBUMIN 2.3 (L) 05/17/2021 0222   ALBUMIN 4.3 09/12/2020 1152   AST 74 (H) 05/17/2021 0222   ALT 42 05/17/2021 0222   ALKPHOS 48 05/17/2021 0222   BILITOT 0.5 05/17/2021 0222   BILITOT 0.3 09/12/2020 1152   GFRNONAA 31 (L) 05/19/2021 0237   GFRAA 25 (L) 07/26/2020 0931   HSV PCR negative  Imaging Reviewed: No new imaging.   Overnight EEG 05/17/2021 -05/18/2021: "This study is suggestive of moderate diffuse encephalopathy, nonspecific etiology. No seizures or epileptiform discharges were seen throughout the recording."  Assessment: 70 y.o. female who presented to the ED 10/31 for evaluation of unresponsiveness with hypoglycemia (CBG in the 30's PTA) without improvement in mental status following correction of hypoglycemia. T max 100 F axillary. Preliminary LP results unremarkable. Gram stain negative thus far. HSV PCR is negative-acyclovir can be discontinued. Initial rEEG c/f electrographic seizures but subsequent LTM was felt to only represent tripahsics and was d/c'd 3 days ago.  On 11/4 patient continued to have persistent severe encephalopathy and inability to cross midline gaze to L with oculocephalics and was again hooked up to continuous EEG without evidence of seizures or epileptiform activity with discontinuation of  LTM yesterday. Patient had recurrence of rightward gaze without crossing midline with oculocephalics today on examination and repeat MRI brain was ordered for further evaluation. HSV PCR CSF with negative results, acyclovir discontinued 11/5.   Her exam has been pretty poor over the past few days with waxing and waning eye opening and rightward gaze preference.  Suspect poor brain reserve, prolonged hypoglycemia causing severe encephalopathy.  We will continue to reassess for improvement and will obtain an MRI brain for further evaluation as above.   Likely  prolonged hypoglycemic state leading to prolonged encephalopathy  Recommendations: - Continue Keppra 500mg  IV q12 hrs.  - Medical management per primary team - Prolonged hypoglycemia and encephalopathy may have caused irreversible damage but only further course of time will tell how she is doing. Will repeat MRI brain wo contrast for further evaluation.  - Neurology will continue to follow - Discussed with Jennelle Human, NP with PCCM at bedside   Anibal Henderson, AGACNP-BC [facility] [phone]   Attending addendum Patient seen and examined Opens eyes to voice today. Does not follow commands Agree with the exam above that I have verified Due to continued poor exam, recommended MRI of the brain to be repeated.  I have reviewed the imaging personally.  It does not show any acute intracranial abnormality and is stable when compared to the MRI from 05/14/2021 with moderately advanced nonspecific signal changes in the cerebral white matter involving the basal ganglia. Continue antiepileptics Medical management primary team I suspect the prolonged hypoglycemia, and poor brain reserve are playing a role in her slow recovery. We will follow.  Plan was discussed with Kennieth Rad, NP PCCM  -- Amie Portland, MD Neurologist [facility] Pager: [phone]   CRITICAL CARE ATTESTATION Performed by: Amie Portland, MD Total critical care time: 33 minutes Critical care time was exclusive of separately billable procedures and treating other patients and/or supervising APPs/Residents/Students Critical care was necessary to treat or prevent imminent or life-threatening deterioration due to hypoglycemic encephalopathy This patient is critically ill and at significant risk for neurological worsening and/or death and care requires constant monitoring. Critical care was time spent personally by me on the following activities: development of treatment plan with patient and/or  surrogate as well as nursing, discussions with consultants, evaluation of patient's response to treatment, examination of patient, obtaining history from patient or surrogate, ordering and performing treatments and interventions, ordering and review of laboratory studies, ordering and review of radiographic studies, pulse oximetry, re-evaluation of patient's condition, participation in multidisciplinary rounds and medical decision making of high complexity in the care of this patient.

## 2021-05-19 NOTE — Progress Notes (Signed)
NAME:  Kimberly Gross, MRN:  [id number], DOB:  [birth year], LOS: 6 ADMISSION DATE:  05/13/2021, CONSULTATION DATE:  05/13/21 REFERRING MD:  Ronnald Nian - EM, CHIEF COMPLAINT:  AMS    History of Present Illness:  70 yo F PMH CKD IV, DM, bipolar disorder, HTN, Osa on CPAP who was found obtunded in bed by family member 10/31 afternoon found to be severely hypoglycemic which improved with glucagon but no improvement in mental status. Intubated for GCS 3. CT head unremarkable, CBC and CMP also unremarkable.  Pertinent  Medical History  CKD IV Bipolar Disorder DM2  HLD Anxiety HTN OSA on CPAP  Significant Hospital Events: Including procedures, antibiotic start and stop dates in addition to other pertinent events   10/31 Obtunded and hypoglycemic for EMS, mentation did not improve with glucagon. Intubated in ED, CT H non acute. Admitting to PCCM  11/1 LP performed by IR, CSF clear with no organisms on gram stain 11/2 routine EEG read as seizure activity, loaded with levetiracetam 11/3 LTM EEG reviewed, correlating with metabolic encephalopathy rather than epileptiform activity 11/5 no acute events overnight, remains on continuous EEG with no signs of seizures, did not wean on MV  Interim History / Subjective:  Opens eyes today to verbal but still not f/c Afebrile  Weaning today 10/5  Objective   Blood pressure (!) 151/60, pulse 71, temperature 98.8 F (37.1 C), temperature source Oral, resp. rate 18, height 5\' 4"  (1.626 m), weight 114.5 kg, SpO2 100 %.    Vent Mode: PSV;CPAP FiO2 (%):  [30 %] 30 % Set Rate:  [18 bmp] 18 bmp Vt Set:  [440 mL] 440 mL PEEP:  [5 cmH20] 5 cmH20 Pressure Support:  [10 cmH20] 10 cmH20 Plateau Pressure:  [16 cmH20-20 cmH20] 20 cmH20   Intake/Output Summary (Last 24 hours) at 05/19/2021 0831 Last data filed at 05/19/2021 0800 Gross per 24 hour  Intake 2913.14 ml  Output 2925 ml  Net -11.86 ml   Filed Weights   05/16/21 0500 05/17/21 0412 05/19/21 0500   Weight: 113.4 kg 114.7 kg 114.5 kg    Examination: General:  acute on chronically ill, morbidly obese, appearing elderly female lying in bed in NAD HEENT: MM pink/moist, some increased oral secretions, pupils 3/reactive, roving eye movements/ disconjugate gaze, +corneals Neuro:  did not open eyes for me, withdrew in LE, flaccid upper extremities, no response to noxious stimuli CV: rr, NSR PULM:  non labored on PSV 10/5, clear, diminished in bases, no secretions, +cough, diminished gag GI: soft, bs+, purwick, flexiseal in place Extremities: warm/dry, +1-2 LE edema  Skin: no rashes   UOP 2.4L/ 24 hr s/p lasix 20mg  11/5 -216 ml/ net +5.5L  Labs and micro data reviewed: neg micro, sCr 1.89 > 1.74, glucose 177, Hgb 9.5  Resolved Hospital Problem list     Assessment & Plan:   Acute metabolic encephalopathy -Suspect hypoglycemic encephalopathy from prolonged hypoglycemia, prognosis is poor given minimal improvement. -HSV PCR negative -only sedation ativan 2mg  x 1 on 11/3 P: Repeating MRI brain today Neuro following, appreciate assistance Maintain neuro protective measures; goal for eurothermia, euglycemia, eunatermia, normoxia, and PCO2 goal of 35-40 Nutrition and bowel regiment  Seizure precautions, EEG d/c'd 11/5 AEDs per neurology, remains on Keppra Aspirations precautions  Supplemental thiamine Still not requiring any sedation  Fever, resolved -Treating for possible aspiration pneumonia. Intermittent low-grade fever, improving. P: Completes empiric Unasyn 11/7 Follow cultures- BC and trach asp negative, CSF cx neg Monitor clinically   Acute respiratory  failure with hypoxia -In the setting of significantly reduced mentation requiring intubation for airway support P: Continue MV support, 8cc/kg IBW with goal Pplat <30 and DP<15  VAP prevention protocol/ PPI PAD protocol for sedation> not needed  Daily SBT- weaning today, mental status remains barrier to vent  liberation Intermittent CXR  Left hip external rotation -Concerning for hip fracture P: Left hip x-ray negative for acute fracture  Left upper extremity edema P: [place] neg for DVT  AKI on CKD IV  Elevated CK - improving P: Continue purwick, monitor for urinary retention Repeat lasix 20mg  today  UOP great, sCr continues to improve Trend BMP / urinary output Replace electrolytes as indicated Avoid nephrotoxic agents, ensure adequate renal perfusion  DM2  Hypoglycemia -No further hypoglycemia P: Continue SSI sensitive, remains within goal CBG checks every 4 hours Continue tube feeds  Diarrhea P: Rectal tube remains in place  HTN HLD -Home medications include Lasix, Norvasc, aspirin, Lipitor, carvedilol, hydralazine P: Continue ASA, statin, coreg 25mg  BID, restart norvasc, holding hydralazine, additional lasix today   Best Practice (right click and "Reselect all SmartList Selections" daily)   Diet/type: tubefeeds and NPO DVT prophylaxis: prophylactic heparin  GI prophylaxis: PPI Lines: N/A Foley:  N/A Code Status:  full code Last date of multidisciplinary goals of care discussion [pending 11/6]  Critical care time:   CRITICAL CARE Performed by: Kennieth Rad  Total critical care time: 35 minutes  Critical care time was exclusive of separately billable procedures and treating other patients.  Critical care was necessary to treat or prevent imminent or life-threatening deterioration.  Critical care was time spent personally by me on the following activities: development of treatment plan with patient and/or surrogate as well as nursing, discussions with consultants, evaluation of patient's response to treatment, examination of patient, obtaining history from patient or surrogate, ordering and performing treatments and interventions, ordering and review of laboratory studies, ordering and review of radiographic studies, pulse oximetry and re-evaluation of patient's  condition.     Kennieth Rad, ACNP [facility] 05/19/2021, 8:32 AM  See Amion for pager If no response to pager, please call PCCM consult pager After 7:00 pm call Elink

## 2021-05-20 DIAGNOSIS — J9601 Acute respiratory failure with hypoxia: Secondary | ICD-10-CM | POA: Diagnosis not present

## 2021-05-20 DIAGNOSIS — G9341 Metabolic encephalopathy: Secondary | ICD-10-CM | POA: Diagnosis not present

## 2021-05-20 DIAGNOSIS — E1122 Type 2 diabetes mellitus with diabetic chronic kidney disease: Secondary | ICD-10-CM | POA: Diagnosis not present

## 2021-05-20 DIAGNOSIS — E162 Hypoglycemia, unspecified: Secondary | ICD-10-CM | POA: Diagnosis not present

## 2021-05-20 LAB — GLUCOSE, CAPILLARY
Glucose-Capillary: 165 mg/dL — ABNORMAL HIGH (ref 70–99)
Glucose-Capillary: 173 mg/dL — ABNORMAL HIGH (ref 70–99)
Glucose-Capillary: 147 mg/dL — ABNORMAL HIGH (ref 70–99)
Glucose-Capillary: 177 mg/dL — ABNORMAL HIGH (ref 70–99)
Glucose-Capillary: 192 mg/dL — ABNORMAL HIGH (ref 70–99)

## 2021-05-20 MED ORDER — LEVETIRACETAM 100 MG/ML PO SOLN
500.0000 mg | Freq: Two times a day (BID) | ORAL | Status: DC
  Administered 2021-05-20 – 2021-05-28 (×15): 500 mg
  Filled 2021-05-20 (×18): qty 5

## 2021-05-20 MED ORDER — FUROSEMIDE 10 MG/ML IJ SOLN
20.0000 mg | Freq: Once | INTRAMUSCULAR | Status: AC
  Administered 2021-05-20: 20 mg via INTRAVENOUS
  Filled 2021-05-20: qty 2

## 2021-05-20 MED ORDER — HYDRALAZINE HCL 50 MG PO TABS
50.0000 mg | ORAL_TABLET | Freq: Three times a day (TID) | ORAL | Status: DC
  Administered 2021-05-20 – 2021-05-21 (×6): 50 mg
  Filled 2021-05-20 (×6): qty 1

## 2021-05-20 NOTE — Progress Notes (Signed)
NAME:  Kimberly Gross, MRN:  [id number], DOB:  1951/04/20, LOS: 7 ADMISSION DATE:  05/13/2021, CONSULTATION DATE:  05/13/21 REFERRING MD:  Ronnald Nian - EM, CHIEF COMPLAINT:  AMS    History of Present Illness:  70 yo F PMH CKD IV, DM, bipolar disorder, HTN, Osa on CPAP who was found obtunded in bed by family member 10/31 afternoon found to be severely hypoglycemic which improved with glucagon but no improvement in mental status. Intubated for GCS 3. CT head unremarkable, CBC and CMP also unremarkable.  Pertinent  Medical History  CKD IV Bipolar Disorder DM2  HLD Anxiety HTN OSA on CPAP  Significant Hospital Events: Including procedures, antibiotic start and stop dates in addition to other pertinent events   10/31 Obtunded and hypoglycemic for EMS, mentation did not improve with glucagon. Intubated in ED, CT H non acute. Admitting to PCCM  11/1 LP performed by IR, CSF clear with no organisms on gram stain 11/2 routine EEG read as seizure activity, loaded with levetiracetam 11/3 LTM EEG reviewed, correlating with metabolic encephalopathy rather than epileptiform activity 11/5 no acute events overnight, remains on continuous EEG with no signs of seizures, did not wean on MV 11/7 weaned to PSV  Interim History / Subjective:  Remains obtunded, withdrawing to pain minimally in the lower extremities, not following commands, non-purposeful eye movements Afebrile  Weaned to PSV 10/5 Family updated at bedside  Objective   Blood pressure (!) 164/66, pulse 69, temperature 99.6 F (37.6 C), temperature source Axillary, resp. rate 16, height 5\' 4"  (1.626 m), weight 113 kg, SpO2 99 %.    Vent Mode: PSV;CPAP FiO2 (%):  [30 %-40 %] 30 % Set Rate:  [18 bmp] 18 bmp Vt Set:  [440 mL] 440 mL PEEP:  [5 cmH20] 5 cmH20 Pressure Support:  [10 cmH20] 10 cmH20 Plateau Pressure:  [20 cmH20] 20 cmH20   Intake/Output Summary (Last 24 hours) at 05/20/2021 0815 Last data filed at 05/20/2021 7253 Gross per  24 hour  Intake 1747.09 ml  Output 1525 ml  Net 222.09 ml    Filed Weights   05/17/21 0412 05/19/21 0500 05/20/21 0500  Weight: 114.7 kg 114.5 kg 113 kg    Examination: General:  acute on chronically ill, morbidly obese, appearing elderly female lying in bed in NAD HEENT: MM pink/moist, PERRL, roving eye movements Neuro:  did not open eyes to command, withdrew in LE, flaccid upper extremities, no response to noxious stimuli CV: RRR, 2/6 systolic murmur PULM:  non labored on PSV 10/5, CTAB GI: soft, bs+, purewick, flexiseal in place Extremities: warm/dry, +1-2 LE edema  Skin: no rashes   UOP 1.3L/ 24 hr s/p lasix 20mg  11/6 +487 ml/ net +6L  Labs and micro data reviewed: neg micro, sCr 1.89 > 1.74, glucose 177, Hgb 9.5  Resolved Hospital Problem list     Assessment & Plan:   Acute metabolic encephalopathy Suspect hypoglycemic encephalopathy from prolonged hypoglycemia, prognosis is poor given minimal improvement. HSV PCR negative only sedation ativan 2mg  x 1 on 11/3 Repeat MRI negative P: Neuro following, appreciate assistance Maintain neuro protective measures; goal for eurothermia, euglycemia, eunatermia, normoxia, and PCO2 goal of 35-40 Nutrition and bowel regiment  Seizure precautions, EEG d/c'd 11/5 AEDs per neurology, remains on Keppra Aspirations precautions  Supplemental thiamine Still not requiring any sedation  Fever, resolved Treating for possible aspiration pneumonia, will finish antibiotics today P: Completes empiric Unasyn today 11/7 Follow cultures- BC and trach asp negative, CSF cx neg Monitor clinically  Acute respiratory failure with hypoxia In the setting of significantly reduced mentation requiring intubation for airway support Weaned to PSV today P: Continue PSV support VAP prevention protocol/ PPI PAD protocol for sedation> not needed  Intermittent CXR  Left hip external rotation -Concerning for hip fracture P: Left hip x-ray  negative for acute fracture  Left upper extremity edema P: [place] neg for DVT  AKI on CKD IV  Elevated CK - improving P: Continue purwick, monitor for urinary retention Repeat lasix 20mg  today  UOP great, sCr continues to improve Trend BMP / urinary output Replace electrolytes as indicated Avoid nephrotoxic agents, ensure adequate renal perfusion  DM2  Hypoglycemia -No further hypoglycemia P: Continue SSI sensitive, remains within goal CBG checks every 4 hours Continue tube feeds  Diarrhea P: Rectal tube remains in place  HTN HLD -Home medications include Lasix, Norvasc, aspirin, Lipitor, carvedilol, hydralazine P: Continue ASA, statin, coreg 25mg  BID, amlodipine holding hydralazine additional IV furosemide 20 mg today  Best Practice (right click and "Reselect all SmartList Selections" daily)   Diet/type: tubefeeds and NPO DVT prophylaxis: prophylactic heparin  GI prophylaxis: PPI Lines: N/A Foley:  N/A Code Status:  full code Last date of multidisciplinary goals of care discussion [pending 11/6]  Critical care time:   Zola Button, MD [place], PGY-2  See Amion for pager If no response to pager, please call PCCM consult pager After 7:00 pm call Elink

## 2021-05-20 NOTE — Progress Notes (Signed)
Neurology Progress Note  Subjective: No acute changes overnight MRI brain obtained without evidence of acute intracranial abnormality   Exam: Vitals:   05/20/21 1000 05/20/21 1111  BP: (!) 161/67   Pulse: 67 69  Resp: 19 20  Temp:    SpO2: 97% 97%   Gen: Intubated, not on sedation in the ICU, obtunded Resp: oral ETT in place, patient has spontaneous respirations Abd: obese, soft, rounded, non-rigid  Neurological exam She is intubated, not on sedation She opens eyes briefly to noxious stimuli.  She has a disconjugate gaze without nystagmus. She has a right sided gaze preference that does not cross midline with VOR. Pupils are equal round react light, corneal reflexes are present, cough and gag reflexes are present. Does not blink to threat throughout Facial symmetry difficult ascertain due to presence of ETT and securement device She has triple flexion of bilateral lower extremities to noxious stimulation with no movement of bilateral upper extremities.  Toes are upgoing bilaterally.  DTR are 2+ and symmetric patellae and biceps.   Pertinent Labs: CBC    Component Value Date/Time   WBC 6.7 05/19/2021 0237   RBC 3.29 (L) 05/19/2021 0237   HGB 9.5 (L) 05/19/2021 0237   HGB 11.7 09/12/2020 1152   HCT 31.9 (L) 05/19/2021 0237   HCT 36.9 09/12/2020 1152   PLT 327 05/19/2021 0237   PLT 298 09/12/2020 1152   MCV 97.0 05/19/2021 0237   MCV 91 09/12/2020 1152   MCH 28.9 05/19/2021 0237   MCHC 29.8 (L) 05/19/2021 0237   RDW 12.4 05/19/2021 0237   RDW 11.8 09/12/2020 1152   LYMPHSABS 2.3 05/17/2021 0222   MONOABS 1.0 05/17/2021 0222   EOSABS 0.5 05/17/2021 0222   BASOSABS 0.1 05/17/2021 0222   CMP     Component Value Date/Time   NA 144 05/19/2021 0237   NA 144 09/27/2020 1036   K 5.0 05/19/2021 0237   CL 112 (H) 05/19/2021 0237   CO2 24 05/19/2021 0237   GLUCOSE 177 (H) 05/19/2021 0237   BUN 58 (H) 05/19/2021 0237   BUN 31 (H) 09/27/2020 1036   CREATININE 1.74 (H)  05/19/2021 0237   CALCIUM 9.0 05/19/2021 0237   PROT 5.6 (L) 05/17/2021 0222   PROT 6.7 09/12/2020 1152   ALBUMIN 2.3 (L) 05/17/2021 0222   ALBUMIN 4.3 09/12/2020 1152   AST 74 (H) 05/17/2021 0222   ALT 42 05/17/2021 0222   ALKPHOS 48 05/17/2021 0222   BILITOT 0.5 05/17/2021 0222   BILITOT 0.3 09/12/2020 1152   GFRNONAA 31 (L) 05/19/2021 0237   GFRAA 25 (L) 07/26/2020 0931   HSV PCR negative  Imaging Reviewed: MRI brain without contrast 11/6: Personally reviewed by attending MD 1.  No acute intracranial abnormality identified. 2. Stable MRI appearance of the brain since 05/14/2021 with moderately advanced nonspecific signal changes in the cerebral white matter, mildly involving the bilateral basal ganglia.  Overnight EEG 05/17/2021 -05/18/2021: "This study is suggestive of moderate diffuse encephalopathy, nonspecific etiology. No seizures or epileptiform discharges were seen throughout the recording."  Assessment: 70 y.o. female who presented to the ED 10/31 for evaluation of unresponsiveness with hypoglycemia (CBG in the 30's PTA) without improvement in mental status following correction of hypoglycemia. T max 100 F axillary. Preliminary LP results unremarkable. Gram stain negative thus far. HSV PCR is negative-acyclovir can be discontinued. Initial rEEG c/f electrographic seizures but subsequent LTM was felt to only represent tripahsics and was d/c'd 3 days ago.  On 11/4 patient  continued to have persistent severe encephalopathy and inability to cross midline gaze to L with oculocephalics and was again hooked up to continuous EEG without evidence of seizures or epileptiform activity with discontinuation of LTM yesterday. Patient had recurrence of rightward gaze without crossing midline with oculocephalics today on examination and repeat MRI brain on 11/6 was ordered for further evaluation which was negative for any new acute intracranial process. HSV PCR CSF with negative results, acyclovir  discontinued 11/5.   Her exam has been pretty poor over the past few days with waxing and waning eye opening and rightward gaze preference.  Suspect poor brain reserve, prolonged hypoglycemia causing severe encephalopathy. Repeat MRI brain with no acute abnormality and with stable appearance of brain since previous MRI.  Likely prolonged hypoglycemic state leading to prolonged encephalopathy  Recommendations: - Continue Keppra 500mg  IV q12 hrs.  - Medical management per primary team - Prolonged hypoglycemia and encephalopathy may have caused irreversible damage but only further course of time will tell how she is doing.  - Please consider palliative care consult to assist family in decision making - Neurology will be available on an as-needed basis going forward, please reach out if any questions or concerns arise  Anibal Henderson, AGACNP-BC [facility] [phone]  Attending Neurologist's note:  I personally saw this patient, gathering history, performing the neurologic examination, reviewing relevant labs, personally reviewing relevant imaging including MRI brain, and formulated the assessment and plan, adding the note above for completeness and clarity to accurately reflect my thoughts   Lesleigh Noe MD-PhD [facility] [phone] Available 7 AM to 7 PM, outside these hours please contact Neurologist on call listed on [place] Performed by: Lorenza Chick   Total critical care time: 40 minutes  Critical care time was exclusive of separately billable procedures and treating other patients, and independent of time spent by NP.  Critical care was necessary to treat or prevent imminent or life-threatening deterioration.  Critical care was time spent personally by me on the following activities: development of treatment plan with patient and/or surrogate as well as nursing, discussions with consultants, evaluation of patient's response to  treatment, examination of patient, obtaining history from patient or surrogate, ordering and performing treatments and interventions, ordering and review of laboratory studies, ordering and review of radiographic studies, pulse oximetry and re-evaluation of patient's condition.  Additionally discussed with family at bedside patient's uncertain and guarded prognosis.

## 2021-05-20 NOTE — Progress Notes (Signed)
Pt placed on PSV 10/5 per wean protocol and is tolerating well at this time. RN made aware.

## 2021-05-21 DIAGNOSIS — G9341 Metabolic encephalopathy: Secondary | ICD-10-CM | POA: Diagnosis not present

## 2021-05-21 DIAGNOSIS — J9601 Acute respiratory failure with hypoxia: Secondary | ICD-10-CM | POA: Diagnosis not present

## 2021-05-21 DIAGNOSIS — E162 Hypoglycemia, unspecified: Secondary | ICD-10-CM | POA: Diagnosis not present

## 2021-05-21 DIAGNOSIS — E1122 Type 2 diabetes mellitus with diabetic chronic kidney disease: Secondary | ICD-10-CM | POA: Diagnosis not present

## 2021-05-21 LAB — GLUCOSE, CAPILLARY
Glucose-Capillary: 166 mg/dL — ABNORMAL HIGH (ref 70–99)
Glucose-Capillary: 168 mg/dL — ABNORMAL HIGH (ref 70–99)
Glucose-Capillary: 172 mg/dL — ABNORMAL HIGH (ref 70–99)
Glucose-Capillary: 181 mg/dL — ABNORMAL HIGH (ref 70–99)
Glucose-Capillary: 199 mg/dL — ABNORMAL HIGH (ref 70–99)
Glucose-Capillary: 207 mg/dL — ABNORMAL HIGH (ref 70–99)

## 2021-05-21 NOTE — Progress Notes (Signed)
NAME:  Kimberly Gross, MRN:  [id number], DOB:  06-05-1951, LOS: 8 ADMISSION DATE:  05/13/2021, CONSULTATION DATE:  05/13/21 REFERRING MD:  Ronnald Nian - EM, CHIEF COMPLAINT:  AMS    History of Present Illness:  70 yo F PMH CKD IV, DM, bipolar disorder, HTN, Osa on CPAP who was found obtunded in bed by family member 10/31 afternoon found to be severely hypoglycemic which improved with glucagon but no improvement in mental status. Intubated for GCS 3. CT head unremarkable, CBC and CMP also unremarkable.  Pertinent  Medical History  CKD IV Bipolar Disorder DM2  HLD Anxiety HTN OSA on CPAP  Significant Hospital Events: Including procedures, antibiotic start and stop dates in addition to other pertinent events   10/31 Obtunded and hypoglycemic for EMS, mentation did not improve with glucagon. Intubated in ED, CT H non acute. Admitting to PCCM  11/1 LP performed by IR, CSF clear with no organisms on gram stain 11/2 routine EEG read as seizure activity, loaded with levetiracetam 11/3 LTM EEG reviewed, correlating with metabolic encephalopathy rather than epileptiform activity 11/5 no acute events overnight, remains on continuous EEG with no signs of seizures, did not wean on MV 11/7 completed course of ampicillin-sulbactam for aspiration pneumonia  Interim History / Subjective:  Remains obtunded, withdrawing to pain minimally in the lower extremities, not following commands, occasional facial movements Afebrile  Placed back on PRVC, weaned to PSV at 0700  Objective   Blood pressure (!) 173/61, pulse 68, temperature 99.2 F (37.3 C), temperature source Axillary, resp. rate (!) 21, height 5\' 4"  (1.626 m), weight 110.7 kg, SpO2 98 %.    Vent Mode: PSV;CPAP FiO2 (%):  [30 %] 30 % Set Rate:  [18 bmp] 18 bmp Vt Set:  [440 mL] 440 mL PEEP:  [5 cmH20] 5 cmH20 Pressure Support:  [5 cmH20-10 cmH20] 5 cmH20 Plateau Pressure:  [20 cmH20] 20 cmH20   Intake/Output Summary (Last 24 hours) at  05/21/2021 0814 Last data filed at 05/21/2021 0700 Gross per 24 hour  Intake 1642.67 ml  Output 2375 ml  Net -732.33 ml    Filed Weights   05/19/21 0500 05/20/21 0500 05/21/21 0326  Weight: 114.5 kg 113 kg 110.7 kg    Examination: General:  acute on chronically ill, morbidly obese, appearing elderly female lying in bed in NAD HEENT: MM pink/moist, PERRL, fixed rightward gaze Neuro:  did not open eyes to command, withdrew in LE, flaccid upper extremities CV: RRR, 2/6 systolic murmur PULM:  non labored, CTAB GI: soft, bs+, purewick, flexiseal in place Extremities: warm/dry Skin: no rashes   UOP 2.0L/ 24 hr net +5.4L  Resolved Hospital Problem list   Fever Possible aspiration pneumonia Left hip external rotation - hip fracture ruled out   Assessment & Plan:   Acute metabolic encephalopathy Suspect hypoglycemic encephalopathy from prolonged hypoglycemia, prognosis is poor given minimal improvement. HSV PCR negative only sedation ativan 2mg  x 1 on 11/3 Repeat MRI negative GOC discussions ongoing P: Neuro signed off Maintain neuro protective measures; goal for eurothermia, euglycemia, eunatermia, normoxia, and PCO2 goal of 35-40 Nutrition and bowel regiment  Seizure precautions, EEG d/c'd 11/5 AEDs per neurology, remains on Keppra Aspirations precautions  Supplemental thiamine Still not requiring any sedation Palliative care consult to assist with [place] discussion  Acute respiratory failure with hypoxia In the setting of significantly reduced mentation requiring intubation for airway support Weaned to PSV again today Mental status remains barrier to extubation, may ultimately require tracheostomy pending goals of  care P: Continue PSV support VAP prevention protocol/ PPI PAD protocol for sedation> not needed  Intermittent CXR  Left upper extremity edema P: [place] neg for DVT  AKI on CKD IV  Elevated CK - improving P: Continue purwick, monitor for urinary  retention Repeat lasix 20mg  today  UOP great, sCr continues to improve Trend BMP / urinary output Replace electrolytes as indicated Avoid nephrotoxic agents, ensure adequate renal perfusion  DM2  Hypoglycemia -No further hypoglycemia P: Continue SSI sensitive, remains within goal CBG checks every 4 hours Continue tube feeds  Diarrhea P: Rectal tube remains in place  HTN HLD -Home medications include Lasix, Norvasc, aspirin, Lipitor, carvedilol, hydralazine P: Continue ASA, statin, coreg 25mg  BID, amlodipine, lower dose hydralazine  Best Practice (right click and "Reselect all SmartList Selections" daily)   Diet/type: tubefeeds and NPO DVT prophylaxis: prophylactic heparin  GI prophylaxis: PPI Lines: N/A Foley:  N/A Code Status:  full code Last date of multidisciplinary goals of care discussion [11/7]  Critical care time:   Zola Button, MD [place], PGY-2  See Amion for pager If no response to pager, please call PCCM consult pager After 7:00 pm call Elink

## 2021-05-21 NOTE — Progress Notes (Signed)
Daughter requesting for transfer to another facility  Discussions with neurology and myself regarding cause of encephalopathy which in this case is the protracted hypoglycemia leading to hypoglycemic brain injury.  Other causes of encephalopathy have been ruled out at present  We continue to provide ventilatory and hemodynamic support, prognosis is guarded.  We will involve palliative care  As there is no clinical indication for transfer into a tertiary facility, I did inform patient's daughter about this that if she wants her transferred, we will facilitate the transfer if she is able to find an accepting physician and a facility.  Possible need for tracheostomy and PEG placement discussed  Continue current care at present

## 2021-05-21 NOTE — Consult Note (Signed)
Consultation Note Date: 05/23/21   Patient Name: Kimberly Gross  DOB: 1951/06/11  MRN: [id number]  Age / Sex: 70 y.o., female  PCP: [name], FNP Referring Physician: Laurin Coder, MD  Reason for Consultation: Establishing goals of care  HPI/Patient Profile: 70 yo female with past medical history of CKD stage IV, HTN, OSA on CPAP, diabetes, bipolar disorder admitted 05/13/21 with altered mental status and found to be hypoglycemic but no improvement after glucagon or Narcan. Required intubation. Unfortunately no improvement in mental status and concern for hypoglycemic brain injury.    Clinical Assessment and Goals of Care: I met today at Kimberly Gross's bedside with daughter, Kimberly Gross, after RN alerted me to family being present at bedside. Kimberly Gross and I reviewed her mother's condition, poor neurological status, poor weaning to breathe on her own, and overall poor prognosis. We discussed lack of reversible illness that we can treat to assist her mother to improve. We discussed prolonged hypoglycemia and brain injury. We discussed that there is unfortunately no different tests, medication, procedures, etc here or at any other hospital that can reverse and fix this problem. We discussed concern that her mother has continued with lack of improvement over days to weeks which is a concern that is likely not to have any significant improvement.   We discussed the temporary nature of ETT. We discussed paths forward with trach/PEG and this would be placing longer term tubes to support her breathing and nutrition which we are already providing so this is not expected to alter or improve her condition unfortunately. The alternative path would be to ultimately transition to comfort care and liberating her mother from ventilator support and allowing her body to have a natural death.   Kimberly Gross is appropriately tearful. She  reflects that her father died and they went through this with him 2 years ago. She shares that she is not ready to lose her mother. She is her mother's primary caregiver and struggles that this illness occurred while she was having her sister look out for her mother while she was out of town. I assure Kimberly Gross that a decision does not need to be made today but will need to be seriously considered as we will need to know in the next couple days.   All questions/concerns addressed. Emotional support provided.   Primary Decision Maker NEXT OF KIN Kimberly Gross    SUMMARY OF RECOMMENDATIONS   - Ongoing goals of care discussions.  - Trach/PEG vs comfort care undecided  Code Status/Advance Care Planning: Full code   Symptom Management:  Per PCCM.   Palliative Prophylaxis:  Aspiration, Bowel Regimen, Frequent Pain Assessment, Oral Care, and Turn Reposition  Psycho-social/Spiritual:  Desire for further Chaplaincy support:no Additional Recommendations: Grief/Bereavement Support  Prognosis:  Overall poor prognosis.   Discharge Planning: To Be Determined      Primary Diagnoses: Present on Admission:  Acute metabolic encephalopathy   I have reviewed the medical record, interviewed the patient and family, and examined the patient. The following aspects are pertinent.  Past Medical History:  Diagnosis Date   Anemia    Anxiety    Bipolar 1 disorder ([place])    CKD (chronic kidney disease), stage IV (HCC)    Depression    Diabetes mellitus without complication ([place])    Hyperlipemia    Hypertension    Occasional tremors    Social History   Socioeconomic History   Marital status: Widowed    Spouse name: Not on file   Number of children: 2   Years of education: Not on file   Highest education level: Not on file  Occupational History   Occupation: retired  Tobacco Use   Smoking status: Never   Smokeless tobacco: Never  Vaping Use   Vaping Use: Never used  Substance and Sexual  Activity   Alcohol use: No   Drug use: No   Sexual activity: Not Currently  Other Topics Concern   Not on file  Social History Narrative   Drinks 1-2 caffeine drinks a day    Her daughter & 4 grandchildren are living with her   Social Determinants of Health   Financial Resource Strain: High Risk   Difficulty of Paying Living Expenses: Hard  Food Insecurity: No Food Insecurity   Worried About Charity fundraiser in the Last Year: Never true   [place] in the Last Year: Never true  Transportation Needs: No Transportation Needs   Lack of Transportation (Medical): No   Lack of Transportation (Non-Medical): No  Physical Activity: Inactive   Days of Exercise per Week: 0 days   Minutes of Exercise per Session: 0 min  Stress: No Stress Concern Present   Feeling of Stress : Not at all  Social Connections: Not on file   Family History  Problem Relation Age of Onset   Hypertension Mother    Heart attack Father    Breast cancer Neg Hx    Scheduled Meds:  amLODipine  10 mg Per Tube Daily   aspirin  81 mg Per Tube Daily   atorvastatin  40 mg Per Tube QHS   carvedilol  25 mg Per Tube BID WC   chlorhexidine gluconate (MEDLINE KIT)  15 mL Mouth Rinse BID   Chlorhexidine Gluconate Cloth  6 each Topical Daily   feeding supplement (PROSource TF)  45 mL Per Tube TID   fiber  1 packet Per Tube BID   heparin  5,000 Units Subcutaneous Q8H   hydrALAZINE  50 mg Per Tube TID   insulin aspart  0-6 Units Subcutaneous Q4H   levETIRAcetam  500 mg Per Tube BID   mouth rinse  15 mL Mouth Rinse 10 times per day   pantoprazole sodium  40 mg Per Tube QHS   Continuous Infusions:  sodium chloride 10 mL/hr at 05/21/21 0600   feeding supplement (VITAL 1.5 CAL) 1,000 mL (05/21/21 0254)   lactated ringers     PRN Meds:.sodium chloride, acetaminophen, docusate, ondansetron (ZOFRAN) IV, polyethylene glycol Allergies  Allergen Reactions   Chlorpromazine Rash   Review of Systems  Unable to  perform ROS: Acuity of condition   Physical Exam Vitals and nursing note reviewed.  Constitutional:      Appearance: She is morbidly obese. She is ill-appearing.     Interventions: She is intubated.  Cardiovascular:     Rate and Rhythm: Normal rate.  Pulmonary:     Effort: No tachypnea, accessory muscle usage or respiratory distress. She is intubated.  Neurological:     Mental Status:  She is unresponsive.     Comments: Occasionally opens eyes and moves left leg but not to command. She did not do any of this during my visit but reported by nursing.     Vital Signs: BP (!) 152/64   Pulse 68   Temp 98.9 F (37.2 C) (Axillary)   Resp 20   Ht 5' 4"  (1.626 m)   Wt 110.7 kg   SpO2 98%   BMI 41.89 kg/m  Pain Scale: CPOT       SpO2: SpO2: 98 % O2 Device:SpO2: 98 % O2 Flow Rate: .   IO: Intake/output summary:  Intake/Output Summary (Last 24 hours) at 05/21/2021 1442 Last data filed at 05/21/2021 1400 Gross per 24 hour  Intake 1532.67 ml  Output 1575 ml  Net -42.33 ml    LBM: Last BM Date: 05/20/21 Baseline Weight: Weight: 120.2 kg Most recent weight: Weight: 110.7 kg     Palliative Assessment/Data:     Time In: 0945 Time Out: 1035 Time Total: 50 min Greater than 50%  of this time was spent counseling and coordinating care related to the above assessment and plan.  Signed by: Vinie Sill, NP Palliative Medicine Team Pager # [phone] (M-F 8a-5p) Team Phone # [phone] (Nights/Weekends)

## 2021-05-22 DIAGNOSIS — G9341 Metabolic encephalopathy: Secondary | ICD-10-CM | POA: Diagnosis not present

## 2021-05-22 DIAGNOSIS — E1122 Type 2 diabetes mellitus with diabetic chronic kidney disease: Secondary | ICD-10-CM | POA: Diagnosis not present

## 2021-05-22 DIAGNOSIS — J9601 Acute respiratory failure with hypoxia: Secondary | ICD-10-CM | POA: Diagnosis not present

## 2021-05-22 DIAGNOSIS — E162 Hypoglycemia, unspecified: Secondary | ICD-10-CM | POA: Diagnosis not present

## 2021-05-22 LAB — GLUCOSE, CAPILLARY
Glucose-Capillary: 169 mg/dL — ABNORMAL HIGH (ref 70–99)
Glucose-Capillary: 175 mg/dL — ABNORMAL HIGH (ref 70–99)
Glucose-Capillary: 184 mg/dL — ABNORMAL HIGH (ref 70–99)
Glucose-Capillary: 186 mg/dL — ABNORMAL HIGH (ref 70–99)
Glucose-Capillary: 186 mg/dL — ABNORMAL HIGH (ref 70–99)
Glucose-Capillary: 187 mg/dL — ABNORMAL HIGH (ref 70–99)
Glucose-Capillary: 222 mg/dL — ABNORMAL HIGH (ref 70–99)

## 2021-05-22 LAB — BASIC METABOLIC PANEL
Anion gap: 12 (ref 5–15)
BUN: 75 mg/dL — ABNORMAL HIGH (ref 8–23)
CO2: 22 mmol/L (ref 22–32)
Calcium: 9.5 mg/dL (ref 8.9–10.3)
Chloride: 110 mmol/L (ref 98–111)
Creatinine, Ser: 1.89 mg/dL — ABNORMAL HIGH (ref 0.44–1.00)
GFR, Estimated: 28 mL/min — ABNORMAL LOW (ref >60)
Glucose, Bld: 194 mg/dL — ABNORMAL HIGH (ref 70–99)
Potassium: 5.4 mmol/L — ABNORMAL HIGH (ref 3.5–5.1)
Sodium: 144 mmol/L (ref 135–145)

## 2021-05-22 LAB — CBC WITH DIFFERENTIAL/PLATELET
Abs Immature Granulocytes: 0.17 10*3/uL — ABNORMAL HIGH (ref 0.00–0.07)
Basophils Absolute: 0.2 10*3/uL — ABNORMAL HIGH (ref 0.0–0.1)
Basophils Relative: 2 %
Eosinophils Absolute: 0.5 10*3/uL (ref 0.0–0.5)
Eosinophils Relative: 5 %
HCT: 33.8 % — ABNORMAL LOW (ref 36.0–46.0)
Hemoglobin: 10.2 g/dL — ABNORMAL LOW (ref 12.0–15.0)
Immature Granulocytes: 2 %
Lymphocytes Relative: 33 %
Lymphs Abs: 3.5 10*3/uL (ref 0.7–4.0)
MCH: 29.3 pg (ref 26.0–34.0)
MCHC: 30.2 g/dL (ref 30.0–36.0)
MCV: 97.1 fL (ref 80.0–100.0)
Monocytes Absolute: 1 10*3/uL (ref 0.1–1.0)
Monocytes Relative: 10 %
Neutro Abs: 5.3 10*3/uL (ref 1.7–7.7)
Neutrophils Relative %: 48 %
Platelets: 424 10*3/uL — ABNORMAL HIGH (ref 150–400)
RBC: 3.48 MIL/uL — ABNORMAL LOW (ref 3.87–5.11)
RDW: 12.2 % (ref 11.5–15.5)
WBC: 10.6 10*3/uL — ABNORMAL HIGH (ref 4.0–10.5)
nRBC: 0 % (ref 0.0–0.2)

## 2021-05-22 LAB — MAGNESIUM: Magnesium: 2.5 mg/dL — ABNORMAL HIGH (ref 1.7–2.4)

## 2021-05-22 LAB — PHOSPHORUS: Phosphorus: 5.5 mg/dL — ABNORMAL HIGH (ref 2.5–4.6)

## 2021-05-22 MED ORDER — SODIUM ZIRCONIUM CYCLOSILICATE 5 G PO PACK
5.0000 g | PACK | Freq: Once | ORAL | Status: AC
  Administered 2021-05-22: 5 g
  Filled 2021-05-22 (×2): qty 1

## 2021-05-22 MED ORDER — LOPERAMIDE HCL 1 MG/7.5ML PO SUSP
2.0000 mg | ORAL | Status: DC | PRN
  Administered 2021-05-22 – 2021-06-09 (×4): 2 mg
  Filled 2021-05-22 (×7): qty 15

## 2021-05-22 MED ORDER — HYDRALAZINE HCL 50 MG PO TABS
100.0000 mg | ORAL_TABLET | Freq: Three times a day (TID) | ORAL | Status: DC
  Administered 2021-05-22 – 2021-05-28 (×19): 100 mg
  Filled 2021-05-22 (×19): qty 2

## 2021-05-22 MED ORDER — NUTRISOURCE FIBER PO PACK
1.0000 | PACK | Freq: Three times a day (TID) | ORAL | Status: DC
  Administered 2021-05-22 – 2021-05-29 (×19): 1
  Filled 2021-05-22 (×26): qty 1

## 2021-05-22 MED ORDER — LACTATED RINGERS IV BOLUS
500.0000 mL | Freq: Once | INTRAVENOUS | Status: AC
  Administered 2021-05-22: 500 mL via INTRAVENOUS

## 2021-05-22 NOTE — Progress Notes (Signed)
Palliative:  Initial plans made yesterday to meet with daughter, [name], at bedside today to discuss goals of care in person but she did not arrive to bedside today. I will reach out to her by phone to try and reconnect or catch her at bedside for further conversation tomorrow.   No charge  Vinie Sill, NP Palliative Medicine Team Pager [phone] (Please see [email or web address] for schedule) Team Phone [phone]

## 2021-05-22 NOTE — Progress Notes (Signed)
eLink Physician-Brief Progress Note Patient Name: Kimberly Gross DOB: [birth year] MRN: [id number]   Date of Service  05/22/2021  HPI/Events of Note  K+ 5.4  eICU Interventions  Lokelma 5 gm po x 1 ordered.        Kerry Kass Fareeda Downard 05/22/2021, 5:05 AM

## 2021-05-22 NOTE — Progress Notes (Signed)
NAME:  LAKESHIA DOHNER, MRN:  [id number], DOB:  [birth year], LOS: 9 ADMISSION DATE:  05/13/2021, CONSULTATION DATE:  05/13/21 REFERRING MD:  Ronnald Nian - EM, CHIEF COMPLAINT:  AMS    History of Present Illness:  70 yo F PMH CKD IV, DM, bipolar disorder, HTN, Osa on CPAP who was found obtunded in bed by family member 10/31 afternoon found to be severely hypoglycemic which improved with glucagon but no improvement in mental status. Intubated for GCS 3. CT head unremarkable, CBC and CMP also unremarkable.  Pertinent  Medical History  CKD IV Bipolar Disorder DM2  HLD Anxiety HTN OSA on CPAP  Significant Hospital Events: Including procedures, antibiotic start and stop dates in addition to other pertinent events   10/31 Obtunded and hypoglycemic for EMS, mentation did not improve with glucagon. Intubated in ED, CT H non acute. Admitting to PCCM  11/1 LP performed by IR, CSF clear with no organisms on gram stain 11/2 routine EEG read as seizure activity, loaded with levetiracetam 11/3 LTM EEG reviewed, correlating with metabolic encephalopathy rather than epileptiform activity 11/5 no acute events overnight, remains on continuous EEG with no signs of seizures, did not wean on MV 11/7 completed course of ampicillin-sulbactam for aspiration pneumonia  Interim History / Subjective:  Remains obtunded, withdrawing to pain minimally in the lower extremities, not following commands, occasional facial movements Afebrile   Objective   Blood pressure (!) 161/63, pulse 71, temperature 98.6 F (37 C), temperature source Axillary, resp. rate (!) 24, height 5\' 4"  (1.626 m), weight 109.6 kg, SpO2 99 %.    Vent Mode: PSV;CPAP FiO2 (%):  [30 %] 30 % Set Rate:  [18 bmp] 18 bmp Vt Set:  [440 mL] 440 mL PEEP:  [5 cmH20] 5 cmH20 Pressure Support:  [5 cmH20-10 cmH20] 10 cmH20 Plateau Pressure:  [16 cmH20-18 cmH20] 18 cmH20   Intake/Output Summary (Last 24 hours) at 05/22/2021 0854 Last data filed at  05/22/2021 0800 Gross per 24 hour  Intake 1150 ml  Output 1950 ml  Net -800 ml    Filed Weights   05/21/21 0326 05/22/21 0002 05/22/21 0416  Weight: 110.7 kg 109.6 kg 109.6 kg    Examination: General:  acute on chronically ill, morbidly obese, appearing elderly female lying in bed in NAD HEENT: MM pink/moist, PERRL, fixed rightward gaze which does not cross midline Neuro:  did not open eyes to command, does not blink to threat, withdrew in LE, flaccid upper extremities CV: RRR, 2/6 systolic murmur PULM:  non labored, CTAB GI: soft, bs+, purewick, flexiseal in place Extremities: warm/dry Skin: no rashes   UOP 1.75L/ 24 hr net +4.6L  Resolved Hospital Problem list   Fever Possible aspiration pneumonia Left hip external rotation - hip fracture ruled out   Assessment & Plan:   Acute metabolic encephalopathy Suspect hypoglycemic encephalopathy from prolonged hypoglycemia Mental status remains without significant change HSV PCR negative Repeat MRI negative GOC discussions ongoing P: Neuro signed off Maintain neuro protective measures; goal for eurothermia, euglycemia, eunatermia, normoxia, and PCO2 goal of 35-40 Nutrition and bowel regiment  Seizure precautions, EEG d/c'd 11/5 AEDs per neurology, remains on Keppra Aspirations precautions  Supplemental thiamine Still not requiring any sedation Palliative care consult to assist with GOC discussion  Acute respiratory failure with hypoxia In the setting of significantly reduced mentation requiring intubation for airway support Mental status remains barrier to extubation, may ultimately require tracheostomy pending goals of care P: Continue PSV support VAP prevention protocol/ PPI PAD protocol  for sedation> not needed  Intermittent CXR  Left upper extremity edema P: [place] neg for DVT  AKI on CKD IV  Elevated CK P: Continue purwick, monitor for urinary retention Slight increase in Cr - will give 500 cc LR  bolus Trend BMP / urinary output Replace electrolytes as indicated Avoid nephrotoxic agents, ensure adequate renal perfusion  DM2  Hypoglycemia -No further hypoglycemia P: Continue SSI sensitive, remains within goal CBG checks every 4 hours Continue tube feeds  Diarrhea P: Rectal tube remains in place  HTN HLD -Home medications include Lasix, Norvasc, aspirin, Lipitor, carvedilol, hydralazine P: Continue ASA, statin, coreg 25mg  BID, amlodipine Increase hydralazine 100 mg TID  Best Practice (right click and "Reselect all SmartList Selections" daily)   Diet/type: tubefeeds and NPO DVT prophylaxis: prophylactic heparin  GI prophylaxis: PPI Lines: N/A Foley:  N/A Code Status:  full code Last date of multidisciplinary goals of care discussion [11/8]  Critical care time:   Zola Button, MD [place], PGY-2  See Amion for pager If no response to pager, please call PCCM consult pager After 7:00 pm call Elink

## 2021-05-23 ENCOUNTER — Other Ambulatory Visit: Payer: Self-pay

## 2021-05-23 ENCOUNTER — Encounter (HOSPITAL_COMMUNITY): Payer: Self-pay | Admitting: Internal Medicine

## 2021-05-23 DIAGNOSIS — Z7189 Other specified counseling: Secondary | ICD-10-CM | POA: Diagnosis not present

## 2021-05-23 DIAGNOSIS — E162 Hypoglycemia, unspecified: Secondary | ICD-10-CM | POA: Diagnosis not present

## 2021-05-23 DIAGNOSIS — G9341 Metabolic encephalopathy: Secondary | ICD-10-CM | POA: Diagnosis not present

## 2021-05-23 DIAGNOSIS — E1122 Type 2 diabetes mellitus with diabetic chronic kidney disease: Secondary | ICD-10-CM | POA: Diagnosis not present

## 2021-05-23 DIAGNOSIS — J9601 Acute respiratory failure with hypoxia: Secondary | ICD-10-CM | POA: Diagnosis not present

## 2021-05-23 DIAGNOSIS — Z515 Encounter for palliative care: Secondary | ICD-10-CM | POA: Diagnosis not present

## 2021-05-23 DIAGNOSIS — G9340 Encephalopathy, unspecified: Secondary | ICD-10-CM | POA: Diagnosis not present

## 2021-05-23 LAB — BASIC METABOLIC PANEL
Anion gap: 8 (ref 5–15)
Anion gap: 9 (ref 5–15)
Anion gap: 7 (ref 5–15)
BUN: 82 mg/dL — ABNORMAL HIGH (ref 8–23)
BUN: 77 mg/dL — ABNORMAL HIGH (ref 8–23)
BUN: 78 mg/dL — ABNORMAL HIGH (ref 8–23)
CO2: 23 mmol/L (ref 22–32)
CO2: 24 mmol/L (ref 22–32)
CO2: 21 mmol/L — ABNORMAL LOW (ref 22–32)
Calcium: 9.7 mg/dL (ref 8.9–10.3)
Calcium: 9.6 mg/dL (ref 8.9–10.3)
Calcium: 9.5 mg/dL (ref 8.9–10.3)
Chloride: 113 mmol/L — ABNORMAL HIGH (ref 98–111)
Chloride: 113 mmol/L — ABNORMAL HIGH (ref 98–111)
Chloride: 115 mmol/L — ABNORMAL HIGH (ref 98–111)
Creatinine, Ser: 1.9 mg/dL — ABNORMAL HIGH (ref 0.44–1.00)
Creatinine, Ser: 2 mg/dL — ABNORMAL HIGH (ref 0.44–1.00)
Creatinine, Ser: 1.8 mg/dL — ABNORMAL HIGH (ref 0.44–1.00)
GFR, Estimated: 28 mL/min — ABNORMAL LOW (ref >60)
GFR, Estimated: 26 mL/min — ABNORMAL LOW (ref >60)
GFR, Estimated: 30 mL/min — ABNORMAL LOW (ref >60)
Glucose, Bld: 194 mg/dL — ABNORMAL HIGH (ref 70–99)
Glucose, Bld: 220 mg/dL — ABNORMAL HIGH (ref 70–99)
Glucose, Bld: 199 mg/dL — ABNORMAL HIGH (ref 70–99)
Potassium: 6.1 mmol/L — ABNORMAL HIGH (ref 3.5–5.1)
Potassium: 5.3 mmol/L — ABNORMAL HIGH (ref 3.5–5.1)
Potassium: 5.5 mmol/L — ABNORMAL HIGH (ref 3.5–5.1)
Sodium: 144 mmol/L (ref 135–145)
Sodium: 146 mmol/L — ABNORMAL HIGH (ref 135–145)
Sodium: 143 mmol/L (ref 135–145)

## 2021-05-23 LAB — GLUCOSE, CAPILLARY
Glucose-Capillary: 171 mg/dL — ABNORMAL HIGH (ref 70–99)
Glucose-Capillary: 176 mg/dL — ABNORMAL HIGH (ref 70–99)
Glucose-Capillary: 176 mg/dL — ABNORMAL HIGH (ref 70–99)
Glucose-Capillary: 162 mg/dL — ABNORMAL HIGH (ref 70–99)
Glucose-Capillary: 191 mg/dL — ABNORMAL HIGH (ref 70–99)
Glucose-Capillary: 192 mg/dL — ABNORMAL HIGH (ref 70–99)

## 2021-05-23 LAB — CBC
HCT: 35.3 % — ABNORMAL LOW (ref 36.0–46.0)
Hemoglobin: 10.4 g/dL — ABNORMAL LOW (ref 12.0–15.0)
MCH: 28.7 pg (ref 26.0–34.0)
MCHC: 29.5 g/dL — ABNORMAL LOW (ref 30.0–36.0)
MCV: 97.5 fL (ref 80.0–100.0)
Platelets: 433 10*3/uL — ABNORMAL HIGH (ref 150–400)
RBC: 3.62 MIL/uL — ABNORMAL LOW (ref 3.87–5.11)
RDW: 12.3 % (ref 11.5–15.5)
WBC: 11.9 10*3/uL — ABNORMAL HIGH (ref 4.0–10.5)
nRBC: 0 % (ref 0.0–0.2)

## 2021-05-23 LAB — MAGNESIUM: Magnesium: 2.6 mg/dL — ABNORMAL HIGH (ref 1.7–2.4)

## 2021-05-23 LAB — PHOSPHORUS: Phosphorus: 5.8 mg/dL — ABNORMAL HIGH (ref 2.5–4.6)

## 2021-05-23 MED ORDER — SODIUM ZIRCONIUM CYCLOSILICATE 10 G PO PACK
10.0000 g | PACK | Freq: Two times a day (BID) | ORAL | Status: AC
  Administered 2021-05-23: 10 g

## 2021-05-23 MED ORDER — DEXTROSE 50 % IV SOLN
12.5000 g | Freq: Once | INTRAVENOUS | Status: DC
  Filled 2021-05-23: qty 50

## 2021-05-23 MED ORDER — LACTATED RINGERS IV SOLN: INTRAVENOUS | Status: DC

## 2021-05-23 MED ORDER — INSULIN ASPART 100 UNIT/ML IV SOLN
10.0000 [IU] | Freq: Once | INTRAVENOUS | Status: AC
  Administered 2021-05-23: 10 [IU] via INTRAVENOUS

## 2021-05-23 MED ORDER — SODIUM CHLORIDE 0.45 % IV SOLN: INTRAVENOUS | Status: DC

## 2021-05-23 MED ORDER — SODIUM ZIRCONIUM CYCLOSILICATE 10 G PO PACK
10.0000 g | PACK | Freq: Two times a day (BID) | ORAL | Status: DC
  Administered 2021-05-23: 10 g

## 2021-05-23 MED ORDER — DEXTROSE 50 % IV SOLN
25.0000 g | Freq: Once | INTRAVENOUS | Status: AC
  Administered 2021-05-23: 25 g via INTRAVENOUS

## 2021-05-23 MED ORDER — LACTATED RINGERS IV BOLUS
500.0000 mL | Freq: Once | INTRAVENOUS | Status: AC
Start: 2021-05-23 — End: 2021-05-23
  Administered 2021-05-23: 500 mL via INTRAVENOUS

## 2021-05-23 NOTE — Progress Notes (Signed)
Nutrition Follow-up  DOCUMENTATION CODES:   Not applicable  INTERVENTION:   Continue tube feeding via OG tube: - Vital 1.5 @ 50 ml/hr (1200 ml/day) - ProSource TF 45 ml TID   Tube feeding regimen provides 1920 kcal, 114 grams of protein, and 917 ml of H2O.   - Continue NutriSource Fiber TID per tube   If potassium and phosphorus labs continue to trend up s/p intervention, can consider changing tube feeding formula: - Nepro @ 40 ml/hr (960 ml/day) - ProSource TF 45 ml TID  Tube feeding regimen provides 1848 kcal, 111 grams of protein, and 698 ml of H2O.   NUTRITION DIAGNOSIS:   Inadequate oral intake related to inability to eat as evidenced by NPO status.  Ongoing, being addressed via TF  GOAL:   Patient will meet greater than or equal to 90% of their needs  Met via TF  MONITOR:   Vent status, Labs, Weight trends, TF tolerance, I & O's  REASON FOR ASSESSMENT:   Ventilator, Consult Enteral/tube feeding initiation and management  ASSESSMENT:   70 year old female who presented to the ED on 10/31 with AMS. PMH of CKD stage IV, T2DM, bipolar disorder, HTN, OSA on CPAP. Pt admitted with acute metabolic encephalopathy, acute respiratory failure with hypoxia.  11/01 - s/p LP  Discussed pt with RN and during ICU rounds. Pt with suspected hypoglycemia encephalopathy from prolonged hypoglycemia. No significant change in mental status. Palliative Medicine has been consulted to assist with [place] discussions. Pt with hyperkalemia s/p lokelma. Pt also with hyperphosphatemia. Per RN, pt's diarrhea has improved.  Discussed adjusting TF formula with CCM resident. At this point, plan is to continue pt on current TF formula and trend electrolyte labs. RD will leave recommendations for low potassium and low phosphorus tube feeding formula for use if electrolytes continue to trend up s/p intervention.  Admit weight: 111.7 kg Current weight: 110.8 kg  Patient is currently intubated on  ventilator support MV: 6.8 L/min Temp (24hrs), Avg:99.2 F (37.3 C), Min:98.8 F (37.1 C), Max:99.6 F (37.6 C)  Drips: 1/2NS: 75 ml/hr  Medications reviewed and include: nutrisource fiber TID, SSI q 4 hours, protonix, lokelma 10 grams x 2  Labs reviewed: potassium 6.1, BUN 82, creatinine 1.90, phosphorus 5.8, magnesium 2.6 CBG's: 171-222 x 24   UOP: 1700 ml x 24 hours Stool: 300 ml x 24 hours I/O's: +4.2 L since admit  Diet Order:   Diet Order             Diet NPO time specified  Diet effective now                   EDUCATION NEEDS:   Not appropriate for education at this time  Skin:  Skin Assessment: Skin Integrity Issues: Other: skin tear to back x 2  Last BM:  05/23/21 rectal tube  Height:   Ht Readings from Last 1 Encounters:  05/13/21 $RemoveB'5\' 4"'LVxyKAHt$  (1.626 m)    Weight:   Wt Readings from Last 1 Encounters:  05/23/21 110.8 kg    Ideal Body Weight:  54.5 kg  BMI:  Body mass index is 41.93 kg/m.  Estimated Nutritional Needs:   Kcal:  1700-1900  Protein:  110-130 grams  Fluid:  1.7-1.9 L    Gustavus Bryant, MS, RD, LDN Inpatient Clinical Dietitian Please see AMiON for contact information.

## 2021-05-23 NOTE — Progress Notes (Signed)
[place] Progress Note Patient Name: Kimberly Gross DOB: 1950/08/28 MRN: [id number]   Date of Service  05/23/2021  HPI/Events of Note  K+ 6.1, creatinine 1.9  eICU Interventions  Lokelma 10 mg via OG tube Q 12 hours x 2 doses ordered.        Kerry Kass Jarron Curley 05/23/2021, 3:58 AM

## 2021-05-23 NOTE — Progress Notes (Signed)
NAME:  Kimberly Gross, MRN:  [id number], DOB:  [birth year], LOS: 36 ADMISSION DATE:  05/13/2021, CONSULTATION DATE:  05/13/21 REFERRING MD:  Ronnald Nian - EM, CHIEF COMPLAINT:  AMS    History of Present Illness:  70 yo F PMH CKD IV, DM, bipolar disorder, HTN, Osa on CPAP who was found obtunded in bed by family member 10/31 afternoon found to be severely hypoglycemic which improved with glucagon but no improvement in mental status. Intubated for GCS 3. CT head unremarkable, CBC and CMP also unremarkable.  Pertinent  Medical History  CKD IV Bipolar Disorder DM2  HLD Anxiety HTN OSA on CPAP  Significant Hospital Events: Including procedures, antibiotic start and stop dates in addition to other pertinent events   10/31 Obtunded and hypoglycemic for EMS, mentation did not improve with glucagon. Intubated in ED, CT H non acute. Admitting to PCCM  11/1 LP performed by IR, CSF clear with no organisms on gram stain 11/2 routine EEG read as seizure activity, loaded with levetiracetam 11/3 LTM EEG reviewed, correlating with metabolic encephalopathy rather than epileptiform activity 11/5 no acute events overnight, remains on continuous EEG with no signs of seizures, did not wean on MV 11/7 completed course of ampicillin-sulbactam for aspiration pneumonia  Interim History / Subjective:  Remains obtunded, withdrawing to pain minimally in the lower extremities, not following commands Afebrile  Hyperkalemia worsening, Lokelma increased  Objective   Blood pressure (!) 146/66, pulse 73, temperature 99.2 F (37.3 C), temperature source Axillary, resp. rate 18, height 5\' 4"  (1.626 m), weight 110.8 kg, SpO2 100 %.    Vent Mode: PRVC FiO2 (%):  [30 %] 30 % Set Rate:  [18 bmp] 18 bmp Vt Set:  [440 mL] 440 mL PEEP:  [5 cmH20] 5 cmH20 Pressure Support:  [10 cmH20] 10 cmH20 Plateau Pressure:  [14 cmH20-17 cmH20] 14 cmH20   Intake/Output Summary (Last 24 hours) at 05/23/2021 0743 Last data filed at  05/23/2021 9628 Gross per 24 hour  Intake 1240 ml  Output 2000 ml  Net -760 ml    Filed Weights   05/22/21 0002 05/22/21 0416 05/23/21 0228  Weight: 109.6 kg 109.6 kg 110.8 kg    Examination: General:  acute on chronically ill, morbidly obese, appearing elderly female lying in bed in NAD HEENT: MM pink/moist, PERRL Neuro:  did not open eyes to command, withdrew in left lower extremity, flaccid upper extremities CV: RRR, 2/6 systolic murmur PULM:  non labored, CTAB GI: soft, bs+, purewick, flexiseal in place Extremities: warm/dry Skin: no rashes   UOP 1.7L/ 24 hr net +3.8L  Resolved Hospital Problem list   Fever Possible aspiration pneumonia Left hip external rotation - hip fracture ruled out   Assessment & Plan:   Acute metabolic encephalopathy Suspect hypoglycemic encephalopathy from prolonged hypoglycemia Remains obtunded without significant change in mental status CSF studies including HSV PCR negative Repeat MRI negative LTM EEG without epileptiform activity GOC discussions ongoing P: Neuro signed off Maintain neuro protective measures; goal for eurothermia, euglycemia, eunatermia, normoxia, and PCO2 goal of 35-40 Nutrition and bowel regiment  Seizure precautions, EEG d/c'd 11/5 AEDs per neurology, remains on Keppra Aspirations precautions  Supplemental thiamine Still not requiring any sedation Palliative care consult to assist with GOC discussion  Acute respiratory failure with hypoxia In the setting of significantly reduced mentation requiring intubation for airway support Mental status remains barrier to extubation, may ultimately require tracheostomy pending goals of care P: Continue MV VAP prevention protocol/ PPI PAD protocol for sedation> not  needed  Intermittent CXR  Left upper extremity edema P: [place] neg for DVT  AKI on CKD IV  Elevated CK P: Continue purwick, monitor for urinary retention Trend BMP / urinary output Replace electrolytes  as indicated Avoid nephrotoxic agents, ensure adequate renal perfusion  Hyperkalemia Worsening - Lokelma x2 ordered overnight  DM2  Hypoglycemia -No further hypoglycemia P: Continue SSI sensitive, remains within goal CBG checks every 4 hours Continue tube feeds  Diarrhea P: Rectal tube remains in place  HTN HLD -Home medications include Lasix, Norvasc, aspirin, Lipitor, carvedilol, hydralazine P: Continue ASA, statin, coreg 25mg  BID, amlodipine, hydralazine  Best Practice (right click and "Reselect all SmartList Selections" daily)   Diet/type: tubefeeds and NPO DVT prophylaxis: prophylactic heparin  GI prophylaxis: PPI Lines: N/A Foley:  N/A Code Status:  full code Last date of multidisciplinary goals of care discussion [11/8]  Critical care time:   Zola Button, MD [place], PGY-2  See Amion for pager If no response to pager, please call PCCM consult pager After 7:00 pm call Elink

## 2021-05-24 DIAGNOSIS — Z7189 Other specified counseling: Secondary | ICD-10-CM | POA: Diagnosis not present

## 2021-05-24 DIAGNOSIS — Z515 Encounter for palliative care: Secondary | ICD-10-CM

## 2021-05-24 DIAGNOSIS — J9601 Acute respiratory failure with hypoxia: Secondary | ICD-10-CM | POA: Diagnosis not present

## 2021-05-24 DIAGNOSIS — E162 Hypoglycemia, unspecified: Secondary | ICD-10-CM | POA: Diagnosis not present

## 2021-05-24 DIAGNOSIS — G9340 Encephalopathy, unspecified: Secondary | ICD-10-CM | POA: Diagnosis not present

## 2021-05-24 LAB — BASIC METABOLIC PANEL
Anion gap: 14 (ref 5–15)
Anion gap: 13 (ref 5–15)
BUN: 77 mg/dL — ABNORMAL HIGH (ref 8–23)
BUN: 77 mg/dL — ABNORMAL HIGH (ref 8–23)
CO2: 17 mmol/L — ABNORMAL LOW (ref 22–32)
CO2: 22 mmol/L (ref 22–32)
Calcium: 9.5 mg/dL (ref 8.9–10.3)
Calcium: 9.4 mg/dL (ref 8.9–10.3)
Chloride: 111 mmol/L (ref 98–111)
Chloride: 110 mmol/L (ref 98–111)
Creatinine, Ser: 1.65 mg/dL — ABNORMAL HIGH (ref 0.44–1.00)
Creatinine, Ser: 1.75 mg/dL — ABNORMAL HIGH (ref 0.44–1.00)
GFR, Estimated: 33 mL/min — ABNORMAL LOW (ref >60)
GFR, Estimated: 31 mL/min — ABNORMAL LOW (ref >60)
Glucose, Bld: 189 mg/dL — ABNORMAL HIGH (ref 70–99)
Glucose, Bld: 178 mg/dL — ABNORMAL HIGH (ref 70–99)
Potassium: 6.1 mmol/L — ABNORMAL HIGH (ref 3.5–5.1)
Potassium: 4.6 mmol/L (ref 3.5–5.1)
Sodium: 142 mmol/L (ref 135–145)
Sodium: 145 mmol/L (ref 135–145)

## 2021-05-24 LAB — GLUCOSE, CAPILLARY
Glucose-Capillary: 164 mg/dL — ABNORMAL HIGH (ref 70–99)
Glucose-Capillary: 180 mg/dL — ABNORMAL HIGH (ref 70–99)
Glucose-Capillary: 181 mg/dL — ABNORMAL HIGH (ref 70–99)
Glucose-Capillary: 188 mg/dL — ABNORMAL HIGH (ref 70–99)
Glucose-Capillary: 190 mg/dL — ABNORMAL HIGH (ref 70–99)
Glucose-Capillary: 199 mg/dL — ABNORMAL HIGH (ref 70–99)

## 2021-05-24 MED ORDER — INSULIN ASPART 100 UNIT/ML IJ SOLN
0.0000 [IU] | INTRAMUSCULAR | Status: DC
Start: 2021-05-24 — End: 2021-06-09
  Administered 2021-05-24 – 2021-05-27 (×20): 2 [IU] via SUBCUTANEOUS
  Administered 2021-05-28: 1 [IU] via SUBCUTANEOUS
  Administered 2021-05-28: 2 [IU] via SUBCUTANEOUS
  Administered 2021-05-28: 3 [IU] via SUBCUTANEOUS
  Administered 2021-05-28 (×2): 2 [IU] via SUBCUTANEOUS
  Administered 2021-05-29: 3 [IU] via SUBCUTANEOUS
  Administered 2021-05-29: 1 [IU] via SUBCUTANEOUS
  Administered 2021-05-29 (×2): 2 [IU] via SUBCUTANEOUS
  Administered 2021-05-29 (×2): 1 [IU] via SUBCUTANEOUS
  Administered 2021-05-30 (×3): 2 [IU] via SUBCUTANEOUS
  Administered 2021-05-30: 08:00:00 1 [IU] via SUBCUTANEOUS
  Administered 2021-05-30: 2 [IU] via SUBCUTANEOUS
  Administered 2021-05-30: 16:00:00 1 [IU] via SUBCUTANEOUS
  Administered 2021-05-31 (×3): 2 [IU] via SUBCUTANEOUS
  Administered 2021-05-31: 1 [IU] via SUBCUTANEOUS
  Administered 2021-05-31 (×2): 2 [IU] via SUBCUTANEOUS
  Administered 2021-06-01: 1 [IU] via SUBCUTANEOUS
  Administered 2021-06-01: 3 [IU] via SUBCUTANEOUS
  Administered 2021-06-01: 1 [IU] via SUBCUTANEOUS
  Administered 2021-06-01: 2 [IU] via SUBCUTANEOUS
  Administered 2021-06-01 – 2021-06-02 (×3): 1 [IU] via SUBCUTANEOUS
  Administered 2021-06-02 – 2021-06-05 (×21): 2 [IU] via SUBCUTANEOUS
  Administered 2021-06-05: 1 [IU] via SUBCUTANEOUS
  Administered 2021-06-06 (×5): 2 [IU] via SUBCUTANEOUS
  Administered 2021-06-06 – 2021-06-07 (×2): 1 [IU] via SUBCUTANEOUS
  Administered 2021-06-07 (×3): 2 [IU] via SUBCUTANEOUS
  Administered 2021-06-07: 1 [IU] via SUBCUTANEOUS
  Administered 2021-06-07 – 2021-06-08 (×2): 2 [IU] via SUBCUTANEOUS
  Administered 2021-06-08: 1 [IU] via SUBCUTANEOUS
  Administered 2021-06-08 – 2021-06-09 (×5): 2 [IU] via SUBCUTANEOUS
  Administered 2021-06-09: 04:00:00 1 [IU] via SUBCUTANEOUS
  Administered 2021-06-09: 09:00:00 2 [IU] via SUBCUTANEOUS

## 2021-05-24 MED ORDER — SODIUM BICARBONATE 8.4 % IV SOLN
50.0000 meq | Freq: Once | INTRAVENOUS | Status: AC
  Administered 2021-05-24: 50 meq via INTRAVENOUS
  Filled 2021-05-24: qty 50

## 2021-05-24 MED ORDER — JEVITY 1.5 CAL/FIBER PO LIQD
1000.0000 mL | ORAL | Status: DC
  Administered 2021-05-24 – 2021-06-11 (×20): 1000 mL
  Filled 2021-05-24 (×27): qty 1000

## 2021-05-24 MED ORDER — SODIUM ZIRCONIUM CYCLOSILICATE 10 G PO PACK
10.0000 g | PACK | Freq: Two times a day (BID) | ORAL | Status: AC
  Administered 2021-05-24 (×2): 10 g
  Filled 2021-05-24: qty 1

## 2021-05-24 NOTE — Progress Notes (Signed)
Palliative:  HPI: 70 yo female with past medical history of CKD stage IV, HTN, OSA on CPAP, diabetes, bipolar disorder admitted 05/13/21 with altered mental status and found to be hypoglycemic but no improvement after glucagon or Narcan. Required intubation. Unfortunately no improvement in mental status and concern for hypoglycemic brain injury.   I met today at Kimberly Gross's bedside. She does open her eyes but not to command. She does not track/follow me. She does not blink to visual threat. No family at bedside. I reached out and called [facility] and left voicemail. Kimberly Gross returned my call. We discussed her that her mother's status is unchanged and she is still unable to wean. We discussed poor prognosis yesterday. Kimberly Gross is aware of her decision needing to be made for trach/PEG vs comfort. Kimberly Gross feels the weight of her decision. Discussed fully with [facility] yesterday that trach/PEG will not change/improve her mother's condition but just change out current tubes for longer term tubes but expectation is that her mother would continue in her current condition unfortunately.   Kimberly Gross asks about transfer to [facility]. I again explained to Kimberly Gross that outside hospitals do not accept patients unless they can provide a service that cannot be provided at their current location. Unfortunately there is no more tests or treatments that would be indicated or beneficial for her mother. I did explain that if she or any of her friends or family had any connections with another hospital that I would encourage them to explore and see if they could find a physician to accept her mother's care. Kimberly Gross expresses understanding. She asks about timing of decision. I explained that my understanding is that we need to know by Monday if she desires to pursue trach/PEG or comfort care due to the current endotracheal tube being a temporary measure and cannot be left longer. Kimberly Gross gives no indication of her thoughts or which direction she is  leaning towards.   All questions/concerns addressed to the best of my ability. Emotional support provided. Updated Dr. Tacy Learn and RN Verdene Lennert.   Exam: Opens eyes spontaneously but nothing to command. Does not follow commands. Tolerating vent via ETT. Abd soft. Moves left leg spontaneously per RN but I have not witnessed this movement.   Plan: - Family struggling with poor prognosis and plan to move forward. They are considering trach/PEG vs comfort care.   25 min  Vinie Sill, NP Palliative Medicine Team Pager [phone] (Please see [email or web address] for schedule) Team Phone [phone]    Greater than 50%  of this time was spent counseling and coordinating care related to the above assessment and plan

## 2021-05-24 NOTE — Progress Notes (Signed)
NAME:  Kimberly Gross, MRN:  [id number], DOB:  Apr 09, 1951, LOS: 10 ADMISSION DATE:  05/13/2021, CONSULTATION DATE:  05/13/21 REFERRING MD:  Ronnald Nian - EM, CHIEF COMPLAINT:  AMS    History of Present Illness:  70 yo F PMH CKD IV, DM, bipolar disorder, HTN, Osa on CPAP who was found obtunded in bed by family member 10/31 afternoon found to be severely hypoglycemic which improved with glucagon but no improvement in mental status. Intubated for GCS 3. CT head unremarkable, CBC and CMP also unremarkable.  Pertinent  Medical History  CKD IV Bipolar Disorder DM2  HLD Anxiety HTN OSA on CPAP  Significant Hospital Events: Including procedures, antibiotic start and stop dates in addition to other pertinent events   10/31 Obtunded and hypoglycemic for EMS, mentation did not improve with glucagon. Intubated in ED, CT H non acute. Admitting to PCCM  11/1 LP performed by IR, CSF clear with no organisms on gram stain 11/2 routine EEG read as seizure activity, loaded with levetiracetam 11/3 LTM EEG reviewed, correlating with metabolic encephalopathy rather than epileptiform activity 11/5 no acute events overnight, remains on continuous EEG with no signs of seizures, did not wean on MV 11/7 completed course of ampicillin-sulbactam for aspiration pneumonia  Interim History / Subjective:  Remains obtunded, withdrawing to pain minimally in the lower extremities and right upper extremity, not following commands Afebrile    Objective   Blood pressure (!) 152/63, pulse 68, temperature 99.2 F (37.3 C), temperature source Axillary, resp. rate 17, height 5\' 4"  (1.626 m), weight 110.8 kg, SpO2 98 %.    Vent Mode: PSV;CPAP FiO2 (%):  [30 %] 30 % Set Rate:  [18 bmp] 18 bmp Vt Set:  [440 mL] 440 mL PEEP:  [5 cmH20] 5 cmH20 Pressure Support:  [5 cmH20] 5 cmH20 Plateau Pressure:  [16 cmH20-27 cmH20] 27 cmH20   Intake/Output Summary (Last 24 hours) at 05/24/2021 0818 Last data filed at 05/24/2021  0600 Gross per 24 hour  Intake 3536.85 ml  Output 1630 ml  Net 1906.85 ml    Filed Weights   05/22/21 0002 05/22/21 0416 05/23/21 0228  Weight: 109.6 kg 109.6 kg 110.8 kg    Examination: General:  acute on chronically ill, morbidly obese, appearing elderly female lying in bed in NAD HEENT: MM pink/moist, PERRL, disconjugate gaze shifted to fixed leftward gaze after light shone Neuro:  did not open eyes to command, does not blink to threat, withdraws to pain in lower extremities and right upper extremity CV: RRR, 2/6 systolic murmur PULM:  non labored, CTAB GI: soft, bs+, purewick, flexiseal in place Extremities: warm/dry Skin: no rashes   UOP 1.55L/ 24 hr net +5.9L  Resolved Hospital Problem list   Fever Possible aspiration pneumonia Left hip external rotation - hip fracture ruled out   Assessment & Plan:   Acute metabolic encephalopathy Suspect hypoglycemic encephalopathy from prolonged hypoglycemia Remains obtunded without significant change in mental status CSF studies including HSV PCR negative Repeat MRI negative LTM EEG without epileptiform activity GOC discussions ongoing, will need to decide comfort measures versus trach/PEG soon P: Neuro signed off Maintain neuro protective measures; goal for eurothermia, euglycemia, eunatermia, normoxia, and PCO2 goal of 35-40 Nutrition and bowel regiment  Seizure precautions, EEG d/c'd 11/5 AEDs per neurology, remains on Keppra Aspirations precautions  Supplemental thiamine Still not requiring any sedation Palliative care consult to assist with GOC discussion  Acute respiratory failure with hypoxia In the setting of significantly reduced mentation requiring intubation for airway  support Mental status remains barrier to extubation, may ultimately require tracheostomy pending goals of care Intubated since 10/31 P: Continue MV VAP prevention protocol/ PPI PAD protocol for sedation> not needed  Intermittent  CXR  Left upper extremity edema P: [place] neg for DVT  AKI on CKD IV  Hyperkalemia Metabolic acidosis Potassium 6.1 though hemolysis noted Creatinine at baseline Bicarb steadily dropping P: - Lokelma x2 - 1 amp bicarb Continue purwick, monitor for urinary retention Trend BMP / urinary output Replace electrolytes as indicated Avoid nephrotoxic agents, ensure adequate renal perfusion  DM2  Hypoglycemia -No further hypoglycemia P: Continue SSI sensitive, remains within goal CBG checks every 4 hours Continue tube feeds  Diarrhea Likely from tube feeds P: Rectal tube remains in place  HTN HLD -Home medications include Lasix, Norvasc, aspirin, Lipitor, carvedilol, hydralazine P: Continue ASA, statin, coreg 25mg  BID, amlodipine, hydralazine  Best Practice (right click and "Reselect all SmartList Selections" daily)   Diet/type: tubefeeds and NPO DVT prophylaxis: prophylactic heparin  GI prophylaxis: PPI Lines: N/A Foley:  N/A Code Status:  full code Last date of multidisciplinary goals of care discussion [11/10]  Critical care time:   Zola Button, MD [place], PGY-2  See Amion for pager If no response to pager, please call PCCM consult pager After 7:00 pm call Elink

## 2021-05-24 NOTE — Plan of Care (Signed)
  Problem: Health Behavior/Discharge Planning: Goal: Ability to manage health-related needs will improve Outcome: Not Progressing   Problem: Clinical Measurements: Goal: Will remain free from infection Outcome: Not Progressing Goal: Diagnostic test results will improve Outcome: Not Progressing Goal: Respiratory complications will improve Outcome: Not Progressing Goal: Cardiovascular complication will be avoided Outcome: Not Progressing   Problem: Activity: Goal: Risk for activity intolerance will decrease Outcome: Not Progressing   Problem: Nutrition: Goal: Adequate nutrition will be maintained Outcome: Not Progressing   Problem: Coping: Goal: Level of anxiety will decrease Outcome: Not Progressing   Problem: Elimination: Goal: Will not experience complications related to bowel motility Outcome: Not Progressing Goal: Will not experience complications related to urinary retention Outcome: Not Progressing   Problem: Pain Managment: Goal: General experience of comfort will improve Outcome: Not Progressing   Problem: Safety: Goal: Ability to remain free from injury will improve Outcome: Not Progressing   Problem: Skin Integrity: Goal: Risk for impaired skin integrity will decrease Outcome: Not Progressing   Problem: Activity: Goal: Ability to tolerate increased activity will improve Outcome: Not Progressing   Problem: Respiratory: Goal: Ability to maintain a clear airway and adequate ventilation will improve Outcome: Not Progressing   Problem: Role Relationship: Goal: Method of communication will improve Outcome: Not Progressing

## 2021-05-24 NOTE — Progress Notes (Signed)
Brief Nutrition Note  Received request to adjust tube feeding formula secondary to ongoing diarrhea. Pt currently on Vital 1.5 formula with NutriSource Fiber ordered TID. Pt with 180 ml stool x 24 hours in fecal management system per documentation.  RD will adjust formula to Jevity 1.5 and monitor for tolerance and improvements in diarrhea.  Updated tube feeding regimen: - Jevity 1.5 @ 50 ml/hr (1200 ml/day) - ProSource TF 45 ml TID   Tube feeding regimen provides 1920 kcal, 110 grams of protein, and 912 ml of H2O.    - Continue NutriSource Fiber TID per tube  RD will continue to follow pt during admission.   Gustavus Bryant, MS, RD, LDN Inpatient Clinical Dietitian Please see AMiON for contact information.

## 2021-05-25 DIAGNOSIS — J9601 Acute respiratory failure with hypoxia: Secondary | ICD-10-CM | POA: Diagnosis not present

## 2021-05-25 DIAGNOSIS — Z9911 Dependence on respirator [ventilator] status: Secondary | ICD-10-CM

## 2021-05-25 DIAGNOSIS — G9341 Metabolic encephalopathy: Secondary | ICD-10-CM | POA: Diagnosis not present

## 2021-05-25 LAB — GLUCOSE, CAPILLARY
Glucose-Capillary: 183 mg/dL — ABNORMAL HIGH (ref 70–99)
Glucose-Capillary: 164 mg/dL — ABNORMAL HIGH (ref 70–99)
Glucose-Capillary: 169 mg/dL — ABNORMAL HIGH (ref 70–99)
Glucose-Capillary: 163 mg/dL — ABNORMAL HIGH (ref 70–99)
Glucose-Capillary: 194 mg/dL — ABNORMAL HIGH (ref 70–99)
Glucose-Capillary: 177 mg/dL — ABNORMAL HIGH (ref 70–99)

## 2021-05-25 LAB — BASIC METABOLIC PANEL
Anion gap: 10 (ref 5–15)
BUN: 75 mg/dL — ABNORMAL HIGH (ref 8–23)
CO2: 22 mmol/L (ref 22–32)
Calcium: 9.4 mg/dL (ref 8.9–10.3)
Chloride: 109 mmol/L (ref 98–111)
Creatinine, Ser: 1.8 mg/dL — ABNORMAL HIGH (ref 0.44–1.00)
GFR, Estimated: 30 mL/min — ABNORMAL LOW (ref >60)
Glucose, Bld: 185 mg/dL — ABNORMAL HIGH (ref 70–99)
Potassium: 4.3 mmol/L (ref 3.5–5.1)
Sodium: 141 mmol/L (ref 135–145)

## 2021-05-25 LAB — CBC
HCT: 30.7 % — ABNORMAL LOW (ref 36.0–46.0)
Hemoglobin: 9.2 g/dL — ABNORMAL LOW (ref 12.0–15.0)
MCH: 29.2 pg (ref 26.0–34.0)
MCHC: 30 g/dL (ref 30.0–36.0)
MCV: 97.5 fL (ref 80.0–100.0)
Platelets: 370 10*3/uL (ref 150–400)
RBC: 3.15 MIL/uL — ABNORMAL LOW (ref 3.87–5.11)
RDW: 12.3 % (ref 11.5–15.5)
WBC: 11.6 10*3/uL — ABNORMAL HIGH (ref 4.0–10.5)
nRBC: 0 % (ref 0.0–0.2)

## 2021-05-25 MED ORDER — SODIUM BICARBONATE 650 MG PO TABS
650.0000 mg | ORAL_TABLET | Freq: Once | ORAL | Status: AC
  Administered 2021-05-25: 650 mg
  Filled 2021-05-25: qty 1

## 2021-05-25 MED ORDER — INSULIN GLARGINE-YFGN 100 UNIT/ML ~~LOC~~ SOLN
5.0000 [IU] | Freq: Every day | SUBCUTANEOUS | Status: DC
  Administered 2021-05-25: 5 [IU] via SUBCUTANEOUS
  Filled 2021-05-25 (×2): qty 0.05

## 2021-05-25 NOTE — Progress Notes (Signed)
NAME:  Kimberly Gross, MRN:  [id number], DOB:  July 17, 1950, LOS: 56 ADMISSION DATE:  05/13/2021, CONSULTATION DATE:  05/13/21 REFERRING MD:  Ronnald Nian - EM, CHIEF COMPLAINT:  AMS    History of Present Illness:  70 yo F PMH CKD IV, DM, bipolar disorder, HTN, Osa on CPAP who was found obtunded in bed by family member 10/31 afternoon found to be severely hypoglycemic which improved with glucagon but no improvement in mental status. Intubated for GCS 3. CT head unremarkable, CBC and CMP also unremarkable.  Pertinent  Medical History  CKD IV Bipolar Disorder DM2  HLD Anxiety HTN OSA on CPAP  Significant Hospital Events: Including procedures, antibiotic start and stop dates in addition to other pertinent events   10/31 Obtunded and hypoglycemic for EMS, mentation did not improve with glucagon. Intubated in ED, CT H non acute. Admitting to PCCM  11/1 LP performed by IR, CSF clear with no organisms on gram stain 11/2 routine EEG read as seizure activity, loaded with levetiracetam 11/3 LTM EEG reviewed, correlating with metabolic encephalopathy rather than epileptiform activity 11/5 no acute events overnight, remains on continuous EEG with no signs of seizures, did not wean on MV 11/7 completed course of ampicillin-sulbactam for aspiration pneumonia  Interim History / Subjective:  No change in status this morning. Remains off sedation.   Objective   Blood pressure (!) 158/69, pulse 81, temperature 99.6 F (37.6 C), temperature source Oral, resp. rate (!) 21, height 5\' 4"  (1.626 m), weight 113.2 kg, SpO2 98 %.    Vent Mode: PSV;CPAP FiO2 (%):  [30 %] 30 % Set Rate:  [18 bmp] 18 bmp Vt Set:  [440 mL] 440 mL PEEP:  [5 cmH20] 5 cmH20 Pressure Support:  [8 cmH20-10 cmH20] 10 cmH20 Plateau Pressure:  [27 cmH20] 27 cmH20   Intake/Output Summary (Last 24 hours) at 05/25/2021 0824 Last data filed at 05/25/2021 0600 Gross per 24 hour  Intake 2214.36 ml  Output 1730 ml  Net 484.36 ml     Filed Weights   05/22/21 0416 05/23/21 0228 05/25/21 0500  Weight: 109.6 kg 110.8 kg 113.2 kg    Examination: General:  critically ill appearing woman lying in bed intubated, not sedated HEENT: [place]/AT, eyes anicteric, ETT in place, oral mucosa moist. Neuro:  RASS 0, not tracking, sometimes blinks to threat. Not withdrawing to pain. Intact gag and cough. Breathing above the vent, sometimes spontaneously swallowing. CV: S1S2, RRR PULM: thin ETT secretions, rhonchi cleared with suctioning GI: obese, soft, nontender, nondistended, active bowel sounds Extremities: No clubbing or edema, no cyanosis Skin: Warm, dry, no rashes  WBC 11.6 H/H 9.2/30.7 BUN 75 Cr 1.8 No new culture data  Resolved Hospital Problem list   Fever Possible aspiration pneumonia Left hip external rotation - hip fracture ruled out   Assessment & Plan:   Acute metabolic encephalopathy due to prolonged episode of hypoglycemia No evidence of meningitis or encephalitis-CSF studies including HSV PCR negative.  Brain MRI negative.  EEG without epileptiform activity. -Appreciate neurology's input during this case.  They have signed off. -Continue Keppra twice daily per their recommendations - Continue neuroprotective measures-head of bed.  30 degrees, maintain normal electrolytes, normoglycemia - Continue supportive care.  Ongoing goals of care discussions.  I unfortunately think that her ability to recover to meaningful quality of life is low given her lack of significant progress during her hospital course.  Appreciate palliative care team's assistance.  Acute respiratory failure with hypoxia requiring mechanical ventilation; unable to protect  her airway.  Mental status remains barrier to extubation, may ultimately require tracheostomy pending goals of care Intubated since 10/31 -LTVV, 4 to 8 cc/kg ideal body weight with goal plateau less than 30 and driving pressure less than 15. - Daily SAT and SBT.  Mental  status currently precludes extubation. - VAP prevention protocol  AKI on CKD IV  Hyperkalemia Metabolic acidosis -no lokelma needed today; keep monitoring -can start enteral bicarb -renally dose meds, avoid nephrotoxic meds - Strict I's/O  DM2 , presented with hypoglycemia -No further hypoglycemia -lantus 5 units daily -SSI PRN  Diarrhea, likely from tube feeds -con't rectal tube  HTN HLD -Continue PTA aspirin, statin, Coreg, amlodipine, hydralazine  Acute anemia, likely due to frequent blood draws - Transfuse for hemoglobin less than 7 or hemodynamically significant bleeding - Decrease frequency of lab monitoring  Best Practice (right click and "Reselect all SmartList Selections" daily)   Diet/type: tubefeeds and NPO DVT prophylaxis: prophylactic heparin  GI prophylaxis: PPI Lines: N/A Foley:  N/A Code Status:  full code Last date of multidisciplinary goals of care discussion [11/10]    This patient is critically ill with multiple organ system failure which requires frequent high complexity decision making, assessment, support, evaluation, and titration of therapies. This was completed through the application of advanced monitoring technologies and extensive interpretation of multiple databases. During this encounter critical care time was devoted to patient care services described in this note for 35 minutes.  Julian Hy, DO 05/25/21 8:50 AM [facility]

## 2021-05-26 DIAGNOSIS — Z9911 Dependence on respirator [ventilator] status: Secondary | ICD-10-CM | POA: Diagnosis not present

## 2021-05-26 DIAGNOSIS — Z515 Encounter for palliative care: Secondary | ICD-10-CM | POA: Diagnosis not present

## 2021-05-26 DIAGNOSIS — G9340 Encephalopathy, unspecified: Secondary | ICD-10-CM | POA: Diagnosis not present

## 2021-05-26 DIAGNOSIS — G9341 Metabolic encephalopathy: Secondary | ICD-10-CM | POA: Diagnosis not present

## 2021-05-26 DIAGNOSIS — J9601 Acute respiratory failure with hypoxia: Secondary | ICD-10-CM | POA: Diagnosis not present

## 2021-05-26 DIAGNOSIS — Z7189 Other specified counseling: Secondary | ICD-10-CM | POA: Diagnosis not present

## 2021-05-26 LAB — GLUCOSE, CAPILLARY
Glucose-Capillary: 159 mg/dL — ABNORMAL HIGH (ref 70–99)
Glucose-Capillary: 163 mg/dL — ABNORMAL HIGH (ref 70–99)
Glucose-Capillary: 178 mg/dL — ABNORMAL HIGH (ref 70–99)
Glucose-Capillary: 182 mg/dL — ABNORMAL HIGH (ref 70–99)
Glucose-Capillary: 186 mg/dL — ABNORMAL HIGH (ref 70–99)
Glucose-Capillary: 200 mg/dL — ABNORMAL HIGH (ref 70–99)

## 2021-05-26 MED ORDER — INSULIN GLARGINE-YFGN 100 UNIT/ML ~~LOC~~ SOLN
8.0000 [IU] | Freq: Every day | SUBCUTANEOUS | Status: DC
  Administered 2021-05-26 – 2021-06-09 (×15): 8 [IU] via SUBCUTANEOUS
  Filled 2021-05-26 (×15): qty 0.08

## 2021-05-26 NOTE — Progress Notes (Signed)
[facility] transfer center called back-- CMO Dr. Melodie Bouillon has reviewed case and turned down for transfer.  Julian Hy, DO 05/26/21 12:10 PM [facility]

## 2021-05-26 NOTE — Progress Notes (Signed)
Daughter [name] updated at bedside.  Julian Hy, DO 05/26/21 6:21 PM [facility]

## 2021-05-26 NOTE — Progress Notes (Signed)
Palliative:  HPI: 70 yo female with past medical history of CKD stage IV, HTN, OSA on CPAP, diabetes, bipolar disorder admitted 05/13/21 with altered mental status and found to be hypoglycemic but no improvement after glucagon or Narcan. Required intubation. Unfortunately no improvement in mental status and concern for hypoglycemic brain injury.   I met today at Kimberly Gross's bedside but no family present. RN Shinita faxed info to [facility] and Dr. Carlis Abbott discussed with physician who denied transfer. Kimberly Gross is tolerating some weaning but no improvement in her mental status unfortunately.  I called and spoke with daughter, Kimberly Gross. She had a chance to speak with Dr. Carlis Abbott earlier and I did explain to [facility] that Dr. Carlis Abbott did make effort to have [name] transferred to [facility] and spoke with physician but this request was unfortunately denied transfer. Kimberly Gross became very tearful. She had very much been counting on transfer and different information. I expressed my apologies that we were not able to make a transfer happen. Kimberly Gross shares that she will be at bedside this evening after she is done babysitting. I encouraged her to spend time with her mother. She has no further questions at this time. I explained that I will speak with her again tomorrow.   All questions/concerns addressed. Emotional support provided.   Exam: Opens eyes spontaneously but nothing to command. Does not follow commands. Tolerating vent via ETT. Abd soft. Moves left leg spontaneously per RN but I have not witnessed this movement.   Plan: - Ongoing goals of care conversations. Family struggling with poor prognosis.   [place], NP Palliative Medicine Team Pager [phone] (Please see [email or web address] for schedule) Team Phone [phone]    Greater than 50%  of this time was spent counseling and coordinating care related to the above assessment and plan

## 2021-05-26 NOTE — Progress Notes (Signed)
NAME:  Kimberly Gross, MRN:  [id number], DOB:  04/21/1951, LOS: 71 ADMISSION DATE:  05/13/2021, CONSULTATION DATE:  05/13/21 REFERRING MD:  Ronnald Nian - EM, CHIEF COMPLAINT:  AMS    History of Present Illness:  70 yo F PMH CKD IV, DM, bipolar disorder, HTN, Osa on CPAP who was found obtunded in bed by family member 10/31 afternoon found to be severely hypoglycemic which improved with glucagon but no improvement in mental status. Intubated for GCS 3. CT head unremarkable, CBC and CMP also unremarkable.  Pertinent  Medical History  CKD IV Bipolar Disorder DM2  HLD Anxiety HTN OSA on CPAP  Significant Hospital Events: Including procedures, antibiotic start and stop dates in addition to other pertinent events   10/31 Obtunded and hypoglycemic for EMS, mentation did not improve with glucagon. Intubated in ED, CT H non acute. Admitting to PCCM  11/1 LP performed by IR, CSF clear with no organisms on gram stain 11/2 routine EEG read as seizure activity, loaded with levetiracetam 11/3 LTM EEG reviewed, correlating with metabolic encephalopathy rather than epileptiform activity 11/5 no acute events overnight, remains on continuous EEG with no signs of seizures, did not wean on MV 11/7 completed course of ampicillin-sulbactam for aspiration pneumonia  Interim History / Subjective:  No acute events overnight. Afebrile.  Objective   Blood pressure (!) 156/73, pulse 76, temperature 99.2 F (37.3 C), temperature source Axillary, resp. rate 20, height 5\' 4"  (1.626 m), weight 113.2 kg, SpO2 98 %.    Vent Mode: PRVC FiO2 (%):  [30 %] 30 % Set Rate:  [18 bmp] 18 bmp Vt Set:  [440 mL] 440 mL PEEP:  [5 cmH20] 5 cmH20 Pressure Support:  [10 cmH20] 10 cmH20 Plateau Pressure:  [23 cmH20] 23 cmH20   Intake/Output Summary (Last 24 hours) at 05/26/2021 0743 Last data filed at 05/26/2021 0600 Gross per 24 hour  Intake 3837.59 ml  Output 1525 ml  Net 2312.59 ml    Filed Weights   05/22/21 0416  05/23/21 0228 05/25/21 0500  Weight: 109.6 kg 110.8 kg 113.2 kg    Examination: General:  critically ill appearing woman lying in bed in NAD HEENT: [place]/AT, eyes anicteric, ETT in place, oral mucosa moist. Neuro:  RASS 0, not tracking. Sometimes blinks to threat-delayed a few seconds. Not withdrawing from pain x 4 extremities. Intact cough and gag reflex. Sometimes spontaneously swallows. CV: S1S2, RRR PULM: minimal thin ETT secretions, distant lung sounds, CTA GI: obese, soft, NT. FMS in place with [place] liquid stool. Extremities: No cyanosis or clubbing, no edema. Skin: warm, dry, no rashes  BGs 160-190s No new labs today.  Resolved Hospital Problem list   Fever Possible aspiration pneumonia Left hip external rotation - hip fracture ruled out   Assessment & Plan:   Acute metabolic encephalopathy due to prolonged episode of hypoglycemia No evidence of meningitis or encephalitis-CSF studies including HSV PCR negative.  Brain MRI negative.  EEG without epileptiform activity. It does not appear that she has had significant improvement in her responsiveness. -Appreciate neurology's input during this case.  They have signed off. -Con't keppra BID per neurology's recommendations -Con't neuroprotective measures. -Con't supportive care. Mental status precludes extubation. Ongoing GOC discussions needed. Family wants her transferred to another facility.  I do not think her prognosis would change at another facility. I am concerned that she will not return to her previous functional status.  -Appreciate Palliative Care team's assistance  Acute respiratory failure with hypoxia requiring mechanical ventilation; unable  to protect her airway.  Mental status remains barrier to extubation. Would ultimately require tracheostomy if family wishes to pursue long-term aggressive care. Intubated since 10/31. -LTVV, 4-8cc/kg IBW with goal Pplat <30 and DP<15. -VAP prevention protocol -no need for  sedation -Daily SBT. Mental status precludes extubation.  AKI on CKD IV  Hyperkalemia Metabolic acidosis -recheck electrolytes tomorrow -can start enteral bicarb if dropping again -renally dose meds, avoid nephrotoxic medications - Strict I/O  DM2 , presented with hypoglycemia. Uncontrolled hyperglycemia. No further hypoglycemic episodes. -lantus increased to 8 units daily -con't SSI PRN  Diarrhea, likely from tube feeds -con't rectal tube  HTN HLD -Con't ASA, statin, amlodipine, hydralazine, coreg  Acute anemia, likely due to frequent blood draws -transfuse for Hb<7 or hemodynamically significant bleeding -have decreased the frequency of her lab monitoring  I called daughter [name] to give an update. She wants to try to get her mother transferred to [facility]. We discussed that there is no medical indication for transfer, but I am happy to attempt. I explained that without a medical indication for transfer, we are unlikely to be successful in finding an accepting physician.  I called [facility] transfer center and a facesheet has been faxed to [facility]. Awaiting a call back to speak to a physician.    Best Practice (right click and "Reselect all SmartList Selections" daily)   Diet/type: tubefeeds and NPO DVT prophylaxis: prophylactic heparin  GI prophylaxis: PPI Lines: N/A Foley:  N/A Code Status:  full code Last date of multidisciplinary goals of care discussion [11/10]    This patient is critically ill with multiple organ system failure which requires frequent high complexity decision making, assessment, support, evaluation, and titration of therapies. This was completed through the application of advanced monitoring technologies and extensive interpretation of multiple databases. During this encounter critical care time was devoted to patient care services described in this note for 42 minutes.  Julian Hy, DO 05/26/21 11:44 AM [place] [facility]

## 2021-05-27 DIAGNOSIS — E162 Hypoglycemia, unspecified: Secondary | ICD-10-CM | POA: Diagnosis not present

## 2021-05-27 DIAGNOSIS — R4182 Altered mental status, unspecified: Secondary | ICD-10-CM | POA: Diagnosis not present

## 2021-05-27 DIAGNOSIS — G9340 Encephalopathy, unspecified: Secondary | ICD-10-CM | POA: Diagnosis not present

## 2021-05-27 DIAGNOSIS — J9601 Acute respiratory failure with hypoxia: Secondary | ICD-10-CM | POA: Diagnosis not present

## 2021-05-27 LAB — CBC
HCT: 32 % — ABNORMAL LOW (ref 36.0–46.0)
Hemoglobin: 10 g/dL — ABNORMAL LOW (ref 12.0–15.0)
MCH: 29.1 pg (ref 26.0–34.0)
MCHC: 31.3 g/dL (ref 30.0–36.0)
MCV: 93 fL (ref 80.0–100.0)
Platelets: 412 10*3/uL — ABNORMAL HIGH (ref 150–400)
RBC: 3.44 MIL/uL — ABNORMAL LOW (ref 3.87–5.11)
RDW: 12.2 % (ref 11.5–15.5)
WBC: 13 10*3/uL — ABNORMAL HIGH (ref 4.0–10.5)
nRBC: 0 % (ref 0.0–0.2)

## 2021-05-27 LAB — GLUCOSE, CAPILLARY
Glucose-Capillary: 160 mg/dL — ABNORMAL HIGH (ref 70–99)
Glucose-Capillary: 153 mg/dL — ABNORMAL HIGH (ref 70–99)
Glucose-Capillary: 155 mg/dL — ABNORMAL HIGH (ref 70–99)
Glucose-Capillary: 161 mg/dL — ABNORMAL HIGH (ref 70–99)
Glucose-Capillary: 180 mg/dL — ABNORMAL HIGH (ref 70–99)
Glucose-Capillary: 220 mg/dL — ABNORMAL HIGH (ref 70–99)

## 2021-05-27 LAB — BASIC METABOLIC PANEL
Anion gap: 8 (ref 5–15)
BUN: 74 mg/dL — ABNORMAL HIGH (ref 8–23)
CO2: 23 mmol/L (ref 22–32)
Calcium: 9.5 mg/dL (ref 8.9–10.3)
Chloride: 109 mmol/L (ref 98–111)
Creatinine, Ser: 1.67 mg/dL — ABNORMAL HIGH (ref 0.44–1.00)
GFR, Estimated: 33 mL/min — ABNORMAL LOW (ref >60)
Glucose, Bld: 171 mg/dL — ABNORMAL HIGH (ref 70–99)
Potassium: 4.7 mmol/L (ref 3.5–5.1)
Sodium: 140 mmol/L (ref 135–145)

## 2021-05-27 MED ORDER — HYDROCHLOROTHIAZIDE 12.5 MG PO TABS
12.5000 mg | ORAL_TABLET | Freq: Every day | ORAL | Status: DC
  Administered 2021-05-27 – 2021-05-28 (×2): 12.5 mg
  Filled 2021-05-27 (×2): qty 1

## 2021-05-27 NOTE — Progress Notes (Signed)
NAME:  MAHOGANIE BASHER, MRN:  [id number], DOB:  1950/10/14, LOS: 57 ADMISSION DATE:  05/13/2021, CONSULTATION DATE:  05/13/21 REFERRING MD:  Ronnald Nian - EM, CHIEF COMPLAINT:  AMS    History of Present Illness:  70 yo F PMH CKD IV, DM, bipolar disorder, HTN, Osa on CPAP who was found obtunded in bed by family member 10/31 afternoon found to be severely hypoglycemic which improved with glucagon but no improvement in mental status. Intubated for GCS 3. CT head unremarkable, CBC and CMP also unremarkable.  Pertinent  Medical History  CKD IV Bipolar Disorder DM2  HLD Anxiety HTN OSA on CPAP  Significant Hospital Events: Including procedures, antibiotic start and stop dates in addition to other pertinent events   10/31 Obtunded and hypoglycemic for EMS, mentation did not improve with glucagon. Intubated in ED, CT H non acute. Admitting to PCCM  11/1 LP performed by IR, CSF clear with no organisms on gram stain 11/2 routine EEG read as seizure activity, loaded with levetiracetam 11/3 LTM EEG reviewed, correlating with metabolic encephalopathy rather than epileptiform activity 11/5 no acute events overnight, remains on continuous EEG with no signs of seizures, did not wean on MV 11/7 completed course of ampicillin-sulbactam for aspiration pneumonia  Interim History / Subjective:  No acute events overnight. Afebrile.  Objective   Blood pressure (!) 149/67, pulse 77, temperature 99.9 F (37.7 C), temperature source Axillary, resp. rate 20, height 5\' 4"  (1.626 m), weight 115.7 kg, SpO2 98 %.    Vent Mode: PSV;CPAP FiO2 (%):  [30 %] 30 % Set Rate:  [18 bmp] 18 bmp Vt Set:  [440 mL] 440 mL PEEP:  [5 cmH20] 5 cmH20 Pressure Support:  [8 cmH20] 8 cmH20 Plateau Pressure:  [11 cmH20-15 cmH20] 11 cmH20   Intake/Output Summary (Last 24 hours) at 05/27/2021 0929 Last data filed at 05/27/2021 0800 Gross per 24 hour  Intake 2519.96 ml  Output 1125 ml  Net 1394.96 ml    Filed Weights    05/23/21 0228 05/25/21 0500 05/27/21 0417  Weight: 110.8 kg 113.2 kg 115.7 kg    Examination: General:  critically ill appearing woman lying in bed in NAD HEENT: [place]/AT, eyes anicteric, ETT in place, oral mucosa moist. Neuro:  Intermittently opens eyes to voice, minimal withdrawal to pain in the lower extremities CV: RRR, 2/6 systolic murmurs PULM: CTAB, no respiratory distress GI: obese, soft, NT, +BS Extremities: No cyanosis or clubbing, 1+ pitting edema lower extremities. Skin: warm, dry, no rashes  BGs 150-200 Cr 1.67 around baseline K 4.7 Mild leukocytosis Mild thrombocytosis Stable anemia  Resolved Hospital Problem list   Fever Possible aspiration pneumonia Left hip external rotation - hip fracture ruled out   Assessment & Plan:   Acute metabolic encephalopathy due to prolonged episode of hypoglycemia No evidence of meningitis or encephalitis-CSF studies including HSV PCR negative.  Brain MRI negative.  EEG without epileptiform activity. It does not appear that she has had significant improvement in her responsiveness. -Appreciate neurology's input during this case.  They have signed off. -Con't keppra BID per neurology's recommendations -Con't neuroprotective measures. -Con't supportive care. Mental status precludes extubation. Ongoing [place] discussions needed -[place] team's assistance  Acute respiratory failure with hypoxia requiring mechanical ventilation; unable to protect her airway.  Mental status remains barrier to extubation. Would ultimately require tracheostomy if family wishes to pursue long-term aggressive care. Intubated since 10/31. -LTVV, 4-8cc/kg IBW with goal Pplat <30 and DP<15. -VAP prevention protocol -no need for sedation -  Daily SBT. Mental status precludes extubation.  AKI on CKD IV  Hyperkalemia Metabolic acidosis -renally dose meds, avoid nephrotoxic medications - Strict I/O  DM2 , presented with hypoglycemia.  Uncontrolled hyperglycemia. No further hypoglycemic episodes. -lantus 8 units daily -con't SSI PRN  Diarrhea, likely from tube feeds -con't rectal tube -tube feeds per RD, fiber added  HTN, uncontrolled HLD -Con't ASA, statin, amlodipine, hydralazine, coreg -start HCTZ  Acute anemia, likely due to frequent blood draws -transfuse for Hb<7 or hemodynamically significant bleeding -have decreased the frequency of her lab monitoring  Goals of care Family has been wanting transfer to another facility. Do not feel there is a medical reason for transfer. Transfer was discussed with [facility] transfer center over the weekend which was turned down. Daughter has not been able to make a decision so far regarding goals of care. - palliative care following, appreciate assistance - ethics consult   Best Practice (right click and "Reselect all SmartList Selections" daily)   Diet/type: tubefeeds and NPO DVT prophylaxis: prophylactic heparin  GI prophylaxis: PPI Lines: N/A Foley:  N/A Code Status:  full code Last date of multidisciplinary goals of care discussion [11/10]    Zola Button, MD [place], PGY-2

## 2021-05-27 NOTE — Progress Notes (Signed)
Palliative:  I met today at Ms. Mohl's bedside but no family present. Discussed with Dr. Tacy Learn and RN. I reached out to [facility] x2 and left voicemail but no return call received. I sense that she is not prepared to make a decision. If she needs more time this is completely understandable but Ms. Putman will need to transition from ETT to trach so as not to cause any further damage to airway/vocal cords/etc. If [name] decided down the road that comfort is the best option and her mother's quality of life is not acceptable then we can always make this transition from trach as well. Asked RN to discuss further with [name] is she hears from her.   No charge  Vinie Sill, NP Palliative Medicine Team Pager [phone] (Please see [email or web address] for schedule) Team Phone [phone]

## 2021-05-28 ENCOUNTER — Ambulatory Visit: Payer: Medicare (Managed Care)

## 2021-05-28 ENCOUNTER — Inpatient Hospital Stay (HOSPITAL_COMMUNITY): Payer: Medicare (Managed Care)

## 2021-05-28 LAB — GLUCOSE, CAPILLARY
Glucose-Capillary: 176 mg/dL — ABNORMAL HIGH (ref 70–99)
Glucose-Capillary: 156 mg/dL — ABNORMAL HIGH (ref 70–99)
Glucose-Capillary: 169 mg/dL — ABNORMAL HIGH (ref 70–99)
Glucose-Capillary: 115 mg/dL — ABNORMAL HIGH (ref 70–99)
Glucose-Capillary: 148 mg/dL — ABNORMAL HIGH (ref 70–99)
Glucose-Capillary: 118 mg/dL — ABNORMAL HIGH (ref 70–99)

## 2021-05-28 IMAGING — DX DG CHEST 1V PORT
1 series · 1 of 1 positions shown · non-contrast
Comparison: Chest x-ray dated [DATE]

CLINICAL DATA: Tracheostomy

EXAM:
PORTABLE CHEST 1 VIEW

[chest ap]
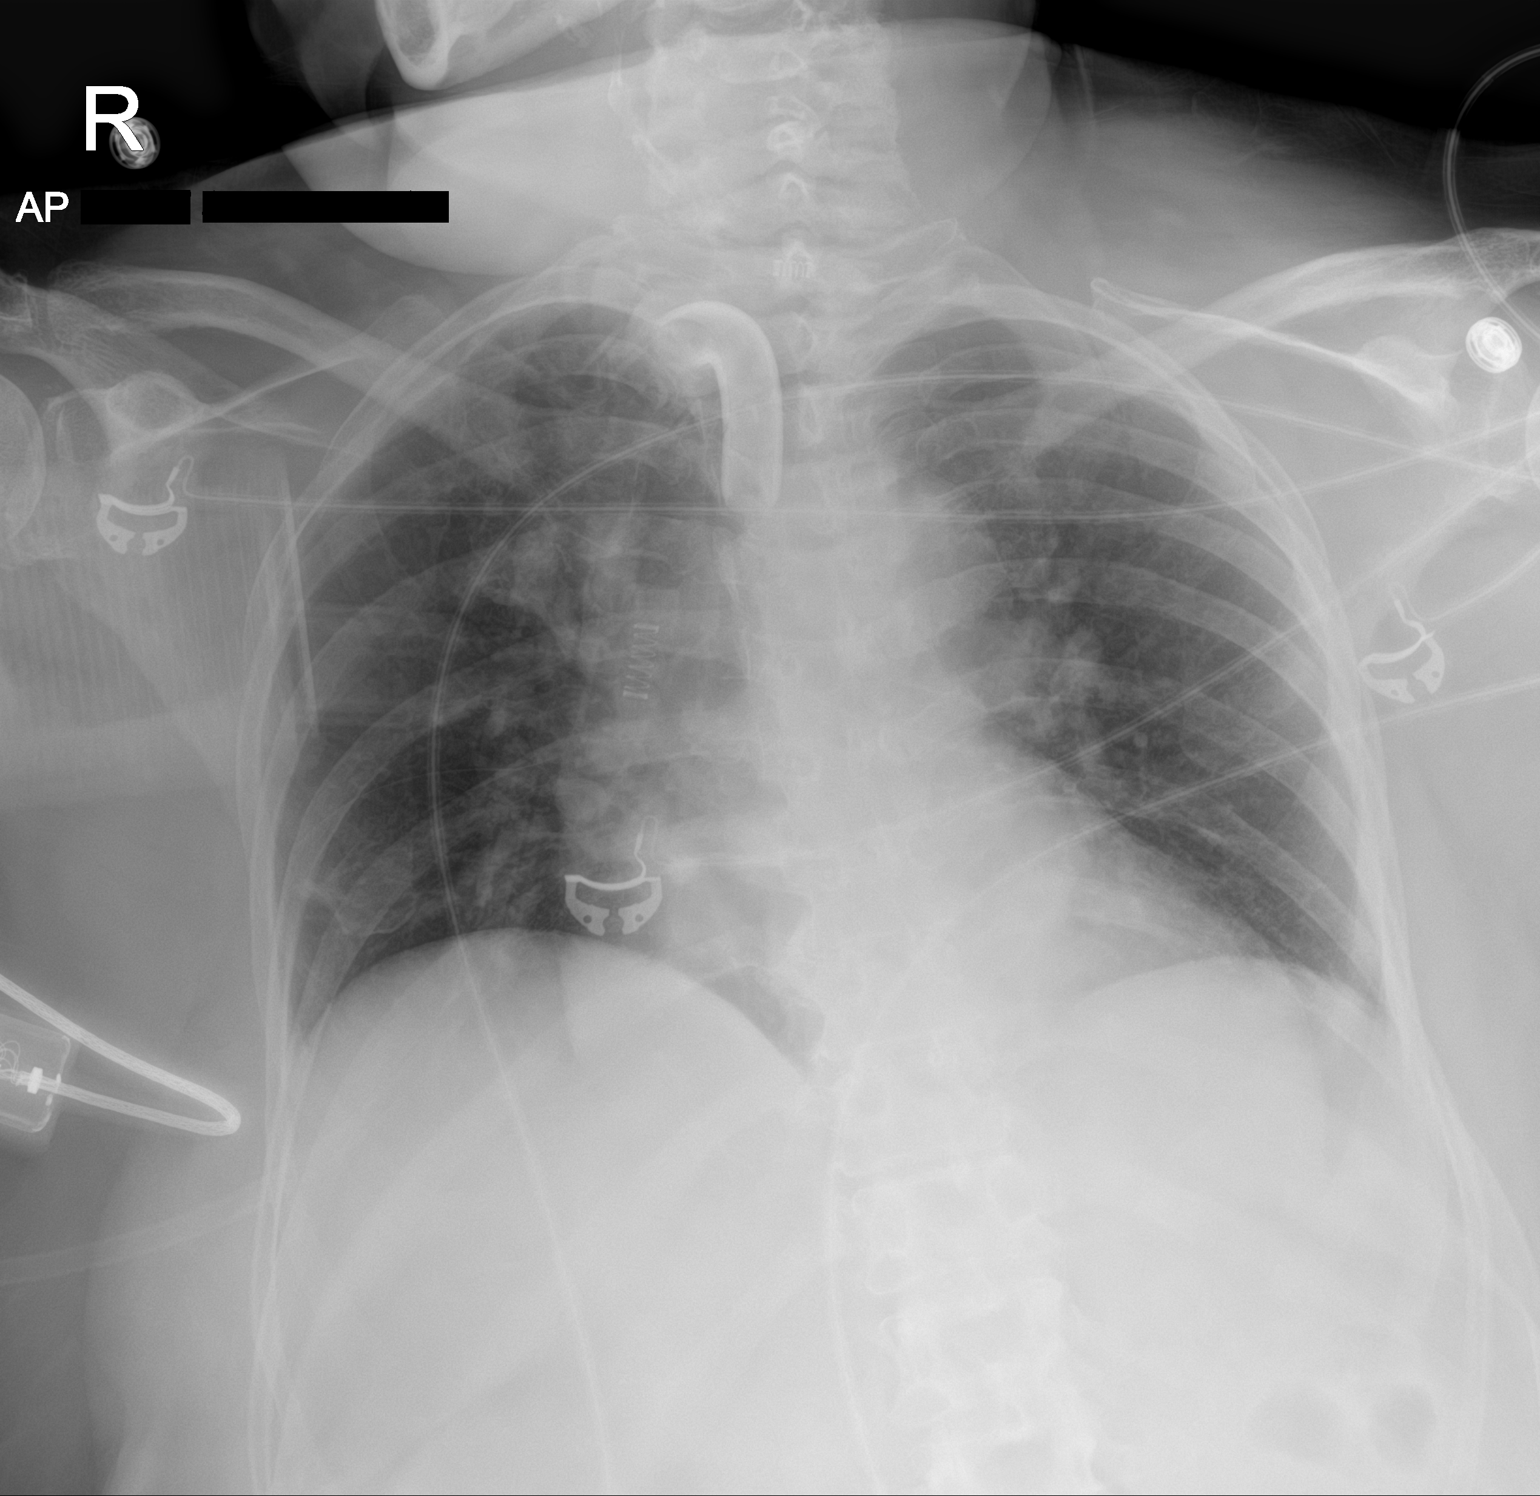

[1 of 1 positions shown; findings below may reference images not displayed]

FINDINGS: Interval removal of ET tube and placement of tracheostomy tube.
Interval removal of NG tube. Low lung volumes with hypoventilatory
changes. No large pleural effusion or evidence of pneumothorax.
Cardiac and mediastinal contours are unchanged.
IMPRESSION: Interval placement of tracheostomy tube which appears appropriately
positioned.

## 2021-05-28 MED ORDER — VECURONIUM BROMIDE 10 MG IV SOLR
10.0000 mg | Freq: Once | INTRAVENOUS | Status: AC
  Administered 2021-05-28: 10 mg via INTRAVENOUS
  Filled 2021-05-28: qty 10

## 2021-05-28 MED ORDER — LEVETIRACETAM IN NACL 500 MG/100ML IV SOLN
500.0000 mg | Freq: Two times a day (BID) | INTRAVENOUS | Status: DC
  Administered 2021-05-28 – 2021-05-29 (×3): 500 mg via INTRAVENOUS
  Filled 2021-05-28 (×3): qty 100

## 2021-05-28 MED ORDER — ETOMIDATE 2 MG/ML IV SOLN
20.0000 mg | Freq: Once | INTRAVENOUS | Status: AC
  Administered 2021-05-28: 20 mg via INTRAVENOUS
  Filled 2021-05-28: qty 10

## 2021-05-28 MED ORDER — LABETALOL HCL 5 MG/ML IV SOLN
10.0000 mg | Freq: Four times a day (QID) | INTRAVENOUS | Status: DC | PRN
Start: 2021-05-28 — End: 2021-06-13

## 2021-05-28 MED ORDER — "THROMBI-PAD 3""X3"" EX PADS"
1.0000 | MEDICATED_PAD | Freq: Once | CUTANEOUS | Status: DC
  Filled 2021-05-28: qty 1

## 2021-05-28 MED ORDER — HYDROCHLOROTHIAZIDE 25 MG PO TABS
25.0000 mg | ORAL_TABLET | Freq: Every day | ORAL | Status: DC
Start: 2021-05-29 — End: 2021-06-09
  Administered 2021-05-29 – 2021-06-09 (×12): 25 mg
  Filled 2021-05-28 (×12): qty 1

## 2021-05-28 MED ORDER — FENTANYL CITRATE (PF) 100 MCG/2ML IJ SOLN
100.0000 ug | Freq: Once | INTRAMUSCULAR | Status: AC
  Administered 2021-05-28: 100 ug via INTRAVENOUS
  Filled 2021-05-28: qty 2

## 2021-05-28 MED ORDER — HYDRALAZINE HCL 20 MG/ML IJ SOLN
10.0000 mg | Freq: Four times a day (QID) | INTRAMUSCULAR | Status: DC
  Administered 2021-05-28 – 2021-05-29 (×4): 10 mg via INTRAVENOUS
  Filled 2021-05-28 (×4): qty 1

## 2021-05-28 MED ORDER — MIDAZOLAM HCL 2 MG/2ML IJ SOLN
4.0000 mg | Freq: Once | INTRAMUSCULAR | Status: AC
Start: 2021-05-28 — End: 2021-05-28
  Administered 2021-05-28: 4 mg via INTRAVENOUS
  Filled 2021-05-28: qty 4

## 2021-05-28 NOTE — Procedures (Signed)
Diagnostic Bronchoscopy  Kimberly Gross  [id number]  [birth year]  Date:05/28/21  Time:2:49 PM   Provider Performing:Enijah Furr   Procedure: Diagnostic Bronchoscopy [phone])  Indication(s) Assist with direct visualization of tracheostomy placement  Consent Risks of the procedure as well as the alternatives and risks of each were explained to the patient and/or caregiver.  Consent for the procedure was obtained.   Anesthesia See separate tracheostomy note   Time Out Verified patient identification, verified procedure, site/side was marked, verified correct patient position, special equipment/implants available, medications/allergies/relevant history reviewed, required imaging and test results available.   Sterile Technique Usual hand hygiene, masks, gowns, and gloves were used   Procedure Description Bronchoscope advanced through endotracheal tube and into airway.  After suctioning out tracheal secretions, bronchoscope used to provide direct visualization of tracheostomy placement.   Complications/Tolerance None; patient tolerated the procedure well.   EBL None  Specimen(s) None

## 2021-05-28 NOTE — Procedures (Signed)
Percutaneous Tracheostomy Procedure Note   HILARY MILKS  [id number]  1951-01-07  Date:05/28/21  Time:2:54 PM   Provider Performing:Reshonda Koerber  Procedure: Percutaneous Tracheostomy with Bronchoscopic Guidance (31600)  Indication(s) Acute respiratory failure  Consent Risks of the procedure as well as the alternatives and risks of each were explained to the patient and/or caregiver.  Consent for the procedure was obtained.  Anesthesia Etomidate, Versed, Fentanyl, Vecuronium   Time Out Verified patient identification, verified procedure, site/side was marked, verified correct patient position, special equipment/implants available, medications/allergies/relevant history reviewed, required imaging and test results available.   Sterile Technique Maximal sterile technique including sterile barrier drape, hand hygiene, sterile gown, sterile gloves, mask, hair covering.    Procedure Description Appropriate anatomy identified by palpation.  Patient's neck prepped and draped in sterile fashion.  1% lidocaine with epinephrine was used to anesthetize skin overlying neck.  1.5cm incision made and blunt dissection performed until tracheal rings could be easily palpated.   Then a size 8 Shiley tracheostomy was placed under bronchoscopic visualization using usual Seldinger technique and serial dilation.   Bronchoscope confirmed placement above the carina.  Tracheostomy was sutured in place with adhesive pad to protect skin under pressure.    Patient connected to ventilator.   Complications/Tolerance None; patient tolerated the procedure well. Chest X-ray is ordered to confirm no post-procedural complication.   EBL Minimal   Specimen(s) None

## 2021-05-28 NOTE — Plan of Care (Signed)
S/P tracheostomy today. See flowsheet for neuro exam.   Problem: Health Behavior/Discharge Planning: Goal: Ability to manage health-related needs will improve Outcome: Not Progressing   Problem: Clinical Measurements: Goal: Will remain free from infection Outcome: Not Progressing Goal: Diagnostic test results will improve Outcome: Not Progressing Goal: Respiratory complications will improve Outcome: Not Progressing Goal: Cardiovascular complication will be avoided Outcome: Not Progressing   Problem: Activity: Goal: Risk for activity intolerance will decrease Outcome: Not Progressing   Problem: Nutrition: Goal: Adequate nutrition will be maintained Outcome: Not Progressing   Problem: Coping: Goal: Level of anxiety will decrease Outcome: Not Progressing   Problem: Elimination: Goal: Will not experience complications related to bowel motility Outcome: Not Progressing Goal: Will not experience complications related to urinary retention Outcome: Not Progressing   Problem: Pain Managment: Goal: General experience of comfort will improve Outcome: Not Progressing   Problem: Safety: Goal: Ability to remain free from injury will improve Outcome: Not Progressing   Problem: Skin Integrity: Goal: Risk for impaired skin integrity will decrease Outcome: Not Progressing   Problem: Activity: Goal: Ability to tolerate increased activity will improve Outcome: Not Progressing   Problem: Respiratory: Goal: Ability to maintain a clear airway and adequate ventilation will improve Outcome: Not Progressing   Problem: Role Relationship: Goal: Method of communication will improve Outcome: Not Progressing

## 2021-05-28 NOTE — Progress Notes (Signed)
NAME:  Kimberly Gross, MRN:  [id number], DOB:  [birth year], LOS: 74 ADMISSION DATE:  05/13/2021, CONSULTATION DATE:  05/13/21 REFERRING MD:  Ronnald Nian - EM, CHIEF COMPLAINT:  AMS    History of Present Illness:  70 yo F PMH CKD IV, DM, bipolar disorder, HTN, Osa on CPAP who was found obtunded in bed by family member 10/31 afternoon found to be severely hypoglycemic which improved with glucagon but no improvement in mental status. Intubated for GCS 3. CT head unremarkable, CBC and CMP also unremarkable.  Pertinent  Medical History  CKD IV Bipolar Disorder DM2  HLD Anxiety HTN OSA on CPAP  Significant Hospital Events: Including procedures, antibiotic start and stop dates in addition to other pertinent events   10/31 Obtunded and hypoglycemic for EMS, mentation did not improve with glucagon. Intubated in ED, CT H non acute. Admitting to PCCM  11/1 LP performed by IR, CSF clear with no organisms on gram stain 11/2 routine EEG read as seizure activity, loaded with levetiracetam 11/3 LTM EEG reviewed, correlating with metabolic encephalopathy rather than epileptiform activity 11/5 no acute events overnight, remains on continuous EEG with no signs of seizures, did not wean on MV 11/7 completed course of ampicillin-sulbactam for aspiration pneumonia  Interim History / Subjective:  No acute events overnight. Afebrile. Remains obtunded, no significant change Daughter unable to be reached by phone yesterday  Objective   Blood pressure (!) 153/62, pulse 80, temperature 99.5 F (37.5 C), temperature source Axillary, resp. rate (!) 23, height 5\' 4"  (1.626 m), weight 115.7 kg, SpO2 99 %.    Vent Mode: PRVC FiO2 (%):  [30 %] 30 % Set Rate:  [18 bmp] 18 bmp Vt Set:  [400 mL-440 mL] 400 mL PEEP:  [5 cmH20] 5 cmH20 Pressure Support:  [8 cmH20] 8 cmH20 Plateau Pressure:  [13 cmH20-17 cmH20] 13 cmH20   Intake/Output Summary (Last 24 hours) at 05/28/2021 0805 Last data filed at 05/28/2021  0500 Gross per 24 hour  Intake 2041.52 ml  Output 1880 ml  Net 161.52 ml    Filed Weights   05/23/21 0228 05/25/21 0500 05/27/21 0417  Weight: 110.8 kg 113.2 kg 115.7 kg    Examination: General:  critically ill appearing woman lying in bed in NAD HEENT: [place]/AT, PERRL, eyes anicteric, ETT in place, oral mucosa moist. Neuro:  Does not awaken to voice, does not blink to threat, minimal withdrawal to pain in the lower extremities only CV: RRR, 2/6 systolic murmur PULM: CTAB, no respiratory distress GI: obese, soft, NT, +BS Extremities: No cyanosis or clubbing, no edema Skin: warm, dry, no rashes  CBG 156-220  Resolved Hospital Problem list   Fever Possible aspiration pneumonia Left hip external rotation - hip fracture ruled out Hyperkalemia Non-gap metabolic acidosis  Assessment & Plan:   Acute metabolic encephalopathy due to prolonged episode of hypoglycemia No evidence of meningitis or encephalitis-CSF studies including HSV PCR negative.  Brain MRI negative.  EEG without epileptiform activity. It does not appear that she has had significant improvement in her responsiveness. -Appreciate neurology's input during this case.  They have signed off. -Con't keppra BID per neurology's recommendations -Con't neuroprotective measures. -Con't supportive care. Mental status precludes extubation. Ongoing [place] discussions needed -[place] team's assistance  Acute respiratory failure with hypoxia requiring mechanical ventilation Mental status remains barrier to extubation.  Intubated since 10/31, will need to make decision for tracheostomy or one-way extubation as soon as possible if not today -LTVV, 4-8cc/kg IBW with goal Pplat <  30 and DP<15. -VAP prevention protocol -no need for sedation -Daily SBT. Mental status precludes extubation.  CKD IV  -renally dose meds, avoid nephrotoxic medications - Strict I/O  DM2 , presented with hypoglycemia No further  hypoglycemic episodes. -lantus 8 units daily -con't SSI PRN  Diarrhea likely from tube feeds -con't rectal tube -tube feeds per RD, fiber added  HTN HLD BP adequately controlled, no change today -Con't ASA, statin, amlodipine, hydralazine, coreg -HCTZ  Acute anemia, likely due to frequent blood draws -transfuse for Hb<7 or hemodynamically significant bleeding -have decreased the frequency of her lab monitoring  Goals of care Family has been wanting transfer to another facility. Do not feel there is a medical reason for transfer. Transfer was discussed with [facility] transfer center over the weekend which was turned down. Daughter has not been able to make a decision so far regarding goals of care. - palliative care following, appreciate assistance - ethics consult   Best Practice (right click and "Reselect all SmartList Selections" daily)   Diet/type: tubefeeds and NPO DVT prophylaxis: prophylactic heparin  GI prophylaxis: PPI Lines: N/A Foley:  N/A Code Status:  full code Last date of multidisciplinary goals of care discussion [11/14]    Zola Button, MD [place], PGY-2

## 2021-05-29 ENCOUNTER — Inpatient Hospital Stay (HOSPITAL_COMMUNITY): Payer: Medicare (Managed Care)

## 2021-05-29 LAB — GLUCOSE, CAPILLARY
Glucose-Capillary: 139 mg/dL — ABNORMAL HIGH (ref 70–99)
Glucose-Capillary: 139 mg/dL — ABNORMAL HIGH (ref 70–99)
Glucose-Capillary: 160 mg/dL — ABNORMAL HIGH (ref 70–99)
Glucose-Capillary: 149 mg/dL — ABNORMAL HIGH (ref 70–99)
Glucose-Capillary: 176 mg/dL — ABNORMAL HIGH (ref 70–99)
Glucose-Capillary: 184 mg/dL — ABNORMAL HIGH (ref 70–99)

## 2021-05-29 LAB — BASIC METABOLIC PANEL
Anion gap: 12 (ref 5–15)
BUN: 75 mg/dL — ABNORMAL HIGH (ref 8–23)
CO2: 23 mmol/L (ref 22–32)
Calcium: 10 mg/dL (ref 8.9–10.3)
Chloride: 111 mmol/L (ref 98–111)
Creatinine, Ser: 1.6 mg/dL — ABNORMAL HIGH (ref 0.44–1.00)
GFR, Estimated: 34 mL/min — ABNORMAL LOW (ref >60)
Glucose, Bld: 148 mg/dL — ABNORMAL HIGH (ref 70–99)
Potassium: 4.5 mmol/L (ref 3.5–5.1)
Sodium: 146 mmol/L — ABNORMAL HIGH (ref 135–145)

## 2021-05-29 MED ORDER — HEPARIN SODIUM (PORCINE) 5000 UNIT/ML IJ SOLN
5000.0000 [IU] | Freq: Three times a day (TID) | INTRAMUSCULAR | Status: AC
  Administered 2021-05-30 – 2021-06-02 (×11): 5000 [IU] via SUBCUTANEOUS
  Filled 2021-05-29 (×11): qty 1

## 2021-05-29 MED ORDER — HYDRALAZINE HCL 50 MG PO TABS
100.0000 mg | ORAL_TABLET | Freq: Three times a day (TID) | ORAL | Status: DC
  Administered 2021-05-29: 100 mg
  Filled 2021-05-29 (×4): qty 2

## 2021-05-29 MED ORDER — FREE WATER
200.0000 mL | Freq: Four times a day (QID) | Status: DC
Start: 2021-05-29 — End: 2021-06-09
  Administered 2021-05-29 – 2021-06-09 (×45): 200 mL

## 2021-05-29 NOTE — Progress Notes (Signed)
SLP Cancellation Note  Patient Details Name: [name] MRN: [id number] DOB: [birth year]   Cancelled treatment:       Reason Eval/Treat Not Completed: Patient not medically ready Patient with new tracheostomy 11/15. Orders for SLP eval and treat for PMSV and swallowing received. Will follow pt closely for readiness for SLP interventions as appropriate.   Magda Muise I. Hardin Negus, [place], [place] Office number [phone] Pager [place] 05/29/2021, 9:28 AM

## 2021-05-29 NOTE — Progress Notes (Signed)
NAME:  Kimberly Gross, MRN:  [id number], DOB:  January 28, 1951, LOS: 53 ADMISSION DATE:  05/13/2021, CONSULTATION DATE:  05/13/21 REFERRING MD:  Ronnald Nian - EM, CHIEF COMPLAINT:  AMS    History of Present Illness:  70 yo F PMH CKD IV, DM, bipolar disorder, HTN, Osa on CPAP who was found obtunded in bed by family member 10/31 afternoon found to be severely hypoglycemic which improved with glucagon but no improvement in mental status. Intubated for GCS 3. CT head unremarkable, CBC and CMP also unremarkable.  Pertinent  Medical History  CKD IV Bipolar Disorder DM2  HLD Anxiety HTN OSA on CPAP  Significant Hospital Events: Including procedures, antibiotic start and stop dates in addition to other pertinent events   10/31 Obtunded and hypoglycemic for EMS, mentation did not improve with glucagon. Intubated in ED, CT H non acute. Admitting to PCCM  11/1 LP performed by IR, CSF clear with no organisms on gram stain 11/2 routine EEG read as seizure activity, loaded with levetiracetam 11/3 LTM EEG reviewed, correlating with metabolic encephalopathy rather than epileptiform activity 11/5 no acute events overnight, remains on continuous EEG with no signs of seizures, did not wean on MV 11/7 completed course of ampicillin-sulbactam for aspiration pneumonia 11/16 Trach  Interim History / Subjective:  No events. Some oozing from inferior edge of new trach stoma. Remains poorly responsive on vent.  Objective   Blood pressure (!) 143/71, pulse 87, temperature 99.9 F (37.7 C), temperature source Oral, resp. rate 18, height 5\' 4"  (1.626 m), weight 115.8 kg, SpO2 97 %.    Vent Mode: PRVC FiO2 (%):  [30 %] 30 % Set Rate:  [18 bmp] 18 bmp Vt Set:  [400 mL-440 mL] 440 mL PEEP:  [5 cmH20] 5 cmH20 Pressure Support:  [8 cmH20] 8 cmH20 Plateau Pressure:  [12 cmH20-18 cmH20] 18 cmH20   Intake/Output Summary (Last 24 hours) at 05/29/2021 4665 Last data filed at 05/29/2021 0600 Gross per 24 hour   Intake 2167.36 ml  Output 1700 ml  Net 467.36 ml    Filed Weights   05/25/21 0500 05/27/21 0417 05/29/21 0328  Weight: 113.2 kg 115.7 kg 115.8 kg    Examination: Ill appearing unresponsive woman lying in bed Mild oozing from new trach inferior edge, small thick secretions Lungs with diminished breath sounds, triggering vent Eyes seem unable to look upward, she does not open eyes or track No response to painful stimuli Ext without edema  Patient Lines/Drains/Airways Status     Active Line/Drains/Airways     Name Placement date Placement time Site Days   Peripheral IV 05/15/21 20 G 2.5" Anterior;Right Forearm 05/15/21  0613  Forearm  14   External Urinary Catheter 05/15/21  1743  --  14   Tracheostomy Shiley Flexible 8 mm Cuffed 05/28/21  1501  8 mm  1   Wound / Incision (Open or Dehisced) 05/20/21 Skin tear Back Left;Mid 05/20/21  0800  Back  9   Wound / Incision (Open or Dehisced) 05/20/21 Skin tear Back Right;Mid 05/20/21  0800  Back  9           Sugars okay Stable Cr, no new CBC, will get one tomorrow CXR yesterday personally reviewed, trach in good position, low lung volumes  Resolved Hospital Problem list   Fever Possible aspiration pneumonia Left hip external rotation - hip fracture ruled out   Assessment & Plan:   Hypoglycemia-induced diffuse cerebral injury- resulting in persistent vegetative state  Acute respiratory failure with  hypoxia requiring mechanical ventilation- due to poor secretion clearance ability in setting of profound CNS injury - Work toward [employer] today  AKI on CKD IV  Hyperkalemia Metabolic acidosis - All stable, add some free water today  DM2 , presented with hypoglycemia. Uncontrolled hyperglycemia. No further hypoglycemic episodes. -lantus 8 units daily -con't SSI  HTN, uncontrolled HLD -Con't ASA, statin, amlodipine, hydralazine, coreg, HCTZ  Dysphagia- cortrak consult  Trach bleeding- local care, retime heparin for  tomorrow  Goals of care Unclear at this time, per previous discussion goal is for family to take her home; will verify this today and work toward TC otherwise will need home vent and extensive family education.  If they are unable she will need vent-SNF   Best Practice (right click and "Reselect all SmartList Selections" daily)   Diet/type: tubefeeds and NPO DVT prophylaxis: prophylactic heparin  GI prophylaxis: PPI Lines: N/A Foley:  N/A Code Status:  full code Last date of multidisciplinary goals of care discussion [11/10]   Patient critically ill due to encephalopathy, respiratory failure Interventions to address this today vent titration Risk of deterioration without these interventions is high  I personally spent 31 minutes providing critical care not including any separately billable procedures  Erskine Emery MD [facility]  Prefer epic messenger for cross cover needs If after hours, please call E-link

## 2021-05-29 NOTE — Progress Notes (Signed)
Nutrition Follow-up  DOCUMENTATION CODES:   Not applicable  INTERVENTION:   Resume tube feeding via Cortrak tube: - Jevity 1.5 @ 50 ml/hr (1200 ml/day) - ProSource TF 45 ml TID - Free water flushes of 200 ml q 6 hours   Tube feeding regimen provides 1920 kcal, 110 grams of protein, and 912 ml of H2O.  Total free water with flushes: 1712 ml  - Continue NutriSource Fiber TID per tube  NUTRITION DIAGNOSIS:   Inadequate oral intake related to inability to eat as evidenced by NPO status.  Ongoing, being addressed via TF  GOAL:   Patient will meet greater than or equal to 90% of their needs  Met via TF  MONITOR:   Vent status, Labs, Weight trends, TF tolerance, I & O's  REASON FOR ASSESSMENT:   Ventilator, Consult Enteral/tube feeding initiation and management  ASSESSMENT:   70 year old female who presented to the ED on 10/31 with AMS. PMH of CKD stage IV, T2DM, bipolar disorder, HTN, OSA on CPAP. Pt admitted with acute metabolic encephalopathy, acute respiratory failure with hypoxia.  11/01 - s/p LP 11/15 - s/p trach 11/16 - Cortrak placed (tip gastric)  Discussed pt with RN and during ICU rounds. Working towards trach collar. Per discussion with RN yesterday, bowel movements are more formed. Rectal tube was removed 11/15. Cortrak placed this AM, tip gastric per x-ray. Discussed with RN plan to resume tube feeds at goal rate.  Admit weight: 111.7 kg Current weight: 115.8 kg  Patient is currently on ventilator support via trach MV: 8.7 L/min Temp (24hrs), Avg:99.7 F (37.6 C), Min:98.7 F (37.1 C), Max:100.8 F (38.2 C)  Medications reviewed and include: nutrisource fiber TID, SSI q 4 hours, semglee 8 units daily, protonix, IV keppra  Labs reviewed: sodium 146, BUN 75, creatinine 1.60 CBG's: 115-169 x 24 hours  UOP: 1700 ml x 24 hours I/O's: +11.4 L since admit  Diet Order:   Diet Order             Diet NPO time specified  Diet effective now                    EDUCATION NEEDS:   Not appropriate for education at this time  Skin:  Skin Assessment: Skin Integrity Issues: Other: skin tear to back x 2  Last BM:  05/28/21 medium type 6  Height:   Ht Readings from Last 1 Encounters:  05/13/21 _0  (1.626 m)    Weight:   Wt Readings from Last 1 Encounters:  05/29/21 115.8 kg    Ideal Body Weight:  54.5 kg  BMI:  Body mass index is 43.82 kg/m.  Estimated Nutritional Needs:   Kcal:  1700-1900  Protein:  110-130 grams  Fluid:  1.7-1.9 L    Gustavus Bryant, MS, RD, LDN Inpatient Clinical Dietitian Please see AMiON for contact information.

## 2021-05-29 NOTE — Progress Notes (Signed)
Pt currently on 5L 28% ATC and is tolerating well at this time. RT will continue to monitor throughout night and place pt back on vent if pt becomes SOB.

## 2021-05-29 NOTE — Procedures (Signed)
Cortrak  Tube Type:  Cortrak - 43 inches Tube Location:  Left nare Initial Placement:  Stomach Secured by: Bridle Technique Used to Measure Tube Placement:  Marking at nare/corner of mouth Cortrak Secured At:  65 cm  Cortrak Tube Team Note:  Consult received to place a Cortrak feeding tube.   X-ray is required, abdominal x-ray has been ordered by the Cortrak team. Please confirm tube placement before using the Cortrak tube.   If the tube becomes dislodged please keep the tube and contact the Cortrak team at [email or web address] (password [email or web address]) for replacement.  If after hours and replacement cannot be delayed, place a NG tube and confirm placement with an abdominal x-ray.    Jaquanna Ballentine MS, RD, LDN Please refer to AMION for RD and/or RD on-call/weekend/after hours pager   

## 2021-05-30 ENCOUNTER — Telehealth: Payer: Medicare (Managed Care)

## 2021-05-30 DIAGNOSIS — N179 Acute kidney failure, unspecified: Secondary | ICD-10-CM | POA: Diagnosis not present

## 2021-05-30 DIAGNOSIS — J9601 Acute respiratory failure with hypoxia: Secondary | ICD-10-CM | POA: Diagnosis not present

## 2021-05-30 DIAGNOSIS — G9340 Encephalopathy, unspecified: Secondary | ICD-10-CM | POA: Diagnosis not present

## 2021-05-30 DIAGNOSIS — G9341 Metabolic encephalopathy: Secondary | ICD-10-CM | POA: Diagnosis not present

## 2021-05-30 LAB — CBC
HCT: 31.2 % — ABNORMAL LOW (ref 36.0–46.0)
Hemoglobin: 9.1 g/dL — ABNORMAL LOW (ref 12.0–15.0)
MCH: 28.7 pg (ref 26.0–34.0)
MCHC: 29.2 g/dL — ABNORMAL LOW (ref 30.0–36.0)
MCV: 98.4 fL (ref 80.0–100.0)
Platelets: 337 10*3/uL (ref 150–400)
RBC: 3.17 MIL/uL — ABNORMAL LOW (ref 3.87–5.11)
RDW: 11.9 % (ref 11.5–15.5)
WBC: 9.6 10*3/uL (ref 4.0–10.5)
nRBC: 0 % (ref 0.0–0.2)

## 2021-05-30 LAB — GLUCOSE, CAPILLARY
Glucose-Capillary: 159 mg/dL — ABNORMAL HIGH (ref 70–99)
Glucose-Capillary: 149 mg/dL — ABNORMAL HIGH (ref 70–99)
Glucose-Capillary: 161 mg/dL — ABNORMAL HIGH (ref 70–99)
Glucose-Capillary: 141 mg/dL — ABNORMAL HIGH (ref 70–99)
Glucose-Capillary: 194 mg/dL — ABNORMAL HIGH (ref 70–99)
Glucose-Capillary: 179 mg/dL — ABNORMAL HIGH (ref 70–99)

## 2021-05-30 LAB — MAGNESIUM: Magnesium: 2.4 mg/dL (ref 1.7–2.4)

## 2021-05-30 LAB — BASIC METABOLIC PANEL
Anion gap: 11 (ref 5–15)
BUN: 82 mg/dL — ABNORMAL HIGH (ref 8–23)
CO2: 23 mmol/L (ref 22–32)
Calcium: 9.5 mg/dL (ref 8.9–10.3)
Chloride: 110 mmol/L (ref 98–111)
Creatinine, Ser: 1.7 mg/dL — ABNORMAL HIGH (ref 0.44–1.00)
GFR, Estimated: 32 mL/min — ABNORMAL LOW (ref >60)
Glucose, Bld: 162 mg/dL — ABNORMAL HIGH (ref 70–99)
Potassium: 4.2 mmol/L (ref 3.5–5.1)
Sodium: 144 mmol/L (ref 135–145)

## 2021-05-30 MED ORDER — NUTRISOURCE FIBER PO PACK
2.0000 | PACK | Freq: Three times a day (TID) | ORAL | Status: DC
Start: 2021-05-30 — End: 2021-06-13
  Administered 2021-05-30 – 2021-06-13 (×42): 2
  Filled 2021-05-30 (×46): qty 2

## 2021-05-30 MED ORDER — LEVETIRACETAM 100 MG/ML PO SOLN
500.0000 mg | Freq: Two times a day (BID) | ORAL | Status: DC
  Administered 2021-05-30 – 2021-06-13 (×29): 500 mg
  Filled 2021-05-30 (×31): qty 5

## 2021-05-30 NOTE — Progress Notes (Signed)
NAME:  Kimberly Gross, MRN:  [id number], DOB:  [birth year], LOS: 71 ADMISSION DATE:  05/13/2021, CONSULTATION DATE:  05/13/21 REFERRING MD:  Ronnald Nian - EM, CHIEF COMPLAINT:  AMS    History of Present Illness:  70 yo F PMH CKD IV, DM, bipolar disorder, HTN, Osa on CPAP who was found obtunded in bed by family member 10/31 afternoon found to be severely hypoglycemic which improved with glucagon but no improvement in mental status. Intubated for GCS 3. CT head unremarkable, CBC and CMP also unremarkable.  Pertinent  Medical History  CKD IV Bipolar Disorder DM2  HLD Anxiety HTN OSA on CPAP  Significant Hospital Events: Including procedures, antibiotic start and stop dates in addition to other pertinent events   10/31 Obtunded and hypoglycemic for EMS, mentation did not improve with glucagon. Intubated in ED, CT H non acute. Admitting to PCCM  11/1 LP performed by IR, CSF clear with no organisms on gram stain 11/2 routine EEG read as seizure activity, loaded with levetiracetam 11/3 LTM EEG reviewed, correlating with metabolic encephalopathy rather than epileptiform activity 11/5 no acute events overnight, remains on continuous EEG with no signs of seizures, did not wean on MV 11/7 completed course of ampicillin-sulbactam for aspiration pneumonia 11/16 Trach  Interim History / Subjective:  No events. Tracheostomy oozing has stopped Patient remained afebrile  Objective   Blood pressure (!) 121/58, pulse 81, temperature 98.8 F (37.1 C), temperature source Axillary, resp. rate (!) 22, height 5\' 4"  (1.626 m), weight 112.3 kg, SpO2 97 %.    FiO2 (%):  [28 %] 28 %   Intake/Output Summary (Last 24 hours) at 05/30/2021 1508 Last data filed at 05/30/2021 0900 Gross per 24 hour  Intake 1157.46 ml  Output 1475 ml  Net -317.54 ml   Filed Weights   05/27/21 0417 05/29/21 0328 05/30/21 0410  Weight: 115.7 kg 115.8 kg 112.3 kg    Examination:   Physical exam: General: Crtitically  ill-appearing elderly female, s/p trach HEENT: [place]/AT, eyes anicteric.  Cortrak in place Neuro: Eyes open, does not track examiner or follow commands, no movement to painful stimuli in all extremities Chest: Coarse breath sounds, no wheezes or rhonchi Heart: Regular rate and rhythm, no murmurs or gallops Abdomen: Soft, nontender, nondistended, bowel sounds present Skin: No rash    Patient Lines/Drains/Airways Status     Active Line/Drains/Airways     Name Placement date Placement time Site Days   Peripheral IV 05/15/21 20 G 2.5" Anterior;Right Forearm 05/15/21  0613  Forearm  14   External Urinary Catheter 05/15/21  1743  --  14   Tracheostomy Shiley Flexible 8 mm Cuffed 05/28/21  1501  8 mm  1   Wound / Incision (Open or Dehisced) 05/20/21 Skin tear Back Left;Mid 05/20/21  0800  Back  9   Wound / Incision (Open or Dehisced) 05/20/21 Skin tear Back Right;Mid 05/20/21  0800  Back  9            Resolved Hospital Problem list   Fever Possible aspiration pneumonia Left hip external rotation - hip fracture ruled out   Assessment & Plan:   Hypoglycemia-induced diffuse cerebral injury leading to encephalopathy- resulting in persistent vegetative state No change in patient's mental status  Acute respiratory failure with hypoxia requiring mechanical ventilation- due to poor secretion clearance ability in setting of profound CNS injury Remained on trach collar Continue trach collar as tolerated  AKI on CKD IV  Hyperkalemia Hyponatremia Metabolic acidosis AKI, hyperkalemia and  metabolic acidosis has improved Continue free water flushes Closely monitor electrolytes  DM2 , presented with hypoglycemia. Uncontrolled hyperglycemia. No further hypoglycemic episodes. Continue Lantus and sliding scale insulin with CBG goal 140-180  HTN, uncontrolled HLD Con't ASA, statin, amlodipine, coreg, HCTZ.  Decrease hydralazine to 50 mg 3 times daily  Dysphagia-IR consulted for PEG  placement  Trach bleeding-resolved, continue routine trach care   Best Practice (right click and "Reselect all SmartList Selections" daily)   Diet/type: tubefeeds and NPO DVT prophylaxis: prophylactic heparin  GI prophylaxis: PPI Lines: N/A Foley:  N/A Code Status:  full code Last date of multidisciplinary goals of care discussion [11/10]   Total critical care time: 31 minutes  Performed by: [place] care time was exclusive of separately billable procedures and treating other patients.   Critical care was necessary to treat or prevent imminent or life-threatening deterioration.   Critical care was time spent personally by me on the following activities: development of treatment plan with patient and/or surrogate as well as nursing, discussions with consultants, evaluation of patient's response to treatment, examination of patient, obtaining history from patient or surrogate, ordering and performing treatments and interventions, ordering and review of laboratory studies, ordering and review of radiographic studies, pulse oximetry and re-evaluation of patient's condition.   Jacky Kindle MD [facility] See Amion for pager If no response to pager, please call [phone] until 7pm After 7pm, Please call E-link [phone]

## 2021-05-30 NOTE — Evaluation (Signed)
[facility] - Evaluation Patient Details  Name: Kimberly Gross MRN: [id number] Date of Birth: [birth year]  Today's Date: 05/30/2021 Time: 7062-3762 SLP Time Calculation (min) (ACUTE ONLY): 22 min  Past Medical History:  Past Medical History:  Diagnosis Date   Anemia    Anxiety    Bipolar 1 disorder ([place])    CKD (chronic kidney disease), stage IV ([place])    Depression    Diabetes mellitus without complication ([place])    Hyperlipemia    Hypertension    Occasional tremors    Past Surgical History:  Past Surgical History:  Procedure Laterality Date   CATARACT EXTRACTION, BILATERAL  2021   VAGINAL HYSTERECTOMY     HPI:  Pt is a 70 y/o female who presented to the ED after being found obtunded in bed by family member 10/31. Pt found to be severely hypoglycemic which improved with glucagon, but no improvement in mental status. Pt intubated for GCS 3; ETT 10/31-trach11/16. EEG 83/1: metabolic encephalopathy rather than epileptiform activity. CT head & MRI brain unremarkable, CBC and CMP also unremarkable. IR consulted for PEG 11/17.  PMH CKD IV, DM, bipolar disorder, HTN, OSA on CPAP    Assessment / Plan / Recommendation  Clinical Impression  Pt was seen for PMSV evaluation. Alertness was suboptimal, but adequate for evaluation. Pt was educated regarding the anatomy of the larynx, the impact of the trach on voicing, and the goals of the session. However, her comprehension of this questioned. No attempts were made to communicate at baseline despite support. Cuff was inflated upon SLP's arrival and pt tolerated cuff deflation well. She presented with vitals of RR 28, SpO2 97, and HR 79 upon cuff deflation. She tolerated PMSV for 4 minutes with vitals ranging RR 19-28, SpO2 96-97, and HR 80-81. Coughing was noted following PMSV placement, but pt was unable to expectorate secretions orally or tracheally when PMSV was intermittently removed. No evidence of air trapping noted. Pt  demonstrated some vocalization and throat clearing with PMSV placement, but no verbalization was noted despite prompts and cues. The PMSV was removed due to pt's increasing WOB, but vitals remained stable. Wheezing-type respiration was noted upon PMSV removal after increase in WOB. Cuff was re-inflated by SLP and RN was contacted. Ryan, RN and Nate, RN arrived, tracheal suctioning was performed by RN, pt's respiratory effort returned to baseline, and wheezing-type sounds were eliminated. It is recommended that PMSV be used only with SLP at this time, and SLP will follow pt.  SLP Visit Diagnosis: Aphonia (R49.1)    SLP Assessment  Patient needs continued Speech [facility]    Recommendations for follow up therapy are one component of a multi-disciplinary discharge planning process, led by the attending physician.  Recommendations may be updated based on patient status, additional functional criteria and insurance authorization.  Follow Up Recommendations   (TBD)    Assistance Recommended at Discharge Frequent or constant Supervision/Assistance  Functional Status Assessment Patient has had a recent decline in their functional status and/or demonstrates limited ability to make significant improvements in function in a reasonable and predictable amount of time  Frequency and Duration min 2x/week  2 weeks    PMSV Trial PMSV was placed for: 4 Able to redirect subglottic air through upper airway: Yes Able to Attain Phonation: Yes (for vocalization only) Able to Expectorate Secretions: No Intelligibility: Not tested Respirations During Trial:  (19) SpO2 During Trial:  (96) Pulse During Trial:  (81) Behavior: Lethargic;No attempt to communicate  Tracheostomy Tube       Vent Dependency  FiO2 (%): 28 %    Cuff Deflation Trial Tolerated Cuff Deflation: Yes Length of Time for Cuff Deflation Trial: 15   Americus Scheurich I. Hardin Negus, [place], [place] Office  number [phone] Pager [place] 05/30/2021, 5:00 PM

## 2021-05-31 DIAGNOSIS — L8990 Pressure ulcer of unspecified site, unspecified stage: Secondary | ICD-10-CM | POA: Diagnosis not present

## 2021-05-31 DIAGNOSIS — J9601 Acute respiratory failure with hypoxia: Secondary | ICD-10-CM | POA: Diagnosis not present

## 2021-05-31 DIAGNOSIS — G9340 Encephalopathy, unspecified: Secondary | ICD-10-CM | POA: Diagnosis not present

## 2021-05-31 LAB — GLUCOSE, CAPILLARY
Glucose-Capillary: 143 mg/dL — ABNORMAL HIGH (ref 70–99)
Glucose-Capillary: 152 mg/dL — ABNORMAL HIGH (ref 70–99)
Glucose-Capillary: 155 mg/dL — ABNORMAL HIGH (ref 70–99)
Glucose-Capillary: 158 mg/dL — ABNORMAL HIGH (ref 70–99)
Glucose-Capillary: 160 mg/dL — ABNORMAL HIGH (ref 70–99)
Glucose-Capillary: 173 mg/dL — ABNORMAL HIGH (ref 70–99)

## 2021-05-31 LAB — BASIC METABOLIC PANEL
Anion gap: 9 (ref 5–15)
BUN: 79 mg/dL — ABNORMAL HIGH (ref 8–23)
CO2: 24 mmol/L (ref 22–32)
Calcium: 9.6 mg/dL (ref 8.9–10.3)
Chloride: 110 mmol/L (ref 98–111)
Creatinine, Ser: 1.58 mg/dL — ABNORMAL HIGH (ref 0.44–1.00)
GFR, Estimated: 35 mL/min — ABNORMAL LOW (ref >60)
Glucose, Bld: 171 mg/dL — ABNORMAL HIGH (ref 70–99)
Potassium: 4.2 mmol/L (ref 3.5–5.1)
Sodium: 143 mmol/L (ref 135–145)

## 2021-05-31 LAB — CBC
HCT: 27.1 % — ABNORMAL LOW (ref 36.0–46.0)
Hemoglobin: 8.4 g/dL — ABNORMAL LOW (ref 12.0–15.0)
MCH: 29.1 pg (ref 26.0–34.0)
MCHC: 31 g/dL (ref 30.0–36.0)
MCV: 93.8 fL (ref 80.0–100.0)
Platelets: 385 10*3/uL (ref 150–400)
RBC: 2.89 MIL/uL — ABNORMAL LOW (ref 3.87–5.11)
RDW: 11.9 % (ref 11.5–15.5)
WBC: 10.5 10*3/uL (ref 4.0–10.5)
nRBC: 0 % (ref 0.0–0.2)

## 2021-05-31 LAB — MAGNESIUM: Magnesium: 2.4 mg/dL (ref 1.7–2.4)

## 2021-05-31 MED ORDER — IPRATROPIUM-ALBUTEROL 0.5-2.5 (3) MG/3ML IN SOLN
RESPIRATORY_TRACT | Status: AC
  Administered 2021-05-31: 3 mL
  Filled 2021-05-31: qty 3

## 2021-05-31 MED ORDER — IPRATROPIUM-ALBUTEROL 0.5-2.5 (3) MG/3ML IN SOLN
3.0000 mL | RESPIRATORY_TRACT | Status: DC | PRN
  Administered 2021-06-12: 3 mL via RESPIRATORY_TRACT
  Filled 2021-05-31 (×2): qty 3

## 2021-05-31 MED ORDER — HYDRALAZINE HCL 50 MG PO TABS
50.0000 mg | ORAL_TABLET | Freq: Three times a day (TID) | ORAL | Status: DC
  Administered 2021-05-31 – 2021-06-09 (×24): 50 mg
  Filled 2021-05-31 (×27): qty 1

## 2021-05-31 MED ORDER — WHITE PETROLATUM EX OINT
TOPICAL_OINTMENT | CUTANEOUS | Status: DC | PRN
  Filled 2021-05-31: qty 30

## 2021-05-31 MED ORDER — GERHARDT'S BUTT CREAM
TOPICAL_CREAM | Freq: Two times a day (BID) | CUTANEOUS | Status: DC
  Administered 2021-05-31 – 2021-06-03 (×3): 1 via TOPICAL
  Filled 2021-05-31 (×3): qty 1

## 2021-05-31 NOTE — Progress Notes (Addendum)
NAME:  Kimberly Gross, MRN:  [id number], DOB:  30-Sep-1950, LOS: 48 ADMISSION DATE:  05/13/2021, CONSULTATION DATE:  05/13/21 REFERRING MD:  Ronnald Nian - EM, CHIEF COMPLAINT:  AMS    History of Present Illness:  70 yo F PMH CKD IV, DM, bipolar disorder, HTN, Osa on CPAP who was found obtunded in bed by family member 10/31 afternoon found to be severely hypoglycemic which improved with glucagon but no improvement in mental status. Intubated for GCS 3. CT head unremarkable, CBC and CMP also unremarkable.  Pertinent  Medical History  CKD IV Bipolar Disorder DM2  HLD Anxiety HTN OSA on CPAP  Significant Hospital Events: Including procedures, antibiotic start and stop dates in addition to other pertinent events   10/31 Obtunded and hypoglycemic for EMS, mentation did not improve with glucagon. Intubated in ED, CT H non acute. Admitting to PCCM  11/1 LP performed by IR, CSF clear with no organisms on gram stain 11/2 routine EEG read as seizure activity, loaded with levetiracetam 11/3 LTM EEG reviewed, correlating with metabolic encephalopathy rather than epileptiform activity 11/5 no acute events overnight, remains on continuous EEG with no signs of seizures, did not wean on MV 11/7 completed course of ampicillin-sulbactam for aspiration pneumonia 11/16 Trach  Interim History / Subjective:  No new events. Patient remained afebrile Scheduled for PEG tube placement today  Objective   Blood pressure (!) 118/56, pulse 82, temperature 99.9 F (37.7 C), temperature source Oral, resp. rate 19, height 5\' 4"  (1.626 m), weight 114.2 kg, SpO2 99 %.    FiO2 (%):  [28 %] 28 %   Intake/Output Summary (Last 24 hours) at 05/31/2021 0939 Last data filed at 05/31/2021 0825 Gross per 24 hour  Intake 1388.63 ml  Output 1250 ml  Net 138.63 ml   Filed Weights   05/29/21 0328 05/30/21 0410 05/31/21 0404  Weight: 115.8 kg 112.3 kg 114.2 kg    Examination:   Physical exam: General: Crtitically  ill-appearing elderly female, s/p trach HEENT: [place]/AT, eyes anicteric.  Cortrak in place Neuro: Eyes open, does not track examiner or follow commands, no movement to painful stimuli in all extremities Chest: Coarse breath sounds, no wheezes or rhonchi Heart: Regular rate and rhythm, no murmurs or gallops Abdomen: Soft, nontender, nondistended, bowel sounds present Skin: No rash    Patient Lines/Drains/Airways Status     Active Line/Drains/Airways     Name Placement date Placement time Site Days   Peripheral IV 05/15/21 20 G 2.5" Anterior;Right Forearm 05/15/21  0613  Forearm  14   External Urinary Catheter 05/15/21  1743  --  14   Tracheostomy [place] Flexible 8 mm Cuffed 05/28/21  1501  8 mm  1   Wound / Incision (Open or Dehisced) 05/20/21 Skin tear Back Left;Mid 05/20/21  0800  Back  9   Wound / Incision (Open or Dehisced) 05/20/21 Skin tear Back Right;Mid 05/20/21  0800  Back  9            Resolved Hospital Problem list   Good morning Possible aspiration pneumonia Left hip external rotation - hip fracture ruled out Hyperkalemia Hyponatremia AKI on CKD4 Metabolic acidosis   Assessment & Plan:   Hypoglycemia-induced diffuse cerebral injury leading to encephalopathy- resulting in persistent vegetative state No change in patient's mental status  Acute respiratory failure with hypoxia requiring mechanical ventilation- due to poor secretion clearance ability in setting of profound CNS injury Remained on trach collar for over 24 hours now Continue trach collar  as tolerated  DM2 , presented with hypoglycemia. Uncontrolled hyperglycemia. No further hypoglycemic episodes. Fingersticks are well controlled now Continue Lantus and sliding scale insulin with CBG goal 140-180  HTN HLD Blood pressure is controlled Con't ASA, statin, amlodipine, coreg, HCTZ and hydralazine to 50 mg 3 times daily  Dysphagia- Scheduled for IR PEG placement today  Trach bleeding-resolved,  continue routine trach care   Best Practice (right click and "Reselect all SmartList Selections" daily)   Diet/type: tubefeeds and NPO DVT prophylaxis: prophylactic heparin  GI prophylaxis: PPI Lines: N/A Foley:  N/A Code Status:  full code Last date of multidisciplinary goals of care discussion [11/18] spoke with patient's daughter and updated about her condition.   Total critical care time: 30 minutes  Performed by: [place] care time was exclusive of separately billable procedures and treating other patients.   Critical care was necessary to treat or prevent imminent or life-threatening deterioration.   Critical care was time spent personally by me on the following activities: development of treatment plan with patient and/or surrogate as well as nursing, discussions with consultants, evaluation of patient's response to treatment, examination of patient, obtaining history from patient or surrogate, ordering and performing treatments and interventions, ordering and review of laboratory studies, ordering and review of radiographic studies, pulse oximetry and re-evaluation of patient's condition.   Jacky Kindle MD [facility] See Amion for pager If no response to pager, please call [phone] until 7pm After 7pm, Please call E-link [phone]

## 2021-05-31 NOTE — Progress Notes (Signed)
SLP Cancellation Note  Patient Details Name: MERAL GEISSINGER MRN: [id number] DOB: 08/23/1950   Cancelled treatment:       Reason Eval/Treat Not Completed: Fatigue/lethargy limiting ability to participate (Pt opened her eyes with verbal and tactile stimulation, but she was unable to maintain an adequate level of alertness to participate. SLP will follow up on subsequent date.)  Jianna Drabik I. Hardin Negus, [place], [place] Office number [phone] Pager [place] 05/31/2021, 9:47 AM

## 2021-06-01 ENCOUNTER — Inpatient Hospital Stay (HOSPITAL_COMMUNITY): Payer: Medicare (Managed Care)

## 2021-06-01 DIAGNOSIS — Z789 Other specified health status: Secondary | ICD-10-CM

## 2021-06-01 DIAGNOSIS — Z930 Tracheostomy status: Secondary | ICD-10-CM

## 2021-06-01 DIAGNOSIS — S062XAA Diffuse traumatic brain injury with loss of consciousness status unknown, initial encounter: Secondary | ICD-10-CM | POA: Diagnosis present

## 2021-06-01 DIAGNOSIS — R4182 Altered mental status, unspecified: Secondary | ICD-10-CM | POA: Diagnosis not present

## 2021-06-01 DIAGNOSIS — Z515 Encounter for palliative care: Secondary | ICD-10-CM | POA: Diagnosis not present

## 2021-06-01 DIAGNOSIS — D631 Anemia in chronic kidney disease: Secondary | ICD-10-CM | POA: Diagnosis present

## 2021-06-01 DIAGNOSIS — G9340 Encephalopathy, unspecified: Secondary | ICD-10-CM | POA: Diagnosis not present

## 2021-06-01 DIAGNOSIS — R403 Persistent vegetative state: Secondary | ICD-10-CM | POA: Diagnosis present

## 2021-06-01 DIAGNOSIS — Z4659 Encounter for fitting and adjustment of other gastrointestinal appliance and device: Secondary | ICD-10-CM

## 2021-06-01 DIAGNOSIS — J9601 Acute respiratory failure with hypoxia: Secondary | ICD-10-CM | POA: Diagnosis not present

## 2021-06-01 LAB — CBC
HCT: 28.2 % — ABNORMAL LOW (ref 36.0–46.0)
Hemoglobin: 8.7 g/dL — ABNORMAL LOW (ref 12.0–15.0)
MCH: 28.6 pg (ref 26.0–34.0)
MCHC: 30.9 g/dL (ref 30.0–36.0)
MCV: 92.8 fL (ref 80.0–100.0)
Platelets: 394 10*3/uL (ref 150–400)
RBC: 3.04 MIL/uL — ABNORMAL LOW (ref 3.87–5.11)
RDW: 11.9 % (ref 11.5–15.5)
WBC: 9.3 10*3/uL (ref 4.0–10.5)
nRBC: 0 % (ref 0.0–0.2)

## 2021-06-01 LAB — GLUCOSE, CAPILLARY
Glucose-Capillary: 163 mg/dL — ABNORMAL HIGH (ref 70–99)
Glucose-Capillary: 147 mg/dL — ABNORMAL HIGH (ref 70–99)
Glucose-Capillary: 126 mg/dL — ABNORMAL HIGH (ref 70–99)
Glucose-Capillary: 150 mg/dL — ABNORMAL HIGH (ref 70–99)
Glucose-Capillary: 202 mg/dL — ABNORMAL HIGH (ref 70–99)

## 2021-06-01 LAB — BASIC METABOLIC PANEL
Anion gap: 8 (ref 5–15)
BUN: 76 mg/dL — ABNORMAL HIGH (ref 8–23)
CO2: 25 mmol/L (ref 22–32)
Calcium: 9.5 mg/dL (ref 8.9–10.3)
Chloride: 109 mmol/L (ref 98–111)
Creatinine, Ser: 1.55 mg/dL — ABNORMAL HIGH (ref 0.44–1.00)
GFR, Estimated: 36 mL/min — ABNORMAL LOW (ref >60)
Glucose, Bld: 172 mg/dL — ABNORMAL HIGH (ref 70–99)
Potassium: 4.3 mmol/L (ref 3.5–5.1)
Sodium: 142 mmol/L (ref 135–145)

## 2021-06-01 LAB — MAGNESIUM: Magnesium: 2.4 mg/dL (ref 1.7–2.4)

## 2021-06-01 IMAGING — CT CT ABDOMEN W/O CM
2 of 4 series · 16 of 46 positions shown, 18 images · non-contrast
Comparison: None.

CLINICAL DATA: Dysphagia, assess gastric anatomy for percutaneous
fluoroscopic gastrostomy

EXAM:
CT ABDOMEN WITHOUT CONTRAST
TECHNIQUE: Multidetector CT imaging of the abdomen was performed following the
standard protocol without IV contrast.

[Series 3: ap without · axial · non-contrast · 0.84mm/px · z∈[+1208,+1464]mm · 13 of 61 slices shown, 15 images]
[im 5/61  soft-tissue]
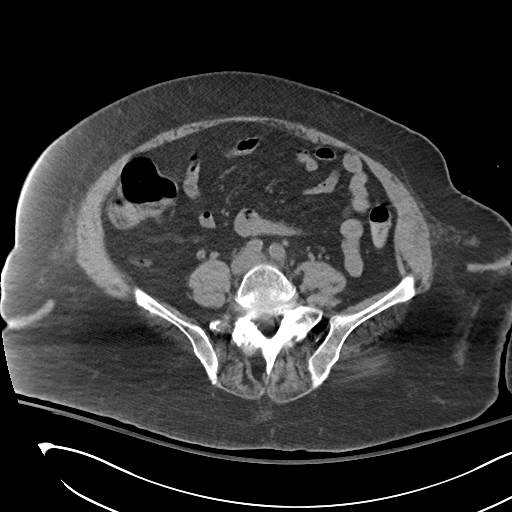
[im 5/61  bone]
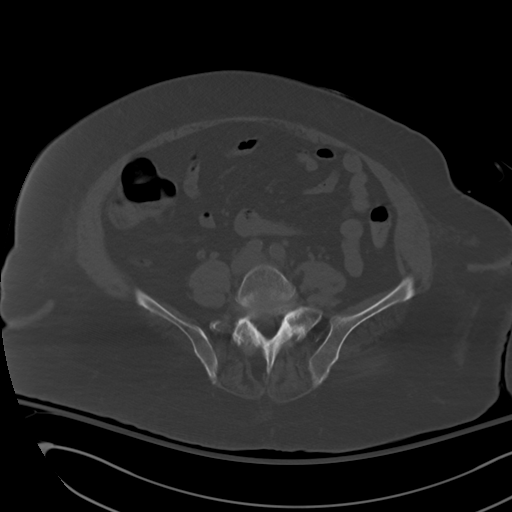
[im 9/61  soft-tissue]
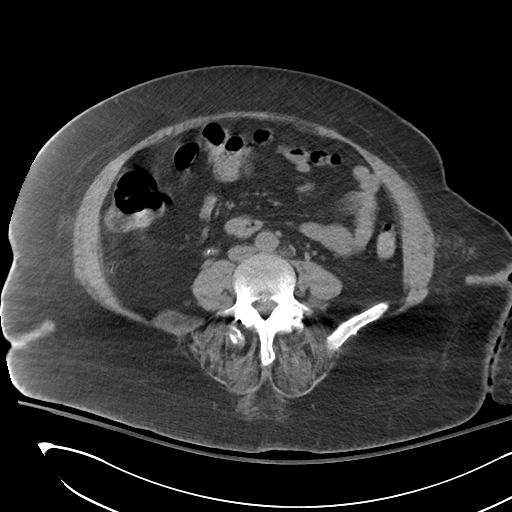
[im 13/61  soft-tissue]
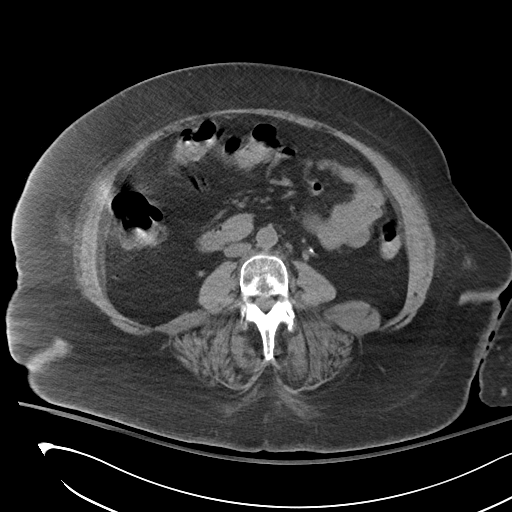
[im 18/61  soft-tissue]
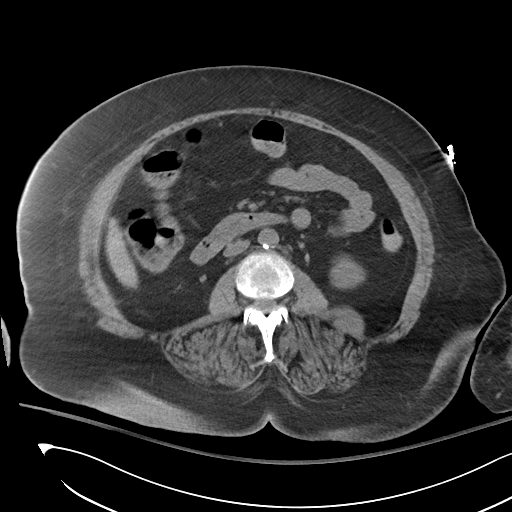
[im 22/61  soft-tissue]
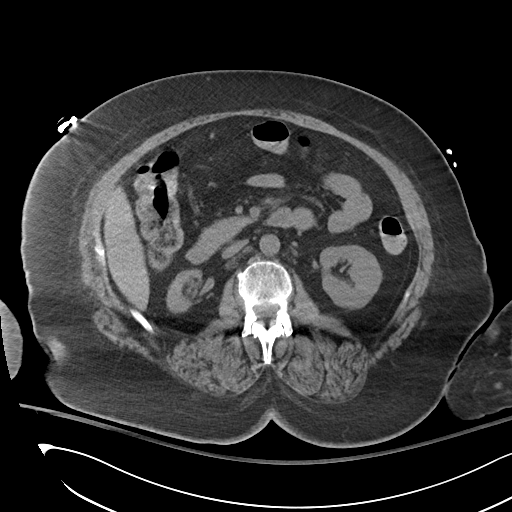
[im 26/61  soft-tissue]
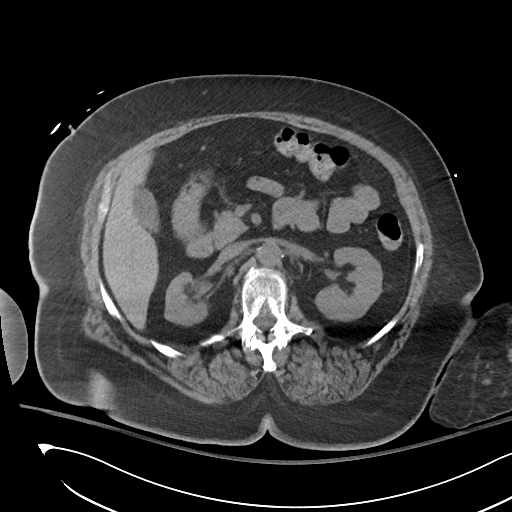
[im 31/61  soft-tissue]
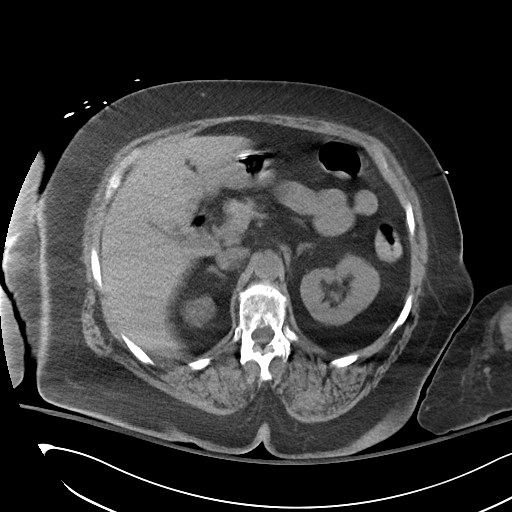
[im 35/61  soft-tissue]
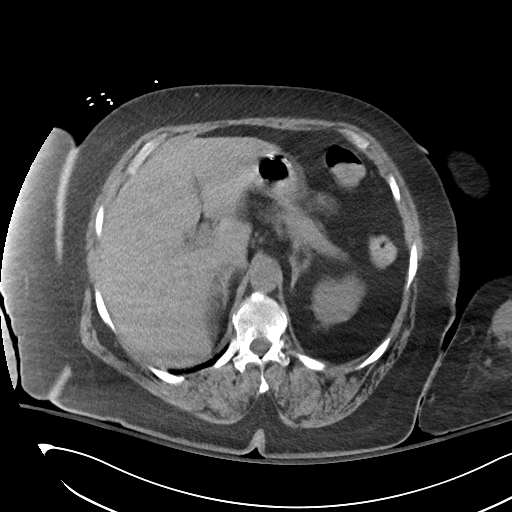
[im 39/61  soft-tissue]
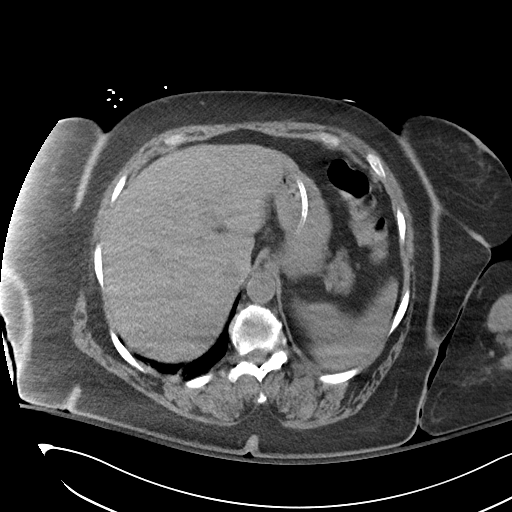
[im 39/61  bone]
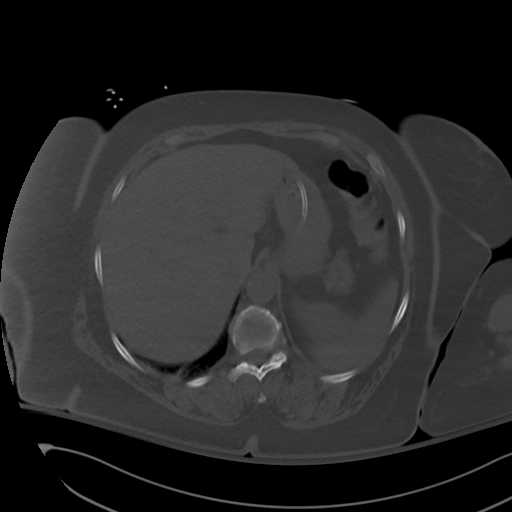
[im 43/61  soft-tissue]
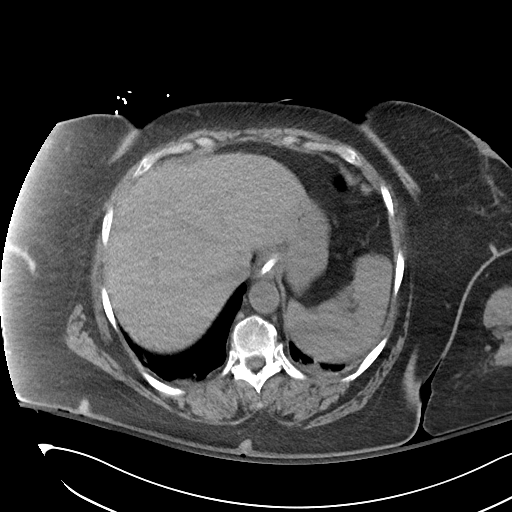
[im 48/61  soft-tissue]
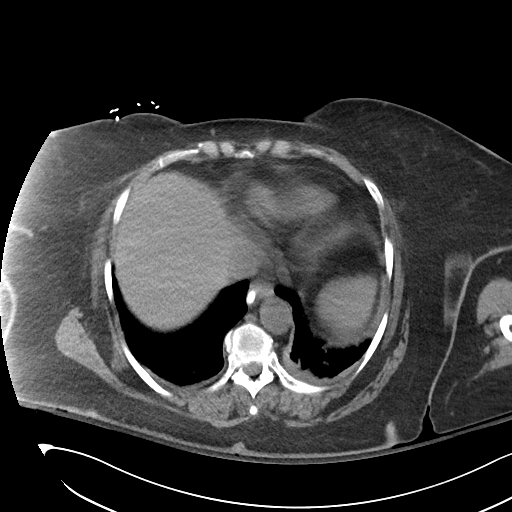
[im 52/61  soft-tissue]
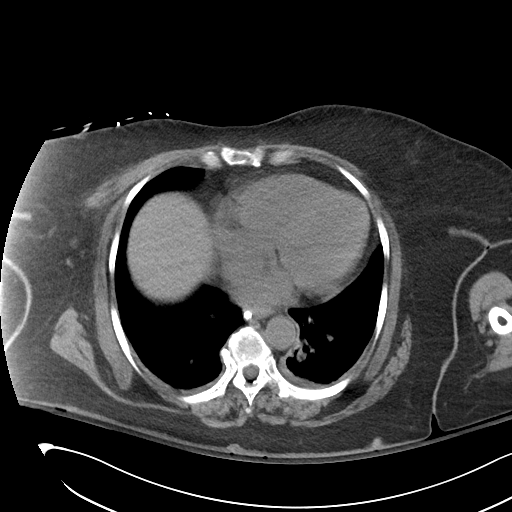
[im 56/61  soft-tissue]
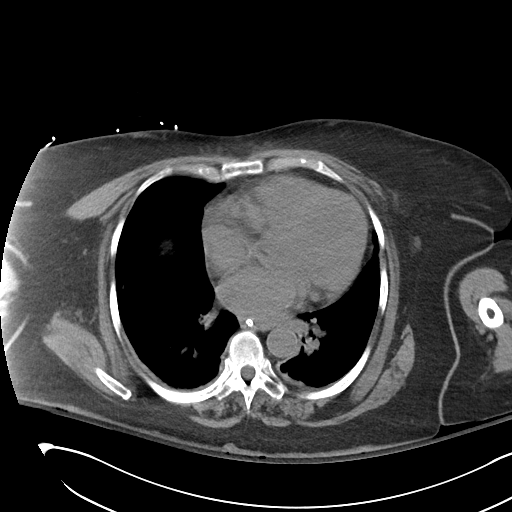

[Series 6: cor · coronal · 0.63mm/px · 3 of 113 slices shown]
[im 38/113  soft-tissue]
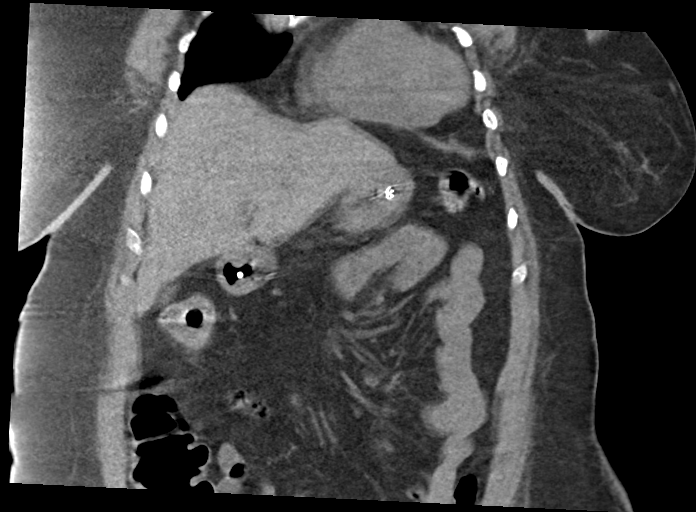
[im 50/113  soft-tissue]
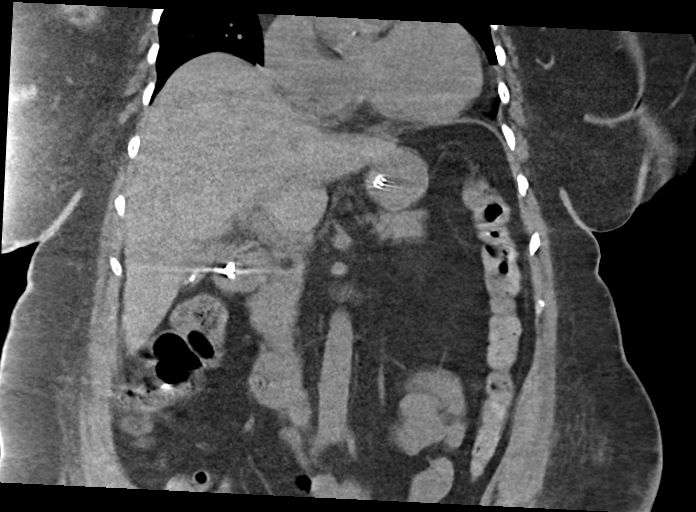
[im 63/113  soft-tissue]
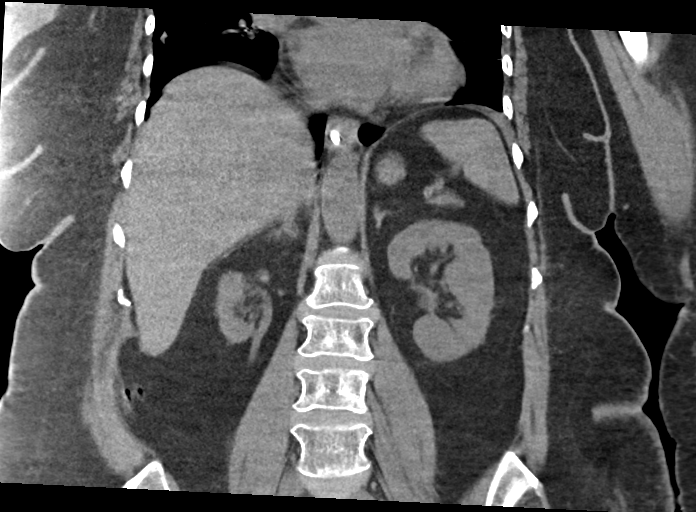

[16 of 46 positions shown; findings below may reference images not displayed]

FINDINGS: Lower chest: Bibasilar atelectasis, slightly worse on the left.
Heart is enlarged. No pericardial or pleural effusion.

Hepatobiliary: Limited without IV contrast. Cholelithiasis noted.
Gallbladder nondilated. No large focal hepatic abnormality or
biliary obstruction pattern. Common bile duct nondilated.

Pancreas: Unremarkable. No pancreatic ductal dilatation or
surrounding inflammatory changes.

Spleen: Normal in size without focal abnormality.

Adrenals/Urinary Tract: normal adrenal glands. Right renal atrophy
noted appearing chronic. No renal obstruction or hydronephrosis. No
hydroureter.

Stomach/Bowel: Feeding tube within the proximal duodenum. Stomach is
collapsed. Stomach anatomy is favorable for fluoroscopic
percutaneous gastrostomy technique. The stomach is anteriorly
position in the abdomen and inferior to the left hepatic lobe. Colon
is inferior to the stomach.

Negative for bowel obstruction, significant dilatation, ileus, or
free air.

No free fluid, fluid collection, hemorrhage, hematoma, abscess or
ascites. Appendix is normal.

Vascular/Lymphatic: Limited without IV contrast. Negative for
aneurysm. No retroperitoneal hemorrhage or hematoma. Aortic
atherosclerosis noted. No adenopathy.

Other: No abdominal wall hernia or abnormality.

Musculoskeletal: Degenerative changes throughout the spine.
IMPRESSION: Stomach anatomy is favorable for fluoroscopic percutaneous
gastrostomy technique.

Mild cardiomegaly and bibasilar atelectasis.

Cholelithiasis

No other acute intra-abdominal finding by noncontrast CT

Aortic Atherosclerosis ([K5]-[K5]).

## 2021-06-01 NOTE — Assessment & Plan Note (Signed)
BP nomral -Continue Coreg, HCTZ, hydralazine

## 2021-06-01 NOTE — Progress Notes (Addendum)
Palliative Care Progress Note, Assessment & Plan   Patient Name: Kimberly Gross       Date: 06/01/2021 DOB: [birth year]  Age: 70 y.o. MRN#: [id number] Attending Physician: Edwin Dada, * Primary Care Physician: [name], FNP Admit Date: 05/13/2021  Reason for Consultation/Follow-up: Establishing goals of care  Subjective: Patient has been transferred to 3E09.  I assessed patient at bedside, who was receiving a daily bath.  She appeared in no apparent distress.  HPI: Kimberly Gross is a 70 year old female with a past medical history significant for CKD stage IV, diabetes type 2, bipolar disorder, HTN, and OSA (CPAP) who was found obtunded in bed by family on 10/31.  She was found to be severely hypoglycemic which improved but her mental status did not.  She was intubated.  CT of head is unremarkable.  Patient has a trach and feeding tube in place now plan is for patient to be discharged with family or to SNF.  Palliative medicine was consulted to establish goals of care.  Plan of Care: I personally reviewed the patient's chart, epic notes, labs, and imaging.  Assessed patient at bedside who was receiving a daily bath.  Patient is in no apparent distress.  No family at bedside.    As per the chart, goals are clear - familly wants full code/full scope and to take patient home or SNF.    Spoke with patient's daughter Kimberly Gross  telephone.  I re-introduced that the PMT is an extra layer of support for patients who have chronic diseases like her mother.  PMT supports both patients and families and provide symptoms management. Kimberly Gross confirmed understanding of PMT and vocalized that she has no questions, concerns, or any palliative care needs at this point.    Outpatient palliative services  introduced and explained. Kimberly Gross is open and agreed to palliative outpatient services following her mother.    Outpatient palliative services referral placed.   I advised her that PMT will not intervene unless called or we see that the patient declines.  PMT will continue to shadow the patient's chart and monitor her peripherally.  Advised daughter to please contact us should goals of care change or patient's status declines.  Questions and concerns were addressed. The family was encouraged to call with questions or concerns.   Care plan was discussed with patient's daughter Kimberly Gross   Code Status: Full code  Prognosis:  Unable to determine  Discharge Planning: Home with Palliative Services  Physical Exam Vitals and nursing note reviewed.  Constitutional:      General: She is not in acute distress.    Appearance: She is not ill-appearing.  HENT:     Head: Normocephalic and atraumatic.     Nose:     Comments: cortrak    Mouth/Throat:     Mouth: Mucous membranes are moist.  Cardiovascular:     Rate and Rhythm: Normal rate.  Pulmonary:     Effort: Pulmonary effort is normal.     Comments: trach Abdominal:     Palpations: Abdomen is soft.  Skin:    General: Skin is warm and dry.  Neurological:     Mental Status: Mental status is at baseline.  Palliative Assessment/Data: 30%       Total Time 15 minutes  Greater than 50%  of this time was spent counseling and coordinating care related to the above assessment and plan.  Thank you for allowing the Palliative Medicine Team to assist in the care of this patient.  Kimberly Ilsa Iha, FNP-BC Palliative Medicine Team Team Phone # [phone]

## 2021-06-01 NOTE — Progress Notes (Signed)
Progress Note    Kimberly Gross   EQA:[id number]  DOB: Nov 13, 1950  DOA: 05/13/2021     19 Date of Service: 06/01/2021      Brief summary: 70 yo F with DM, CKD IV, HTN, obesity found unresponsive by a family member.  Was severely hypogylcemic, but after improvement in blood sugar, remained unresponsive, required intubation for airway protection.   . 10/31 Obtunded and hypoglycemic for EMS, mentation did not improve with glucagon. Intubated in ED, CT H non acute. Admitting to PCCM  . 11/1 LP performed by IR, CSF clear with no organisms on gram stain . 11/2 routine EEG read as seizure activity, loaded with levetiracetam . 11/3 LTM EEG reviewed, correlating with metabolic encephalopathy rather than epileptiform activity . 11/5 no acute events overnight, remains on continuous EEG with no signs of seizures, did not wean on MV . 11/7 completed course of ampicillin-sulbactam for aspiration pneumonia . 11/16 Trach        Assessment and Plan Persistent vegetative state ([facility]) - Place PEG on Monday - TOC consult for long term placement  -Continue Keppra -Continue PPI  Pressure injury of skin Stage 2 left buttocks, not POA - Local wound care  Bipolar 1 disorder, mixed, moderate (HCC)    CKD (chronic kidney disease) stage 4, GFR 15-29 ml/min (HCC)  CR stable  Essential hypertension BP nomral -Continue Coreg, HCTZ, hydralazine  Morbid (severe) obesity due to excess calories (HCC) BMI 42 -Continue PPI  Type 2 diabetes mellitus without complication, without long-term current use of insulin (HCC) -Continue glargine -Continue SS corrections  -Continue aspirin and lipitor     Subjective:  No new fever, no respiratory distress.  No change in mentation.  No new concerns from nursing.  Objective Vitals:   06/01/21 1125 06/01/21 1200 06/01/21 1406 06/01/21 1520  BP:   129/62   Pulse:  77  85  Resp:  (!) 22  18  Temp: 99.1 F (37.3 C)     TempSrc: Oral     SpO2:   99%  98%  Weight:      Height:       112.6 kg  Vital signs reviewed and unremarkable except some elevated respiratory rate   Exam Physical Exam Constitutional:      Appearance: She is obese. She is not toxic-appearing.  HENT:     Head: Normocephalic and atraumatic.     Nose: No nasal deformity or rhinorrhea.  Eyes:     General: Lids are normal. No scleral icterus.    Conjunctiva/sclera:     Right eye: Right conjunctiva is not injected. No chemosis.    Left eye: Left conjunctiva is not injected. No chemosis.    Comments: Right gaze preference  Cardiovascular:     Rate and Rhythm: Normal rate and regular rhythm.     Heart sounds: Normal heart sounds, S1 normal and S2 normal. No murmur heard. Pulmonary:     Breath sounds: Decreased breath sounds and rhonchi present. No wheezing or rales.  Abdominal:     General: Abdomen is flat.     Palpations: Abdomen is soft.     Tenderness: There is no abdominal tenderness. There is no guarding.  Musculoskeletal:     Right lower leg: No edema.     Left lower leg: No edema.  Neurological:     Mental Status: She is unresponsive.     GCS: GCS eye subscore is 1. GCS verbal subscore is 1. GCS motor subscore is 1.  Labs / Other Information My review of labs, imaging, notes and other tests is significant for Creatinine stable, hemoglobin stable around 8     Disposition Plan: Status is: Inpatient  Remains inpatient appropriate because: She requires PEG tube placement, she has had a severe brain injury, she is unresponsive but will require total care after discharge, possibly forever, ongoing efforts at disposition are in place.  Spoke to daughter by Phone, [name]        Time spent: 35 minutes [facility] 06/01/2021, 3:34 PM

## 2021-06-01 NOTE — Assessment & Plan Note (Addendum)
BMI 42 -Continue PPI

## 2021-06-01 NOTE — Assessment & Plan Note (Addendum)
Stage 2 left buttocks, not POA - Local wound care

## 2021-06-01 NOTE — Hospital Course (Addendum)
Kimberly Gross is a 70 y.o. F with DM, CKD IV, HTN, obesity found unresponsive by a family member.  Was severely hypogylcemic, but after improvement in blood sugar, remained unresponsive, required intubation for airway protection.   10/31 Obtunded and hypoglycemic for EMS, mentation did not improve with glucagon. CT head unremarkable.  Intubated and amitted to ICU; started on Unasyn for aspiration pneumonia 11/1 LP normal 11/2 Seizures noted on EEG, started Keppra 11/3 LTM EEG reviewed, correlating with metabolic encephalopathy rather than epileptiform activity 11/4-11/15 No improvement in neurological function 11/16 Trach placed 11/19 Transferred to [facility] 11/21 In preparation for PEG placement and transfer to long term care, goals of care discussion with daughter, TRH and Palliative care; Daughter feels that patient would not have wanted to continue in current state, PEG on hold, daughter will discuss with family, may pursue Hospice 11/22 Fever again --> Blood cultures with MRSE; started vancomycin, ID consulted

## 2021-06-01 NOTE — Assessment & Plan Note (Addendum)
-   Place PEG on Monday - TOC consult for long term placement  -Continue Keppra -Continue PPI

## 2021-06-01 NOTE — Assessment & Plan Note (Signed)
CR stable

## 2021-06-01 NOTE — Assessment & Plan Note (Addendum)
-  Continue glargine -Continue SS corrections  -Continue aspirin and lipitor

## 2021-06-01 NOTE — Assessment & Plan Note (Signed)
Hgb stable 

## 2021-06-02 ENCOUNTER — Inpatient Hospital Stay (HOSPITAL_COMMUNITY): Payer: Medicare (Managed Care)

## 2021-06-02 DIAGNOSIS — G9341 Metabolic encephalopathy: Secondary | ICD-10-CM | POA: Diagnosis not present

## 2021-06-02 DIAGNOSIS — R7881 Bacteremia: Secondary | ICD-10-CM | POA: Diagnosis not present

## 2021-06-02 DIAGNOSIS — B957 Other staphylococcus as the cause of diseases classified elsewhere: Secondary | ICD-10-CM | POA: Diagnosis not present

## 2021-06-02 DIAGNOSIS — R509 Fever, unspecified: Secondary | ICD-10-CM | POA: Diagnosis not present

## 2021-06-02 DIAGNOSIS — S062XAA Diffuse traumatic brain injury with loss of consciousness status unknown, initial encounter: Secondary | ICD-10-CM | POA: Diagnosis not present

## 2021-06-02 DIAGNOSIS — G9340 Encephalopathy, unspecified: Secondary | ICD-10-CM | POA: Diagnosis not present

## 2021-06-02 DIAGNOSIS — J9601 Acute respiratory failure with hypoxia: Secondary | ICD-10-CM | POA: Diagnosis not present

## 2021-06-02 LAB — BASIC METABOLIC PANEL
Anion gap: 12 (ref 5–15)
BUN: 85 mg/dL — ABNORMAL HIGH (ref 8–23)
CO2: 22 mmol/L (ref 22–32)
Calcium: 9.5 mg/dL (ref 8.9–10.3)
Chloride: 105 mmol/L (ref 98–111)
Creatinine, Ser: 1.63 mg/dL — ABNORMAL HIGH (ref 0.44–1.00)
GFR, Estimated: 34 mL/min — ABNORMAL LOW (ref >60)
Glucose, Bld: 178 mg/dL — ABNORMAL HIGH (ref 70–99)
Potassium: 4.6 mmol/L (ref 3.5–5.1)
Sodium: 139 mmol/L (ref 135–145)

## 2021-06-02 LAB — CBC
HCT: 30.7 % — ABNORMAL LOW (ref 36.0–46.0)
Hemoglobin: 9.5 g/dL — ABNORMAL LOW (ref 12.0–15.0)
MCH: 28.3 pg (ref 26.0–34.0)
MCHC: 30.9 g/dL (ref 30.0–36.0)
MCV: 91.4 fL (ref 80.0–100.0)
Platelets: 449 10*3/uL — ABNORMAL HIGH (ref 150–400)
RBC: 3.36 MIL/uL — ABNORMAL LOW (ref 3.87–5.11)
RDW: 12 % (ref 11.5–15.5)
WBC: 9.4 10*3/uL (ref 4.0–10.5)
nRBC: 0.3 % — ABNORMAL HIGH (ref 0.0–0.2)

## 2021-06-02 LAB — GLUCOSE, CAPILLARY
Glucose-Capillary: 148 mg/dL — ABNORMAL HIGH (ref 70–99)
Glucose-Capillary: 150 mg/dL — ABNORMAL HIGH (ref 70–99)
Glucose-Capillary: 154 mg/dL — ABNORMAL HIGH (ref 70–99)
Glucose-Capillary: 156 mg/dL — ABNORMAL HIGH (ref 70–99)
Glucose-Capillary: 163 mg/dL — ABNORMAL HIGH (ref 70–99)
Glucose-Capillary: 173 mg/dL — ABNORMAL HIGH (ref 70–99)

## 2021-06-02 LAB — MAGNESIUM: Magnesium: 2.5 mg/dL — ABNORMAL HIGH (ref 1.7–2.4)

## 2021-06-02 IMAGING — DX DG CHEST 1V PORT
1 series · 1 of 1 positions shown · non-contrast
Comparison: [DATE]

CLINICAL DATA: Pain.

EXAM:
PORTABLE CHEST 1 VIEW

[view not recorded]
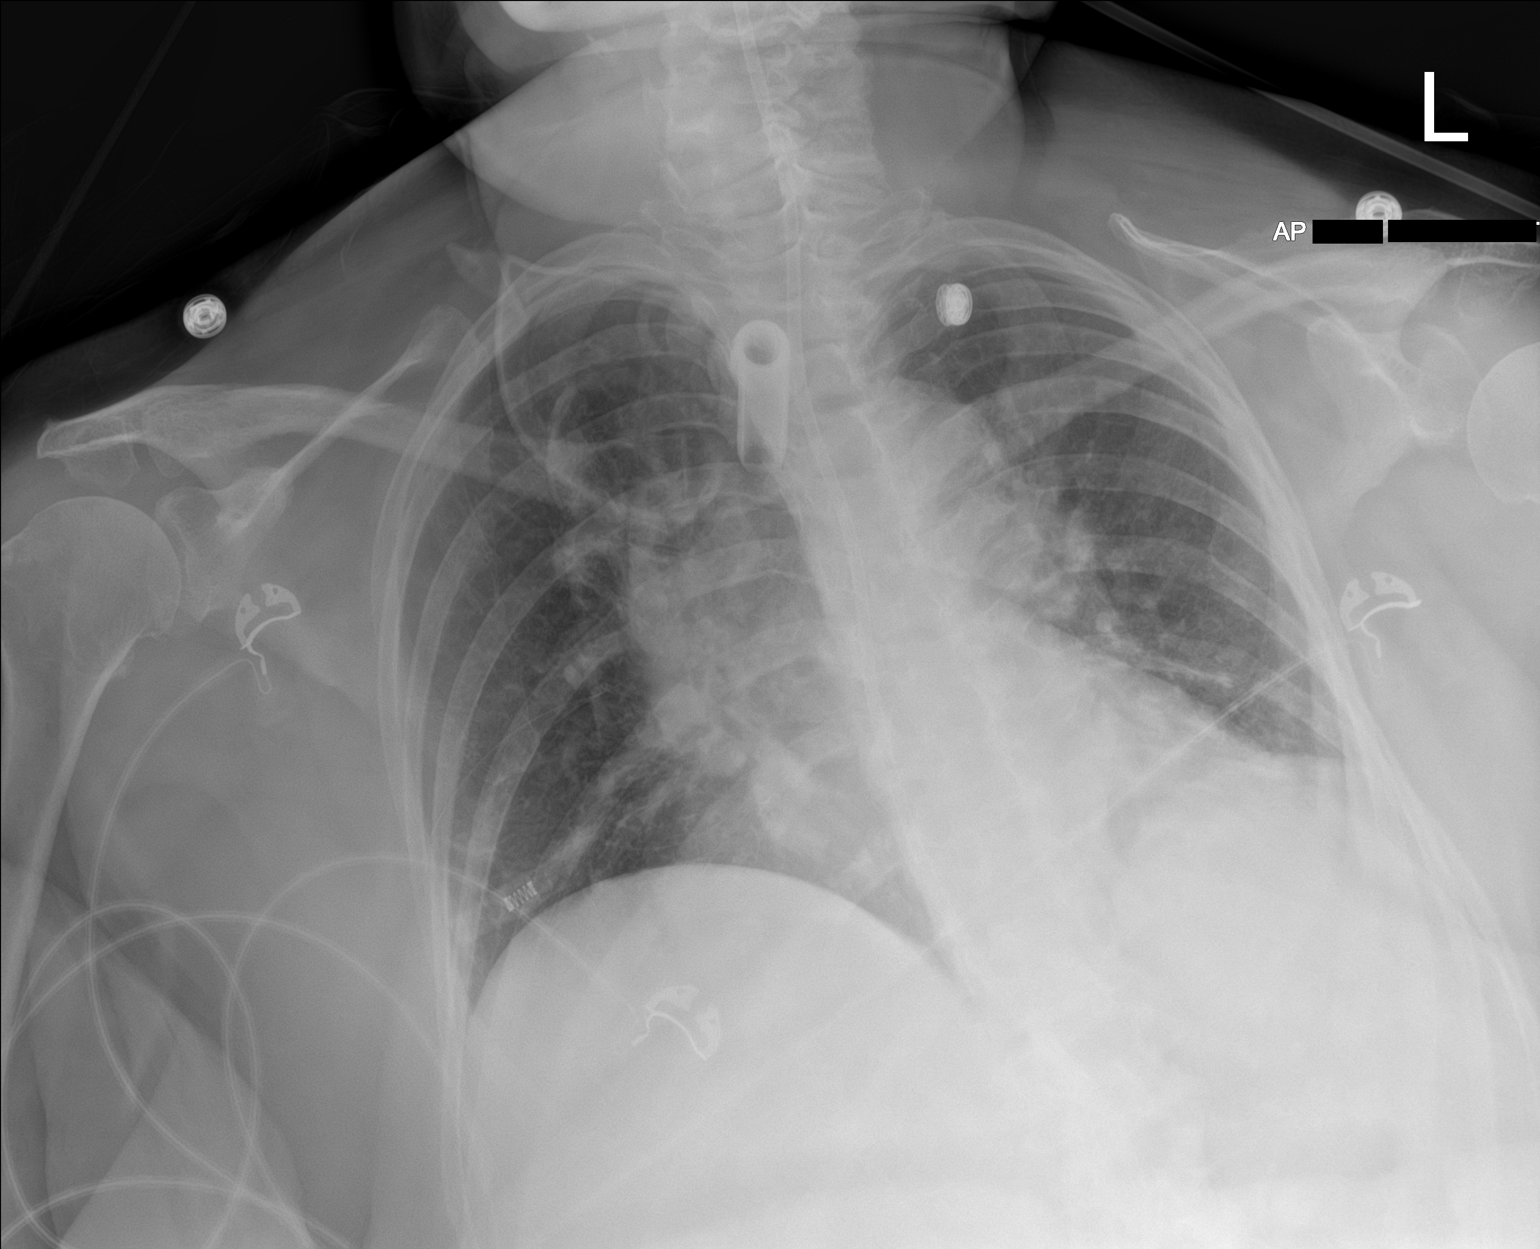

[1 of 1 positions shown; findings below may reference images not displayed]

FINDINGS: Stable tracheostomy tube. Feeding tube terminates below today's
study. No pneumothorax. The cardiomediastinal silhouette is stable.
The lungs are clear.
IMPRESSION: Support apparatus as above.  No acute interval changes.

## 2021-06-02 NOTE — Assessment & Plan Note (Signed)
-   Place PEG on Monday - TOC consult for long term placement  -Continue Keppra -Continue PPI

## 2021-06-02 NOTE — Assessment & Plan Note (Signed)
-  Continue glargine -Continue SS corrections  -Continue aspirin and lipitor

## 2021-06-02 NOTE — Assessment & Plan Note (Signed)
BP nomral -Continue Coreg, HCTZ, hydralazine

## 2021-06-02 NOTE — Progress Notes (Signed)
Progress Note    Kimberly Gross   NTI:[id number]  DOB: Apr 15, 1951  DOA: 05/13/2021     20 Date of Service: 06/02/2021     Brief summary: Kimberly Gross is a 70 y.o. F with DM, CKD IV, HTN, obesity found unresponsive by a family member.  Was severely hypogylcemic, but after improvement in blood sugar, remained unresponsive, required intubation for airway protection.   10/31 Obtunded and hypoglycemic for EMS, mentation did not improve with glucagon. CT head unremarkable.  Intubated and amitted to ICU  11/1 LP normal 11/2 Seizure noted on EEG, started Keppra 11/3 LTM EEG reviewed, correlating with metabolic encephalopathy rather than epileptiform activity 11/7 completed Unasyn for aspiration pneumonia  11/16 Trach placed 11/19 Transferred to [place]        Assessment and Plan Fever New fever to 101F today.  CXR clear.  WBC normal.   - Obtain urine - Follow WBC curve and fever curve  Persistent vegetative state ([place]) - Place PEG on Monday - TOC consult for long term placement  -Continue Keppra -Continue PPI  Essential hypertension BP nomral -Continue Coreg, HCTZ, hydralazine  Morbid (severe) obesity due to excess calories (HCC) BMI 42 -Continue PPI  Type 2 diabetes mellitus without complication, without long-term current use of insulin (HCC) -Continue glargine -Continue SS corrections  -Continue aspirin and lipitor     Subjective:  Fever overnight.  No other new concerns per nursing.  Patient is unresponsive.  Objective Vitals:   06/02/21 1156 06/02/21 1408 06/02/21 1534 06/02/21 1602  BP: 134/61 135/65  134/60  Pulse: 82  77 79  Resp: (!) 31  (!) 27 (!) 28  Temp:  99.9 F (37.7 C)  (!) 100.5 F (38.1 C)  TempSrc:  Axillary  Axillary  SpO2: 98%  99% 99%  Weight:      Height:       114.5 kg  Vital signs reviewed, notable for persistent tachypnea, heart rate and respiratory rate normal, pulse ox normal, fever to 101 this morning   Exam Physical  Exam Constitutional:      Appearance: Kimberly Gross is obese.  HENT:     Head: Normocephalic and atraumatic.     Nose: No nasal deformity or rhinorrhea.  Eyes:     General: Lids are normal. No scleral icterus.    Conjunctiva/sclera:     Right eye: Right conjunctiva is not injected. No chemosis.    Left eye: Left conjunctiva is not injected. No chemosis.    Comments: Right gaze preference  Cardiovascular:     Rate and Rhythm: Normal rate and regular rhythm.     Heart sounds: Normal heart sounds, S1 normal and S2 normal. No murmur heard. Pulmonary:     Breath sounds: Decreased breath sounds and rhonchi present. No wheezing or rales.  Abdominal:     General: Abdomen is flat.     Palpations: Abdomen is soft.     Tenderness: There is no abdominal tenderness. There is no guarding.  Musculoskeletal:     Right lower leg: No edema.     Left lower leg: No edema.  Neurological:     Mental Status: Kimberly Gross is unresponsive.     GCS: GCS eye subscore is 1. GCS verbal subscore is 1. GCS motor subscore is 1.       Labs / Other Information My review of labs, imaging, notes and other tests is significant for Hemoglobin stable at 9, chest x-ray from this morning unremarkable, creatinine stable at 1.6.  Glucose  good.     Disposition Plan: Status is: Inpatient  Remains inpatient appropriate because: Patient has a diffuse cerebral injury, and is now unresponsive and persistent vegetative state will be dependent on total cares likely for the rest of her life.  Ongoing efforts at disposition are in place.  called daughter by phone, no answer      Time spent: 25 minutes [facility] 06/02/2021, 4:43 PM

## 2021-06-02 NOTE — Assessment & Plan Note (Signed)
BMI 42 -Continue PPI

## 2021-06-02 NOTE — Assessment & Plan Note (Signed)
New fever to 101F today.  CXR clear.  WBC normal.   - Obtain urine - Follow WBC curve and fever curve

## 2021-06-03 DIAGNOSIS — R403 Persistent vegetative state: Secondary | ICD-10-CM | POA: Diagnosis not present

## 2021-06-03 DIAGNOSIS — G9340 Encephalopathy, unspecified: Secondary | ICD-10-CM | POA: Diagnosis not present

## 2021-06-03 DIAGNOSIS — Z7189 Other specified counseling: Secondary | ICD-10-CM | POA: Diagnosis not present

## 2021-06-03 DIAGNOSIS — S062XAA Diffuse traumatic brain injury with loss of consciousness status unknown, initial encounter: Secondary | ICD-10-CM | POA: Diagnosis not present

## 2021-06-03 DIAGNOSIS — J9601 Acute respiratory failure with hypoxia: Secondary | ICD-10-CM | POA: Diagnosis not present

## 2021-06-03 DIAGNOSIS — G9341 Metabolic encephalopathy: Secondary | ICD-10-CM | POA: Diagnosis not present

## 2021-06-03 DIAGNOSIS — Z515 Encounter for palliative care: Secondary | ICD-10-CM | POA: Diagnosis not present

## 2021-06-03 LAB — CBC
HCT: 27.5 % — ABNORMAL LOW (ref 36.0–46.0)
Hemoglobin: 8.6 g/dL — ABNORMAL LOW (ref 12.0–15.0)
MCH: 28.4 pg (ref 26.0–34.0)
MCHC: 31.3 g/dL (ref 30.0–36.0)
MCV: 90.8 fL (ref 80.0–100.0)
Platelets: 439 10*3/uL — ABNORMAL HIGH (ref 150–400)
RBC: 3.03 MIL/uL — ABNORMAL LOW (ref 3.87–5.11)
RDW: 12.2 % (ref 11.5–15.5)
WBC: 11.3 10*3/uL — ABNORMAL HIGH (ref 4.0–10.5)
nRBC: 0.4 % — ABNORMAL HIGH (ref 0.0–0.2)

## 2021-06-03 LAB — URINALYSIS, ROUTINE W REFLEX MICROSCOPIC
Bilirubin Urine: NEGATIVE
Glucose, UA: NEGATIVE mg/dL
Hgb urine dipstick: NEGATIVE
Ketones, ur: NEGATIVE mg/dL
Leukocytes,Ua: NEGATIVE
Nitrite: NEGATIVE
Protein, ur: NEGATIVE mg/dL
Specific Gravity, Urine: 1.014 (ref 1.005–1.030)
pH: 5 (ref 5.0–8.0)

## 2021-06-03 LAB — GLUCOSE, CAPILLARY
Glucose-Capillary: 155 mg/dL — ABNORMAL HIGH (ref 70–99)
Glucose-Capillary: 159 mg/dL — ABNORMAL HIGH (ref 70–99)
Glucose-Capillary: 160 mg/dL — ABNORMAL HIGH (ref 70–99)
Glucose-Capillary: 162 mg/dL — ABNORMAL HIGH (ref 70–99)
Glucose-Capillary: 168 mg/dL — ABNORMAL HIGH (ref 70–99)
Glucose-Capillary: 170 mg/dL — ABNORMAL HIGH (ref 70–99)

## 2021-06-03 LAB — BASIC METABOLIC PANEL
Anion gap: 10 (ref 5–15)
BUN: 91 mg/dL — ABNORMAL HIGH (ref 8–23)
CO2: 23 mmol/L (ref 22–32)
Calcium: 9.2 mg/dL (ref 8.9–10.3)
Chloride: 105 mmol/L (ref 98–111)
Creatinine, Ser: 1.66 mg/dL — ABNORMAL HIGH (ref 0.44–1.00)
GFR, Estimated: 33 mL/min — ABNORMAL LOW (ref >60)
Glucose, Bld: 175 mg/dL — ABNORMAL HIGH (ref 70–99)
Potassium: 4.7 mmol/L (ref 3.5–5.1)
Sodium: 138 mmol/L (ref 135–145)

## 2021-06-03 LAB — MAGNESIUM: Magnesium: 2.5 mg/dL — ABNORMAL HIGH (ref 1.7–2.4)

## 2021-06-03 MED ORDER — ENOXAPARIN SODIUM 60 MG/0.6ML IJ SOSY
60.0000 mg | PREFILLED_SYRINGE | INTRAMUSCULAR | Status: DC
  Administered 2021-06-03 – 2021-06-12 (×10): 60 mg via SUBCUTANEOUS
  Filled 2021-06-03 (×11): qty 0.6

## 2021-06-03 MED ORDER — SODIUM CHLORIDE 0.9 % IV SOLN: 1.0000 g | INTRAVENOUS | Status: DC

## 2021-06-03 MED ORDER — SODIUM CHLORIDE 0.9 % IV SOLN
2.0000 g | INTRAVENOUS | Status: DC
  Administered 2021-06-03: 2 g via INTRAVENOUS
  Filled 2021-06-03 (×2): qty 20

## 2021-06-03 MED ORDER — CHLORHEXIDINE GLUCONATE 0.12 % MT SOLN
OROMUCOSAL | Status: AC
  Administered 2021-06-03: 15 mL via OROMUCOSAL
  Filled 2021-06-03: qty 15

## 2021-06-03 NOTE — Assessment & Plan Note (Signed)
BMI 42 -Continue PPI

## 2021-06-03 NOTE — Progress Notes (Signed)
Palliative:  HPI: 70 yo female with past medical history of CKD stage IV, HTN, OSA on CPAP, diabetes, bipolar disorder admitted 05/13/21 with altered mental status and found to be hypoglycemic but no improvement after glucagon or Narcan. Required intubation. Unfortunately no improvement in mental status and concern for hypoglycemic brain injury. Trach placed 05/29/21.   Notified by Roselyn Reef, NP with IR that daughter, [name], was questioning PEG placement. I came to bedside to speak further with [facility] and joined by Dr. Loleta Books. [name] shares with me that her mother has not improved any since we last spoke. She and I reviewed decision for tracheostomy to give her more time to see if her mother would have any further improvement and [name] was not ready for consideration of comfort care. [name] shares with me "I think I'm going to have to let her go." [name] is able to tell us that her mother would not want to live this way and this is no quality of life. [name] shares that will have to speak with her children before making any final decisions and plans to have this discussion tonight. I offered [name] any assistance in conversations and provided her my contact number again to call with her decision and share with her children if they have any further questions. I discussed with [name] some of the expectations with comfort care with removal of Cortrak and focusing on symptom management instead of fixing numbers. [name] shared some with [place] about her mother's spirit.   All questions/concerns addressed. Emotional support provided.   Exam: Exam: Opens eyes spontaneously but nothing to command (no eye opening during todays visit). Does not follow commands. Trach to trach collar 5L. Abd soft. Cortrak in place.   Plan: - Family considering transition to comfort measures.   [place], NP Palliative Medicine Team Pager [phone] (Please see [email or web address] for schedule) Team Phone [phone]     Greater than 50%  of this time was spent counseling and coordinating care related to the above assessment and plan

## 2021-06-03 NOTE — Assessment & Plan Note (Signed)
Still fever today.  UA negative.  CXR clear.  No other obvious focal source. - Obtain blood cultures and start antibiotics pending cultures - Follow WBC curve and fever curve - Cehck procal

## 2021-06-03 NOTE — Assessment & Plan Note (Signed)
Creatinine stable.

## 2021-06-03 NOTE — Care Management Important Message (Signed)
Important Message  Patient Details  Name: Kimberly Gross MRN: [id number] Date of Birth: [birth year]   Medicare Important Message Given:  Yes     [name] 06/03/2021, 9:35 AM

## 2021-06-03 NOTE — Progress Notes (Signed)
IR requested to evaluate patient for gastrostomy tube placement. After meeting with palliative team and hospitalist today the family does not wish to pursue g-tube at this time. The order will be deleted. Please call IR if we can be of assistance in the future.  Soyla Dryer, [place] [phone] 06/03/2021, 1:29 PM

## 2021-06-03 NOTE — Progress Notes (Signed)
Speech Language Pathology Treatment: Nada Boozer Speaking valve  Patient Details Name: Kimberly Gross MRN: [id number] DOB: 08/18/1950 Today's Date: 06/03/2021 Time: 1216-2446 SLP Time Calculation (min) (ACUTE ONLY): 8 min  Assessment / Plan / Recommendation Clinical Impression  Pt was seen for PMV trials with presentation that appears to be similar to that which is described in initial evaluation although with her cuff now deflated at baseline. Her mentation is suboptimal and precludes her from following any commands to facilitate more functional PMV use. PMV was placed intermittently for intervals of ~60 seconds, during which she would have reflexive throat clearing that sounded wet, concerning for pooling of secretions. WOB and RR increased (up to 34), so PMV was not left donned for longer intervals. Recommend continued trials with SLP only at this point, with swallowing evaluation also on hold pending improved mentation for any PO trials.   HPI HPI: Pt is a 70 y/o female who presented to the ED after being found obtunded in bed by family member 10/31. Pt found to be severely hypoglycemic which improved with glucagon, but no improvement in mental status. Pt intubated for GCS 3; ETT 10/31-trach11/16. EEG 95/0: metabolic encephalopathy rather than epileptiform activity. CT head & MRI brain unremarkable, CBC and CMP also unremarkable. IR consulted for PEG 11/17.  PMH CKD IV, DM, bipolar disorder, HTN, OSA on CPAP      SLP Plan  Continue with current plan of care      Recommendations for follow up therapy are one component of a multi-disciplinary discharge planning process, led by the attending physician.  Recommendations may be updated based on patient status, additional functional criteria and insurance authorization.    Recommendations         Patient may use Passy-Muir Speech Valve: with SLP only MD: Please consider changing trach tube to : Cuffless;Smaller size         Oral Care  Recommendations: Oral care QID Follow Up Recommendations: Skilled nursing-short term rehab (<3 hours/day) Assistance recommended at discharge: Frequent or constant Supervision/Assistance SLP Visit Diagnosis: Aphonia (R49.1) Plan: Continue with current plan of care       GO                Osie Bond., M.A. [facility] Pager [phone] Office [phone]   06/03/2021, 10:46 AM

## 2021-06-03 NOTE — Assessment & Plan Note (Signed)
Due to hypoglycemia related diffuse cerebral injury  During goals of care discussion today with myself and palliative care, daughter expressed that her mother would not have wanted to live long-term in a nursing home, unable to communicate, walk, interact with family, and dependent upon a feeding tube and tracheostomy.  For now plans for PEG tube are on hold as this is consistent with her goals of care.  Daughter will discuss with other family, and we may cease tube feeds as this has not provided medical benefit to date, and may be prolonging her in a state against her wishes  -Continue Keppra -Continue PPI

## 2021-06-03 NOTE — Assessment & Plan Note (Signed)
Hgb stable 

## 2021-06-03 NOTE — Assessment & Plan Note (Signed)
Glucose normal -Continue glargine -Continue SS corrections  -Continue aspirin and Lipitor

## 2021-06-03 NOTE — Plan of Care (Signed)
  Problem: Health Behavior/Discharge Planning: Goal: Ability to manage health-related needs will improve Outcome: Progressing   Problem: Clinical Measurements: Goal: Will remain free from infection Outcome: Progressing   Problem: Clinical Measurements: Goal: Diagnostic test results will improve Outcome: Progressing   Problem: Clinical Measurements: Goal: Respiratory complications will improve Outcome: Progressing   

## 2021-06-03 NOTE — Progress Notes (Signed)
Progress Note    Kimberly Gross   CNO:[id number]  DOB: [birth year]  DOA: 05/13/2021     21 Date of Service: 06/03/2021       Brief summary: Kimberly Gross is a 69 y.o. F with DM, CKD IV, HTN, obesity found unresponsive by a family member.  Was severely hypogylcemic, but after improvement in blood sugar, remained unresponsive, required intubation for airway protection.   10/31 Obtunded and hypoglycemic for EMS, mentation did not improve with glucagon. CT head unremarkable.  Intubated and amitted to ICU  11/1 LP normal 11/2 Seizure noted on EEG, started Keppra 11/3 LTM EEG reviewed, correlating with metabolic encephalopathy rather than epileptiform activity 11/7 completed Unasyn for aspiration pneumonia  11/16 Trach placed 11/19 Transferred to [facility] 11/21 Daughter has had family discussions, feels that patient would not have wanted to continue in current state, goals of care discussions ongoing, PEG on hold        Assessment and Plan * Diffuse injury to cerebrum From hypoglycemia.  To date, no noticeable neurologic recovery.  Persistent vegetative state ([place]) Due to hypoglycemia related diffuse cerebral injury  During goals of care discussion today with myself and palliative care, daughter expressed that her mother would not have wanted to live long-term in a nursing home, unable to communicate, walk, interact with family, and dependent upon a feeding tube and tracheostomy.  For now plans for PEG tube are on hold as this is consistent with her goals of care.  Daughter will discuss with other family, and we may cease tube feeds as this has not provided medical benefit to date, and may be prolonging her in a state against her wishes  -Continue Keppra -Continue PPI  Fever Still fever today.  UA negative.  CXR clear.  No other obvious focal source. - Obtain blood cultures and start antibiotics pending cultures - Follow WBC curve and fever curve - Cehck procal  Anemia due to  stage 4 chronic kidney disease (HCC) Hgb stable  CKD (chronic kidney disease) stage 4, GFR 15-29 ml/min (HCC)  Creatinine stable  Essential hypertension BP normal -Continue Coreg, HCTZ, hydralazine  Morbid (severe) obesity due to excess calories (HCC) BMI 42 -Continue PPI  Type 2 diabetes mellitus without complication, without long-term current use of insulin (HCC) Glucose normal -Continue glargine -Continue SS corrections  -Continue aspirin and Lipitor     Subjective:  Recurrent fever today.  Patient is unresponsive.  No focal concerns from nursing.  No change in respiratory status.  No new injuries to the skin.  Objective Vitals:   06/03/21 1218 06/03/21 1506 06/03/21 1552 06/03/21 1642  BP: 122/60 129/72 132/69 138/75  Pulse: 75 75 79 79  Resp: (!) 25 (!) 25    Temp:   98.8 F (37.1 C)   TempSrc:   Axillary   SpO2: 98% 98% 98%   Weight:      Height:       114.8 kg  Fever, respiratory rate remains elevated, has no change, I do not believe the patient is septic   Exam Physical Exam Constitutional:      Appearance: She is obese.  HENT:     Head: Normocephalic and atraumatic.     Nose: No nasal deformity or rhinorrhea.  Eyes:     General: Lids are normal. No scleral icterus.    Conjunctiva/sclera:     Right eye: Right conjunctiva is not injected. No chemosis.    Left eye: Left conjunctiva is not injected. No chemosis.  Comments: Right gaze preference  Cardiovascular:     Rate and Rhythm: Normal rate and regular rhythm.     Heart sounds: Normal heart sounds, S1 normal and S2 normal. No murmur heard. Pulmonary:     Breath sounds: Decreased breath sounds and rhonchi present. No wheezing or rales.  Abdominal:     General: Abdomen is flat.     Palpations: Abdomen is soft.     Tenderness: There is no abdominal tenderness. There is no guarding.  Musculoskeletal:     Right lower leg: No edema.     Left lower leg: No edema.  Neurological:     Mental  Status: She is unresponsive.     GCS: GCS eye subscore is 1. GCS verbal subscore is 1. GCS motor subscore is 1.       Labs / Other Information My review of labs, imaging, notes and other tests is significant for Creatinine stable at 1.6, magnesium 2.5, hemoglobin stable at 8, white blood cell count slightly up to 11     Disposition Plan: Status is: Inpatient  Remains inpatient appropriate because: The patient has a diffuse cerebral injury and is now unresponsive and in a persistent vegetative state.  She will be dependent on total care is likely for the rest of her life.  Goals of care discussions are ongoing.  Family have expressed the opinion that she would not have wanted to live in her current state, which seems at this point very likely given her lack of neurological recovery.  She will discuss with other family members, we may see his tube feeds and pursue compassionate end-of-life care.        Time spent: 35 minutes [facility] 06/03/2021, 4:55 PM

## 2021-06-03 NOTE — Assessment & Plan Note (Signed)
BP normal -Continue Coreg, HCTZ, hydralazine

## 2021-06-03 NOTE — Assessment & Plan Note (Signed)
From hypoglycemia.  To date, no noticeable neurologic recovery.

## 2021-06-04 ENCOUNTER — Inpatient Hospital Stay (HOSPITAL_COMMUNITY): Payer: Medicare (Managed Care)

## 2021-06-04 DIAGNOSIS — S062XAA Diffuse traumatic brain injury with loss of consciousness status unknown, initial encounter: Secondary | ICD-10-CM

## 2021-06-04 DIAGNOSIS — J9602 Acute respiratory failure with hypercapnia: Secondary | ICD-10-CM | POA: Diagnosis not present

## 2021-06-04 DIAGNOSIS — G9340 Encephalopathy, unspecified: Secondary | ICD-10-CM | POA: Diagnosis not present

## 2021-06-04 DIAGNOSIS — G9341 Metabolic encephalopathy: Secondary | ICD-10-CM | POA: Diagnosis not present

## 2021-06-04 DIAGNOSIS — J9601 Acute respiratory failure with hypoxia: Secondary | ICD-10-CM | POA: Diagnosis not present

## 2021-06-04 LAB — CBC WITH DIFFERENTIAL/PLATELET
Abs Immature Granulocytes: 0.46 10*3/uL — ABNORMAL HIGH (ref 0.00–0.07)
Basophils Absolute: 0.1 10*3/uL (ref 0.0–0.1)
Basophils Relative: 0 %
Eosinophils Absolute: 0.8 10*3/uL — ABNORMAL HIGH (ref 0.0–0.5)
Eosinophils Relative: 7 %
HCT: 26.1 % — ABNORMAL LOW (ref 36.0–46.0)
Hemoglobin: 8.1 g/dL — ABNORMAL LOW (ref 12.0–15.0)
Immature Granulocytes: 4 %
Lymphocytes Relative: 29 %
Lymphs Abs: 3.5 10*3/uL (ref 0.7–4.0)
MCH: 28.1 pg (ref 26.0–34.0)
MCHC: 31 g/dL (ref 30.0–36.0)
MCV: 90.6 fL (ref 80.0–100.0)
Monocytes Absolute: 1.1 10*3/uL — ABNORMAL HIGH (ref 0.1–1.0)
Monocytes Relative: 9 %
Neutro Abs: 6.2 10*3/uL (ref 1.7–7.7)
Neutrophils Relative %: 51 %
Platelets: 392 10*3/uL (ref 150–400)
RBC: 2.88 MIL/uL — ABNORMAL LOW (ref 3.87–5.11)
RDW: 12.3 % (ref 11.5–15.5)
WBC: 12.1 10*3/uL — ABNORMAL HIGH (ref 4.0–10.5)
nRBC: 0.4 % — ABNORMAL HIGH (ref 0.0–0.2)

## 2021-06-04 LAB — BLOOD CULTURE ID PANEL (REFLEXED) - BCID2

## 2021-06-04 LAB — BLOOD GAS, ARTERIAL
Acid-base deficit: 2.1 mmol/L — ABNORMAL HIGH (ref 0.0–2.0)
Bicarbonate: 22 mmol/L (ref 20.0–28.0)
FIO2: 28
O2 Saturation: 95.2 %
Patient temperature: 37.6
pCO2 arterial: 37.6 mmHg (ref 32.0–48.0)
pH, Arterial: 7.388 (ref 7.350–7.450)
pO2, Arterial: 79.9 mmHg — ABNORMAL LOW (ref 83.0–108.0)

## 2021-06-04 LAB — GLUCOSE, CAPILLARY
Glucose-Capillary: 167 mg/dL — ABNORMAL HIGH (ref 70–99)
Glucose-Capillary: 167 mg/dL — ABNORMAL HIGH (ref 70–99)
Glucose-Capillary: 168 mg/dL — ABNORMAL HIGH (ref 70–99)
Glucose-Capillary: 170 mg/dL — ABNORMAL HIGH (ref 70–99)
Glucose-Capillary: 177 mg/dL — ABNORMAL HIGH (ref 70–99)
Glucose-Capillary: 182 mg/dL — ABNORMAL HIGH (ref 70–99)

## 2021-06-04 LAB — BASIC METABOLIC PANEL
Anion gap: 9 (ref 5–15)
BUN: 102 mg/dL — ABNORMAL HIGH (ref 8–23)
CO2: 23 mmol/L (ref 22–32)
Calcium: 8.8 mg/dL — ABNORMAL LOW (ref 8.9–10.3)
Chloride: 105 mmol/L (ref 98–111)
Creatinine, Ser: 1.67 mg/dL — ABNORMAL HIGH (ref 0.44–1.00)
GFR, Estimated: 33 mL/min — ABNORMAL LOW (ref >60)
Glucose, Bld: 201 mg/dL — ABNORMAL HIGH (ref 70–99)
Potassium: 4.6 mmol/L (ref 3.5–5.1)
Sodium: 137 mmol/L (ref 135–145)

## 2021-06-04 LAB — URINE CULTURE: Culture: NO GROWTH

## 2021-06-04 LAB — BRAIN NATRIURETIC PEPTIDE: B Natriuretic Peptide: 195.6 pg/mL — ABNORMAL HIGH (ref 0.0–100.0)

## 2021-06-04 IMAGING — DX DG CHEST 1V
1 series · 1 of 1 positions shown · non-contrast
Comparison: Radiograph 2 days ago.

CLINICAL DATA: Shortness of breath.

EXAM:
CHEST  1 VIEW

[chest]
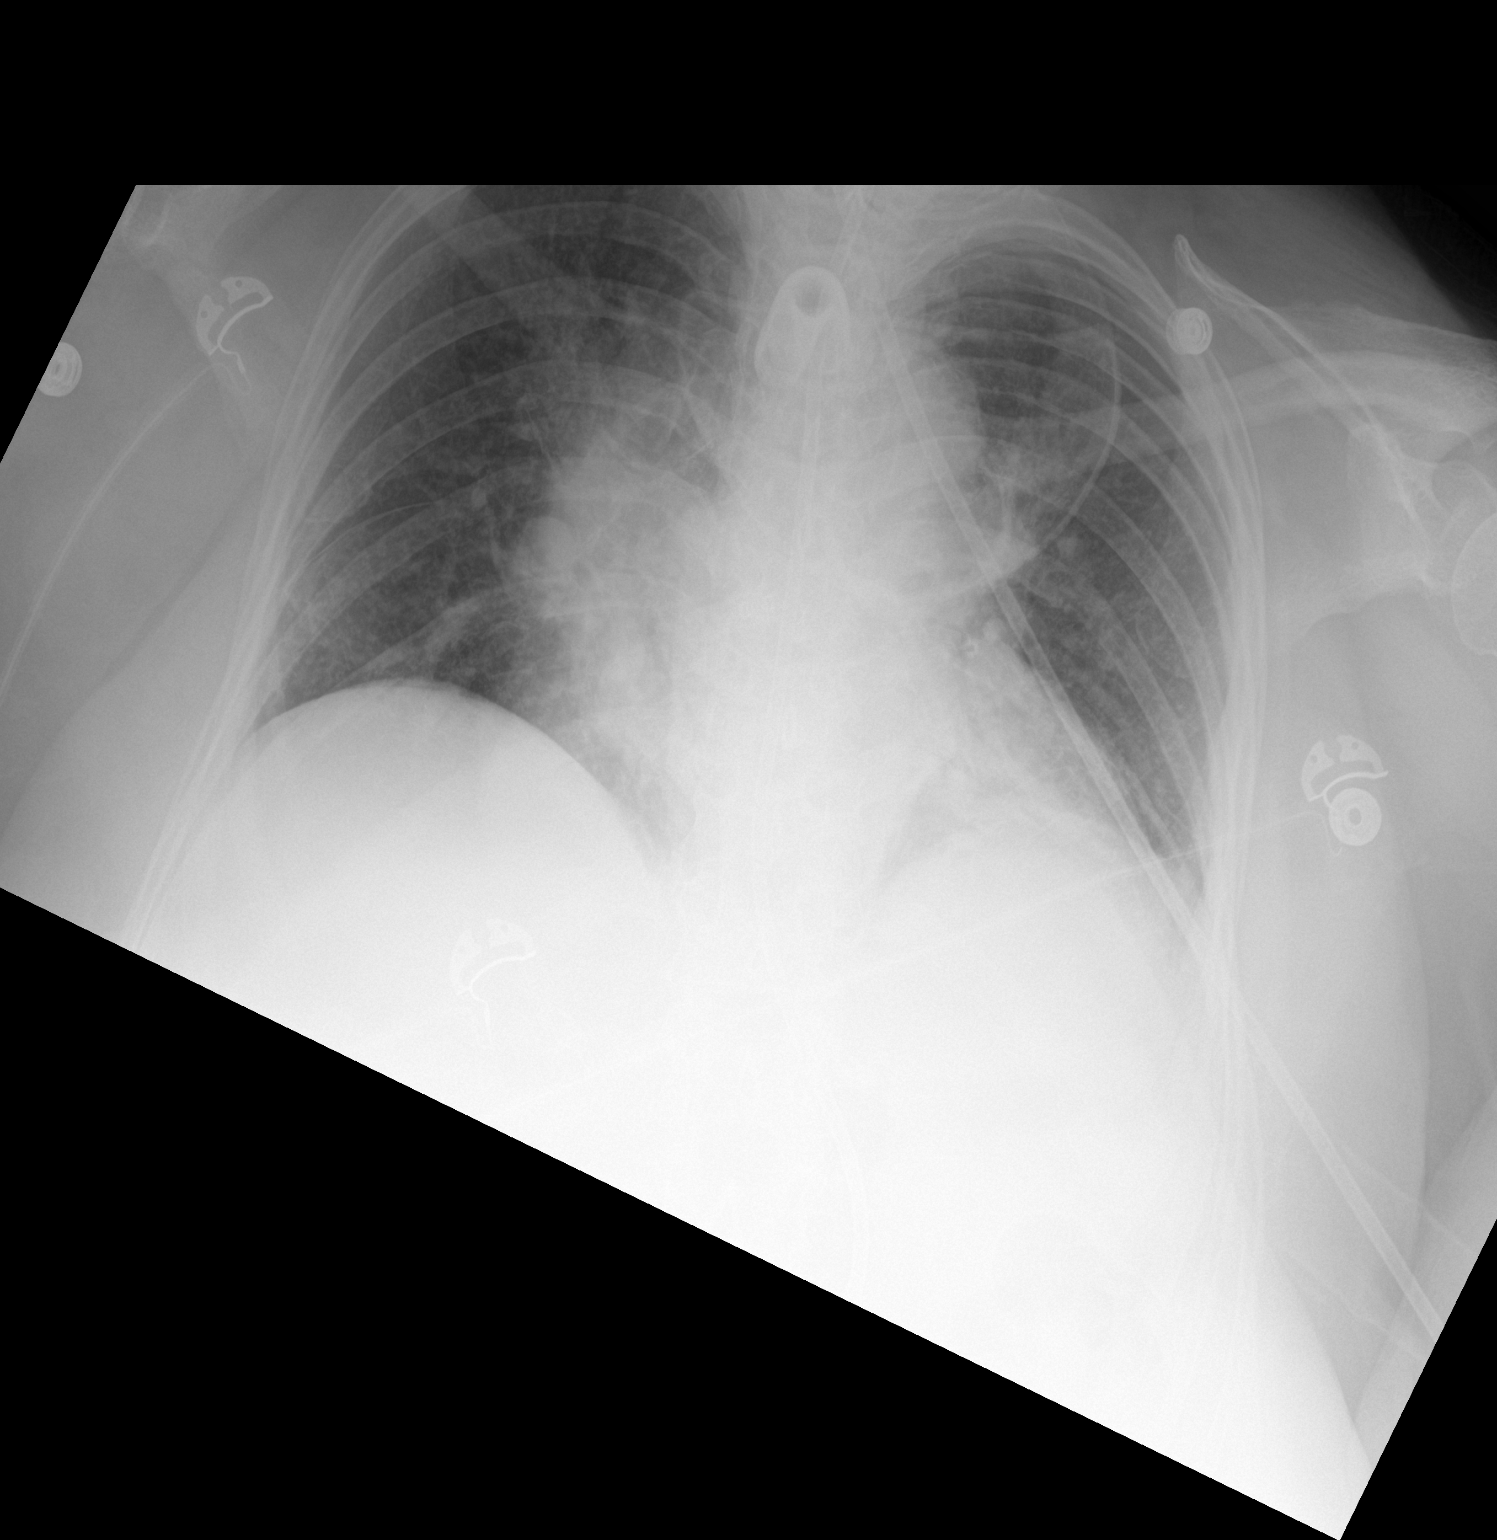

[1 of 1 positions shown; findings below may reference images not displayed]

FINDINGS: Lower lung volumes from prior exam. Anti lordotic positioning.
Tracheostomy tube tip remains at the thoracic inlet. Weighted
enteric tube in place with tip not included in the field of view.
Prominent heart size likely accentuated by low lung volumes.
Increasing peribronchial thickening. No convincing pleural effusion.
Streaky bibasilar opacities.
IMPRESSION: 1. Lower lung volumes from prior exam with increasing peribronchial
thickening. This may be bronchitic or congestive.
2. Streaky 80 bibasilar atelectasis.
3. Stable tracheostomy tube. Weighted enteric tube with tip below
the field of view.

## 2021-06-04 MED ORDER — VANCOMYCIN HCL 1500 MG/300ML IV SOLN
1500.0000 mg | INTRAVENOUS | Status: DC
  Administered 2021-06-06 – 2021-06-10 (×3): 1500 mg via INTRAVENOUS
  Filled 2021-06-04 (×3): qty 300

## 2021-06-04 MED ORDER — MORPHINE SULFATE (PF) 2 MG/ML IV SOLN
2.0000 mg | Freq: Once | INTRAVENOUS | Status: AC
  Administered 2021-06-04: 2 mg via INTRAVENOUS
  Filled 2021-06-04: qty 1

## 2021-06-04 MED ORDER — SODIUM CHLORIDE 0.9 % IV SOLN
2500.0000 mg | Freq: Once | INTRAVENOUS | Status: AC
  Administered 2021-06-04: 2500 mg via INTRAVENOUS
  Filled 2021-06-04: qty 2500

## 2021-06-04 NOTE — Progress Notes (Signed)
Pharmacy Antibiotic Note  Kimberly Gross is a 70 y.o. female  with MRSE bacteremia.  Pharmacy has been consulted for vancomycin dosing. -WBC= 11.3, tmax= 100.9 -SCr= 1.66  Plan: Vancomycin 2500mg  x1 followed by 1500mg  IV q48hr (estimated AUC= 505) -Will follow renal function, vancomycin levels, cultures and clinical progress   Height: 5\' 3"  (160 cm) Weight: 114.4 kg (252 lb 3.3 oz) IBW/kg (Calculated) : 52.4  Temp (24hrs), Avg:99.4 F (37.4 C), Min:98 F (36.7 C), Max:100.3 F (37.9 C)  Recent Labs  Lab 05/30/21 0220 05/31/21 0208 06/01/21 0047 06/02/21 0242 06/03/21 0213  WBC 9.6 10.5 9.3 9.4 11.3*  CREATININE 1.70* 1.58* 1.55* 1.63* 1.66*    Estimated Creatinine Clearance: 38.4 mL/min (A) (by C-G formula based on SCr of 1.66 mg/dL (H)).    Allergies  Allergen Reactions   Chlorpromazine Rash    Antimicrobials this admission: Vanc 10/31 >> 11/2 Ceftriaxone 10/31 >> 11/2; resumed 11/21 >> Ampicillin 10/31 >> 11/2 Acyclovir 10/31 >> 11/2 Unasyn 11/2 >> 11/7  Dose adjustments this admission:   Microbiology results: 10/31 BCx: negative 10/31 UCx: needs recollection, multiple species present  10/31 MRSA PCR: not detected 11/1 CSF: no organisms seen, negative 11/1 HSV negative 11/1 TA: normal resp flora 11/20 urine: neg 11/21 blood: GPC/clusters in 2/4 bottles. BCID with staph epi ([place] +)  Thank you for allowing pharmacy to be a part of this patient's care.  Hildred Laser, PharmD Clinical Pharmacist **Pharmacist phone directory can now be found on [email or web address] ([email or web address]).  Listed under [place].

## 2021-06-04 NOTE — Progress Notes (Signed)
   06/04/21 1925  Assess: MEWS Score  BP (!) 122/54  Pulse Rate 78  Resp (!) 32  Level of Consciousness Responds to Pain  SpO2 97 %  O2 Device Tracheostomy Collar  O2 Flow Rate (L/min) 5 L/min  FiO2 (%) 28 %  Assess: MEWS Score  MEWS Temp 0  MEWS Systolic 0  MEWS Pulse 0  MEWS RR 2  MEWS LOC 2  MEWS Score 4  MEWS Score Color Red  Assess: if the MEWS score is Yellow or Red  Were vital signs taken at a resting state? Yes  Focused Assessment Change from prior assessment (see assessment flowsheet)  Early Detection of Sepsis Score *See Row Information* High  MEWS guidelines implemented *See Row Information* Yes  Treat  MEWS Interventions Administered prn meds/treatments;Escalated (See documentation below);Consulted Respiratory Therapy  Pain Score 0  Take Vital Signs  Increase Vital Sign Frequency  Red: Q 1hr X 4 then Q 4hr X 4, if remains red, continue Q 4hrs  Escalate  MEWS: Escalate Red: discuss with charge nurse/RN and provider, consider discussing with RRT  Notify: Charge Nurse/RN  Name of Charge Nurse/RN Notified [place] cn  Date Charge Nurse/RN Notified 06/04/21  Time Charge Nurse/RN Notified 1945  Notify: Provider  Provider Name/Title [facility]  Date Provider Notified 06/04/21  Time Provider Notified 2005  Notification Type Page  Notification Reason Change in status  Provider response See new orders;Other (Comment) (Chest Xray, ABG, blood works.)  Date of Provider Response 06/04/21  Time of Provider Response 2010  Notify: Rapid Response  Name of Rapid Response RN Notified Mindy  Date Rapid Response Notified 06/04/21  Time Rapid Response Notified 2030  Document  Patient Outcome Stabilized after interventions  Progress note created (see row info) Yes  -Suction, Looks better, RR slowed down.

## 2021-06-04 NOTE — Assessment & Plan Note (Signed)
From hypoglycemia.  To date, no noticeable neurologic recovery.

## 2021-06-04 NOTE — Assessment & Plan Note (Signed)
BMI 42 -Continue PPI

## 2021-06-04 NOTE — Progress Notes (Signed)
Progress Note    Kimberly Gross   BSJ:[id number]  DOB: 1951-03-28  DOA: 05/13/2021     22 Date of Service: 06/04/2021      Brief summary: [name] is a 70 y.o. F with DM, CKD IV, HTN, obesity found unresponsive by a family member.  Was severely hypogylcemic, but after improvement in blood sugar, remained unresponsive, required intubation for airway protection.   10/31 Obtunded and hypoglycemic for EMS, mentation did not improve with glucagon. CT head unremarkable.  Intubated and amitted to ICU; started on Unasyn for aspiration pneumonia 11/1 LP normal 11/2 Seizures noted on EEG, started Keppra 11/3 LTM EEG reviewed, correlating with metabolic encephalopathy rather than epileptiform activity 11/4-11/15 No improvement in neurological function 11/16 Trach placed 11/19 Transferred to [facility] 11/21 In preparation for PEG placement and transfer to long term care, goals of care discussion with daughter, TRH and Palliative care; Daughter feels that patient would not have wanted to continue in current state, PEG on hold, daughter will discuss with family, may pursue Hospice 11/22 Fever again --> Blood cultures with MRSE; started vancomycin, ID consulted        Assessment and Plan * Diffuse injury to cerebrum From hypoglycemia.  To date, no noticeable neurologic recovery.  Persistent vegetative state ([place]) Due to hypoglycemia related diffuse cerebral injury  During goals of care discussion 11/21 with myself and palliative care, daughter expressed that her mother would not have wanted to live long-term in a nursing home, unable to communicate, walk, interact with family, and dependent upon a feeding tube and tracheostomy.  For now plans for PEG tube are on hold as PEG and lifelong vegetative state in SNF is not clearly consistent with her goals of care.  Daughter will discuss with other family, and we may cease tube feeds as this has not provided medical benefit to date, and may be  prolonging her in a state against her wishes  -Continue Keppra -Continue PPI -Consult Palliative Care  Staphylococcus epidermidis bacteremia Persistent fever last 2 days, CXR and UA clear.  Blood cultures obtained yesterday growing MRSE in both sets.  All PIVs look okay, not likely the source.  She has a stage I pressure ulcer which may really just be some MASD not a pressure ulcer, which may be the source?  Versus her trach?  - Start vancomycin - Consult ID    Pressure injury of skin Stage 2 left buttocks, not POA - Local wound care  Anemia due to stage 4 chronic kidney disease (HCC) Hgb stable  Bipolar 1 disorder, mixed, moderate (HCC)    CKD (chronic kidney disease) stage 4, GFR 15-29 ml/min (HCC)  Creatinine stable  Essential hypertension BP normal -Continue Coreg, HCTZ, hydralazine  Morbid (severe) obesity due to excess calories (HCC) BMI 42 -Continue PPI  Type 2 diabetes mellitus without complication, without long-term current use of insulin (HCC) Glucose normal -Continue glargine -Continue SS corrections  -Continue aspirin and Lipitor     Subjective:  Some fever overnight, some increased respiratory rate at times.  No swelling at her PIV sites.  No purulent drainage from her tracheostomy.  No obvious cellulitis at her stage I left buttocks.  Objective Vitals:   06/04/21 1010 06/04/21 1115 06/04/21 1149 06/04/21 1700  BP: 122/60 (!) 111/54  (!) 118/56  Pulse:  75 77 79  Resp:   (!) 24 20  Temp:  100.2 F (37.9 C)  (!) 100.9 F (38.3 C)  TempSrc:  Oral  Axillary  SpO2:  99% 99%   Weight:      Height:       114.4 kg  Vital signs were reviewed    Exam Physical Exam Constitutional:      Appearance: She is obese.  HENT:     Head: Normocephalic and atraumatic.     Nose: No nasal deformity or rhinorrhea.  Eyes:     General: Lids are normal. No scleral icterus.    Conjunctiva/sclera:     Right eye: Right conjunctiva is not injected. No  chemosis.    Left eye: Left conjunctiva is not injected. No chemosis.    Comments: Right gaze preference  Cardiovascular:     Rate and Rhythm: Normal rate and regular rhythm.     Heart sounds: Normal heart sounds, S1 normal and S2 normal. No murmur heard. Pulmonary:     Breath sounds: Decreased breath sounds and rhonchi present. No wheezing or rales.  Abdominal:     General: Abdomen is flat.     Palpations: Abdomen is soft.     Tenderness: There is no abdominal tenderness. There is no guarding.  Musculoskeletal:     Right lower leg: No edema.     Left lower leg: No edema.  Neurological:     Mental Status: She is unresponsive.     GCS: GCS eye subscore is 1. GCS verbal subscore is 1. GCS motor subscore is 1.       Labs / Other Information My review of labs, imaging, notes and other tests shows no new significant findings.   Other than bacteremia.  Disposition Plan: Status is: Inpatient  Remains inpatient appropriate because: She has had diffuse cerebral injury due to hypoglycemia, and is in a persistent vegetative state.  Prospects for her neurological recovery are very poor.  This has been shared with family, including the high likelihood that she will be currently nonverbal, bedbound in a nursing home with a feeding tube and tracheostomy.  They are unsure that this is consistent with her goals of care, and are working with palliative care.  Over the next few days, we expect family to make the decision whether to cease tube feeds and pursue compassionate hospice care at end-of-life, versus continue with PEG  and pursuing long-term placement.   Caled to daughter, left VM     Time spent: 35 minutes [facility] 06/04/2021, 7:03 PM

## 2021-06-04 NOTE — Assessment & Plan Note (Signed)
Hgb stable 

## 2021-06-04 NOTE — TOC Progression Note (Signed)
Transition of Care [facility]) - Progression Note    Patient Details  Name: Kimberly Gross MRN: [id number] Date of Birth: 1951-07-07  [facility]) CM/SW Contact  Zenon Mayo, RN Phone Number: 06/04/2021, 3:48 PM  Clinical Narrative:    Patient is from home with family, severe hypoglycemia, unresponsive, required intubation, now with new trach (11/16) at 28%, family does not want patient to get peg tube, [place] discussion is ongoing, fever yesterday, start abx, peg on hold, daughter will speak with other family members regarding the peg tube. She is total care.  TOC will continue to follow for dc needs.      Barriers to Discharge: Continued Medical Work up  Expected Discharge Plan and Services   In-house Referral: Clinical Social Work Discharge Planning Services: CM Consult   Living arrangements for the past 2 months: Single Family Home                                       Social Determinants of Health (SDOH) Interventions    Readmission Risk Interventions No flowsheet data found.

## 2021-06-04 NOTE — Progress Notes (Signed)
Palliative:  I came to bedside but no family/visitors present. I called and left voicemail for [facility] but no answer or return call. No decisions made at this stage except to hold on PEG placement. I will continue efforts to follow up with [name] to clarify goals of care.   No charge  Vinie Sill, NP Palliative Medicine Team Pager [phone] (Please see [email or web address] for schedule) Team Phone [phone]

## 2021-06-04 NOTE — Progress Notes (Signed)
Trach sutures removed per MD order.

## 2021-06-04 NOTE — Progress Notes (Addendum)
NAME:  Kimberly Gross, MRN:  [id number], DOB:  July 07, 1951, LOS: 66 ADMISSION DATE:  05/13/2021, CONSULTATION DATE:  05/13/21 REFERRING MD:  Ronnald Nian - EM, CHIEF COMPLAINT:  AMS    History of Present Illness:  70 yo F PMH CKD IV, DM, bipolar disorder, HTN, Osa on CPAP who was found obtunded in bed by family member 10/31 afternoon found to be severely hypoglycemic which improved with glucagon but no improvement in mental status. Intubated for GCS 3. CT head unremarkable, CBC and CMP also unremarkable.  Pertinent  Medical History  CKD IV Bipolar Disorder DM2  HLD Anxiety HTN OSA on CPAP  Significant Hospital Events: Including procedures, antibiotic start and stop dates in addition to other pertinent events   10/31 Obtunded and hypoglycemic for EMS, mentation did not improve with glucagon. Intubated in ED, CT H non acute. Admitting to PCCM  11/1 LP performed by IR, CSF clear with no organisms on gram stain 11/2 routine EEG read as seizure activity, loaded with levetiracetam 11/3 LTM EEG reviewed, correlating with metabolic encephalopathy rather than epileptiform activity 11/5 no acute events overnight, remains on continuous EEG with no signs of seizures, did not wean on MV 11/7 completed course of ampicillin-sulbactam for aspiration pneumonia 11/16 Trach  Interim History / Subjective:  T Max 100.9 Family considering transition to comfort care Day 7 post trach, will order d/c of trach sutures  Objective   Blood pressure (!) 139/56, pulse 80, temperature 100.3 F (37.9 C), temperature source Axillary, resp. rate (!) 28, height 5\' 3"  (1.6 m), weight 114.4 kg, SpO2 100 %.    FiO2 (%):  [28 %] 28 %   Intake/Output Summary (Last 24 hours) at 06/04/2021 0816 Last data filed at 06/04/2021 0546 Gross per 24 hour  Intake --  Output 1100 ml  Net -1100 ml   Filed Weights   06/02/21 0400 06/03/21 0530 06/04/21 0545  Weight: 114.5 kg 114.8 kg 114.4 kg    Examination:   Physical  exam: General: Crtitically ill-appearing elderly female, on ATC, unresponsive HEENT: [place]/AT, eyes anicteric.  Cortrak in place, thick neck, Trach with sutures, some scabbing around site, blood tinged thick secretions  Neuro: Eyes open, does not track , does not follow commands, no movement to painful stimuli in all extremities Chest: Bilateral chest excursion, Coarse breath sounds, no wheezes + rhonchi Heart: Regular rate and rhythm, no murmurs or gallops, SR with 1st degree AV Block per tele Abdomen: Soft, nontender, nondistended, bowel sounds present Skin: No rash, no lesions , warm dry and intact with exception of surgical trach    Patient Lines/Drains/Airways Status     Active Line/Drains/Airways     Name Placement date Placement time Site Days   Peripheral IV 05/15/21 20 G 2.5" Anterior;Right Forearm 05/15/21  0613  Forearm  14   External Urinary Catheter 05/15/21  1743  --  14   Tracheostomy [place]iley Flexible 8 mm Cuffed 05/28/21  1501  8 mm  1   Wound / Incision (Open or Dehisced) 05/20/21 Skin tear Back Left;Mid 05/20/21  0800  Back  9   Wound / Incision (Open or Dehisced) 05/20/21 Skin tear Back Right;Mid 05/20/21  0800  Back  9            Resolved Hospital Problem list    Possible aspiration pneumonia Left hip external rotation - hip fracture ruled out Hyperkalemia Hyponatremia AKI on CKD4 Metabolic acidosis   Assessment & Plan:   Acute respiratory failure with hypoxia requiring  mechanical ventilation- due to poor secretion clearance ability in setting of profound CNS injury Tolerating  ATC , currently on 28% with sats of 100% Continue trach collar as tolerated Wean  FiO2 s able for sats > 94%  Trach 11/16 Day 7 Post procedure bleeding-resolved,  Plan Remove sutures ( order written)  continue routine trach care  Dysphagia- Plan for PEG cancelled as family considering transition to comfort care per 11/21 Palliative Care note Plan Continue Cor Track and  Tube feeds Appreciate Palliation assistance   Best Practice (right click and "Reselect all SmartList Selections" daily)   Diet/type: tubefeeds and NPO DVT prophylaxis: prophylactic heparin  GI prophylaxis: PPI Lines: N/A Foley:  N/A Code Status:  full code Last date of multidisciplinary goals of care discussion [11/18] Dr. Tacy Learn spoke with patient's daughter and updated about her condition.  She has subsequently spoken with Palliative care  11/21 and is discussing transition to comfort care with family.  Rest  of care per Primary Team  PCCM will continue to follow for trach management  Magdalen Spatz, MSN, AGACNP-BC [place] for personal pager PCCM on call pager [phone]  06/04/2021 8:20 AM

## 2021-06-04 NOTE — Assessment & Plan Note (Signed)
Persistent fever last 2 days, CXR and UA clear.  Blood cultures obtained yesterday growing MRSE in both sets.  All PIVs look okay, not likely the source.  She has a stage I pressure ulcer which may really just be some MASD not a pressure ulcer, which may be the source?  Versus her trach?  - Start vancomycin - Consult ID

## 2021-06-04 NOTE — Assessment & Plan Note (Signed)
Glucose normal -Continue glargine -Continue SS corrections  -Continue aspirin and Lipitor

## 2021-06-04 NOTE — Assessment & Plan Note (Signed)
Stage 2 left buttocks, not POA - Local wound care

## 2021-06-04 NOTE — Assessment & Plan Note (Signed)
Creatinine stable.

## 2021-06-04 NOTE — Progress Notes (Signed)
PHARMACY - PHYSICIAN COMMUNICATION CRITICAL VALUE ALERT - BLOOD CULTURE IDENTIFICATION (BCID)  Kimberly Gross is an 70 y.o. female with fever   Assessment:  Blood cultures show GPC in clusters (2/4 bottles) and BCID with staph epi ([place] +)  Name of physician (or Provider) Contacted: Dr. Loleta Books  Current antibiotics: Rocephin  Changes to prescribed antibiotics recommended: Add vancomycin Recommendations accepted by provider  Results for orders placed or performed during the hospital encounter of 05/13/21  Blood Culture ID Panel (Reflexed) (Collected: 06/03/2021  6:02 PM)  Result Value Ref Range   Enterococcus faecalis NOT DETECTED NOT DETECTED   Enterococcus Faecium NOT DETECTED NOT DETECTED   Listeria monocytogenes NOT DETECTED NOT DETECTED   Staphylococcus species DETECTED (A) NOT DETECTED   Staphylococcus aureus (BCID) NOT DETECTED NOT DETECTED   Staphylococcus epidermidis DETECTED (A) NOT DETECTED   Staphylococcus lugdunensis NOT DETECTED NOT DETECTED   Streptococcus species NOT DETECTED NOT DETECTED   Streptococcus agalactiae NOT DETECTED NOT DETECTED   Streptococcus pneumoniae NOT DETECTED NOT DETECTED   Streptococcus pyogenes NOT DETECTED NOT DETECTED   A.calcoaceticus-baumannii NOT DETECTED NOT DETECTED   Bacteroides fragilis NOT DETECTED NOT DETECTED   Enterobacterales NOT DETECTED NOT DETECTED   Enterobacter cloacae complex NOT DETECTED NOT DETECTED   Escherichia coli NOT DETECTED NOT DETECTED   Klebsiella aerogenes NOT DETECTED NOT DETECTED   Klebsiella oxytoca NOT DETECTED NOT DETECTED   Klebsiella pneumoniae NOT DETECTED NOT DETECTED   Proteus species NOT DETECTED NOT DETECTED   Salmonella species NOT DETECTED NOT DETECTED   Serratia marcescens NOT DETECTED NOT DETECTED   Haemophilus influenzae NOT DETECTED NOT DETECTED   Neisseria meningitidis NOT DETECTED NOT DETECTED   Pseudomonas aeruginosa NOT DETECTED NOT DETECTED   Stenotrophomonas maltophilia NOT DETECTED  NOT DETECTED   Candida albicans NOT DETECTED NOT DETECTED   Candida auris NOT DETECTED NOT DETECTED   Candida glabrata NOT DETECTED NOT DETECTED   Candida krusei NOT DETECTED NOT DETECTED   Candida parapsilosis NOT DETECTED NOT DETECTED   Candida tropicalis NOT DETECTED NOT DETECTED   Cryptococcus neoformans/gattii NOT DETECTED NOT DETECTED   Methicillin resistance mecA/C DETECTED (A) NOT DETECTED   Hildred Laser, PharmD Clinical Pharmacist **Pharmacist phone directory can now be found on [email or web address] ([email or web address]).  Listed under [place].

## 2021-06-04 NOTE — Assessment & Plan Note (Signed)
Due to hypoglycemia related diffuse cerebral injury  During goals of care discussion 11/21 with myself and palliative care, daughter expressed that her mother would not have wanted to live long-term in a nursing home, unable to communicate, walk, interact with family, and dependent upon a feeding tube and tracheostomy.  For now plans for PEG tube are on hold as PEG and lifelong vegetative state in SNF is not clearly consistent with her goals of care.  Daughter will discuss with other family, and we may cease tube feeds as this has not provided medical benefit to date, and may be prolonging her in a state against her wishes  -Continue Keppra -Continue PPI -Consult Palliative Care

## 2021-06-04 NOTE — Assessment & Plan Note (Signed)
BP normal -Continue Coreg, HCTZ, hydralazine

## 2021-06-04 NOTE — Progress Notes (Addendum)
HOSPITAL MEDICINE OVERNIGHT EVENT NOTE    Notified by nursing that patient has been exhibiting progressively increasing tachypnea and respiratory distress over the past several hours.  They report an increasing oxygen requirement over the span of time and patient is currently on 28% FiO2 via trach collar.  Nursing rest ports that patient is exhibiting bilateral rales on examination with diminished breath sounds in the bases.  Nursing reports that respiratory has suctioned significant amounts of thick secretions via flexible suction.  Due to increasing respiratory efforts and oxygen requirement according to nursing we will go ahead and obtain chest x-ray and ABG and reassess.  Vernelle Emerald  MD [facility]   ADDENDUM (11/22 10:15PM)  ABG revealing pH of 7.38 with PCO2 of 37.6 and PO2 of 79.9.  Chest x-ray revealing what seems to be increasing perihilar infiltrates, which could be secondary to early pulmonary edema or an infectious process.  Patient is currently receiving intravenous vancomycin for staph epidermidis bacteremia.  Upon review of the chart, it seems the patient had an increasing leukocytosis on 11/21 and has continued to spike fevers, with last fever being 100.9 F at 5 PM on 11/22.  We will obtain a CBC and procalcitonin to evaluate if this supports the possibility of developing infection on chest x-ray.  Additionally obtaining BNP and if this is markedly elevated that would more likely support pulmonary edema.  Of note, patient had an echocardiogram in 2020 that revealed no evidence of congestive heart failure.  Per my update from nursing staff, patient continues to be tachypneic.  We will continue to monitor closely.  Sherryll Burger Ashaad Gaertner

## 2021-06-05 ENCOUNTER — Inpatient Hospital Stay (HOSPITAL_COMMUNITY): Payer: Medicare (Managed Care)

## 2021-06-05 ENCOUNTER — Encounter (HOSPITAL_COMMUNITY): Payer: Medicare (Managed Care)

## 2021-06-05 DIAGNOSIS — B957 Other staphylococcus as the cause of diseases classified elsewhere: Secondary | ICD-10-CM

## 2021-06-05 DIAGNOSIS — G9340 Encephalopathy, unspecified: Secondary | ICD-10-CM | POA: Diagnosis not present

## 2021-06-05 DIAGNOSIS — J9601 Acute respiratory failure with hypoxia: Secondary | ICD-10-CM

## 2021-06-05 DIAGNOSIS — J9600 Acute respiratory failure, unspecified whether with hypoxia or hypercapnia: Secondary | ICD-10-CM | POA: Diagnosis not present

## 2021-06-05 DIAGNOSIS — Z515 Encounter for palliative care: Secondary | ICD-10-CM | POA: Diagnosis not present

## 2021-06-05 DIAGNOSIS — N184 Chronic kidney disease, stage 4 (severe): Secondary | ICD-10-CM | POA: Diagnosis not present

## 2021-06-05 DIAGNOSIS — F3162 Bipolar disorder, current episode mixed, moderate: Secondary | ICD-10-CM

## 2021-06-05 DIAGNOSIS — L89302 Pressure ulcer of unspecified buttock, stage 2: Secondary | ICD-10-CM

## 2021-06-05 DIAGNOSIS — R7881 Bacteremia: Secondary | ICD-10-CM

## 2021-06-05 DIAGNOSIS — G9341 Metabolic encephalopathy: Secondary | ICD-10-CM | POA: Diagnosis not present

## 2021-06-05 DIAGNOSIS — S062XAA Diffuse traumatic brain injury with loss of consciousness status unknown, initial encounter: Secondary | ICD-10-CM | POA: Diagnosis not present

## 2021-06-05 LAB — CBC WITH DIFFERENTIAL/PLATELET
Abs Immature Granulocytes: 0.39 10*3/uL — ABNORMAL HIGH (ref 0.00–0.07)
Basophils Absolute: 0.1 10*3/uL (ref 0.0–0.1)
Basophils Relative: 1 %
Eosinophils Absolute: 1 10*3/uL — ABNORMAL HIGH (ref 0.0–0.5)
Eosinophils Relative: 7 %
HCT: 27 % — ABNORMAL LOW (ref 36.0–46.0)
Hemoglobin: 8.4 g/dL — ABNORMAL LOW (ref 12.0–15.0)
Immature Granulocytes: 3 %
Lymphocytes Relative: 24 %
Lymphs Abs: 3.2 10*3/uL (ref 0.7–4.0)
MCH: 28.3 pg (ref 26.0–34.0)
MCHC: 31.1 g/dL (ref 30.0–36.0)
MCV: 90.9 fL (ref 80.0–100.0)
Monocytes Absolute: 1.3 10*3/uL — ABNORMAL HIGH (ref 0.1–1.0)
Monocytes Relative: 10 %
Neutro Abs: 7.5 10*3/uL (ref 1.7–7.7)
Neutrophils Relative %: 55 %
Platelets: 449 10*3/uL — ABNORMAL HIGH (ref 150–400)
RBC: 2.97 MIL/uL — ABNORMAL LOW (ref 3.87–5.11)
RDW: 12.3 % (ref 11.5–15.5)
WBC: 13.5 10*3/uL — ABNORMAL HIGH (ref 4.0–10.5)
nRBC: 0.2 % (ref 0.0–0.2)

## 2021-06-05 LAB — BASIC METABOLIC PANEL
Anion gap: 12 (ref 5–15)
BUN: 105 mg/dL — ABNORMAL HIGH (ref 8–23)
CO2: 22 mmol/L (ref 22–32)
Calcium: 8.8 mg/dL — ABNORMAL LOW (ref 8.9–10.3)
Chloride: 103 mmol/L (ref 98–111)
Creatinine, Ser: 1.71 mg/dL — ABNORMAL HIGH (ref 0.44–1.00)
GFR, Estimated: 32 mL/min — ABNORMAL LOW (ref >60)
Glucose, Bld: 186 mg/dL — ABNORMAL HIGH (ref 70–99)
Potassium: 4.6 mmol/L (ref 3.5–5.1)
Sodium: 137 mmol/L (ref 135–145)

## 2021-06-05 LAB — BLOOD GAS, ARTERIAL
Acid-base deficit: 1.9 mmol/L (ref 0.0–2.0)
Bicarbonate: 22.6 mmol/L (ref 20.0–28.0)
FIO2: 28
O2 Saturation: 95.8 %
Patient temperature: 37.7
pCO2 arterial: 41.1 mmHg (ref 32.0–48.0)
pH, Arterial: 7.363 (ref 7.350–7.450)
pO2, Arterial: 88.4 mmHg (ref 83.0–108.0)

## 2021-06-05 LAB — GLUCOSE, CAPILLARY
Glucose-Capillary: 115 mg/dL — ABNORMAL HIGH (ref 70–99)
Glucose-Capillary: 150 mg/dL — ABNORMAL HIGH (ref 70–99)
Glucose-Capillary: 163 mg/dL — ABNORMAL HIGH (ref 70–99)
Glucose-Capillary: 163 mg/dL — ABNORMAL HIGH (ref 70–99)
Glucose-Capillary: 167 mg/dL — ABNORMAL HIGH (ref 70–99)
Glucose-Capillary: 171 mg/dL — ABNORMAL HIGH (ref 70–99)
Glucose-Capillary: 177 mg/dL — ABNORMAL HIGH (ref 70–99)
Glucose-Capillary: 191 mg/dL — ABNORMAL HIGH (ref 70–99)
Glucose-Capillary: 193 mg/dL — ABNORMAL HIGH (ref 70–99)

## 2021-06-05 LAB — LACTIC ACID, PLASMA
Lactic Acid, Venous: 1.6 mmol/L (ref 0.5–1.9)
Lactic Acid, Venous: 1.4 mmol/L (ref 0.5–1.9)

## 2021-06-05 LAB — PROCALCITONIN: Procalcitonin: 0.2 ng/mL

## 2021-06-05 IMAGING — DX DG CHEST 1V PORT
1 series · 1 of 1 positions shown · non-contrast
Comparison: [DATE].

CLINICAL DATA: Pneumonia.

EXAM:
PORTABLE CHEST 1 VIEW

[chest]
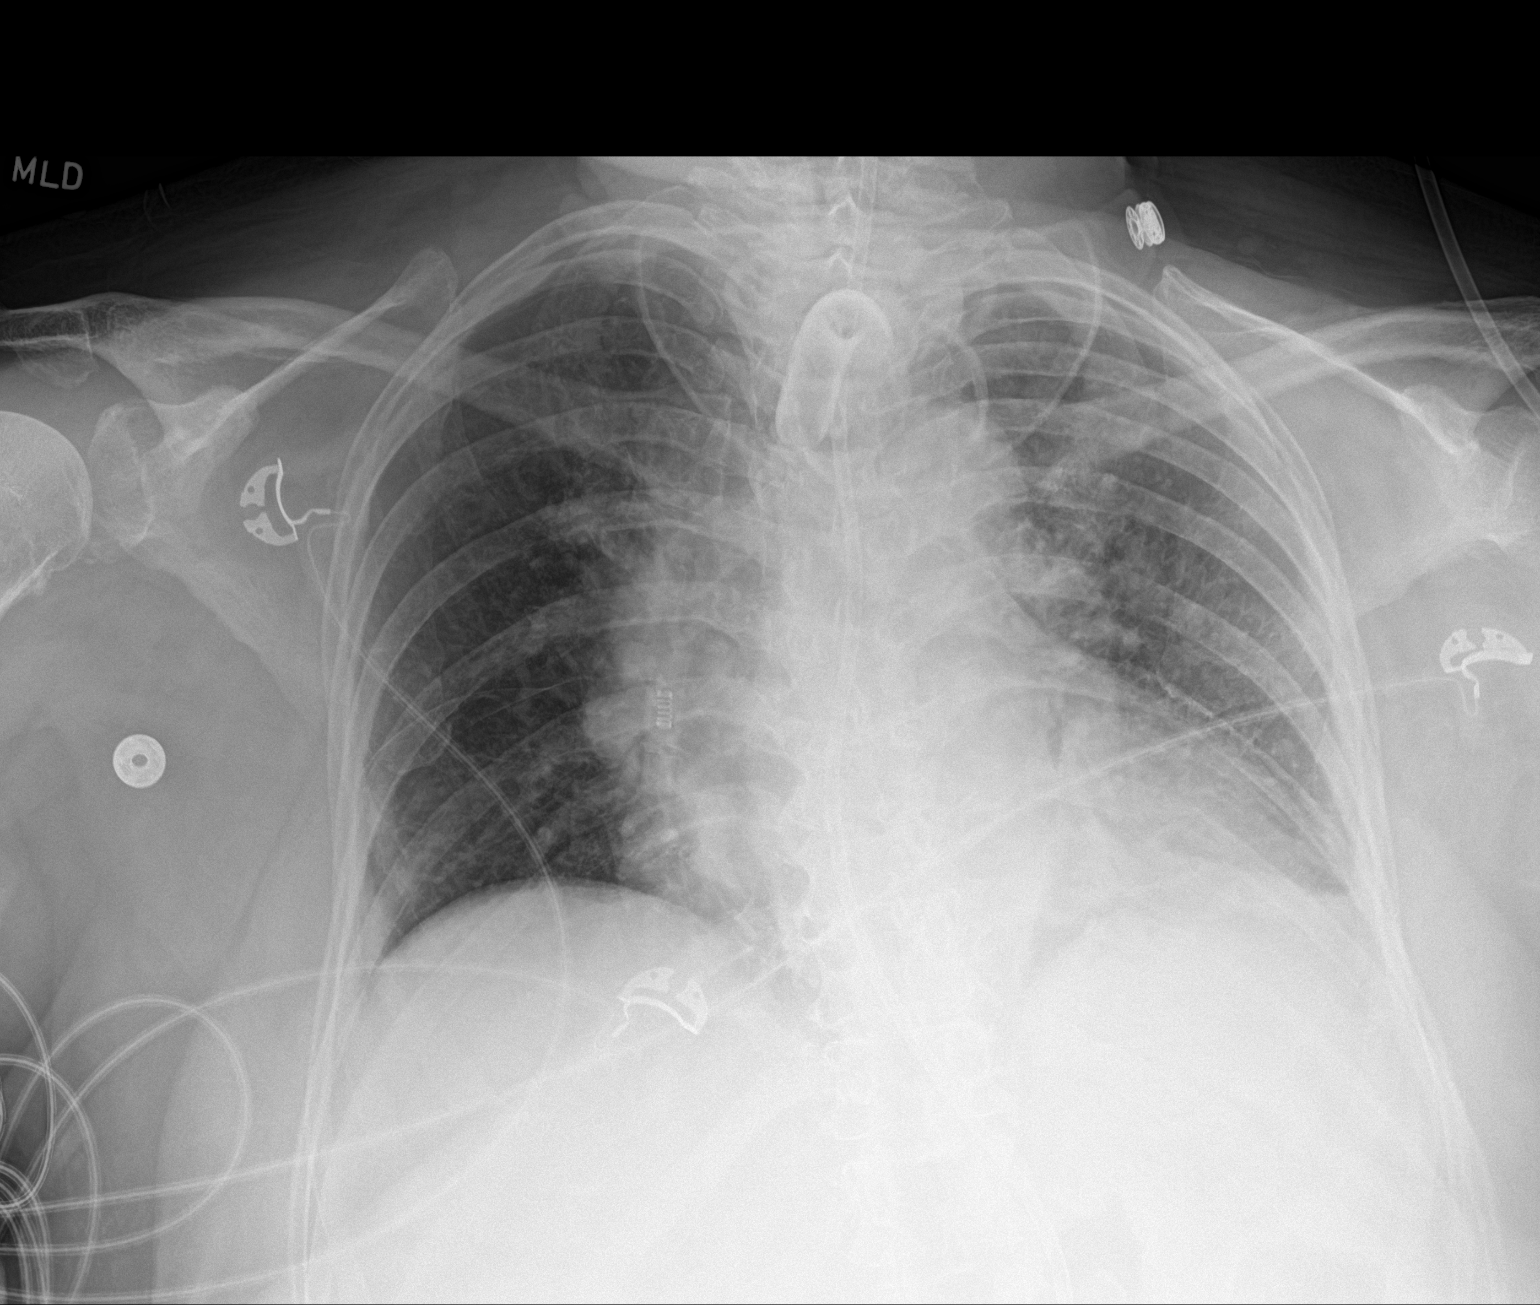

[1 of 1 positions shown; findings below may reference images not displayed]

FINDINGS: Stable cardiomediastinal silhouette. Tracheostomy and feeding tubes
are unchanged in position. Right lung is clear. Stable left basilar
atelectasis or infiltrate is noted. Bony thorax is unremarkable.
IMPRESSION: Stable left basilar atelectasis or infiltrate. Stable support
apparatus.

## 2021-06-05 MED ORDER — FENTANYL CITRATE (PF) 100 MCG/2ML IJ SOLN
INTRAMUSCULAR | Status: AC
  Administered 2021-06-05: 50 ug via INTRAVENOUS
  Filled 2021-06-05: qty 2

## 2021-06-05 MED ORDER — MIDAZOLAM HCL 2 MG/2ML IJ SOLN: 1.0000 mg | INTRAMUSCULAR | Status: DC | PRN

## 2021-06-05 MED ORDER — MIDAZOLAM HCL 2 MG/2ML IJ SOLN
INTRAMUSCULAR | Status: AC
  Administered 2021-06-05: 2 mg via INTRAVENOUS
  Filled 2021-06-05: qty 2

## 2021-06-05 MED ORDER — MIDAZOLAM HCL 2 MG/2ML IJ SOLN: 2.0000 mg | Freq: Once | INTRAMUSCULAR | Status: AC

## 2021-06-05 MED ORDER — FENTANYL CITRATE (PF) 100 MCG/2ML IJ SOLN: 25.0000 ug | Freq: Once | INTRAMUSCULAR | Status: DC

## 2021-06-05 MED ORDER — FENTANYL 2500MCG IN NS 250ML (10MCG/ML) PREMIX INFUSION
25.0000 ug/h | INTRAVENOUS | Status: DC
Start: 2021-06-05 — End: 2021-06-09
  Administered 2021-06-05 – 2021-06-06 (×2): 100 ug/h via INTRAVENOUS
  Filled 2021-06-05 (×2): qty 250

## 2021-06-05 MED ORDER — FENTANYL BOLUS VIA INFUSION
25.0000 ug | INTRAVENOUS | Status: DC | PRN
  Administered 2021-06-05: 25 ug via INTRAVENOUS
  Administered 2021-06-05 – 2021-06-06 (×4): 50 ug via INTRAVENOUS
  Filled 2021-06-05: qty 100

## 2021-06-05 MED ORDER — FENTANYL CITRATE (PF) 100 MCG/2ML IJ SOLN: 50.0000 ug | Freq: Once | INTRAMUSCULAR | Status: AC

## 2021-06-05 MED ORDER — ATROPINE SULFATE 1 MG/10ML IJ SOSY
PREFILLED_SYRINGE | INTRAMUSCULAR | Status: AC
  Filled 2021-06-05: qty 10

## 2021-06-05 NOTE — Progress Notes (Signed)
Speech Language Pathology Treatment: Nada Boozer Speaking valve  Patient Details Name: Kimberly Gross MRN: [id number] DOB: 1950-11-23 Today's Date: 06/05/2021 Time: 1537-9432 SLP Time Calculation (min) (ACUTE ONLY): 17 min  Assessment / Plan / Recommendation Clinical Impression  Pt is not interactive despite stimulation and multimodal cues, and today she is also tachypneic with RR hovering in the low 40s at baseline. WOB also appears to be increased compared to this SLP's last visit. As a result, SLP did not leave PMV in place, but did try donning it during exhalations to see if this could potentially aid in clearing any secretions. With valve donned she did have some spontaneous vocalizations on exhalations that did not sound audibly wet, but she also did not produce any verbalizations or functional output. Pt is still not ready to try any PO trials. Would only place PMV with SLP.   HPI HPI: Pt is a 70 y/o female who presented to the ED after being found obtunded in bed by family member 10/31. Pt found to be severely hypoglycemic which improved with glucagon, but no improvement in mental status. Pt intubated for GCS 3; ETT 10/31-trach11/16. EEG 76/1: metabolic encephalopathy rather than epileptiform activity. CT head & MRI brain unremarkable, CBC and CMP also unremarkable. IR consulted for PEG 11/17.  PMH CKD IV, DM, bipolar disorder, HTN, OSA on CPAP      SLP Plan  Continue with current plan of care      Recommendations for follow up therapy are one component of a multi-disciplinary discharge planning process, led by the attending physician.  Recommendations may be updated based on patient status, additional functional criteria and insurance authorization.    Recommendations         Patient may use Passy-Muir Speech Valve: with SLP only PMSV Supervision: Full MD: Please consider changing trach tube to : Cuffless;Smaller size         Oral Care Recommendations: Oral care QID Follow  Up Recommendations: Skilled nursing-short term rehab (<3 hours/day) Assistance recommended at discharge: Frequent or constant Supervision/Assistance SLP Visit Diagnosis: Aphonia (R49.1) Plan: Continue with current plan of care       GO                Osie Bond., M.A. [facility] [facility] Pager [phone] Office [phone]   06/05/2021, 11:39 AM

## 2021-06-05 NOTE — Progress Notes (Signed)
Palliative:  HPI: 70 yo female with past medical history of CKD stage IV, HTN, OSA on CPAP, diabetes, bipolar disorder admitted 05/13/21 with altered mental status and found to be hypoglycemic but no improvement after glucagon or Narcan. Required intubation. Unfortunately no improvement in mental status and concern for hypoglycemic brain injury. Trach placed 05/29/21. 06/03/21 conversation with daughter [name] - considering transition to comfort. 06/05/21 no decisions made and unable to reach [facility] for further discussion given worsening respiratory status.   I met today at Ms. Prevette's bedside. She is having very labored breathing which is new for her. At baseline this hospital stay she may open eyes on occasion (not typical during my visits) and nursing has noted on occasion a movement of leg or arm but non-purposeful and she does not follow any commands. Withdraws to pain at baseline. Last conversation with daughter, [name], she did NOT want to move forward with PEG placement and was talking to me about knowing that her mother would not want to live this way and considering transition to comfort (this was on 06/03/21). [name] did not want to make any firm decisions except to hold on PEG until she spoke with her children. I could not reach [facility] 06/04/21 (nor could Dr. Loleta Books although we both left voicemail). 06/05/21 now with worsening respiratory status and unable to reach family. [name]'s number reports that voicemail is full so I did send SMS to palliative contact number x 2 and to 3E unit number x 1. I have called her 3 x today. I also attempted to reach daughter Dorena Cookey but no answer and no option for voicemail (she has not been part of conversation this hospital stay - I have not met or spoken to her). I have encouraged RN to inform [name] of worsening status and encourage her to return my call if she hears from her. At this present time there have been no limitations set except to hold on PEG  placement. She continues a full code as no decisions have been made otherwise.   Discussed with Dr. Doristine Bosworth, bedside RN [place], and rapid response RN Kelli Churn.   Exam: Unresponsive. Breathing labored with accessory muscle use and tachypnea. Abd soft. Warm to touch.   Plan: - Cannot reach family to clarify goals of care. Will continue efforts to reach and discuss.   15 min  Vinie Sill, NP Palliative Medicine Team Pager [phone] (Please see [email or web address] for schedule) Team Phone [phone]    Greater than 50%  of this time was spent counseling and coordinating care related to the above assessment and plan

## 2021-06-05 NOTE — Progress Notes (Signed)
Nutrition Follow-up  DOCUMENTATION CODES:  Not applicable  INTERVENTION:  If within [place], continue Jevity 1.5 @ 50 ml/hr (1200 ml/day) - ProSource TF 45 ml TID - Free water flushes of 200 ml q 6 hours   Tube feeding regimen provides 1920 kcal, 110 grams of protein, and 912 ml of H2O.   Total free water with flushes: 1712 ml   Continue NutriSource Fiber TID per tube.  NUTRITION DIAGNOSIS:  Inadequate oral intake related to inability to eat as evidenced by NPO status. - ongoing  GOAL:  Patient will meet greater than or equal to 90% of their needs. - met with TF  MONITOR:  Vent status, Labs, Weight trends, TF tolerance, I & O's  REASON FOR ASSESSMENT:  Ventilator, Consult Enteral/tube feeding initiation and management  ASSESSMENT:  70 year old female who presented to the ED on 10/31 with AMS. PMH of CKD stage IV, T2DM, bipolar disorder, HTN, OSA on CPAP. Pt admitted with acute metabolic encephalopathy, acute respiratory failure with hypoxia. 10/31 - obtunded and hypoglycemic for EMS, mentation did not improve with glucagon; CT head unremarkable; Intubated and amitted to ICU 11/1 - LP normal 11/2 - seizures noted on EEG 11/3 - LTM EEG reviewed, correlating with metabolic encephalopathy rather than epileptiform activity 11/4-11/15 - no improvement in neurological function 11/16 - trach placed; Cortrak placed (tip gastric) 11/19 - transferred to [facility] 11/21 - in preparation for PEG placement and transfer to long term care, goals of care discussion with daughter, TRH and Palliative care; Daughter feels that patient would not have wanted to continue in current state, PEG on hold, daughter will discuss with family, may pursue Hospice 11/22 - fever again  RD working remotely.  GOC being discussed regarding plan for either discharge or discontinuation of care.  See above for nutrition interventions.  Admit wt: 111.7 kg Current wt: 116.7 kg  Medications: reviewed; Nutrifiber TID,  SSI, Protonix  Labs: reviewed; CBG 168-182 (H)  Diet Order:   Diet Order             Diet NPO time specified  Diet effective now                  EDUCATION NEEDS:  Not appropriate for education at this time  Skin:  Skin Assessment: Skin Integrity Issues: Skin Integrity Issues:: Other (Comment), Stage II Stage II: L buttocks Other: Skin tears on back x2  Last BM:  06/05/21 - Type 7, medium  Height:  Ht Readings from Last 1 Encounters:  06/01/21 _0  (1.6 m)   Weight:  Wt Readings from Last 1 Encounters:  06/05/21 116.7 kg   BMI:  Body mass index is 45.57 kg/m.  Estimated Nutritional Needs:  Kcal:  1700-1900 Protein:  110-130 grams Fluid:  1.7-1.9 L  Derrel Nip, RD, LDN (she/her/hers) Clinical Inpatient Dietitian RD Pager/After-Hours/Weekend Pager # in [place]

## 2021-06-05 NOTE — Progress Notes (Signed)
Progress Note    ISSABELA LESKO   IWP:[id number]  DOB: 1950-12-28  DOA: 05/13/2021     23 Date of Service: 06/05/2021      Brief summary: Mrs. Balan is a 70 y.o. F with DM, CKD IV, HTN, obesity found unresponsive by a family member.  Was severely hypogylcemic, but after improvement in blood sugar, remained unresponsive, required intubation for airway protection.   10/31 Obtunded and hypoglycemic for EMS, mentation did not improve with glucagon. CT head unremarkable.  Intubated and amitted to ICU; started on Unasyn for aspiration pneumonia 11/1 LP normal 11/2 Seizures noted on EEG, started Keppra 11/3 LTM EEG reviewed, correlating with metabolic encephalopathy rather than epileptiform activity 11/4-11/15 No improvement in neurological function 11/16 Trach placed 11/19 Transferred to [facility] 11/21 In preparation for PEG placement and transfer to long term care, goals of care discussion with daughter, TRH and Palliative care; Daughter feels that patient would not have wanted to continue in current state, PEG on hold, daughter will discuss with family, may pursue Hospice 11/22 Fever again --> Blood cultures with MRSE; started vancomycin, ID consulted 11/23.  Seen at bedside.  Patient significantly in respiratory distress with tachypnea.  Repeat chest x-ray shows improvement but clinically patient was worsening.  PCCM was called at bedside and decision was made to move the patient to ICU for full ventilatory support.      Assessment and Plan No new Assessment & Plan notes have been filed under this hospital service since the last note was generated. Service: Hospitalist  Diffuse injury to cerebrum From hypoglycemia.  To date, no noticeable neurologic recovery.   Persistent vegetative state ([place]) Due to hypoglycemia related diffuse cerebral injury   During goals of care discussion 11/21 with Dr. Loleta Books and palliative care, daughter expressed that her mother would not have wanted to  live long-term in a nursing home, unable to communicate, walk, interact with family, and dependent upon a feeding tube and tracheostomy.  IR was consulted for PEG tube placement but daughter had requested to hold that plan for now.  She wanted to discuss with other family members and her kids to make a final decision.  Unfortunately, over the course of last 12 to 14 hours, patient has went into significant respiratory distress.  I asked palliative care to try to reach out to the daughter [name] and I myself tried to reach her as well but she was unreachable.  Eventually PCCM were called at the bedside, due to respiratory distress, patient is going to be transferred to ICU for need of full ventilatory support and ICU team will assume care as primary team.  Hospitalist is signing off.  Continue rest of the medications per ICU team.  Staphylococcus epidermidis bacteremia Persistent fever last 2-3 days, with last temperature of 100.5 at midnight of 06/04/2021.  CXR and UA clear.  Blood cultures obtained growing MRSE in both sets.  All PIVs look okay, not likely the source.  She has a stage I pressure ulcer which may really just be some MASD not a pressure ulcer, which may be the source?  Versus her trach?  ID is on board and managing antibiotics.  She is on vancomycin.   Pressure injury of skin Stage 2 left buttocks, not POA - Local wound care   Anemia due to stage 4 chronic kidney disease (HCC) Hgb stable.  Creatinine at baseline.   Bipolar 1 disorder, mixed, moderate (HCC)   Essential hypertension BP normal -Continue Coreg, HCTZ, hydralazine  Morbid (severe) obesity due to excess calories (HCC) BMI 42 -Continue PPI   Type 2 diabetes mellitus without complication, without long-term current use of insulin (HCC) Glucose normal -Continue glargine -Continue SS corrections   -Continue aspirin and Lipitor  Subjective:   Patient seen and examined earlier.  No family member at the bedside.   Patient was in significant respiratory distress with tachypnea.  Chest x-ray repeated.  Objective Vitals:   06/05/21 1129 06/05/21 1215 06/05/21 1256 06/05/21 1318  BP:  (!) 123/57  136/63  Pulse:  76  75  Resp:  (!) 22  (!) 28  Temp:   99.9 F (37.7 C)   TempSrc:   Rectal   SpO2: 96% 97%  100%  Weight:      Height:       116.7 kg  Vital signs were reviewed    Exam  General exam: Appears uncomfortable and in respiratory distress. Respiratory system: Diminished breath sounds with rhonchi and coarse breath sounds, tachypnea with accessory muscle usage. Cardiovascular system: S1 & S2 heard, tachycardia. No JVD, murmurs, rubs, gallops or clicks. No pedal edema. Gastrointestinal system: Abdomen is nondistended, soft and nontender. No organomegaly or masses felt. Normal bowel sounds heard. Central nervous system: Obtunded, unresponsive.   Labs / Other Information My review of labs, imaging, notes and other tests shows no new significant findings.   Other than bacteremia.  Disposition Plan: Status is: Inpatient  Remains inpatient appropriate because: Patient in significant respiratory distress needing full ventilatory support.  Family unable to make any decision and patient currently full code.    Time spent: 40 minutes  Darliss Cheney, MD [facility] 06/05/2021, 2:06 PM

## 2021-06-05 NOTE — Significant Event (Signed)
Rapid Response Event Note   Reason for Call :  Continued labored breathing and tachypnea  Initial Focused Assessment:  Patient is generally unresponsive.  She does have a gag reflex and will occasionally open eye to significant stimulation but does not track or move extremities.    She is labored, RR 35-42.  Very diminished lung sounds through out.   BP 123/57  SR 77   97% O2 sat on 28% TC  Rectal temp  99.9 CBG 193  She is warm and dry to the touch Liquid Maslowski stool  Palliative Care NP has attempted to contact daughter multiple times today  Interventions:  Trach suctioned, no secretions Oral care, she does have a gag PCXR ABG CBC, lactic  CCM also notified of change in patient status.  Whitney NP at bedside to assess patient  Patient started sounding congested,  trach suctioned again,  large amount thick tan secretions.  Transfer to ICU  Plan of Care:  ICU care Place on ventilator   Event Summary:   MD Notified: Pahwani Call Time: [place] Time: 1205 End Time: [place]  Raliegh Ip, RN

## 2021-06-05 NOTE — Consult Note (Signed)
Date of Admission:  05/13/2021          Reason for Consult:  Staph epidermidis bacteremia apparently nosocomial   Referring Provider: Myrene Buddy, MD Darliss Cheney, MD  Assessment:  Methicillin-resistant Staphylococcus epidermidis bacteremia--seems to be hospital acquired likely from PIV Stage II decubitus ulcer Diffuse cerebral injury from hypoglycemia Seizures Chronic kidney disease Diabetes mellitus Bipolar disorder Obesity  Plan:  Discontinue existing peripheral IV Duplex ultrasound to ensure no significant phlebitis at the site Place peripheral IV and opposite arm Agree with vancomycin, treatment likely 10 days but possibly longer if there is a  phlebitis Agree with ongoing involvement with palliative care.  Principal Problem:   Diffuse injury to cerebrum Active Problems:   Bipolar 1 disorder, mixed, moderate (HCC)   Morbid (severe) obesity due to excess calories (HCC)   Type 2 diabetes mellitus without complication, without long-term current use of insulin (HCC)   Essential hypertension   CKD (chronic kidney disease) stage 4, GFR 15-29 ml/min (HCC)   Acute hypercapnic respiratory failure (HCC)   Pressure injury of skin   Persistent vegetative state ([place])   Anemia due to stage 4 chronic kidney disease (HCC)   Staphylococcus epidermidis bacteremia   Scheduled Meds:  amLODipine  10 mg Per Tube Daily   aspirin  81 mg Per Tube Daily   atorvastatin  40 mg Per Tube QHS   carvedilol  25 mg Per Tube BID WC   chlorhexidine gluconate (MEDLINE KIT)  15 mL Mouth Rinse BID   Chlorhexidine Gluconate Cloth  6 each Topical Daily   enoxaparin (LOVENOX) injection  60 mg Subcutaneous Q24H   feeding supplement (PROSource TF)  45 mL Per Tube TID   fiber  2 packet Per Tube TID   free water  200 mL Per Tube Q6H   Gerhardt's butt cream   Topical BID   hydrALAZINE  50 mg Per Tube Q8H   hydrochlorothiazide  25 mg Per Tube Daily   insulin aspart  0-9 Units Subcutaneous  Q4H   insulin glargine-yfgn  8 Units Subcutaneous Daily   levETIRAcetam  500 mg Per Tube BID   mouth rinse  15 mL Mouth Rinse 10 times per day   pantoprazole sodium  40 mg Per Tube QHS   Continuous Infusions:  sodium chloride 10 mL/hr at 06/01/21 0800   feeding supplement (JEVITY 1.5 CAL/FIBER) 1,000 mL (06/05/21 0512)   [START ON 06/06/2021] vancomycin     PRN Meds:.sodium chloride, acetaminophen, docusate, ipratropium-albuterol, labetalol, loperamide HCl, ondansetron (ZOFRAN) IV, polyethylene glycol, white petrolatum  HPI: Kimberly Gross is a 70 y.o. female with past medical history significant for diabetes mellitus hypertension chronic kidney disease and obesity was found unresponsive by family member patient was severely hypoglycemic and had sugar repleted but remained obtunded and ultimately required intubation.  Patient was admitted to the ICU and given a course of Unasyn for possible aspiration pneumonia she went underwent lumbar puncture which was unrevealing.  EEG H noted seizures and she was initiated on Keppra she has continued to be obtunded with no significant provement neurological function.  She underwent tracheostomy.  PEG tube was being contemplated.  Palliative care is also seeing the patient.  Patient then began having fevers and blood cultures were taken which now have grown methicillin-resistant Staphylococcus epidermidis from both sites sampled.  The patient has a peripheral IV that has been in place for 21 days.  I would suspect this is the likely source of her bacteremia.  Certainly given the timing of her fevers and bacteremia this is a nosocomial bacteremia.  Other potential sources could be her decubitus ulcer.  I have asked the nurse to discontinue her current peripheral IV and to place a different 1.  I am obtaining a duplex of the right upper extremity to assess for phlebitis.  Will consider a 10 to 14-day course of treatment.  I spent 61minutes with the patient  including than 50% of the time in face to face counseling of the patient  personally reviewing chest x-ray CT abdomen pelvis CT head lumbar puncture results chest x-ray CBC BMP updated blood cultures urine cultures along with review of medical records in preparation for the visit and during the visit and in coordination of her care.    Review of Systems: Review of Systems  Unable to perform ROS: Patient unresponsive   Past Medical History:  Diagnosis Date   Anemia    Anxiety    Bipolar 1 disorder ([place])    CKD (chronic kidney disease), stage IV (HCC)    Depression    Diabetes mellitus without complication (HCC)    Hyperlipemia    Hypertension    Occasional tremors     Social History   Tobacco Use   Smoking status: Never   Smokeless tobacco: Never  Vaping Use   Vaping Use: Never used  Substance Use Topics   Alcohol use: No   Drug use: No    Family History  Problem Relation Age of Onset   Hypertension Mother    Heart attack Father    Breast cancer Neg Hx    Allergies  Allergen Reactions   Chlorpromazine Rash    OBJECTIVE: Blood pressure 132/60, pulse 75, temperature 99.9 F (37.7 C), temperature source Rectal, resp. rate (!) 28, height $RemoveBe'5\' 3"'kuXzawJwS$  (1.6 m), weight 116.7 kg, SpO2 96 %.  Physical Exam Constitutional:      General: She is not in acute distress.    Appearance: She is well-developed. She is obese. She is not ill-appearing or diaphoretic.  HENT:     Head: Normocephalic and atraumatic.     Right Ear: Hearing and external ear normal.     Left Ear: Hearing and external ear normal.     Nose: No nasal deformity or rhinorrhea.  Eyes:     General: No scleral icterus.    Extraocular Movements: Extraocular movements intact.     Conjunctiva/sclera: Conjunctivae normal.     Right eye: Right conjunctiva is not injected.     Left eye: Left conjunctiva is not injected.     Pupils: Pupils are equal, round, and reactive to light.  Neck:     Vascular: No JVD.   Cardiovascular:     Rate and Rhythm: Normal rate and regular rhythm.     Heart sounds: Normal heart sounds, S1 normal and S2 normal. No murmur heard.   No friction rub.  Pulmonary:     Effort: Pulmonary effort is normal. No respiratory distress.     Breath sounds: No stridor. No wheezing, rhonchi or rales.  Abdominal:     General: Bowel sounds are normal. There is no distension.     Palpations: Abdomen is soft.     Tenderness: There is no abdominal tenderness.  Musculoskeletal:        General: Normal range of motion.     Right shoulder: Normal.     Left shoulder: Normal.     Cervical back: Normal range of motion and neck supple.  Right hip: Normal.     Left hip: Normal.     Right knee: Normal.     Left knee: Normal.  Lymphadenopathy:     Head:     Right side of head: No submandibular, preauricular or posterior auricular adenopathy.     Left side of head: No submandibular, preauricular or posterior auricular adenopathy.     Cervical: No cervical adenopathy.     Right cervical: No superficial or deep cervical adenopathy.    Left cervical: No superficial or deep cervical adenopathy.  Skin:    General: Skin is warm and dry.     Coloration: Skin is not pale.     Findings: No abrasion, bruising, ecchymosis, erythema, lesion or rash.     Nails: There is no clubbing.  Psychiatric:        Attention and Perception: She is attentive.        Speech: Speech normal.        Behavior: Behavior is cooperative.   Obtunded  Peripheral IV in right wrist without any overt purulence  Lab Results Lab Results  Component Value Date   WBC 12.1 (H) 06/04/2021   HGB 8.1 (L) 06/04/2021   HCT 26.1 (L) 06/04/2021   MCV 90.6 06/04/2021   PLT 392 06/04/2021    Lab Results  Component Value Date   CREATININE 1.71 (H) 06/05/2021   BUN 105 (H) 06/05/2021   NA 137 06/05/2021   K 4.6 06/05/2021   CL 103 06/05/2021   CO2 22 06/05/2021    Lab Results  Component Value Date   ALT 42 05/17/2021    AST 74 (H) 05/17/2021   ALKPHOS 48 05/17/2021   BILITOT 0.5 05/17/2021     Microbiology: Recent Results (from the past 240 hour(s))  Urine Culture     Status: None   Collection Time: 06/02/21  4:40 PM   Specimen: In/Out Cath Urine  Result Value Ref Range Status   Specimen Description IN/OUT CATH URINE  Final   Special Requests NONE  Final   Culture   Final    NO GROWTH Performed at [facility], [address] [place], [place] [address]    Report Status 06/04/2021 FINAL  Final  Culture, blood (routine x 2)     Status: Abnormal (Preliminary result)   Collection Time: 06/03/21  6:02 PM   Specimen: Site Not Specified; Blood  Result Value Ref Range Status   Specimen Description SITE NOT SPECIFIED  Final   Special Requests   Final    BOTTLES DRAWN AEROBIC AND ANAEROBIC Blood Culture adequate volume   Culture  Setup Time   Final    GRAM POSITIVE COCCI IN CLUSTERS IN BOTH AEROBIC AND ANAEROBIC BOTTLES Organism ID to follow CRITICAL RESULT CALLED TO, READ BACK BY AND VERIFIED WITHReece Packer PHARMD [id number] 06/04/21 A BROWNING Performed at [facility], [place] [address] [place], [place] [address]    Culture STAPHYLOCOCCUS EPIDERMIDIS (A)  Final   Report Status PENDING  Incomplete  Blood Culture ID Panel (Reflexed)     Status: Abnormal   Collection Time: 06/03/21  6:02 PM  Result Value Ref Range Status   Enterococcus faecalis NOT DETECTED NOT DETECTED Final   Enterococcus Faecium NOT DETECTED NOT DETECTED Final   Listeria monocytogenes NOT DETECTED NOT DETECTED Final   Staphylococcus species DETECTED (A) NOT DETECTED Final    Comment: CRITICAL RESULT CALLED TO, READ BACK BY AND VERIFIED WITH: A MEYER PHARMD 1529 06/04/21 A  BROWNING    Staphylococcus aureus (BCID) NOT DETECTED NOT DETECTED Final   Staphylococcus epidermidis DETECTED (A) NOT DETECTED Final    Comment: Methicillin (oxacillin) resistant coagulase negative staphylococcus. Possible blood culture contaminant  (unless isolated from more than one blood culture draw or clinical case suggests pathogenicity). No antibiotic treatment is indicated for blood  culture contaminants. CRITICAL RESULT CALLED TO, READ BACK BY AND VERIFIED WITH: A MEYER PHARMD 1529 06/04/21 A BROWNING    Staphylococcus lugdunensis NOT DETECTED NOT DETECTED Final   Streptococcus species NOT DETECTED NOT DETECTED Final   Streptococcus agalactiae NOT DETECTED NOT DETECTED Final   Streptococcus pneumoniae NOT DETECTED NOT DETECTED Final   Streptococcus pyogenes NOT DETECTED NOT DETECTED Final   A.calcoaceticus-baumannii NOT DETECTED NOT DETECTED Final   Bacteroides fragilis NOT DETECTED NOT DETECTED Final   Enterobacterales NOT DETECTED NOT DETECTED Final   Enterobacter cloacae complex NOT DETECTED NOT DETECTED Final   Escherichia coli NOT DETECTED NOT DETECTED Final   Klebsiella aerogenes NOT DETECTED NOT DETECTED Final   Klebsiella oxytoca NOT DETECTED NOT DETECTED Final   Klebsiella pneumoniae NOT DETECTED NOT DETECTED Final   Proteus species NOT DETECTED NOT DETECTED Final   Salmonella species NOT DETECTED NOT DETECTED Final   Serratia marcescens NOT DETECTED NOT DETECTED Final   Haemophilus influenzae NOT DETECTED NOT DETECTED Final   Neisseria meningitidis NOT DETECTED NOT DETECTED Final   Pseudomonas aeruginosa NOT DETECTED NOT DETECTED Final   Stenotrophomonas maltophilia NOT DETECTED NOT DETECTED Final   Candida albicans NOT DETECTED NOT DETECTED Final   Candida auris NOT DETECTED NOT DETECTED Final   Candida glabrata NOT DETECTED NOT DETECTED Final   Candida krusei NOT DETECTED NOT DETECTED Final   Candida parapsilosis NOT DETECTED NOT DETECTED Final   Candida tropicalis NOT DETECTED NOT DETECTED Final   Cryptococcus neoformans/gattii NOT DETECTED NOT DETECTED Final   Methicillin resistance mecA/C DETECTED (A) NOT DETECTED Final    Comment: CRITICAL RESULT CALLED TO, READ BACK BY AND VERIFIED WITHReece Packer PHARMD  1529 06/04/21 A BROWNING Performed at [facility], [address] [place], [place] [address]   Culture, blood (routine x 2)     Status: Abnormal (Preliminary result)   Collection Time: 06/03/21  6:04 PM   Specimen: Site Not Specified; Blood  Result Value Ref Range Status   Specimen Description SITE NOT SPECIFIED  Final   Special Requests   Final    BOTTLES DRAWN AEROBIC AND ANAEROBIC Blood Culture adequate volume   Culture  Setup Time   Final    GRAM POSITIVE COCCI IN CLUSTERS IN BOTH AEROBIC AND ANAEROBIC BOTTLES CRITICAL RESULT CALLED TO, READ BACK BY AND VERIFIED WITH: Reece Packer PHARMD 1529 06/04/21 A BROWNING Performed at [facility], [place] [address], [place], [place] [address]    Culture STAPHYLOCOCCUS EPIDERMIDIS (A)  Final   Report Status PENDING  Incomplete    Alcide Evener, MD [facility] [place] [facility] [phone] pager  06/05/2021, 1:22 PM

## 2021-06-05 NOTE — Progress Notes (Addendum)
 NAME:  Kimberly Gross, MRN:  [id number], DOB:  09/10/1950, LOS: 23 ADMISSION DATE:  05/13/2021, CONSULTATION DATE:  05/13/21 REFERRING MD:  Curatolo - EM, CHIEF COMPLAINT:  AMS    History of Present Illness:  70 yo F PMH CKD IV, DM, bipolar disorder, HTN, Osa on CPAP who was found obtunded in bed by family member 10/31 afternoon found to be severely hypoglycemic which improved with glucagon but no improvement in mental status. Intubated for GCS 3. CT head unremarkable, CBC and CMP also unremarkable.  PCCM called to bedside 11/23 due to increased work of breathing.  Patient is significantly tachypneic with accessory muscle use.  Significant hypoxia and ABG reassuring at this time but patient's work of breathing is concerning for eventual respiratory continued.  Rectal patient will be transferred back to ICU for ventilator support and aggressive pulmonary toileting  Pertinent  Medical History  CKD IV Bipolar Disorder DM2  HLD Anxiety HTN OSA on CPAP  Significant Hospital Events: Including procedures, antibiotic start and stop dates in addition to other pertinent events   10/31 Obtunded and hypoglycemic for EMS, mentation did not improve with glucagon. Intubated in ED, CT H non acute. Admitting to PCCM  11/1 LP performed by IR, CSF clear with no organisms on gram stain 11/2 routine EEG read as seizure activity, loaded with levetiracetam 11/3 LTM EEG reviewed, correlating with metabolic encephalopathy rather than epileptiform activity 11/5 no acute events overnight, remains on continuous EEG with no signs of seizures, did not wean on MV 11/7 completed course of ampicillin-sulbactam for aspiration pneumonia 11/16 Trach 11/19 patient transferred out of ICU to hospitalist service 11/23 patient developed worsened respiratory distress with severe tachypnea and severely diminished bilateral breath sounds concerning for mucous plug.  Patient be transferred back to ICU for ventilator  support  Interim History / Subjective:  Patient seen at bedside at the request of [responsiveness due to increased work of breathing and concern for ability to continue to protect airway Oxygen saturations currently appropriate on ATC  but work of breathing is concerning for eventual respiratory fatigue  Objective   Blood pressure 132/60, pulse 75, temperature 99.9 F (37.7 C), temperature source Rectal, resp. rate (!) 28, height 5' 3" (1.6 m), weight 116.7 kg, SpO2 96 %.    FiO2 (%):  [28 %] 28 %   Intake/Output Summary (Last 24 hours) at 06/05/2021 1324 Last data filed at 06/05/2021 0655 Gross per 24 hour  Intake --  Output 1050 ml  Net -1050 ml    Filed Weights   06/03/21 0530 06/04/21 0545 06/05/21 0400  Weight: 114.8 kg 114.4 kg 116.7 kg    Examination: General: Acute on chronic ill-appearing elderly female in bed and in moderate respiratory distress  HEENT: 8 cuffed Shiley trach midline, MM pink/moist, PERRL,  Neuro: Eyes spontaneously open but remains unresponsive CV: s1s2 regular rate and rhythm, no murmur, rubs, or gallops,  PULM: Tachypnea with moderate increased work of breathing, oxygen saturations appropriate on ATC but work of breathing is significant GI: soft, bowel sounds active in all 4 quadrants, non-tender, non-distended, tolerating TF Extremities: warm/dry, generalized nonpitting edema  Skin: no rashes or lesions  Resolved Hospital Problem list   Possible aspiration pneumonia Left hip external rotation - hip fracture ruled out Hyperkalemia Hyponatremia AKI on CKD4 Metabolic acidosis  Assessment & Plan:  Acute on chronic respiratory failure with hypoxia requiring mechanical ventilation-  -Due to poor secretion clearance ability in setting of profound CNS injury patient eventually required   tracheostomy 11/16 S/p tracheostomy 11/16 -Patient does have some post procedure bleeding P: New increased work of breathing with concern for mucous  plugging Transfer ICU for ventilator support Continue ventilator support with lung protective strategies  Wean PEEP and FiO2 for sats greater than 90%. Head of bed elevated 30 degrees. Plateau pressures less than 30 cm H20.  Follow intermittent chest x-ray and ABG.   SAT/SBT as tolerated, mentation preclude extubation  Ensure adequate pulmonary hygiene  Follow cultures  VAP bundle in place  PAD protocol Aggressive pulmonary hygiene Hypertonic nebs and chest  On antibiotics currently Routine trach care  Methicillin-resistant Staphylococcus epidermis bacteremia -Patient was seen with persistent fever x2 days with clear chest x-ray and lying.  Therefore blood cultures were obtained 11/20 and revealed MRSE positive in both cultures  -Felt possibly secondary to PIV P: ID following IV vancomycin x10 days Follow culture data Medical IV discontinued New access established  Diffuse cerebral injury felt secondary to hypoglycemia Persistent vegetative state -Patient continues to have no noticeable neurological recovery P: Maintain neuro protective measures; goal for eurothermia, euglycemia, eunatermia, normoxia, and PCO2 goal of 35-40 Nutrition and bowel regiment  Seizure precautions  Aspirations precautions   Chronic kidney disease -Baseline creatinine in the last several days has ranged between 1.6-1.7.  Creatinine in March of this year 2.26 P: Follow renal function  Monitor urine output Trend Bmet Avoid nephrotoxins Ensure adequate renal perfusion   Type 2 diabetes P: Continue SSI CBG checks every 4 hours CBG goal 1 40-1 80  Stage II decubitus ulcer,not POA  P: Pressure  alleviating devices Local wound care   Dysphagia -Plan for PEG cancelled as family considering transition to comfort care per 11/21 Palliative Care note -New goals of care discussion below for further details P: Continue core track with tube feeds currently Protein supplementation  Anemia due  to CKD P: Trend CBC Transfuse per protocol Hemoglobin goal greater than 7  Goals of care Per chart review patient's daughter met with Dr. Danford and met palliative care 11/29 and expressed desires to potentially move forward with comfort care measures.  At that time PEG tube placement was deferred for prolonged since admission conversation with clinical team has not been able to reestablish communication with patient's family.  Medical patient currently remains full code.  Clinical staff to continue to reach family regarding discussion   Best Practice (right click and "Reselect all SmartList Selections" daily)   Diet/type: tubefeeds and NPO DVT prophylaxis: prophylactic heparin  GI prophylaxis: PPI Lines: N/A Foley:  N/A Code Status:  full code Last date of multidisciplinary goals of care discussion [11/18] Dr. Chand spoke with patient's daughter and updated about her condition.  She has subsequently spoken with Palliative care  11/21 and is discussing transition to comfort care with family.   Critical care time:    Performed by:  D. Harris  Total critical care time: 40 minutes  Critical care time was exclusive of separately billable procedures and treating other patients.  Critical care was necessary to treat or prevent imminent or life-threatening deterioration.  Critical care was time spent personally by me on the following activities: development of treatment plan with patient and/or surrogate as well as nursing, discussions with consultants, evaluation of patient's response to treatment, examination of patient, obtaining history from patient or surrogate, ordering and performing treatments and interventions, ordering and review of laboratory studies, ordering and review of radiographic studies, pulse oximetry and re-evaluation of patient's condition.   D. Harris, NP-C   [facility] Personal contact information can be found on Amion   06/05/2021, 1:43 PM

## 2021-06-05 NOTE — Progress Notes (Signed)
Palliative:  Update:  I was finally able to reach [facility]. I explained to [facility] that I am very worried about her mother's condition today. I explained that she has worsening respiratory status and I am very concerned about her worsening condition. Kimberly Gross explains that she has not been able to make a decision and at this time wishes to continue with full scope treatment including transfer to ICU and ventilator support as needed. I also discussed with Kimberly Gross my concerns for her mother's prognosis is she were to continue to decline. I recommended that we not put her through CPR if her heart were to stop and it is her time. Kimberly Gross pauses to consider but is not able to make this limitation. Kimberly Gross reports that she would want to be called is CPR is required and I did explain that this intervention is provided in an emergent situation and there usually is not time to call to discuss which is why it is important to consider this now. Kimberly Gross has expressed that she does not feel her mother would want to live this way but struggling with making any decisions to allow her mother a natural and peaceful death. No changes in code status or scope of care at this time.   Vinie Sill, NP Palliative Medicine Team Pager [phone] (Please see [email or web address] for schedule) Team Phone [phone]

## 2021-06-06 DIAGNOSIS — S062XAA Diffuse traumatic brain injury with loss of consciousness status unknown, initial encounter: Secondary | ICD-10-CM | POA: Diagnosis not present

## 2021-06-06 LAB — CBC WITH DIFFERENTIAL/PLATELET
Abs Immature Granulocytes: 0.36 10*3/uL — ABNORMAL HIGH (ref 0.00–0.07)
Basophils Absolute: 0 10*3/uL (ref 0.0–0.1)
Basophils Relative: 0 %
Eosinophils Absolute: 0.9 10*3/uL — ABNORMAL HIGH (ref 0.0–0.5)
Eosinophils Relative: 8 %
HCT: 26.2 % — ABNORMAL LOW (ref 36.0–46.0)
Hemoglobin: 8 g/dL — ABNORMAL LOW (ref 12.0–15.0)
Immature Granulocytes: 3 %
Lymphocytes Relative: 25 %
Lymphs Abs: 2.9 10*3/uL (ref 0.7–4.0)
MCH: 28.2 pg (ref 26.0–34.0)
MCHC: 30.5 g/dL (ref 30.0–36.0)
MCV: 92.3 fL (ref 80.0–100.0)
Monocytes Absolute: 1.2 10*3/uL — ABNORMAL HIGH (ref 0.1–1.0)
Monocytes Relative: 10 %
Neutro Abs: 6.3 10*3/uL (ref 1.7–7.7)
Neutrophils Relative %: 54 %
Platelets: 401 10*3/uL — ABNORMAL HIGH (ref 150–400)
RBC: 2.84 MIL/uL — ABNORMAL LOW (ref 3.87–5.11)
RDW: 12.3 % (ref 11.5–15.5)
WBC: 11.7 10*3/uL — ABNORMAL HIGH (ref 4.0–10.5)
nRBC: 0.6 % — ABNORMAL HIGH (ref 0.0–0.2)

## 2021-06-06 LAB — CULTURE, BLOOD (ROUTINE X 2)
Special Requests: ADEQUATE
Special Requests: ADEQUATE

## 2021-06-06 LAB — GLUCOSE, CAPILLARY
Glucose-Capillary: 160 mg/dL — ABNORMAL HIGH (ref 70–99)
Glucose-Capillary: 172 mg/dL — ABNORMAL HIGH (ref 70–99)
Glucose-Capillary: 180 mg/dL — ABNORMAL HIGH (ref 70–99)
Glucose-Capillary: 149 mg/dL — ABNORMAL HIGH (ref 70–99)
Glucose-Capillary: 160 mg/dL — ABNORMAL HIGH (ref 70–99)
Glucose-Capillary: 153 mg/dL — ABNORMAL HIGH (ref 70–99)

## 2021-06-06 LAB — BASIC METABOLIC PANEL
Anion gap: 11 (ref 5–15)
BUN: 113 mg/dL — ABNORMAL HIGH (ref 8–23)
CO2: 22 mmol/L (ref 22–32)
Calcium: 8.9 mg/dL (ref 8.9–10.3)
Chloride: 104 mmol/L (ref 98–111)
Creatinine, Ser: 1.61 mg/dL — ABNORMAL HIGH (ref 0.44–1.00)
GFR, Estimated: 34 mL/min — ABNORMAL LOW (ref >60)
Glucose, Bld: 184 mg/dL — ABNORMAL HIGH (ref 70–99)
Potassium: 4.6 mmol/L (ref 3.5–5.1)
Sodium: 137 mmol/L (ref 135–145)

## 2021-06-06 LAB — TROPONIN I (HIGH SENSITIVITY)
Troponin I (High Sensitivity): 30 ng/L — ABNORMAL HIGH (ref <18)
Troponin I (High Sensitivity): 29 ng/L — ABNORMAL HIGH (ref <18)

## 2021-06-06 LAB — MAGNESIUM: Magnesium: 2.7 mg/dL — ABNORMAL HIGH (ref 1.7–2.4)

## 2021-06-06 MED ORDER — CHLORHEXIDINE GLUCONATE 0.12 % MT SOLN
OROMUCOSAL | Status: AC
  Filled 2021-06-06: qty 15

## 2021-06-06 NOTE — Progress Notes (Signed)
NAME:  Kimberly Gross, MRN:  [id number], DOB:  [birth year], LOS: 24 ADMISSION DATE:  05/13/2021, CONSULTATION DATE:  05/13/21 REFERRING MD:  Ronnald Nian - EM, CHIEF COMPLAINT:  AMS    History of Present Illness:  70 yo F PMH CKD IV, DM, bipolar disorder, HTN, Osa on CPAP who was found obtunded in bed by family member 10/31 afternoon found to be severely hypoglycemic which improved with glucagon but no improvement in mental status. Intubated for GCS 3. CT head unremarkable, CBC and CMP also unremarkable.  PCCM called to bedside 11/23 due to increased work of breathing.  Patient is significantly tachypneic with accessory muscle use.  Significant hypoxia and ABG reassuring at this time but patient's work of breathing is concerning for eventual respiratory continued.  Rectal patient will be transferred back to ICU for ventilator support and aggressive pulmonary toileting  Pertinent  Medical History   Past Medical History:  Diagnosis Date   Anemia    Anxiety    Bipolar 1 disorder ([place])    CKD (chronic kidney disease), stage IV (HCC)    Depression    Diabetes mellitus without complication ([place])    Hyperlipemia    Hypertension    Occasional tremors   OSA on BiPAP  Significant Hospital Events: Including procedures, antibiotic start and stop dates in addition to other pertinent events   10/31 Obtunded and hypoglycemic for EMS, mentation did not improve with glucagon. Intubated in ED, CT H non acute. Admitting to PCCM  11/1 LP performed by IR, CSF clear with no organisms on gram stain 11/2 routine EEG read as seizure activity, loaded with levetiracetam 11/3 LTM EEG reviewed, correlating with metabolic encephalopathy rather than epileptiform activity 11/5 no acute events overnight, remains on continuous EEG with no signs of seizures, did not wean on MV 11/7 completed course of ampicillin-sulbactam for aspiration pneumonia 11/16 Trach 11/19 patient transferred out of ICU to hospitalist  service 11/23 patient developed worsened respiratory distress with severe tachypnea and severely diminished bilateral breath sounds concerning for mucous plug.  Patient be transferred back to ICU for ventilator support  Interim History / Subjective:  No acute overnight events  Patient is resting comfortably at this time on full vent support. She opens eyes but does not follow commands.   Objective   Blood pressure (!) 116/59, pulse 63, temperature 98.5 F (36.9 C), temperature source Axillary, resp. rate 16, height $RemoveBe'5\' 3"'[id number]$  (1.6 m), weight 116 kg, SpO2 100 %.    Vent Mode: PRVC FiO2 (%):  [28 %-30 %] 30 % Set Rate:  [24 bmp] 24 bmp Vt Set:  [410 mL] 410 mL PEEP:  [5 cmH20] 5 cmH20 Plateau Pressure:  [12 cmH20-15 cmH20] 15 cmH20   Intake/Output Summary (Last 24 hours) at 06/06/2021 0731 Last data filed at 06/06/2021 0600 Gross per 24 hour  Intake 4558.77 ml  Output 360 ml  Net 4198.77 ml   Filed Weights   06/04/21 0545 06/05/21 0400 06/06/21 0500  Weight: 114.4 kg 116.7 kg 116 kg    Examination: General: chronically ill-appearing elderly female in bed, no acute distress HEENT: 8 cuffed Shiley trach midline, MM pink/moist, PERRL,  Neuro: Eyes spontaneously open but not to follow commands CV: s1s2 regular rate and rhythm, no murmur, rubs, or gallops,  PULM: effort nl on full vent support; diminished breath sounds diffusely GI: soft, bowel sounds active in all 4 quadrants, non-tender, non-distended, tolerating TF Extremities: warm/dry, generalized nonpitting edema  Skin: no rashes or lesions  Resolved Hospital  Problem list   Possible aspiration pneumonia Left hip external rotation - hip fracture ruled out Hyperkalemia Hyponatremia AKI on CKD4 Metabolic acidosis  Assessment & Plan:  Acute on chronic respiratory failure with hypoxia requiring mechanical ventilation S/p tracheostomy 11/16-  Due to poor secretion clearance ability in setting of profound CNS injury patient  eventually required tracheostomy 11/16. She had been tolerating trach collar until 11/23 when noted to have worsening respiratory status requiring full vent support in setting of displaced tracheostomy tube.  - Continue ventilator support with lung protective strategies; trach collar trials as tolerated - Wean PEEP and FiO2 for sats greater than 90%. - VAP bundle  - Follow intermittent chest x-ray and ABG.   - Ensure adequate pulmonary hygiene  - Aggressive pulmonary hygiene - Hypertonic nebs and chest PT - Routine trach care  Methicillin-resistant Staphylococcus epidermis bacteremia Noted to have persistent fever x2 days with clear chest x-ray and lying. Therefore blood cultures were obtained 11/20 - positive for MRSE in both cultures. Felt possibly secondary to PIV vs decubitus ulcer. Leukocytosis improving on CBC. - ID consulted, appreciate recommendations - IV vancomycin x10 days - Trend CBC - Follow cultures  Multiform PVCs Patient with multiform PVC's on tele and EKG with intermittent bradycardia to 30's. No significant electrolyte abnormalities noted on labs. Likely in setting of acute illness. - Continue cardiac monitoring   Diffuse cerebral injury felt secondary to hypoglycemia Persistent vegetative state  Patient continues to have no noticeable neurological recovery. Ongoing palliative care discussions with patient's daughter who reports that patient would not want to live in persistent vegetative state but is not yet ready for transition to comfort care. - Palliative care consulted, appreciate assistance  -Maintain neuro protective measures; goal for eurothermia, euglycemia, eunatermia, normoxia, and PCO2 goal of 35-40 -Nutrition and bowel regiment  -Seizure precautions  -Aspirations precautions   Chronic kidney disease Baseline creatinine in the last several days has ranged between 1.6-1.7.  Creatinine in March of this year 2.26 -Trend renal function -Monitor urine  output -Avoid nephrotoxins  Type 2 diabetes Continue SSI CBG checks every 4 hours CBG goal 1 40-1 80  Stage II decubitus ulcer,not POA  Pressure  alleviating devices Local wound care  Dysphagia Plan for PEG cancelled as family considering transition to comfort care per 11/21 Palliative Care note -Continue core track with tube feeds currently -Protein supplementation  Anemia due to CKD and acute illness Trend CBC Transfuse per protocol Hemoglobin goal greater than 7  Goals of care Per chart review patient's daughter met with Dr. Loleta Books and met palliative care 11/21 and expressed desires to potentially move forward with comfort care measures.  At that time PEG tube placement was deferred. Ongoing discussions with palliative care on 11/23, patient's daughter continues to struggle with making a decision regarding code status and transition to comfort although does express that patient would not want to live this way.    Best Practice (right click and "Reselect all SmartList Selections" daily)   Diet/type: tubefeeds and NPO DVT prophylaxis: prophylactic heparin  GI prophylaxis: PPI Lines: N/A Foley:  N/A Code Status:  full code Last date of multidisciplinary goals of care discussion [ongoing [place] discussion with palliative care team and daughter regarding transitioning to comfort care    Critical care time:    Harvie Heck, MD Internal Medicine, PGY-3 06/06/21 7:31 AM Pager # [phone]

## 2021-06-06 NOTE — Progress Notes (Signed)
CPT held at this time due to patient experiencing unstable HR.

## 2021-06-07 ENCOUNTER — Inpatient Hospital Stay (HOSPITAL_COMMUNITY): Payer: Medicare (Managed Care)

## 2021-06-07 DIAGNOSIS — S062XAA Diffuse traumatic brain injury with loss of consciousness status unknown, initial encounter: Secondary | ICD-10-CM | POA: Diagnosis not present

## 2021-06-07 DIAGNOSIS — T8029XA Infection following other infusion, transfusion and therapeutic injection, initial encounter: Secondary | ICD-10-CM

## 2021-06-07 LAB — CBC WITH DIFFERENTIAL/PLATELET
Abs Immature Granulocytes: 0.37 10*3/uL — ABNORMAL HIGH (ref 0.00–0.07)
Basophils Absolute: 0.1 10*3/uL (ref 0.0–0.1)
Basophils Relative: 0 %
Eosinophils Absolute: 1 10*3/uL — ABNORMAL HIGH (ref 0.0–0.5)
Eosinophils Relative: 9 %
HCT: 25.6 % — ABNORMAL LOW (ref 36.0–46.0)
Hemoglobin: 7.7 g/dL — ABNORMAL LOW (ref 12.0–15.0)
Immature Granulocytes: 3 %
Lymphocytes Relative: 26 %
Lymphs Abs: 2.9 10*3/uL (ref 0.7–4.0)
MCH: 27.9 pg (ref 26.0–34.0)
MCHC: 30.1 g/dL (ref 30.0–36.0)
MCV: 92.8 fL (ref 80.0–100.0)
Monocytes Absolute: 1 10*3/uL (ref 0.1–1.0)
Monocytes Relative: 9 %
Neutro Abs: 5.9 10*3/uL (ref 1.7–7.7)
Neutrophils Relative %: 53 %
Platelets: 388 10*3/uL (ref 150–400)
RBC: 2.76 MIL/uL — ABNORMAL LOW (ref 3.87–5.11)
RDW: 12.5 % (ref 11.5–15.5)
WBC: 11.2 10*3/uL — ABNORMAL HIGH (ref 4.0–10.5)
nRBC: 0.5 % — ABNORMAL HIGH (ref 0.0–0.2)

## 2021-06-07 LAB — BASIC METABOLIC PANEL
Anion gap: 11 (ref 5–15)
BUN: 124 mg/dL — ABNORMAL HIGH (ref 8–23)
CO2: 22 mmol/L (ref 22–32)
Calcium: 9 mg/dL (ref 8.9–10.3)
Chloride: 103 mmol/L (ref 98–111)
Creatinine, Ser: 1.64 mg/dL — ABNORMAL HIGH (ref 0.44–1.00)
GFR, Estimated: 33 mL/min — ABNORMAL LOW (ref >60)
Glucose, Bld: 166 mg/dL — ABNORMAL HIGH (ref 70–99)
Potassium: 4.6 mmol/L (ref 3.5–5.1)
Sodium: 136 mmol/L (ref 135–145)

## 2021-06-07 LAB — GLUCOSE, CAPILLARY
Glucose-Capillary: 157 mg/dL — ABNORMAL HIGH (ref 70–99)
Glucose-Capillary: 147 mg/dL — ABNORMAL HIGH (ref 70–99)
Glucose-Capillary: 187 mg/dL — ABNORMAL HIGH (ref 70–99)
Glucose-Capillary: 144 mg/dL — ABNORMAL HIGH (ref 70–99)
Glucose-Capillary: 163 mg/dL — ABNORMAL HIGH (ref 70–99)
Glucose-Capillary: 163 mg/dL — ABNORMAL HIGH (ref 70–99)

## 2021-06-07 NOTE — Progress Notes (Signed)
RT NOTE: patient placed on 35% ATC.  Currently tolerating well at this time.  Will continue to monitor.  

## 2021-06-07 NOTE — Progress Notes (Signed)
Upper extremity venous RT study completed.  Preliminary results relayed to Maudie Mercury, RN via secure chat.  See CV Proc for preliminary results report.   Darlin Coco, RDMS, RVT

## 2021-06-07 NOTE — Progress Notes (Signed)
NAME:  Kimberly Gross, MRN:  [id number], DOB:  12-15-1950, LOS: 36 ADMISSION DATE:  05/13/2021, CONSULTATION DATE:  05/13/21 REFERRING MD:  Ronnald Nian - EM, CHIEF COMPLAINT:  AMS    History of Present Illness:  70 yo F PMH CKD IV, DM, bipolar disorder, HTN, Osa on CPAP who was found obtunded in bed by family member 10/31 afternoon found to be severely hypoglycemic which improved with glucagon but no improvement in mental status. Intubated for GCS 3. CT head unremarkable, CBC and CMP also unremarkable.  PCCM called to bedside 11/23 due to increased work of breathing.  Patient is significantly tachypneic with accessory muscle use.  Significant hypoxia and ABG reassuring at this time but patient's work of breathing is concerning for eventual respiratory continued.  Rectal patient will be transferred back to ICU for ventilator support and aggressive pulmonary toileting  Pertinent  Medical History   Past Medical History:  Diagnosis Date   Anemia    Anxiety    Bipolar 1 disorder ([place])    CKD (chronic kidney disease), stage IV (HCC)    Depression    Diabetes mellitus without complication ([place])    Hyperlipemia    Hypertension    Occasional tremors   OSA on BiPAP  Significant Hospital Events: Including procedures, antibiotic start and stop dates in addition to other pertinent events   10/31 Obtunded and hypoglycemic for EMS, mentation did not improve with glucagon. Intubated in ED, CT H non acute. Admitting to PCCM  11/1 LP performed by IR, CSF clear with no organisms on gram stain 11/2 routine EEG read as seizure activity, loaded with levetiracetam 11/3 LTM EEG reviewed, correlating with metabolic encephalopathy rather than epileptiform activity 11/5 no acute events overnight, remains on continuous EEG with no signs of seizures, did not wean on MV 11/7 completed course of ampicillin-sulbactam for aspiration pneumonia 11/16 Trach 11/19 patient transferred out of ICU to hospitalist  service 11/23 patient developed worsened respiratory distress with severe tachypnea and severely diminished bilateral breath sounds concerning for mucous plug.  Patient be transferred back to ICU for ventilator support 11/24> remained on full vent support; episodes of tachybradycardia sxs  Interim History / Subjective:  No acute overnight events  Patient is tolerating pressure support this morning. Spontaneously opening eyes but otherwise not following commands.   Objective   Blood pressure (!) 124/55, pulse 71, temperature 99.5 F (37.5 C), temperature source Axillary, resp. rate (!) 24, height $RemoveBe'5\' 3"'sToHCIrZQ$  (1.6 m), weight 115 kg, SpO2 100 %.    Vent Mode: PRVC FiO2 (%):  [30 %] 30 % Set Rate:  [24 bmp] 24 bmp Vt Set:  [140 mL-410 mL] 140 mL PEEP:  [5 cmH20] 5 cmH20 Plateau Pressure:  [12 cmH20-18 cmH20] 17 cmH20   Intake/Output Summary (Last 24 hours) at 06/07/2021 0708 Last data filed at 06/07/2021 0600 Gross per 24 hour  Intake 2439.7 ml  Output 1050 ml  Net 1389.7 ml   Filed Weights   06/05/21 0400 06/06/21 0500 06/07/21 0322  Weight: 116.7 kg 116 kg 115 kg    Examination: General: chronically ill-appearing elderly female in bed, no acute distress HEENT: 8 cuffed Shiley trach midline, MM pink/moist, PERRL,  Neuro: Eyes spontaneously open but does not follow commands CV: s1s2 regular rate and rhythm, no murmur, rubs, or gallops,  PULM: effort nl on pressure support; diminished breath sounds diffusely GI: soft, bowel sounds active in all 4 quadrants, non-tender, non-distended, tolerating TF Extremities: warm/dry, generalized nonpitting edema  Skin: no  rashes or lesions  Resolved Hospital Problem list   Possible aspiration pneumonia Left hip external rotation - hip fracture ruled out Hyperkalemia Hyponatremia AKI on CKD4 Metabolic acidosis  Assessment & Plan:  Acute on chronic respiratory failure with hypoxia requiring mechanical ventilation S/p tracheostomy 11/16-  Due  to poor secretion clearance ability in setting of profound CNS injury patient eventually required tracheostomy 11/16. She had been tolerating trach collar until 11/23 when noted to have worsening respiratory status requiring full vent support in setting of displaced tracheostomy tube. Tolerating CPAP 10/5 this AM.  - Continue ventilator support with lung protective strategies; SBT and trach collar trials as tolerated - Wean PEEP and FiO2 for sats greater than 90%. - VAP bundle  - Follow intermittent chest x-ray and ABG.   - Ensure adequate pulmonary hygiene  - Aggressive pulmonary hygiene - Hypertonic nebs and chest PT - Routine trach care  Methicillin-resistant Staphylococcus epidermis bacteremia Noted to have persistent fever x2 days with clear chest x-ray and lying. Therefore blood cultures were obtained 11/20 - positive for MRSE in both cultures. Felt possibly secondary to PIV vs decubitus ulcer. Leukocytosis stable this AM on CBC. - ID consulted, appreciate recommendations - IV vancomycin x10 days - Trend CBC  Tachybradycardia syndrome Patient with multiform PVC's on tele and EKG with intermittent bradycardia to 30's. Troponins mildly elevated and EKG without signs of ischemia. No significant electrolyte abnormalities noted on labs. Likely in setting of acute illness. Will hold off on cardiology consult for now.  - Continue cardiac monitoring   Diffuse cerebral injury felt secondary to hypoglycemia Persistent vegetative state  Patient continues to have no noticeable neurological recovery. Ongoing palliative care discussions with patient's daughter who reports that patient would not want to live in persistent vegetative state but is not yet ready for transition to comfort care. - Palliative care consulted, appreciate assistance  -Maintain neuro protective measures; goal for eurothermia, euglycemia, eunatermia, normoxia, and PCO2 goal of 35-40 -Nutrition and bowel regiment  -Seizure  precautions  -Aspirations precautions   Chronic kidney disease, stable Baseline creatinine in the last several days has ranged between 1.6-1.7.  Creatinine in March of this year 2.26 -Trend renal function -Monitor urine output -Avoid nephrotoxins  Type 2 diabetes Continue SSI CBG checks every 4 hours CBG goal 140-1 80  Stage II decubitus ulcer,not POA  Pressure alleviating devices Local wound care  Dysphagia Plan for PEG cancelled as family considering transition to comfort care per 11/21 Palliative Care note -Continue core track with tube feeds currently -Protein supplementation  Anemia due to CKD and acute illness Trend CBC Transfuse per protocol Hemoglobin goal greater than 7  Goals of care Per chart review patient's daughter met with Dr. Loleta Books and met palliative care 11/21 and expressed desires to potentially move forward with comfort care measures.  At that time PEG tube placement was deferred. Ongoing discussions with palliative care on 11/23, patient's daughter continues to struggle with making a decision regarding code status and transition to comfort although does express that patient would not want to live this way.    Best Practice (right click and "Reselect all SmartList Selections" daily)   Diet/type: tubefeeds and NPO DVT prophylaxis: prophylactic heparin  GI prophylaxis: PPI Lines: N/A Foley:  N/A Code Status:  full code Last date of multidisciplinary goals of care discussion [ongoing [place] discussion with palliative care team and daughter regarding transitioning to comfort care    Critical care time:    Harvie Heck, MD Internal Medicine,  PGY-3 06/07/21 7:08 AM Pager # [phone]

## 2021-06-08 DIAGNOSIS — S062XAA Diffuse traumatic brain injury with loss of consciousness status unknown, initial encounter: Secondary | ICD-10-CM | POA: Diagnosis not present

## 2021-06-08 LAB — GLUCOSE, CAPILLARY
Glucose-Capillary: 158 mg/dL — ABNORMAL HIGH (ref 70–99)
Glucose-Capillary: 200 mg/dL — ABNORMAL HIGH (ref 70–99)
Glucose-Capillary: 149 mg/dL — ABNORMAL HIGH (ref 70–99)
Glucose-Capillary: 168 mg/dL — ABNORMAL HIGH (ref 70–99)
Glucose-Capillary: 164 mg/dL — ABNORMAL HIGH (ref 70–99)
Glucose-Capillary: 173 mg/dL — ABNORMAL HIGH (ref 70–99)

## 2021-06-08 LAB — BASIC METABOLIC PANEL
Anion gap: 10 (ref 5–15)
BUN: 119 mg/dL — ABNORMAL HIGH (ref 8–23)
CO2: 23 mmol/L (ref 22–32)
Calcium: 9 mg/dL (ref 8.9–10.3)
Chloride: 102 mmol/L (ref 98–111)
Creatinine, Ser: 1.51 mg/dL — ABNORMAL HIGH (ref 0.44–1.00)
GFR, Estimated: 37 mL/min — ABNORMAL LOW (ref >60)
Glucose, Bld: 164 mg/dL — ABNORMAL HIGH (ref 70–99)
Potassium: 4.2 mmol/L (ref 3.5–5.1)
Sodium: 135 mmol/L (ref 135–145)

## 2021-06-08 LAB — CBC
HCT: 25.8 % — ABNORMAL LOW (ref 36.0–46.0)
Hemoglobin: 7.9 g/dL — ABNORMAL LOW (ref 12.0–15.0)
MCH: 28 pg (ref 26.0–34.0)
MCHC: 30.6 g/dL (ref 30.0–36.0)
MCV: 91.5 fL (ref 80.0–100.0)
Platelets: 381 10*3/uL (ref 150–400)
RBC: 2.82 MIL/uL — ABNORMAL LOW (ref 3.87–5.11)
RDW: 12.6 % (ref 11.5–15.5)
WBC: 12.1 10*3/uL — ABNORMAL HIGH (ref 4.0–10.5)
nRBC: 0.6 % — ABNORMAL HIGH (ref 0.0–0.2)

## 2021-06-08 LAB — PHOSPHORUS: Phosphorus: 5.3 mg/dL — ABNORMAL HIGH (ref 2.5–4.6)

## 2021-06-08 LAB — MAGNESIUM: Magnesium: 2.9 mg/dL — ABNORMAL HIGH (ref 1.7–2.4)

## 2021-06-08 NOTE — Plan of Care (Signed)
  Problem: Health Behavior/Discharge Planning: Goal: Ability to manage health-related needs will improve Outcome: Not Progressing   Problem: Clinical Measurements: Goal: Will remain free from infection Outcome: Not Progressing Goal: Diagnostic test results will improve Outcome: Not Progressing Goal: Respiratory complications will improve Outcome: Not Progressing Goal: Cardiovascular complication will be avoided Outcome: Not Progressing   Problem: Activity: Goal: Risk for activity intolerance will decrease Outcome: Not Progressing   Problem: Nutrition: Goal: Adequate nutrition will be maintained Outcome: Not Progressing   Problem: Coping: Goal: Level of anxiety will decrease Outcome: Not Progressing   Problem: Elimination: Goal: Will not experience complications related to bowel motility Outcome: Not Progressing Goal: Will not experience complications related to urinary retention Outcome: Not Progressing   Problem: Pain Managment: Goal: General experience of comfort will improve Outcome: Not Progressing   Problem: Safety: Goal: Ability to remain free from injury will improve Outcome: Not Progressing   Problem: Skin Integrity: Goal: Risk for impaired skin integrity will decrease Outcome: Not Progressing   Problem: Activity: Goal: Ability to tolerate increased activity will improve Outcome: Not Progressing   Problem: Respiratory: Goal: Ability to maintain a clear airway and adequate ventilation will improve Outcome: Not Progressing   Problem: Role Relationship: Goal: Method of communication will improve Outcome: Not Progressing

## 2021-06-08 NOTE — Progress Notes (Signed)
Patient was tachypneic, diaphoretic, and tachycardic. Erin Fulling, MD notified. RT put patient back on vent. Transfer orders discontinued.   Pt. Vitals are stable and no distress noted at this time.

## 2021-06-08 NOTE — Progress Notes (Signed)
NAME:  Kimberly Gross, MRN:  [id number], DOB:  October 08, 1950, LOS: 78 ADMISSION DATE:  05/13/2021, CONSULTATION DATE:  05/13/21 REFERRING MD:  Ronnald Nian - EM, CHIEF COMPLAINT:  AMS    History of Present Illness:  70 yo F PMH CKD IV, DM, bipolar disorder, HTN, Osa on CPAP who was found obtunded in bed by family member 10/31 afternoon found to be severely hypoglycemic which improved with glucagon but no improvement in mental status. Intubated for GCS 3. CT head unremarkable, CBC and CMP also unremarkable.  PCCM called to bedside 11/23 due to increased work of breathing.  Patient is significantly tachypneic with accessory muscle use.  Significant hypoxia and ABG reassuring at this time but patient's work of breathing is concerning for eventual respiratory continued.  Rectal patient will be transferred back to ICU for ventilator support and aggressive pulmonary toileting  Pertinent  Medical History   Past Medical History:  Diagnosis Date   Anemia    Anxiety    Bipolar 1 disorder ([place])    CKD (chronic kidney disease), stage IV (HCC)    Depression    Diabetes mellitus without complication ([place])    Hyperlipemia    Hypertension    Occasional tremors   OSA on BiPAP  Significant Hospital Events: Including procedures, antibiotic start and stop dates in addition to other pertinent events   10/31 Obtunded and hypoglycemic for EMS, mentation did not improve with glucagon. Intubated in ED, CT H non acute. Admitting to PCCM  11/1 LP performed by IR, CSF clear with no organisms on gram stain 11/2 routine EEG read as seizure activity, loaded with levetiracetam 11/3 LTM EEG reviewed, correlating with metabolic encephalopathy rather than epileptiform activity 11/5 no acute events overnight, remains on continuous EEG with no signs of seizures, did not wean on MV 11/7 completed course of ampicillin-sulbactam for aspiration pneumonia 11/16 Trach 11/19 patient transferred out of ICU to hospitalist  service 11/23 patient developed worsened respiratory distress with severe tachypnea and severely diminished bilateral breath sounds concerning for mucous plug.  Patient be transferred back to ICU for ventilator support 11/24> remained on full vent support; episodes of tachybradycardia sxs  Interim History / Subjective:  No acute overnight events   Tolerating trach collar since yesterday afternoon.  Objective   Blood pressure 130/64, pulse 68, temperature 98.9 F (37.2 C), temperature source Axillary, resp. rate 18, height $RemoveBe'5\' 3"'saNmyjmjW$  (1.6 m), weight 114.4 kg, SpO2 100 %.    FiO2 (%):  [25 %-35 %] 28 %   Intake/Output Summary (Last 24 hours) at 06/08/2021 0819 Last data filed at 06/08/2021 0700 Gross per 24 hour  Intake 1866.65 ml  Output 950 ml  Net 916.65 ml   Filed Weights   06/06/21 0500 06/07/21 0322 06/08/21 0500  Weight: 116 kg 115 kg 114.4 kg    Examination: General: chronically ill-appearing elderly female in bed, no acute distress HEENT: 8 cuffed Shiley trach midline, MM pink/moist, PERRL,  Neuro: Eyes spontaneously open but does not follow commands CV: s1s2 regular rate and rhythm, no murmur, rubs, or gallops,  PULM: diminished breath sounds, no wheezing GI: soft, bowel sounds active in all 4 quadrants, non-distended Extremities: warm/dry, generalized nonpitting edema  Skin: no rashes or lesions  Resolved Hospital Problem list   Possible aspiration pneumonia Left hip external rotation - hip fracture ruled out Hyperkalemia Hyponatremia AKI on CKD4 Metabolic acidosis  Assessment & Plan:  Acute on chronic respiratory failure with hypoxia requiring mechanical ventilation S/p tracheostomy 11/16-  -  Tolerating trach collar since yesterday afternoon - Ensure adequate pulmonary hygiene  - Aggressive pulmonary hygiene - Hypertonic nebs and chest PT - Routine trach care  Methicillin-resistant Staphylococcus epidermis bacteremia Noted to have persistent fever x2 days  with clear chest x-ray and lying. Therefore blood cultures were obtained 11/20 - positive for MRSE in both cultures. Felt possibly secondary to PIV vs decubitus ulcer. Leukocytosis stable this AM on CBC. - ID consulted, appreciate recommendations - IV vancomycin x10 days - Trend CBC - repeat blood cultures drawn 11/25  Tachybradycardia syndrome Patient with multiform PVC's on tele and EKG with intermittent bradycardia to 30's. Troponins mildly elevated and EKG without signs of ischemia. No significant electrolyte abnormalities noted on labs. Likely in setting of acute illness. Will hold off on cardiology consult for now.  - Continue cardiac monitoring   Diffuse cerebral injury felt secondary to hypoglycemia Persistent vegetative state  Patient continues to have no noticeable neurological recovery. Ongoing palliative care discussions with patient's daughter who reports that patient would not want to live in persistent vegetative state but is not yet ready for transition to comfort care. - Palliative care consulted, appreciate assistance  -Maintain neuro protective measures; goal for eurothermia, euglycemia, eunatermia, normoxia, and PCO2 goal of 35-40 -Nutrition and bowel regiment  -Seizure precautions  -Aspirations precautions   Chronic kidney disease, stable Baseline creatinine in the last several days has ranged between 1.6-1.7.  Creatinine in March of this year 2.26 -Trend renal function -Monitor urine output -Avoid nephrotoxins  Type 2 diabetes Continue SSI CBG checks every 4 hours CBG goal 140-1 80  Stage II decubitus ulcer,not POA  Pressure alleviating devices Local wound care  Dysphagia Plan for PEG cancelled as family considering transition to comfort care per 11/21 Palliative Care note -Continue core track with tube feeds currently -Protein supplementation  Anemia due to CKD and acute illness Trend CBC Transfuse per protocol Hemoglobin goal greater than 7  Goals  of care Per chart review patient's daughter met with Dr. Loleta Books and met palliative care 11/21 and expressed desires to potentially move forward with comfort care measures.  At that time PEG tube placement was deferred. Ongoing discussions with palliative care on 11/23, patient's daughter continues to struggle with making a decision regarding code status and transition to comfort although does express that patient would not want to live this way.    Best Practice (right click and "Reselect all SmartList Selections" daily)   Diet/type: tubefeeds and NPO DVT prophylaxis: prophylactic heparin  GI prophylaxis: PPI Lines: N/A Foley:  N/A Code Status:  full code Last date of multidisciplinary goals of care discussion [ongoing [place] discussion with palliative care team and daughter regarding transitioning to comfort care    Critical care time:    Freda Jackson, MD [place] Office: [phone]   See Amion for personal pager PCCM on call pager [phone] until 7pm. Please call [place] 7p-7a. [phone]

## 2021-06-08 NOTE — Progress Notes (Signed)
      INFECTIOUS DISEASE ATTENDING ADDENDUM:   Date: 06/08/2021  Patient name: Kimberly Gross  Medical record number: [id number]  Date of birth: 04-02-1951   The patient has some superficial phlebitis but no deep venous thrombosis.  She should build to be treated with 10 days of vancomycin in terms of treating her nosocomial methicillin-resistant Staph epidermidis bacteremia.  She remains critically ill and in the ICU.  I will sign off for now please call with further questions   Alcide Evener 06/08/2021, 4:12 PM

## 2021-06-09 DIAGNOSIS — Z515 Encounter for palliative care: Secondary | ICD-10-CM | POA: Diagnosis not present

## 2021-06-09 DIAGNOSIS — S062XAA Diffuse traumatic brain injury with loss of consciousness status unknown, initial encounter: Secondary | ICD-10-CM | POA: Diagnosis not present

## 2021-06-09 LAB — COMPREHENSIVE METABOLIC PANEL
ALT: 36 U/L (ref 0–44)
AST: 35 U/L (ref 15–41)
Albumin: 2.1 g/dL — ABNORMAL LOW (ref 3.5–5.0)
Alkaline Phosphatase: 215 U/L — ABNORMAL HIGH (ref 38–126)
Anion gap: 11 (ref 5–15)
BUN: 112 mg/dL — ABNORMAL HIGH (ref 8–23)
CO2: 21 mmol/L — ABNORMAL LOW (ref 22–32)
Calcium: 8.8 mg/dL — ABNORMAL LOW (ref 8.9–10.3)
Chloride: 104 mmol/L (ref 98–111)
Creatinine, Ser: 1.46 mg/dL — ABNORMAL HIGH (ref 0.44–1.00)
GFR, Estimated: 38 mL/min — ABNORMAL LOW (ref >60)
Glucose, Bld: 203 mg/dL — ABNORMAL HIGH (ref 70–99)
Potassium: 4.2 mmol/L (ref 3.5–5.1)
Sodium: 136 mmol/L (ref 135–145)
Total Bilirubin: 0.5 mg/dL (ref 0.3–1.2)
Total Protein: 5.7 g/dL — ABNORMAL LOW (ref 6.5–8.1)

## 2021-06-09 LAB — CBC WITH DIFFERENTIAL/PLATELET
Abs Immature Granulocytes: 0.28 10*3/uL — ABNORMAL HIGH (ref 0.00–0.07)
Basophils Absolute: 0.1 10*3/uL (ref 0.0–0.1)
Basophils Relative: 1 %
Eosinophils Absolute: 0.9 10*3/uL — ABNORMAL HIGH (ref 0.0–0.5)
Eosinophils Relative: 8 %
HCT: 23 % — ABNORMAL LOW (ref 36.0–46.0)
Hemoglobin: 7.1 g/dL — ABNORMAL LOW (ref 12.0–15.0)
Immature Granulocytes: 2 %
Lymphocytes Relative: 24 %
Lymphs Abs: 2.8 10*3/uL (ref 0.7–4.0)
MCH: 28.2 pg (ref 26.0–34.0)
MCHC: 30.9 g/dL (ref 30.0–36.0)
MCV: 91.3 fL (ref 80.0–100.0)
Monocytes Absolute: 1.1 10*3/uL — ABNORMAL HIGH (ref 0.1–1.0)
Monocytes Relative: 9 %
Neutro Abs: 6.6 10*3/uL (ref 1.7–7.7)
Neutrophils Relative %: 56 %
Platelets: 378 10*3/uL (ref 150–400)
RBC: 2.52 MIL/uL — ABNORMAL LOW (ref 3.87–5.11)
RDW: 12.7 % (ref 11.5–15.5)
WBC: 11.7 10*3/uL — ABNORMAL HIGH (ref 4.0–10.5)
nRBC: 0.3 % — ABNORMAL HIGH (ref 0.0–0.2)

## 2021-06-09 LAB — GLUCOSE, CAPILLARY
Glucose-Capillary: 136 mg/dL — ABNORMAL HIGH (ref 70–99)
Glucose-Capillary: 191 mg/dL — ABNORMAL HIGH (ref 70–99)
Glucose-Capillary: 162 mg/dL — ABNORMAL HIGH (ref 70–99)
Glucose-Capillary: 161 mg/dL — ABNORMAL HIGH (ref 70–99)
Glucose-Capillary: 152 mg/dL — ABNORMAL HIGH (ref 70–99)

## 2021-06-09 MED ORDER — FREE WATER
200.0000 mL | Status: DC
Start: 2021-06-09 — End: 2021-06-13
  Administered 2021-06-09 – 2021-06-13 (×24): 200 mL

## 2021-06-09 NOTE — Progress Notes (Signed)
PROGRESS NOTE   Kimberly Gross  PPQ:[id number] DOB: Jul 24, 1950 DOA: 05/13/2021 PCP: Arnette Felts, FNP  Brief Narrative:  69 year old lives with family morbidly obese black female BMI 44 HTN, bipolar, DM TY 2 with nephropathy and CKD 4, OSA on CPAP Patient found obtunded 10/31 2022-CBG was 30 improved with glucagon slightly to 75 but mentation did not GCS was 3 ABG 7.3 patient was intubated  10/31 Obtunded and hypoglycemic for EMS, mentation did not improve with glucagon. CT head unremarkable.  Intubated and amitted to ICU; started on Unasyn for aspiration pneumonia 11/1 LP normal 11/2 Seizures noted on EEG, started Keppra 11/3 LTM EEG reviewed, correlating with metabolic encephalopathy rather than epileptiform activity 11/4-11/15 No improvement in neurological function 11/16 Trach placed 11/19 Transferred to [facility] 11/21 In preparation for PEG placement and transfer to long term care, goals of care discussion with daughter, TRH and Palliative care; Daughter feels that patient would not have wanted to continue in current state, PEG on hold, daughter will discuss with family, may pursue Hospice 11/22 Fever again --> Blood cultures with MRSE; started vancomycin, ID consulted 11/23 Respiratory distress with tachypnea.  clinically patient was worseningmove the patient to ICU for full ventilatory support. 11/27 transfer back to hospitalist service      Hospital-Problem based course  MRSE PNA, Blood cult 11/20 +, ? PIV vs Decubiti Vancomycin x 10 d ending 11/30 Consider rpt CXR but is now DNR Leukocytosis persistent AKI superimposed on CKD 2 Very elevated BUN.  Increase Free water to 200 q4 Rpt labs periodically. D/c HCTZ Tachybrady syndrome Predominantly PVC on monitors-check magnesium next labs Cont Coreg 25 bid Keep on monitors Diarrhea Neuroglycopenia causing Persistent Vegetative state Unclear family wishes but patient has made poor progress in 25 odd days Will re-engage with  family to determine next steps--appreciate Palliative input Family undecided about PEG and await further clarification from them re: next steps Trach 2/2 inability to protect airway  PEr CCM, Trach collar during day, Vent at night Dm ty ii Hold SSI as well as Semglee 8 U  DVT prophylaxis:  Code Status: DNR decided upon 11/27 Family Communication: called Deanna Artis [phone]-patient's daughter understands lack of progression and implications of this and she has decided on DNR status Logistically planning will need to occur with regards to the transition as patient is dependent on the vent at night-I have engaged TOC but I think potentially the patient might have a hospital death if family wishes to take patient home however may be able to transport home on vent and then allow transition to occur at home off of the vent She will visit tomorrow to voice her opinion and preference Disposition:  Status is: Inpatient  Remains inpatient appropriate because: High risk patient       Consultants:  Critical care  Procedures: Multiple  Antimicrobials: Vancomycin   Subjective: Unresponsive does not blink in response to threat Loose green wet stool second for the day-had 3 overnight Cannot get review of systems   Objective: Vitals:   06/09/21 0500 06/09/21 0600 06/09/21 0715 06/09/21 0729  BP: 137/64 (!) 151/103    Pulse: 68 78  75  Resp: 18 19  19   Temp:   98.5 F (36.9 C)   TempSrc:   Oral   SpO2: 98% 96%  100%  Weight: 113.7 kg     Height:        Intake/Output Summary (Last 24 hours) at 06/09/2021 0755 Last data filed at 06/09/2021 0600 Gross per 24 hour  Intake 2770 ml  Output 1900 ml  Net 870 ml   Filed Weights   06/07/21 0322 06/08/21 0500 06/09/21 0500  Weight: 115 kg 114.4 kg 113.7 kg    Examination:  Debilitated ill-appearing black female-nonresponsive GCS about 3-5 Chest clear no rales rhonchi NG tube in place Abdomen obese nontender no rebound Trace  lower extremity edema Does not follow commands for neurological exam Babinski upgoing  Data Reviewed: personally reviewed   CBC    Component Value Date/Time   WBC 12.1 (H) 06/08/2021 0230   RBC 2.82 (L) 06/08/2021 0230   HGB 7.9 (L) 06/08/2021 0230   HGB 11.7 09/12/2020 1152   HCT 25.8 (L) 06/08/2021 0230   HCT 36.9 09/12/2020 1152   PLT 381 06/08/2021 0230   PLT 298 09/12/2020 1152   MCV 91.5 06/08/2021 0230   MCV 91 09/12/2020 1152   MCH 28.0 06/08/2021 0230   MCHC 30.6 06/08/2021 0230   RDW 12.6 06/08/2021 0230   RDW 11.8 09/12/2020 1152   LYMPHSABS 2.9 06/07/2021 0148   MONOABS 1.0 06/07/2021 0148   EOSABS 1.0 (H) 06/07/2021 0148   BASOSABS 0.1 06/07/2021 0148   CMP Latest Ref Rng & Units 06/08/2021 06/07/2021 06/06/2021  Glucose 70 - 99 mg/dL 164(H) 166(H) 184(H)  BUN 8 - 23 mg/dL 119(H) 124(H) 113(H)  Creatinine 0.44 - 1.00 mg/dL 1.51(H) 1.64(H) 1.61(H)  Sodium 135 - 145 mmol/L 135 136 137  Potassium 3.5 - 5.1 mmol/L 4.2 4.6 4.6  Chloride 98 - 111 mmol/L 102 103 104  CO2 22 - 32 mmol/L $RemoveB'23 22 22  'eLLAScDV$ Calcium 8.9 - 10.3 mg/dL 9.0 9.0 8.9  Total Protein 6.5 - 8.1 g/dL - - -  Total Bilirubin 0.3 - 1.2 mg/dL - - -  Alkaline Phos 38 - 126 U/L - - -  AST 15 - 41 U/L - - -  ALT 0 - 44 U/L - - -     Radiology Studies: VAS [place] UPPER EXTREMITY VENOUS DUPLEX  Result Date: 06/08/2021 UPPER VENOUS STUDY  Patient Name:  Kimberly Gross  Date of Exam:   06/07/2021 Medical Rec #: [id number]        Accession #:    [id number] Date of Birth: 1951-05-22        Patient Gender: F Patient Age:   43 years Exam Location:  [facility] Procedure:      VAS [place] UPPER EXTREMITY VENOUS DUPLEX Referring Phys: CORNELIUS VAN DAM --------------------------------------------------------------------------------  Indications: Recent IV right forearm Limitations: Body habitus, poor ultrasound/tissue interface and patient unable to reposition. Performing Technologist: Darlin Coco RDMS, RVT  Examination  Guidelines: A complete evaluation includes B-mode imaging, spectral Doppler, color Doppler, and power Doppler as needed of all accessible portions of each vessel. Bilateral testing is considered an integral part of a complete examination. Limited examinations for reoccurring indications may be performed as noted.  Right Findings: +----------+------------+---------+-----------+----------+---------------------+ RIGHT     CompressiblePhasicitySpontaneousProperties       Summary        +----------+------------+---------+-----------+----------+---------------------+ IJV           Full       Yes       Yes                                    +----------+------------+---------+-----------+----------+---------------------+ Subclavian    Full       Yes       Yes                                    +----------+------------+---------+-----------+----------+---------------------+  Axillary      Full       Yes       Yes                                    +----------+------------+---------+-----------+----------+---------------------+ Brachial      Full                                                        +----------+------------+---------+-----------+----------+---------------------+ Radial        Full                                   Not well visualized                                                       due to positioning   +----------+------------+---------+-----------+----------+---------------------+ Ulnar         Full                                   Not well visualized                                                       due to positioning   +----------+------------+---------+-----------+----------+---------------------+ Cephalic      None       No        No                 Age Indeterminate   +----------+------------+---------+-----------+----------+---------------------+ Basilic                                                Not visualized      +----------+------------+---------+-----------+----------+---------------------+  Left Findings: +----------+------------+---------+-----------+----------+-------+ LEFT      CompressiblePhasicitySpontaneousPropertiesSummary +----------+------------+---------+-----------+----------+-------+ Subclavian               Yes       Yes                      +----------+------------+---------+-----------+----------+-------+  Summary:  Right: No evidence of deep vein thrombosis in the upper extremity. Findings consistent with age indeterminate superficial vein thrombosis involving the right cephalic vein. However, unable to visualize the basilic vein.  Left: No evidence of thrombosis in the subclavian.  *See table(s) above for measurements and observations.  Diagnosing physician: Orlie Pollen Electronically signed by Orlie Pollen on 06/08/2021 at 10:39:52 AM.    Final      Scheduled Meds:  amLODipine  10 mg Per Tube Daily   aspirin  81 mg Per Tube Daily   atorvastatin  40 mg Per Tube QHS   carvedilol  25 mg Per Tube  BID WC   chlorhexidine gluconate (MEDLINE KIT)  15 mL Mouth Rinse BID   Chlorhexidine Gluconate Cloth  6 each Topical Daily   enoxaparin (LOVENOX) injection  60 mg Subcutaneous Q24H   feeding supplement (PROSource TF)  45 mL Per Tube TID   fentaNYL (SUBLIMAZE) injection  25 mcg Intravenous Once   fiber  2 packet Per Tube TID   free water  200 mL Per Tube Q6H   Gerhardt's butt cream   Topical BID   hydrALAZINE  50 mg Per Tube Q8H   hydrochlorothiazide  25 mg Per Tube Daily   insulin aspart  0-9 Units Subcutaneous Q4H   insulin glargine-yfgn  8 Units Subcutaneous Daily   levETIRAcetam  500 mg Per Tube BID   mouth rinse  15 mL Mouth Rinse 10 times per day   pantoprazole sodium  40 mg Per Tube QHS   Continuous Infusions:  sodium chloride 10 mL/hr at 06/04/21 1648   feeding supplement (JEVITY 1.5 CAL/FIBER) 1,000 mL (06/08/21 1800)   fentaNYL infusion INTRAVENOUS Stopped  (06/07/21 1134)   vancomycin Stopped (06/08/21 2003)     LOS: 27 days   Time spent: Broussard, MD [facility] To contact the attending provider between 7A-7P or the covering provider during after hours 7P-7A, please log into the web site [email or web address] and access using universal [place] password for that web site. If you do not have the password, please call the hospital operator.  06/09/2021, 7:55 AM

## 2021-06-09 NOTE — Progress Notes (Signed)
PMT brief progress note.  Patient seen resting in bed, does not respond, does not follow commands. Chart reviewed, discussed with bedside RN.   BP (!) 122/59   Pulse 67   Temp 99.4 F (37.4 C) (Axillary)   Resp (!) 21   Ht 5\' 3"  (1.6 m)   Wt 113.7 kg   SpO2 96%   BMI 44.40 kg/m  70 yo F PMH CKD IV, DM, bipolar disorder, HTN, Osa on CPAP who was found obtunded in bed by family member 10/31 afternoon found to be severely hypoglycemic which improved with glucagon but no improvement in mental status. Intubated for GCS 3, now has trach, on trach collar/vent, PMT following for goals of care discussions.  Call placed, unable to reach daughter [name] at this time, patient's PMT provider, my colleague Ms Jerline Pain, NP will be back tomorrow 06-10-21, PMT to follow up on 06-10-21. Will continue to recommend consideration of DNR and no PEG at this time. Further recommendations to follow.  25 minutes spent.  Greater than 50%  of this time was spent counseling and coordinating care related to the above assessment and plan   Loistine Chance MD [facility]

## 2021-06-09 NOTE — Progress Notes (Signed)
NAME:  Kimberly Gross, MRN:  [id number], DOB:  05-30-1951, LOS: 27 ADMISSION DATE:  05/13/2021, CONSULTATION DATE:  05/13/21 REFERRING MD:  Lockie Mola - EM, CHIEF COMPLAINT:  AMS    History of Present Illness:  70 yo F PMH CKD IV, DM, bipolar disorder, HTN, Osa on CPAP who was found obtunded in bed by family member 10/31 afternoon found to be severely hypoglycemic which improved with glucagon but no improvement in mental status. Intubated for GCS 3. CT head unremarkable, CBC and CMP also unremarkable.  PCCM called to bedside 11/23 due to increased work of breathing.  Patient is significantly tachypneic with accessory muscle use.  Significant hypoxia and ABG reassuring at this time but patient's work of breathing is concerning for eventual respiratory continued.  Rectal patient will be transferred back to ICU for ventilator support and aggressive pulmonary toileting  Pertinent  Medical History   Past Medical History:  Diagnosis Date   Anemia    Anxiety    Bipolar 1 disorder (HCC)    CKD (chronic kidney disease), stage IV (HCC)    Depression    Diabetes mellitus without complication (HCC)    Hyperlipemia    Hypertension    Occasional tremors   OSA on BiPAP  Significant Hospital Events: Including procedures, antibiotic start and stop dates in addition to other pertinent events   10/31 Obtunded and hypoglycemic for EMS, mentation did not improve with glucagon. Intubated in ED, CT H non acute. Admitting to PCCM  11/1 LP performed by IR, CSF clear with no organisms on gram stain 11/2 routine EEG read as seizure activity, loaded with levetiracetam 11/3 LTM EEG reviewed, correlating with metabolic encephalopathy rather than epileptiform activity 11/5 no acute events overnight, remains on continuous EEG with no signs of seizures, did not wean on MV 11/7 completed course of ampicillin-sulbactam for aspiration pneumonia 11/16 Trach 11/19 patient transferred out of ICU to hospitalist  service 11/23 patient developed worsened respiratory distress with severe tachypnea and severely diminished bilateral breath sounds concerning for mucous plug.  Patient be transferred back to ICU for ventilator support 11/24> remained on full vent support; episodes of tachybradycardia sxs  Interim History / Subjective:   Patient had been on trach collar for nearly 48 hours before she developed increase work of breathing, was placed back on vent overnight and is back on trach collar this morning doing well.   No other acute events.  Objective   Blood pressure (!) 151/103, pulse 75, temperature 98.5 F (36.9 C), temperature source Oral, resp. rate 19, height 5\' 3"  (1.6 m), weight 113.7 kg, SpO2 100 %.    Vent Mode: PSV;CPAP FiO2 (%):  [28 %-30 %] 30 % PEEP:  [5 cmH20] 5 cmH20 Pressure Support:  [5 cmH20] 5 cmH20 Plateau Pressure:  [18 cmH20] 18 cmH20   Intake/Output Summary (Last 24 hours) at 06/09/2021 0739 Last data filed at 06/09/2021 0600 Gross per 24 hour  Intake 2770 ml  Output 1900 ml  Net 870 ml   Filed Weights   06/07/21 0322 06/08/21 0500 06/09/21 0500  Weight: 115 kg 114.4 kg 113.7 kg    Examination: General: chronically ill-appearing elderly female in bed, no acute distress HEENT: 8 cuffed Shiley trach midline, MM pink/moist, PERRL,  Neuro: Eyes spontaneously open but does not follow commands CV: s1s2 regular rate and rhythm, no murmur, rubs, or gallops,  PULM: diminished breath sounds, no wheezing GI: soft, bowel sounds active in all 4 quadrants, non-distended Extremities: warm/dry, generalized nonpitting edema  Skin: no rashes or lesions  Resolved Hospital Problem list   Possible aspiration pneumonia Left hip external rotation - hip fracture ruled out Hyperkalemia Hyponatremia AKI on CKD4 Metabolic acidosis  Assessment & Plan:  Acute on chronic respiratory failure with hypoxia requiring nocturnal mechanical ventilation S/p tracheostomy 11/16 -  Tolerating trach collar during day, and mechanical ventilation at night - Ensure adequate pulmonary hygiene  - Hypertonic nebs and chest PT - Routine trach care  Methicillin-resistant Staphylococcus epidermis bacteremia Noted to have persistent fever x2 days with clear chest x-ray and lying. Therefore blood cultures were obtained 11/20 - positive for MRSE in both cultures. Felt possibly secondary to PIV vs decubitus ulcer. Leukocytosis stable this AM on CBC. - ID consulted, appreciate recommendations - IV vancomycin x10 days - Trend CBC - repeat blood cultures drawn 11/25  Tachybradycardia syndrome Patient with multiform PVC's on tele and EKG with intermittent bradycardia to 30's. Troponins mildly elevated and EKG without signs of ischemia. No significant electrolyte abnormalities noted on labs. Likely in setting of acute illness. Will hold off on cardiology consult for now.  - Continue cardiac monitoring   Diffuse cerebral injury felt secondary to hypoglycemia Persistent vegetative state  Patient continues to have no noticeable neurological recovery. Ongoing palliative care discussions with patient's daughter who reports that patient would not want to live in persistent vegetative state but is not yet ready for transition to comfort care. - Palliative care consulted, appreciate assistance  -Maintain neuro protective measures; goal for eurothermia, euglycemia, eunatermia, normoxia, and PCO2 goal of 35-40 -Nutrition and bowel regiment  -Seizure precautions  -Aspirations precautions   Chronic kidney disease, stable Baseline creatinine in the last several days has ranged between 1.6-1.7.  Creatinine in March of this year 2.26 -Trend renal function -Monitor urine output -Avoid nephrotoxins  Type 2 diabetes Continue SSI CBG checks every 4 hours CBG goal 140-1 80  Stage II decubitus ulcer,not POA  Pressure alleviating devices Local wound care  Dysphagia Plan for PEG cancelled as  family considering transition to comfort care per 11/21 Palliative Care note -Continue core track with tube feeds currently -Protein supplementation  Anemia due to CKD and acute illness Trend CBC Transfuse per protocol Hemoglobin goal greater than 7  Goals of care Per chart review patient's daughter met with Dr. Loleta Books and met palliative care 11/21 and expressed desires to potentially move forward with comfort care measures.  At that time PEG tube placement was deferred. Ongoing discussions with palliative care on 11/23, patient's daughter continues to struggle with making a decision regarding code status and transition to comfort although does express that patient would not want to live this way.   PCCM will continue to follow 1-2 times per week for trach care Safe for transfer out of ICU with nocturnal ventilation  Best Practice (right click and "Reselect all SmartList Selections" daily)   Diet/type: tubefeeds and NPO DVT prophylaxis: prophylactic heparin  GI prophylaxis: PPI Lines: N/A Foley:  N/A Code Status:  full code Last date of multidisciplinary goals of care discussion [ongoing [place] discussion with palliative care team and daughter regarding transitioning to comfort care    Critical care time:    Freda Jackson, MD [place] Office: [phone]   See Amion for personal pager PCCM on call pager [phone] until 7pm. Please call Elink 7p-7a. [phone]

## 2021-06-09 NOTE — Progress Notes (Signed)
Some medications discontinued by MD, Dr. Verlon Au, after a phone conversation with the daughter. Patient's code status changed to DNR. Daughter is expected to come tomorrow to discuss plan from here.   Kimberly Gross

## 2021-06-10 DIAGNOSIS — S062XAA Diffuse traumatic brain injury with loss of consciousness status unknown, initial encounter: Secondary | ICD-10-CM | POA: Diagnosis not present

## 2021-06-10 DIAGNOSIS — Z7189 Other specified counseling: Secondary | ICD-10-CM | POA: Diagnosis not present

## 2021-06-10 DIAGNOSIS — Z515 Encounter for palliative care: Secondary | ICD-10-CM | POA: Diagnosis not present

## 2021-06-10 LAB — GLUCOSE, CAPILLARY
Glucose-Capillary: 145 mg/dL — ABNORMAL HIGH (ref 70–99)
Glucose-Capillary: 158 mg/dL — ABNORMAL HIGH (ref 70–99)
Glucose-Capillary: 160 mg/dL — ABNORMAL HIGH (ref 70–99)
Glucose-Capillary: 160 mg/dL — ABNORMAL HIGH (ref 70–99)
Glucose-Capillary: 161 mg/dL — ABNORMAL HIGH (ref 70–99)
Glucose-Capillary: 164 mg/dL — ABNORMAL HIGH (ref 70–99)
Glucose-Capillary: 182 mg/dL — ABNORMAL HIGH (ref 70–99)

## 2021-06-10 LAB — VANCOMYCIN, TROUGH: Vancomycin Tr: 20 ug/mL (ref 15–20)

## 2021-06-10 LAB — VANCOMYCIN, PEAK: Vancomycin Pk: 51 ug/mL (ref 30–40)

## 2021-06-10 MED ORDER — VANCOMYCIN HCL 1000 MG/200ML IV SOLN
1000.0000 mg | INTRAVENOUS | Status: AC
  Administered 2021-06-12: 1000 mg via INTRAVENOUS

## 2021-06-10 MED ORDER — DEXTROSE 50 % IV SOLN
INTRAVENOUS | Status: AC
  Filled 2021-06-10: qty 50

## 2021-06-10 NOTE — Progress Notes (Signed)
Pharmacy Antibiotic Note  Kimberly Gross is a 70 y.o. female  with MRSE bacteremia.  Pharmacy has been consulted for vancomycin dosing.  Vanc peak - 51 Vanc trough - 20 On 1500 mg IV q48 hr AUC - 800  Change to Vancomycin 1000 mg IV q48hr  Plan: Decrease Vancomycin to 1000mg  IV q48hr (estimated AUC= 533) -Will follow renal function, vancomycin levels, cultures and clinical progress  Height: 5\' 3"  (160 cm) Weight: 114 kg (251 lb 5.2 oz) IBW/kg (Calculated) : 52.4  Temp (24hrs), Avg:98.9 F (37.2 C), Min:98.6 F (37 C), Max:99.1 F (37.3 C)  Recent Labs  Lab 06/05/21 0328 06/05/21 1254 06/05/21 1536 06/06/21 0147 06/07/21 0148 06/08/21 0230 06/09/21 0821 06/10/21 1718 06/10/21 2038  WBC  --  13.5*  --  11.7* 11.2* 12.1* 11.7*  --   --   CREATININE 1.71*  --   --  1.61* 1.64* 1.51* 1.46*  --   --   LATICACIDVEN  --  1.6 1.4  --   --   --   --   --   --   VANCOTROUGH  --   --   --   --   --   --   --  20  --   VANCOPEAK  --   --   --   --   --   --   --   --  51*     Estimated Creatinine Clearance: 43.6 mL/min (A) (by C-G formula based on SCr of 1.46 mg/dL (H)).    Allergies  Allergen Reactions   Chlorpromazine Rash    Thank you for allowing pharmacy to be a part of this patient's care.  Alanda Slim, PharmD, [facility] Clinical Pharmacist Please see AMION for all Pharmacists' Contact Phone Numbers 06/10/2021, 10:15 PM

## 2021-06-10 NOTE — Progress Notes (Signed)
SLP Cancellation Note  Patient Details Name: ELLIANA BAL MRN: [id number] DOB: Nov 30, 1950   Cancelled treatment:       Reason Eval/Treat Not Completed: Patient not medically ready. Pt back in ICU, on vent, but likely to go back on ATC later today. However, pt still minimally responsive, has not been successful with PMSV trials over the past week. Further SLP interventions not appropriate at this time, will sign off. Please reorder if status changes.    Sharunda Salmon, Katherene Ponto 06/10/2021, 9:30 AM

## 2021-06-10 NOTE — Progress Notes (Signed)
Palliative:  HPI: 70 yo female with past medical history of CKD stage IV, HTN, OSA on CPAP, diabetes, bipolar disorder admitted 05/13/21 with altered mental status and found to be hypoglycemic but no improvement after glucagon or Narcan. Required intubation. Unfortunately no improvement in mental status and concern for hypoglycemic brain injury. Trach placed 05/29/21. 06/03/21 conversation with daughter Kimberly Gross - considering transition to comfort. 06/05/21 return to ICU for ventilator support and no decisions made for comfort. 06/09/21 DNR decided.   I attempted to reach daughter Kimberly Gross but no answer and voicemail full. I did send SMS text to palliative contact number [phone]. I did not receive call back but was able to reach [facility] when I called her back later in the day. Kimberly Gross shares quickly that she is busy and not a place to be able to talk at the moment. I attempted to set a time to meet in person (she shares she plans to come visit this evening) today or tomorrow but she is unable to give a time. She does confirm that she is slowly coming around to the idea of comfort care but we were unable to discuss what this means and looks like to make any plans due to her time constraint.   Kimberly Gross has shared with me previously that she knows that she has to "let her mom go" and that her mother want not want to live like this. She does desire comfort care but very much struggling to make any firm decisions. Given Kimberly Gross I do not feel that going home is a realistic option. I believe that transition to comfort care with avoiding further ventilator support and removal of Cortrak to allow for natural death and symptom management to ensure comfort at end of life would be the best option for Kimberly Gross.   All questions/concerns addressed. Emotional support provided.   I have asked my colleague [name], NP to try and catch Kimberly Gross at bedside to better continue goals of care conversations.   Plan:   - Agree with DNR.  - Ongoing goals of care conversations remain challenging.   15 min  Vinie Sill, NP Palliative Medicine Team Pager [phone] (Please see [email or web address] for schedule) Team Phone [phone]    Greater than 50%  of this time was spent counseling and coordinating care related to the above assessment and plan

## 2021-06-10 NOTE — Progress Notes (Signed)
PROGRESS NOTE   Kimberly Gross  YBW:[id number] DOB: [birth year] DOA: 05/13/2021 PCP: [name], FNP  Brief Narrative:  70 year old lives with family morbidly obese black female BMI 44 HTN, bipolar, DM TY 2 with nephropathy and CKD 4, OSA on CPAP Patient found obtunded 10/31 2022-CBG was 30 improved with glucagon slightly to 75 but mentation did not GCS was 3 ABG 7.3 patient was intubated  10/31 Obtunded and hypoglycemic for EMS, mentation did not improve with glucagon. CT head unremarkable.  Intubated and amitted to ICU; started on Unasyn for aspiration pneumonia 11/1 LP normal 11/2 Seizures noted on EEG, started Keppra 11/3 LTM EEG reviewed, correlating with metabolic encephalopathy rather than epileptiform activity 11/4-11/15 No improvement in neurological function 11/16 Trach placed 11/19 Transferred to [facility] 11/21 In preparation for PEG placement and transfer to long term care, goals of care discussion with daughter, TRH and Palliative care; Daughter feels that patient would not have wanted to continue in current state, PEG on hold, daughter will discuss with family, may pursue Hospice 11/22 Fever again --> Blood cultures with MRSE; started vancomycin, ID consulted 11/23 Respiratory distress with tachypnea.  clinically patient was worseningmove the patient to ICU for full ventilatory support. 11/27 transfer back to hospitalist service      Hospital-Problem based course  MRSE PNA, Blood cult 11/20 +, ? PIV vs Decubiti Vancomycin x 10 d ending 11/30 Considered rpt CXR but is now DNR Leukocytosis persistent--no further work up--stop labs AKI superimposed on CKD 2 Very elevated BUN.  Increase Free water to 200 q4 D/c HCTZ Tachybrady syndrome Predominantly PVC on monitors-check magnesium next labs Cont Coreg 25 bid Keep on monitors for now Diarrhea Neuroglycopenia causing Persistent Vegetative state patient has made poor progress in 25 odd days Family indicates no wishes for  PEG  Diarrhea is likely secondary to feeds and we would not work-up further with infectious work-up chronically chronically Trach 2/2 inability to protect airway  PEr CCM, Trach collar during day, Vent at night Dm ty ii Hold SSI as well as Semglee 8 U  DVT prophylaxis:  Code Status: DNR decided upon 11/27 Family Communication: called [name] [phone]-patient's daughter understands lack of progression and implications of this and she has decided on DNR status-she is interested in comfort care but has not been able to coordinate with [place] when to transition to full comfort including removal of ventilatory Received a message from palliative care nurse practitioner with regards to daughter understanding implications of the same but it seems daughter cannot coordinate timings to meet-we will give a grace period of another 24 hours to family I did call her today 11/28 but did not reach her-we will decide on removal of ventilator in 24 to 48 hours Disposition:  Status is: Inpatient  Remains inpatient appropriate because: High risk patient       Consultants:  Critical care  Procedures: Multiple  Antimicrobials: Vancomycin   Subjective:  Little bit more responsive opens eyes however Babinski is upgoing and she does not have any meaningful interaction with me  Objective: Vitals:   06/10/21 1245 06/10/21 1300 06/10/21 1540 06/10/21 1607  BP:  (!) 156/71  (!) 153/65  Pulse: 78 78  77  Resp: (!) 24 (!) 25    Temp:   99.1 F (37.3 C)   TempSrc:   Oral   SpO2: 97% 95%    Weight:      Height:        Intake/Output Summary (Last 24 hours) at 06/10/2021 1609  Last data filed at 06/10/2021 1500 Gross per 24 hour  Intake 1400 ml  Output 1700 ml  Net -300 ml    Filed Weights   06/08/21 0500 06/09/21 0500 06/10/21 0500  Weight: 114.4 kg 113.7 kg 114 kg    Examination:  Chronic ill-appearing black female-nonresponsive GCS about 3-5 Chest clear no rales rhonchi but poor exam NG  tube in place Abdomen obese nontender no rebound Trace lower extremity edema Does not follow commands for neurological exam Babinski upgoing  Data Reviewed: personally reviewed   CBC    Component Value Date/Time   WBC 11.7 (H) 06/09/2021 0821   RBC 2.52 (L) 06/09/2021 0821   HGB 7.1 (L) 06/09/2021 0821   HGB 11.7 09/12/2020 1152   HCT 23.0 (L) 06/09/2021 0821   HCT 36.9 09/12/2020 1152   PLT 378 06/09/2021 0821   PLT 298 09/12/2020 1152   MCV 91.3 06/09/2021 0821   MCV 91 09/12/2020 1152   MCH 28.2 06/09/2021 0821   MCHC 30.9 06/09/2021 0821   RDW 12.7 06/09/2021 0821   RDW 11.8 09/12/2020 1152   LYMPHSABS 2.8 06/09/2021 0821   MONOABS 1.1 (H) 06/09/2021 0821   EOSABS 0.9 (H) 06/09/2021 0821   BASOSABS 0.1 06/09/2021 0821   CMP Latest Ref Rng & Units 06/09/2021 06/08/2021 06/07/2021  Glucose 70 - 99 mg/dL 203(H) 164(H) 166(H)  BUN 8 - 23 mg/dL 112(H) 119(H) 124(H)  Creatinine 0.44 - 1.00 mg/dL 1.46(H) 1.51(H) 1.64(H)  Sodium 135 - 145 mmol/L 136 135 136  Potassium 3.5 - 5.1 mmol/L 4.2 4.2 4.6  Chloride 98 - 111 mmol/L 104 102 103  CO2 22 - 32 mmol/L 21(L) 23 22  Calcium 8.9 - 10.3 mg/dL 8.8(L) 9.0 9.0  Total Protein 6.5 - 8.1 g/dL 5.7(L) - -  Total Bilirubin 0.3 - 1.2 mg/dL 0.5 - -  Alkaline Phos 38 - 126 U/L 215(H) - -  AST 15 - 41 U/L 35 - -  ALT 0 - 44 U/L 36 - -     Radiology Studies: No results found.   Scheduled Meds:  amLODipine  10 mg Per Tube Daily   carvedilol  25 mg Per Tube BID WC   chlorhexidine gluconate (MEDLINE KIT)  15 mL Mouth Rinse BID   Chlorhexidine Gluconate Cloth  6 each Topical Daily   dextrose       enoxaparin (LOVENOX) injection  60 mg Subcutaneous Q24H   feeding supplement (PROSource TF)  45 mL Per Tube TID   fentaNYL (SUBLIMAZE) injection  25 mcg Intravenous Once   fiber  2 packet Per Tube TID   free water  200 mL Per Tube Q4H   Gerhardt's butt cream   Topical BID   levETIRAcetam  500 mg Per Tube BID   mouth rinse  15 mL Mouth  Rinse 10 times per day   pantoprazole sodium  40 mg Per Tube QHS   Continuous Infusions:  sodium chloride 10 mL/hr at 06/04/21 1648   feeding supplement (JEVITY 1.5 CAL/FIBER) 1,000 mL (06/10/21 1505)   vancomycin Stopped (06/08/21 2003)     LOS: 28 days   Time spent: [place], MD [facility] To contact the attending provider between 7A-7P or the covering provider during after hours 7P-7A, please log into the web site [email or web address] and access using universal [place] password for that web site. If you do not have the password, please call the hospital operator.  06/10/2021, 4:09 PM

## 2021-06-11 DIAGNOSIS — Z515 Encounter for palliative care: Secondary | ICD-10-CM | POA: Diagnosis not present

## 2021-06-11 DIAGNOSIS — N184 Chronic kidney disease, stage 4 (severe): Secondary | ICD-10-CM | POA: Diagnosis not present

## 2021-06-11 DIAGNOSIS — J9601 Acute respiratory failure with hypoxia: Secondary | ICD-10-CM | POA: Diagnosis not present

## 2021-06-11 DIAGNOSIS — S062XAA Diffuse traumatic brain injury with loss of consciousness status unknown, initial encounter: Secondary | ICD-10-CM | POA: Diagnosis not present

## 2021-06-11 LAB — GLUCOSE, CAPILLARY
Glucose-Capillary: 139 mg/dL — ABNORMAL HIGH (ref 70–99)
Glucose-Capillary: 144 mg/dL — ABNORMAL HIGH (ref 70–99)
Glucose-Capillary: 157 mg/dL — ABNORMAL HIGH (ref 70–99)
Glucose-Capillary: 160 mg/dL — ABNORMAL HIGH (ref 70–99)
Glucose-Capillary: 138 mg/dL — ABNORMAL HIGH (ref 70–99)
Glucose-Capillary: 129 mg/dL — ABNORMAL HIGH (ref 70–99)

## 2021-06-11 MED ORDER — JEVITY 1.5 CAL/FIBER PO LIQD
1000.0000 mL | ORAL | Status: DC
Start: 2021-06-11 — End: 2021-06-13
  Administered 2021-06-13: 1000 mL
  Filled 2021-06-11 (×2): qty 1000

## 2021-06-11 NOTE — Progress Notes (Signed)
Patient ID: Kimberly Gross, female   DOB: 02-Aug-1950, 70 y.o.   MRN: [id number]    Progress Note from the Palliative Medicine Team at [facility]   Patient Name: Kimberly Gross        Date: 06/11/2021 DOB: July 19, 1950  Age: 70 y.o. MRN#: [id number] Attending Physician: Nita Sells, MD Primary Care Physician: [name], FNP Admit Date: 05/13/2021   Medical records reviewed /  HPI  70 yo female with past medical history of CKD stage IV, HTN, OSA on CPAP, diabetes, bipolar disorder admitted 05/13/21 with altered mental status and found to be hypoglycemic but no improvement after glucagon or Narcan. Required intubation. Unfortunately no improvement in mental status and concern for hypoglycemic brain injury. Trach placed 05/29/21. 06/03/21 conversation with daughter Kimberly Gross - considering transition to comfort. 06/05/21 return to ICU for ventilator support and no decisions made for comfort. 06/09/21 DNR decided.  Patient continues to require vent support at night.  Long term poor prognosis for meaningful recovery. Daughter faces difficult decisions regarding continued life prolong measures vs  less medical measures and allowing for a natural death.   Today's discussion:   This NP met with daughter Kimberly Gross at patient's bedside as scheduled for continued conversation regarding current medical situation and treatment plan.  Education offered again on the seriousness of the current medical situation and patient's high risk to decompensate secondary to brain injury and long term poor prognosis.  Daughter is able to verbalize that her mother would not want to live long tem in her current state.  The difference between aggressive medical intervention path and a palliative comfort path for this patient at this time in this situation was detailed.  I detailed the logistics of shifting to a comfort path and measures we can utilize to enhance her comfort.   Education on residential  hospice benefit and eligibility offered.  Education offered on the natural trajectory and expectations at end-of-life  Daughter understands the seriousness of the current medical situation and the associated poor prognosis however at this time she is unable to make a full shift to comfort care allowing for natural death.  Speaks to the death of her father in  09/24/18 and "how hard this is to do again".  Emotional support offered.  Created space for her as she  shared her love and appreciation for both her parents.  Kimberly Gross today talked about funeral services for her mother, she is working through her grief for anticipated death of her mother.  Plan: - DNR/DNI- previously documented -no escalation of care, and begin to "slowly" de-escalate care.  Daughter questions "Can we begin to take things away slowly?"       - will decrease TF rate to 25 cc/hr as a measure to help daughter shift gradually -Daughter agrees to meet with me again on 06/13/2021 at 12:00 to further delineate treatment plan  -PMT will continue to support holisticall  Questions and concerns addressed   Discussed with Dr Verlon Au vis secure chat and Silva Bandy RN  Total time spent on the unit was 60 minutes  Greater than 50% of the time was spent in counseling and coordination of care  [name] NP  Palliative Medicine Team Team Phone # [phone] Pager [phone]

## 2021-06-11 NOTE — Progress Notes (Signed)
Critical result. Vanc [place] 51. Pharmacy consulted.

## 2021-06-11 NOTE — Progress Notes (Signed)
PROGRESS NOTE   Kimberly Gross  IPP:[id number] DOB: 1950-08-17 DOA: 05/13/2021 PCP: [name], FNP  Brief Narrative:  70 year old lives with family morbidly obese black female BMI 44 HTN, bipolar, DM TY 2 with nephropathy and CKD 4, OSA on CPAP Patient found obtunded 10/31 2022-CBG was 30 improved with glucagon slightly to 75 but mentation did not GCS was 3 ABG 7.3 patient was intubated  10/31 Obtunded and hypoglycemic for EMS, mentation did not improve with glucagon. CT head unremarkable.  Intubated and amitted to ICU; started on Unasyn for aspiration pneumonia 11/1 LP normal 11/2 Seizures noted on EEG, started Keppra 11/3 LTM EEG reviewed, correlating with metabolic encephalopathy rather than epileptiform activity 11/4-11/15 No improvement in neurological function 11/16 Trach placed 11/19 Transferred to [facility] 11/21 In preparation for PEG placement and transfer to long term care, goals of care discussion with daughter, TRH and Palliative care; Daughter feels that patient would not have wanted to continue in current state, PEG on hold, daughter will discuss with family, may pursue Hospice 11/22 Fever again --> Blood cultures with MRSE; started vancomycin, ID consulted 11/23 Respiratory distress with tachypnea.  clinically patient was worseningmove the patient to ICU for full ventilatory support. 11/27 transfer back to hospitalist service 11/29 goals of care in progress      Hospital-Problem based course  MRSE PNA, Blood cult 11/20 +, ? PIV vs Decubiti Vancomycin x 10 d ending 11/30 Leukocytosis persistent low-grade fevers noted hold further work-up debilitated GCS about 4 free water back to every 6 AKI superimposed on CKD 2 Very elevated BUN.  Stop checking labs Free water back to every 6 D/c HCTZ Tachybrady syndrome Predominantly PVC on monitors-check magnesium next labs Cont Coreg 25 bid Keep on monitors Diarrhea Neuroglycopenia causing Persistent Vegetative  state Unclear family wishes but patient has made poor progress in 25 odd days Feeds have been cut back by palliative care to have given her diarrhea Family undecided about PEG and await further clarification from them re: next steps Trach 2/2 inability to protect airway  PEr CCM, Trach collar during day, Vent at night Dm ty ii Hold SSI as well as Semglee 8 U  DVT prophylaxis:  Code Status: DNR decided upon 11/27 Family Communication: called [name] [phone] on 11/27 palliative care will reengage with patient on-patient's daughter understands lack of progression and implications of this and she has decided on DNR status We will continue supportive management in terms of feeds and fluids but we will probably withdraw the same in the next several days based on discussion with family Palliative care to reengage with patient on 12/1--- likely withdrawal of care later this week Disposition:  Status is: Inpatient  Remains inpatient appropriate because: High risk patient      Consultants:  Critical care  Procedures: Multiple  Antimicrobials: Vancomycin   Subjective:  No meaningful changes on exam Still having diarrhea Nursing asks if we can cut back fluids in terms of free water   Objective: Vitals:   06/11/21 1142 06/11/21 1200 06/11/21 1300 06/11/21 1514  BP:  (!) 142/62    Pulse: 75 74 74   Resp: 20 (!) 23 (!) 21   Temp:    99.4 F (37.4 C)  TempSrc:    Axillary  SpO2: 95% 97% 96%   Weight:      Height:        Intake/Output Summary (Last 24 hours) at 06/11/2021 1546 Last data filed at 06/11/2021 1300 Gross per 24 hour  Intake 2060.1  ml  Output 2400 ml  Net -339.9 ml    Filed Weights   06/09/21 0500 06/10/21 0500 06/11/21 0500  Weight: 113.7 kg 114 kg 115.5 kg    Examination:  No meaningful movements debilitated GCS 4 Thick neck Mallampati 4 Core track in place Abdomen soft obese nontender no rebound no guarding no lower extremity  edema all  Data  Reviewed: personally reviewed   CBC    Component Value Date/Time   WBC 11.7 (H) 06/09/2021 0821   RBC 2.52 (L) 06/09/2021 0821   HGB 7.1 (L) 06/09/2021 0821   HGB 11.7 09/12/2020 1152   HCT 23.0 (L) 06/09/2021 0821   HCT 36.9 09/12/2020 1152   PLT 378 06/09/2021 0821   PLT 298 09/12/2020 1152   MCV 91.3 06/09/2021 0821   MCV 91 09/12/2020 1152   MCH 28.2 06/09/2021 0821   MCHC 30.9 06/09/2021 0821   RDW 12.7 06/09/2021 0821   RDW 11.8 09/12/2020 1152   LYMPHSABS 2.8 06/09/2021 0821   MONOABS 1.1 (H) 06/09/2021 0821   EOSABS 0.9 (H) 06/09/2021 0821   BASOSABS 0.1 06/09/2021 0821   CMP Latest Ref Rng & Units 06/09/2021 06/08/2021 06/07/2021  Glucose 70 - 99 mg/dL 203(H) 164(H) 166(H)  BUN 8 - 23 mg/dL 112(H) 119(H) 124(H)  Creatinine 0.44 - 1.00 mg/dL 1.46(H) 1.51(H) 1.64(H)  Sodium 135 - 145 mmol/L 136 135 136  Potassium 3.5 - 5.1 mmol/L 4.2 4.2 4.6  Chloride 98 - 111 mmol/L 104 102 103  CO2 22 - 32 mmol/L 21(L) 23 22  Calcium 8.9 - 10.3 mg/dL 8.8(L) 9.0 9.0  Total Protein 6.5 - 8.1 g/dL 5.7(L) - -  Total Bilirubin 0.3 - 1.2 mg/dL 0.5 - -  Alkaline Phos 38 - 126 U/L 215(H) - -  AST 15 - 41 U/L 35 - -  ALT 0 - 44 U/L 36 - -     Radiology Studies: No results found.   Scheduled Meds:  amLODipine  10 mg Per Tube Daily   carvedilol  25 mg Per Tube BID WC   chlorhexidine gluconate (MEDLINE KIT)  15 mL Mouth Rinse BID   Chlorhexidine Gluconate Cloth  6 each Topical Daily   enoxaparin (LOVENOX) injection  60 mg Subcutaneous Q24H   feeding supplement (PROSource TF)  45 mL Per Tube TID   fiber  2 packet Per Tube TID   free water  200 mL Per Tube Q4H   Gerhardt's butt cream   Topical BID   levETIRAcetam  500 mg Per Tube BID   mouth rinse  15 mL Mouth Rinse 10 times per day   pantoprazole sodium  40 mg Per Tube QHS   Continuous Infusions:  sodium chloride 10 mL/hr at 06/04/21 1648   feeding supplement (JEVITY 1.5 CAL/FIBER)     [START ON 06/12/2021] vancomycin        LOS: 29 days   Time spent: 25  Nita Sells, MD [facility] To contact the attending provider between 7A-7P or the covering provider during after hours 7P-7A, please log into the web site [email or web address] and access using universal [place] password for that web site. If you do not have the password, please call the hospital operator.  06/11/2021, 3:46 PM

## 2021-06-12 DIAGNOSIS — S062XAA Diffuse traumatic brain injury with loss of consciousness status unknown, initial encounter: Secondary | ICD-10-CM | POA: Diagnosis not present

## 2021-06-12 LAB — CULTURE, BLOOD (ROUTINE X 2)
Culture: NO GROWTH
Culture: NO GROWTH
Special Requests: ADEQUATE
Special Requests: ADEQUATE

## 2021-06-12 LAB — GLUCOSE, CAPILLARY
Glucose-Capillary: 140 mg/dL — ABNORMAL HIGH (ref 70–99)
Glucose-Capillary: 137 mg/dL — ABNORMAL HIGH (ref 70–99)
Glucose-Capillary: 144 mg/dL — ABNORMAL HIGH (ref 70–99)
Glucose-Capillary: 157 mg/dL — ABNORMAL HIGH (ref 70–99)
Glucose-Capillary: 139 mg/dL — ABNORMAL HIGH (ref 70–99)
Glucose-Capillary: 124 mg/dL — ABNORMAL HIGH (ref 70–99)

## 2021-06-12 MED ORDER — IPRATROPIUM-ALBUTEROL 0.5-2.5 (3) MG/3ML IN SOLN
3.0000 mL | Freq: Three times a day (TID) | RESPIRATORY_TRACT | Status: DC
  Administered 2021-06-12: 3 mL via RESPIRATORY_TRACT
  Filled 2021-06-12: qty 3

## 2021-06-12 NOTE — Progress Notes (Signed)
PROGRESS NOTE   Kimberly Gross  YSA:[id number] DOB: 1950/09/24 DOA: 05/13/2021 PCP: [name], FNP  Brief Narrative:  70 year old lives with family morbidly obese black female BMI 44 HTN, bipolar, DM TY 2 with nephropathy and CKD 4, OSA on CPAP. Patient found obtunded 10/31 2022-CBG was 30 improved with glucagon slightly to 75 but mentation did not GCS was 3 ABG 7.3 patient was intubated  10/31 Obtunded and hypoglycemic for EMS, mentation did not improve with glucagon. CT head unremarkable.  Intubated and amitted to ICU; started on Unasyn for aspiration pneumonia 11/1 LP normal 11/2 Seizures noted on EEG, started Keppra 11/3 LTM EEG reviewed, correlating with metabolic encephalopathy rather than epileptiform activity 11/4-11/15 No improvement in neurological function 11/16 Trach placed 11/19 Transferred to [facility] 11/21 In preparation for PEG placement and transfer to long term care, goals of care discussion with daughter, TRH and Palliative care; Daughter feels that patient would not have wanted to continue in current state, PEG on hold, daughter will discuss with family, may pursue Hospice 11/22 Fever again --> Blood cultures with MRSE; started vancomycin, ID consulted 11/23 Respiratory distress with tachypnea.  clinically patient was worseningmove the patient to ICU for full ventilatory support. 11/27 transfer back to hospitalist service 11/29 goals of care in progress and ongoing      Hospital-Problem based course  MRSE PNA/Bacteremia, POA - Vancomycin completed 11/30 -Repeat cultures negative x5 days  AKI superimposed on CKD 2, stable DC Hctz - no longer following labs per previous discussion with family  Tachybrady syndrome Predominantly PVCs per monitor Cont Coreg 25 bid  Diarrhea In the setting of tube feeds and antibiotics - follow for infectious process  Persistent Vegetative state Unclear family wishes but patient has made poor progress in 25 odd days Feeds have  been cut back by palliative care given her diarrhea Family undecided about PEG and await further clarification for goals of care  Trach 2/2 inability to protect airway Secondary to respiratory failure Trach placed 05/29/21 - Management per PCCM - Trach collar during day, Vent at night  Type 2 DM, stable Hold SSI as well as Semglee 8 U  DVT prophylaxis:  Code Status: DNR decided upon 11/27 Family Communication: No answer today; Palliative care to reengage with patient on 12/1--- likely withdrawal of care later this week  Disposition:  Status is: Inpatient Remains inpatient appropriate because: High risk patient  Consultants:  Critical care  Procedures: Multiple  Antimicrobials: Vancomycin   Subjective: No acute issues/events overnight   Objective: Vitals:   06/12/21 0338 06/12/21 0346 06/12/21 0400 06/12/21 0448  BP: 139/77     Pulse: 74  74   Resp: 19  (!) 23   Temp:  99.6 F (37.6 C)    TempSrc:  Axillary    SpO2: 96%  97%   Weight:    115.9 kg  Height:        Intake/Output Summary (Last 24 hours) at 06/12/2021 0725 Last data filed at 06/12/2021 0400 Gross per 24 hour  Intake 1240 ml  Output 1700 ml  Net -460 ml    Filed Weights   06/10/21 0500 06/11/21 0500 06/12/21 0448  Weight: 114 kg 115.5 kg 115.9 kg    Examination:  No meaningful movements debilitated GCS 4 Thick neck Mallampati 4 Core track in place Abdomen soft obese nontender no rebound no guarding no lower extremity  edema all  Data Reviewed: personally reviewed   CBC    Component Value Date/Time   WBC  11.7 (H) 06/09/2021 0821   RBC 2.52 (L) 06/09/2021 0821   HGB 7.1 (L) 06/09/2021 0821   HGB 11.7 09/12/2020 1152   HCT 23.0 (L) 06/09/2021 0821   HCT 36.9 09/12/2020 1152   PLT 378 06/09/2021 0821   PLT 298 09/12/2020 1152   MCV 91.3 06/09/2021 0821   MCV 91 09/12/2020 1152   MCH 28.2 06/09/2021 0821   MCHC 30.9 06/09/2021 0821   RDW 12.7 06/09/2021 0821   RDW 11.8 09/12/2020  1152   LYMPHSABS 2.8 06/09/2021 0821   MONOABS 1.1 (H) 06/09/2021 0821   EOSABS 0.9 (H) 06/09/2021 0821   BASOSABS 0.1 06/09/2021 0821   CMP Latest Ref Rng & Units 06/09/2021 06/08/2021 06/07/2021  Glucose 70 - 99 mg/dL 203(H) 164(H) 166(H)  BUN 8 - 23 mg/dL 112(H) 119(H) 124(H)  Creatinine 0.44 - 1.00 mg/dL 1.46(H) 1.51(H) 1.64(H)  Sodium 135 - 145 mmol/L 136 135 136  Potassium 3.5 - 5.1 mmol/L 4.2 4.2 4.6  Chloride 98 - 111 mmol/L 104 102 103  CO2 22 - 32 mmol/L 21(L) 23 22  Calcium 8.9 - 10.3 mg/dL 8.8(L) 9.0 9.0  Total Protein 6.5 - 8.1 g/dL 5.7(L) - -  Total Bilirubin 0.3 - 1.2 mg/dL 0.5 - -  Alkaline Phos 38 - 126 U/L 215(H) - -  AST 15 - 41 U/L 35 - -  ALT 0 - 44 U/L 36 - -     Radiology Studies: No results found.   Scheduled Meds:  amLODipine  10 mg Per Tube Daily   carvedilol  25 mg Per Tube BID WC   chlorhexidine gluconate (MEDLINE KIT)  15 mL Mouth Rinse BID   Chlorhexidine Gluconate Cloth  6 each Topical Daily   enoxaparin (LOVENOX) injection  60 mg Subcutaneous Q24H   feeding supplement (PROSource TF)  45 mL Per Tube TID   fiber  2 packet Per Tube TID   free water  200 mL Per Tube Q4H   Gerhardt's butt cream   Topical BID   ipratropium-albuterol  3 mL Nebulization TID   levETIRAcetam  500 mg Per Tube BID   mouth rinse  15 mL Mouth Rinse 10 times per day   pantoprazole sodium  40 mg Per Tube QHS   Continuous Infusions:  sodium chloride 10 mL/hr at 06/04/21 1648   feeding supplement (JEVITY 1.5 CAL/FIBER) 25 mL/hr at 06/11/21 1604   vancomycin       LOS: 30 days   Time spent: [place], DO [facility]  06/12/2021, 7:25 AM

## 2021-06-12 NOTE — Progress Notes (Signed)
Nutrition Follow-up  DOCUMENTATION CODES:   Not applicable  INTERVENTION:   TF via Cortrak:  Jevity 1.5 at 25 ml/h. Prosource TF 45 ml TID.  Provides 1020 kcal, 71 gm protein, 456 ml free water daily.  If comfort care planned, consider d/c TF and Cortrak tube.  NUTRITION DIAGNOSIS:   Inadequate oral intake related to inability to eat as evidenced by NPO status.  Ongoing   GOAL:   Patient will meet greater than or equal to 90% of their needs  Unmet  MONITOR:   Vent status, Labs, Weight trends, TF tolerance, I & O's  REASON FOR ASSESSMENT:   Ventilator, Consult Enteral/tube feeding initiation and management  ASSESSMENT:   70 year old female who presented to the ED on 10/31 with AMS. PMH of CKD stage IV, T2DM, bipolar disorder, HTN, OSA on CPAP. Pt admitted with acute metabolic encephalopathy, acute respiratory failure with hypoxia.  Discussed patient in ICU rounds and with RN today. Plans for Palliative Care meeting 12/1 with daughter. Still deciding on goals of care. Tube feeding has been decreased to 25 ml/h by Palliative Care team to slowly de-escalate care. No escalation of care planned.   No labs since 11/27 CBG: [phone]  Diet Order:   Diet Order             Diet NPO time specified  Diet effective now                   EDUCATION NEEDS:   Not appropriate for education at this time  Skin:  Skin Assessment: Skin Integrity Issues: Skin Integrity Issues:: Other (Comment), Stage II Stage II: L buttocks, R labia, R thigh Other: Skin tears on back x2  Last BM:  11/30 type 7  Height:   Ht Readings from Last 1 Encounters:  06/08/21 5\' 3"  (1.6 m)    Weight:   Wt Readings from Last 1 Encounters:  06/12/21 115.9 kg    Ideal Body Weight:  54.5 kg  BMI:  Body mass index is 45.26 kg/m.  Estimated Nutritional Needs:   Kcal:  1700-1900  Protein:  110-130 grams  Fluid:  1.7-1.9 L    Lucas Mallow, RD, LDN, CNSC Please refer to Amion  for contact information.

## 2021-06-13 DIAGNOSIS — J9621 Acute and chronic respiratory failure with hypoxia: Secondary | ICD-10-CM

## 2021-06-13 DIAGNOSIS — Z515 Encounter for palliative care: Secondary | ICD-10-CM | POA: Diagnosis not present

## 2021-06-13 DIAGNOSIS — G9340 Encephalopathy, unspecified: Secondary | ICD-10-CM | POA: Diagnosis not present

## 2021-06-13 DIAGNOSIS — S062XAA Diffuse traumatic brain injury with loss of consciousness status unknown, initial encounter: Secondary | ICD-10-CM | POA: Diagnosis not present

## 2021-06-13 DIAGNOSIS — R52 Pain, unspecified: Secondary | ICD-10-CM

## 2021-06-13 DIAGNOSIS — R0609 Other forms of dyspnea: Secondary | ICD-10-CM

## 2021-06-13 LAB — GLUCOSE, CAPILLARY
Glucose-Capillary: 119 mg/dL — ABNORMAL HIGH (ref 70–99)
Glucose-Capillary: 141 mg/dL — ABNORMAL HIGH (ref 70–99)
Glucose-Capillary: 144 mg/dL — ABNORMAL HIGH (ref 70–99)

## 2021-06-13 MED ORDER — LORAZEPAM 2 MG/ML IJ SOLN
2.0000 mg | Freq: Four times a day (QID) | INTRAMUSCULAR | Status: DC
  Administered 2021-06-13 – 2021-06-15 (×8): 2 mg via INTRAVENOUS
  Filled 2021-06-13 (×8): qty 1

## 2021-06-13 MED ORDER — LORAZEPAM 2 MG/ML IJ SOLN: 1.0000 mg | INTRAMUSCULAR | Status: DC | PRN

## 2021-06-13 MED ORDER — MORPHINE SULFATE (PF) 2 MG/ML IV SOLN: 2.0000 mg | INTRAVENOUS | Status: DC | PRN

## 2021-06-13 NOTE — TOC Progression Note (Addendum)
Transition of Care [facility]) - Progression Note    Patient Details  Name: KARRON GOENS MRN: [id number] Date of Birth: 24-Mar-1951  Transition of Care [facility]) CM/SW [place], RN Phone Number: 06/13/2021, 3:04 PM  Clinical Narrative:    Patient transitioned to [facility] after palliative discussion. Notified [facility] hospice, as the family was deciding on whether or not to transfer to [facility].  CM will follow for needs, recommendations, and transitions [facility] will follow patient for potential [place] referral.      Barriers to Discharge: Continued Medical Work up  Expected Discharge Plan and Services   In-house Referral: Clinical Social Work Discharge Planning Services: CM Consult   Living arrangements for the past 2 months: [place]                                       Social Determinants of Health (SDOH) Interventions    Readmission Risk Interventions No flowsheet data found.

## 2021-06-13 NOTE — Progress Notes (Signed)
PROGRESS NOTE   Kimberly Gross  OVF:[id number] DOB: 11-May-1951 DOA: 05/13/2021 PCP: [name], FNP  Brief Narrative:  70 year old lives with family morbidly obese black female BMI 44 HTN, bipolar, DM TY 2 with nephropathy and CKD 4, OSA on CPAP. Patient found obtunded 10/31 2022-CBG was 30 improved with glucagon slightly to 75 but mentation did not GCS was 3 ABG 7.3 patient was intubated  10/31 Obtunded and hypoglycemic for EMS, mentation did not improve with glucagon. CT head unremarkable.  Intubated and amitted to ICU; started on Unasyn for aspiration pneumonia 11/1 LP normal 11/2 Seizures noted on EEG, started Keppra 11/3 LTM EEG reviewed, correlating with metabolic encephalopathy rather than epileptiform activity 11/4-11/15 No improvement in neurological function 11/16 Trach placed 11/19 Transferred to [facility] 11/21 In preparation for PEG placement and transfer to long term care, goals of care discussion with daughter, TRH and Palliative care; Daughter feels that patient would not have wanted to continue in current state, PEG on hold, daughter will discuss with family, may pursue Hospice 11/22 Fever again --> Blood cultures with MRSE; started vancomycin, ID consulted 11/23 Respiratory distress with tachypnea.  clinically patient was worseningmove the patient to ICU for full ventilatory support. 11/27 transfer back to hospitalist service 11/29 goals of care in progress and ongoing 12/1 - Palliative meeting with daughter and multiple family members, everyone is agreeable to transition to comfort measures with likely ultimate transition [facility].    Hospital-Problem based course  Goals of care/comfort measures  -Discontinue feeding tube IV fluids IVs telemetry, focus on comfort measures -Pain control and anxiolytics per palliative care, appreciate insight and recommendations -Will wean patient off ventilator at night, supplemental oxygen during the day along with increasing pain  and anxiolytic medications    MRSE PNA/Bacteremia, POA - Vancomycin completed 11/30 AKI superimposed on CKD 2, stable Tachybrady syndrome Persistent Vegetative state Trach 2/2 inability to protect airway Secondary to respiratory failure Type 2 DM, stable  DVT prophylaxis:  Code Status: DNR -transitioning to comfort measures Family Communication: Daughter, sister, nephew and niece at bedside today for long discussion with palliative care  Disposition:  Status is: Inpatient Discharge plan likely to peak in place in the next 24 to 48 hours  Subjective: No acute issues/events overnight   Objective: Vitals:   06/13/21 0630 06/13/21 0700 06/13/21 0728 06/13/21 0736  BP:  140/77  (!) 143/69  Pulse: 74 71 74 75  Resp: 18 (!) 21 20   Temp:    99.9 F (37.7 C)  TempSrc:    Axillary  SpO2: 96% 97% 98%   Weight:      Height:        Intake/Output Summary (Last 24 hours) at 06/13/2021 0741 Last data filed at 06/13/2021 0600 Gross per 24 hour  Intake 638.08 ml  Output 2000 ml  Net -1361.92 ml    Filed Weights   06/11/21 0500 06/12/21 0448 06/13/21 0500  Weight: 115.5 kg 115.9 kg 114.5 kg    Examination:  No meaningful movements debilitated GCS 4 Thick neck Mallampati 4 Core track in place Abdomen soft obese nontender no rebound no guarding no lower extremity edema  Data Reviewed: personally reviewed   CBC    Component Value Date/Time   WBC 11.7 (H) 06/09/2021 0821   RBC 2.52 (L) 06/09/2021 0821   HGB 7.1 (L) 06/09/2021 0821   HGB 11.7 09/12/2020 1152   HCT 23.0 (L) 06/09/2021 0821   HCT 36.9 09/12/2020 1152   PLT 378 06/09/2021  0821   PLT 298 09/12/2020 1152   MCV 91.3 06/09/2021 0821   MCV 91 09/12/2020 1152   MCH 28.2 06/09/2021 0821   MCHC 30.9 06/09/2021 0821   RDW 12.7 06/09/2021 0821   RDW 11.8 09/12/2020 1152   LYMPHSABS 2.8 06/09/2021 0821   MONOABS 1.1 (H) 06/09/2021 0821   EOSABS 0.9 (H) 06/09/2021 0821   BASOSABS 0.1 06/09/2021 0821   CMP  Latest Ref Rng & Units 06/09/2021 06/08/2021 06/07/2021  Glucose 70 - 99 mg/dL 203(H) 164(H) 166(H)  BUN 8 - 23 mg/dL 112(H) 119(H) 124(H)  Creatinine 0.44 - 1.00 mg/dL 1.46(H) 1.51(H) 1.64(H)  Sodium 135 - 145 mmol/L 136 135 136  Potassium 3.5 - 5.1 mmol/L 4.2 4.2 4.6  Chloride 98 - 111 mmol/L 104 102 103  CO2 22 - 32 mmol/L 21(L) 23 22  Calcium 8.9 - 10.3 mg/dL 8.8(L) 9.0 9.0  Total Protein 6.5 - 8.1 g/dL 5.7(L) - -  Total Bilirubin 0.3 - 1.2 mg/dL 0.5 - -  Alkaline Phos 38 - 126 U/L 215(H) - -  AST 15 - 41 U/L 35 - -  ALT 0 - 44 U/L 36 - -     Radiology Studies: No results found.   Scheduled Meds:  amLODipine  10 mg Per Tube Daily   carvedilol  25 mg Per Tube BID WC   chlorhexidine gluconate (MEDLINE KIT)  15 mL Mouth Rinse BID   Chlorhexidine Gluconate Cloth  6 each Topical Daily   enoxaparin (LOVENOX) injection  60 mg Subcutaneous Q24H   feeding supplement (PROSource TF)  45 mL Per Tube TID   fiber  2 packet Per Tube TID   free water  200 mL Per Tube Q4H   Gerhardt's butt cream   Topical BID   levETIRAcetam  500 mg Per Tube BID   mouth rinse  15 mL Mouth Rinse 10 times per day   pantoprazole sodium  40 mg Per Tube QHS   Continuous Infusions:  sodium chloride 10 mL/hr at 06/04/21 1648   feeding supplement (JEVITY 1.5 CAL/FIBER) 1,000 mL (06/13/21 0655)     LOS: 31 days   Time spent: [place], DO [facility]  06/13/2021, 7:41 AM

## 2021-06-13 NOTE — Progress Notes (Signed)
Patient ID: Kimberly Gross, female   DOB: October 29, 1950, 70 y.o.   MRN: [id number]    Progress Note from the Palliative Medicine Team at [facility]   Patient Name: Kimberly Gross        Date: 06/13/2021 DOB: [birth year]  Age: 70 y.o. MRN#: [id number] Attending Physician: Little Ishikawa, MD Primary Care Physician: [name], FNP Admit Date: 05/13/2021   Medical records reviewed /  HPI  70 yo female with past medical history of CKD stage IV, HTN, OSA on CPAP, diabetes, bipolar disorder admitted 05/13/21 with altered mental status and found to be hypoglycemic but no improvement after glucagon or Narcan. Required intubation. Unfortunately no improvement in mental status and concern for hypoglycemic brain injury. Trach placed 05/29/21. 06/03/21 conversation with daughter Kimberly Gross - considering transition to comfort. 06/05/21 return to ICU for ventilator support and no decisions made for comfort. 06/09/21 DNR decided.  Patient continues to require vent support at night.  Long term poor prognosis for meaningful recovery.  Daughter faces difficult decisions regarding continued life prolong measures vs  less medical measures and allowing for a natural death.  Dr. Avon Gully updated family on current medical situation  Today's discussion:   I met today as scheduled with patient's daughter Kimberly Gross, patient's sister-in-law Kimberly Gross and patient's nephew and his wife Kimberly Gross and Kimberly Gross. For continued conversation regarding current medical situation, treatment option decisions, Advanced directive decisions and anticipatory care needs.  Education offered again on the seriousness of the current medical situation and patient's high risk to decompensate secondary to brain injury and long term poor prognosis for any meaningful recovery.  All family members present verbalize a clear understanding that patient would not want to live long tem in her current state.  The difference between  aggressive medical intervention path and a palliative comfort path for this patient at this time in this situation was detailed.   I detailed the logistics of shifting to a comfort path and interventions we can utilize to enhance her comfort.     Education on residential hospice benefit and eligibility offered.  Education offered on the natural trajectory and expectations at end-of-life.  Questions and concerns addressed.  Emotional support offered.   Plan:    Focus of care is comfort and dignity - DNR/DNI- previously documented - No vent support even at night  - No artifical feeding or hydration now or in the future/ dc tube feeds and free water - No further labs, diagnostics or life prolonging measures - Foley cath for end-of-life care -symptom management        -Morphine 2 mg IV every 2 hours as needed for pain or dyspnea        -Ativan 2 mg IV scheduled every 6 hours, d/c Keppra        -Ativan 1 mg IV every 4 hours as needed -Liberalize visitation, may transfer out of the ICU - family is "thinking aboutWarehouse manager-- prognosis is less than 2 weeks  - PMT will continue to support holistically   Discussed with Dr Avon Gully and bedside RN/Jose  Total time spent on the unit was 65 minutes  Greater than 50% of the time was spent in counseling and coordination of care  [name] NP  Palliative Medicine Team Team Phone # [phone] Pager [phone]

## 2021-06-14 DIAGNOSIS — Z515 Encounter for palliative care: Secondary | ICD-10-CM | POA: Diagnosis not present

## 2021-06-14 DIAGNOSIS — S062XAA Diffuse traumatic brain injury with loss of consciousness status unknown, initial encounter: Secondary | ICD-10-CM | POA: Diagnosis not present

## 2021-06-14 DIAGNOSIS — K117 Disturbances of salivary secretion: Secondary | ICD-10-CM

## 2021-06-14 DIAGNOSIS — R4182 Altered mental status, unspecified: Secondary | ICD-10-CM | POA: Diagnosis not present

## 2021-06-14 MED ORDER — GLYCOPYRROLATE 0.2 MG/ML IJ SOLN: 0.4000 mg | Freq: Four times a day (QID) | INTRAMUSCULAR | Status: DC | PRN

## 2021-06-14 MED ORDER — MORPHINE SULFATE (PF) 2 MG/ML IV SOLN
1.0000 mg | Freq: Four times a day (QID) | INTRAVENOUS | Status: DC
  Administered 2021-06-14 – 2021-06-15 (×5): 1 mg via INTRAVENOUS
  Filled 2021-06-14 (×5): qty 1

## 2021-06-14 NOTE — TOC Progression Note (Signed)
Transition of Care [facility]) - Progression Note    Patient Details  Name: Kimberly Gross MRN: [id number] Date of Birth: 1951/04/27  Transition of Care [facility]) CM/SW Contact  Tom-Johnson, Renea Ee, RN Phone Number: 06/14/2021, 2:23 PM  Clinical Narrative:    CM called and spoke with [place] with [facility] and referral made for Residential Hospice at [facility] per consult. [place] states Bevely Palmer is following and she will have call CM. Awaiting call. CM will continue to follow with needs.      Barriers to Discharge: Continued Medical Work up  Expected Discharge Plan and Services   In-house Referral: Clinical Social Work Discharge Planning Services: CM Consult   Living arrangements for the past 2 months: Single Family Home                                       Social Determinants of Health (SDOH) Interventions    Readmission Risk Interventions No flowsheet data found.

## 2021-06-14 NOTE — Progress Notes (Signed)
Manufacturing engineer [facility]) Hospital Liaison note.     This patient is approved to transfer to [facility] and can transfer tomorrow, 05/16/21, after consents are completed.    [facility] will notify TOC when registration paperwork has been completed to arrange transport.    RN please call report to [phone].   Thank you,     Farrel Gordon, RN, [place] Hospital Liaison  [phone]

## 2021-06-14 NOTE — Progress Notes (Signed)
PROGRESS NOTE   Kimberly Gross  IPJ:[id number] DOB: 09-Mar-1951 DOA: 05/13/2021 PCP: [name], FNP  Brief Narrative:  70 year old lives with family morbidly obese black female BMI 44 HTN, bipolar, DM TY 2 with nephropathy and CKD 4, OSA on CPAP. Patient found obtunded 10/31 2022-CBG was 30 improved with glucagon slightly to 75 but mentation did not GCS was 3 ABG 7.3 patient was intubated  10/31 Obtunded and hypoglycemic for EMS, mentation did not improve with glucagon. CT head unremarkable.  Intubated and amitted to ICU; started on Unasyn for aspiration pneumonia 11/1 LP normal 11/2 Seizures noted on EEG, started Keppra 11/3 LTM EEG reviewed, correlating with metabolic encephalopathy rather than epileptiform activity 11/4-11/15 No improvement in neurological function 11/16 Trach placed 11/19 Transferred to [facility] 11/21 In preparation for PEG placement and transfer to long term care, goals of care discussion with daughter, TRH and Palliative care; Daughter feels that patient would not have wanted to continue in current state, PEG on hold, daughter will discuss with family, may pursue Hospice 11/22 Fever again --> Blood cultures with MRSE; started vancomycin, ID consulted 11/23 Respiratory distress with tachypnea.  clinically patient was worseningmove the patient to ICU for full ventilatory support. 11/27 transfer back to hospitalist service 11/29 goals of care in progress and ongoing 12/1 - current: Palliative meeting with daughter and multiple family members, everyone is agreeable to transition to comfort measures with likely ultimate [facility].    Hospital-Problem based course  Goals of care/comfort measures  -Discontinue feeding tube IV fluids IVs telemetry, focus on comfort measures -Pain control and anxiolytics per palliative care, appreciate insight and recommendations -Will wean patient off ventilator at night, supplemental oxygen during the day along with  increasing pain and anxiolytic medications -Tentatively awaiting Hospice availability for discharge    MRSE PNA/Bacteremia, POA - Vancomycin completed 11/30 AKI superimposed on CKD 2, stable Tachybrady syndrome Persistent Vegetative state Trach 2/2 inability to protect airway Secondary to respiratory failure Type 2 DM, stable  DVT prophylaxis:  Code Status: DNR -transitioning to comfort measures Family Communication: Daughter, sister, nephew and niece at bedside today for long discussion with palliative care  Disposition:  Status is: Inpatient Discharge plan likely to peak in place in the next 24 to 48 hours  Subjective: No acute issues/events overnight   Objective: Vitals:   06/13/21 1900 06/13/21 1943 06/13/21 2000 06/13/21 2130  BP:    (!) 156/82  Pulse: 68 72 77 70  Resp:  (!) 22 (!) 26 20  Temp:    99.3 F (37.4 C)  TempSrc:    Oral  SpO2:  97% 96% 97%  Weight:      Height:        Intake/Output Summary (Last 24 hours) at 06/14/2021 0806 Last data filed at 06/14/2021 0800 Gross per 24 hour  Intake 100 ml  Output 550 ml  Net -450 ml    Filed Weights   06/11/21 0500 06/12/21 0448 06/13/21 0500  Weight: 115.5 kg 115.9 kg 114.5 kg    Examination:  No meaningful movements debilitated GCS 4 Thick neck Mallampati 4 Core track in place Abdomen soft obese nontender no rebound no guarding no lower extremity edema  Data Reviewed: personally reviewed   CBC    Component Value Date/Time   WBC 11.7 (H) 06/09/2021 0821   RBC 2.52 (L) 06/09/2021 0821   HGB 7.1 (L) 06/09/2021 0821   HGB 11.7 09/12/2020 1152   HCT 23.0 (L) 06/09/2021 0821   HCT  36.9 09/12/2020 1152   PLT 378 06/09/2021 0821   PLT 298 09/12/2020 1152   MCV 91.3 06/09/2021 0821   MCV 91 09/12/2020 1152   MCH 28.2 06/09/2021 0821   MCHC 30.9 06/09/2021 0821   RDW 12.7 06/09/2021 0821   RDW 11.8 09/12/2020 1152   LYMPHSABS 2.8 06/09/2021 0821   MONOABS 1.1 (H) 06/09/2021 0821   EOSABS 0.9  (H) 06/09/2021 0821   BASOSABS 0.1 06/09/2021 0821   CMP Latest Ref Rng & Units 06/09/2021 06/08/2021 06/07/2021  Glucose 70 - 99 mg/dL 203(H) 164(H) 166(H)  BUN 8 - 23 mg/dL 112(H) 119(H) 124(H)  Creatinine 0.44 - 1.00 mg/dL 1.46(H) 1.51(H) 1.64(H)  Sodium 135 - 145 mmol/L 136 135 136  Potassium 3.5 - 5.1 mmol/L 4.2 4.2 4.6  Chloride 98 - 111 mmol/L 104 102 103  CO2 22 - 32 mmol/L 21(L) 23 22  Calcium 8.9 - 10.3 mg/dL 8.8(L) 9.0 9.0  Total Protein 6.5 - 8.1 g/dL 5.7(L) - -  Total Bilirubin 0.3 - 1.2 mg/dL 0.5 - -  Alkaline Phos 38 - 126 U/L 215(H) - -  AST 15 - 41 U/L 35 - -  ALT 0 - 44 U/L 36 - -     Radiology Studies: No results found.   Scheduled Meds:  chlorhexidine gluconate (MEDLINE KIT)  15 mL Mouth Rinse BID   Chlorhexidine Gluconate Cloth  6 each Topical Daily   Gerhardt's butt cream   Topical BID   LORazepam  2 mg Intravenous Q6H   mouth rinse  15 mL Mouth Rinse 10 times per day   Continuous Infusions:     LOS: 32 days   Time spent: [place], DO [facility]  06/14/2021, 8:06 AM

## 2021-06-14 NOTE — Progress Notes (Signed)
Patient ID: Kimberly Gross, female   DOB: 04-02-1951, 70 y.o.   MRN: [id number]    Progress Note from the Palliative Medicine Team at [facility]   Patient Name: Kimberly Gross        Date: 06/14/2021 DOB: 22-May-1951  Age: 70 y.o. MRN#: [id number] Attending Physician: Little Ishikawa, MD Primary Care Physician: [name], FNP Admit Date: 05/13/2021   Medical records reviewed /  HPI  70 yo female with past medical history of CKD stage IV, HTN, OSA on CPAP, diabetes, bipolar disorder admitted 05/13/21 with altered mental status and found to be hypoglycemic but no improvement after glucagon or Narcan. Required intubation. Unfortunately no improvement in mental status and concern for hypoglycemic brain injury. Trach placed 05/29/21. 06/03/21 conversation with daughter Kimberly Gross - considering transition to comfort. 06/05/21 return to ICU for ventilator support and no decisions made for comfort. 06/09/21 DNR decided.  Patient continues to require vent support at night.  Long term poor prognosis for meaningful recovery.  Daughter/Kimberly Gross with the support of other family members yesterday day made decision to shift to a path of comfort and dignity, Allowing for natural death.  On exam today patient is unresponsive.  Today's discussion:   I spoke by phone with patient's daughter Kimberly Gross, for continued support and update her on her mother's current medical situation.  Kimberly Gross understands the limited prognosis and as discussed yesterday Kimberly Gross is open and hopeful for residential hospice for end-of-life care for her mother.  Again I detailed the logistics of shifting to a comfort path and interventions we can utilize to enhance her comfort.     Education on residential hospice benefit and eligibility offered.  Education offered on the natural trajectory and expectations at end-of-life.  Questions and concerns addressed.  Emotional support offered.   Plan:    Focus of care is comfort  and dignity - DNR/DNI- previously documented - No further  vent support even at night  - No artifical feeding or hydration now or in the future/ dc tube feeds and free water - No further labs, diagnostics or life prolonging measures - Foley cath for end-of-life care -symptom management        -Morphine 1 mg IV every 6 hrs scheduled for underlying pain         -Morphine 2 mg IV every 2 hours as needed for pain or dyspnea        -Ativan 2 mg IV scheduled every 6 hours, d/c Keppra        -Ativan 1 mg IV every 4 hours as needed         -Robinul 0.4 mg IV 4 times a day as needed -Liberalize visitation - Family request [facility] for EOL care-- prognosis is less than 2 weeks , will place referral - PMT will continue to support holistically   Discussed with Dr Avon Gully   Total time spent on the unit was 35 minutes  Greater than 50% of the time was spent in counseling and coordination of care  Kimberly Lessen NP  Palliative Medicine Team Team Phone # [phone] Pager [phone]

## 2021-06-15 DIAGNOSIS — S062XAA Diffuse traumatic brain injury with loss of consciousness status unknown, initial encounter: Secondary | ICD-10-CM | POA: Diagnosis not present

## 2021-06-15 NOTE — Progress Notes (Signed)
DISCHARGE NOTE [facility] to be discharged [facility] per MD order. Patient verbalized understanding.  Skin clean, dry and intact without evidence of skin break down, no evidence of skin tears noted. IV catheter intact and left in righ forearm per facility request.  Site without signs and symptoms of complications. Dressing and pressure applied. Pt denies pain at the site currently. No complaints noted.  Patient has rectal tube and trach and documented pressure wound to sacral (see assessment).   Discharge packet assembled. An After Visit Summary (AVS) was printed and given to the EMS personnel. Patient escorted via stretcher and discharged to [facility] via ambulance. Report called to accepting facility; all questions and concerns addressed.   Berneta Levins, RN

## 2021-06-15 NOTE — TOC Transition Note (Signed)
Transition of Care [facility]) - CM/SW Discharge Note   Patient Details  Name: Kimberly Gross MRN: [id number] Date of Birth: 24-Jan-1951  Transition of Care [facility]) CM/SW Contact:  Coralee Pesa, [place] Phone Number: 06/15/2021, 11:12 AM   Clinical Narrative:    Pt to be transported to [facility] via [place]. Nurse to call report to [phone].   Final next level of care: [place] Barriers to Discharge: Barriers Resolved   Patient Goals and CMS Choice Patient states their goals for this hospitalization and ongoing recovery are:: To go home CMS Medicare.gov Compare Post Acute Care list provided to:: Patient Represenative (must comment) (Daughter, [name])    Discharge Placement              Patient [facility] bed at:  [facility]) Patient to be transferred to facility by: [facility] Name of family member notified: [name] Patient and family notified of of transfer: 06/15/21  Discharge Plan and Services In-house Referral: Clinical Social Work Discharge Planning Services: CM Consult                                 Social Determinants of Health (SDOH) Interventions     Readmission Risk Interventions No flowsheet data found.

## 2021-06-15 NOTE — Progress Notes (Signed)
Called [employer] spoke to [place].Marland Kitchen

## 2021-06-15 NOTE — Discharge Summary (Signed)
Physician Discharge Summary  TERIKA PILLARD WPV:[id number] DOB: 18-Jun-1951 DOA: 05/13/2021  PCP: [name], FNP  Admit date: 05/13/2021 Discharge date: 06/15/2021  Admitted From: Home Disposition:  [facility]  Discharge Condition:Grim  CODE STATUS:DNR/Comfort measures  Diet recommendation:  As tolerated comfort feeding as discussed  Brief/Interim Summary: 70 year old lives with family morbidly obese black female BMI 44 HTN, bipolar, DM TY 2 with nephropathy and CKD 4, OSA on CPAP. Patient found obtunded 10/31 2022-CBG was 30 improved with glucagon slightly to 75 but mentation did not GCS was 3 ABG 7.3 patient was intubated   10/31 Obtunded and hypoglycemic for EMS, mentation did not improve with glucagon. CT head unremarkable.  Intubated and amitted to ICU; started on Unasyn for aspiration pneumonia 11/1 LP normal 11/2 Seizures noted on EEG, started Keppra 11/3 LTM EEG reviewed, correlating with metabolic encephalopathy rather than epileptiform activity 11/4-11/15 No improvement in neurological function 11/16 Trach placed 11/19 Transferred to [facility] 11/21 In preparation for PEG placement and transfer to long term care, goals of care discussion with daughter, TRH and Palliative care; Daughter feels that patient would not have wanted to continue in current state, PEG on hold, daughter will discuss with family, may pursue Hospice 11/22 Fever again --> Blood cultures with MRSE; started vancomycin, ID consulted 11/23 Respiratory distress with tachypnea.  clinically patient was worseningmove the patient to ICU for full ventilatory support. 11/27 transfer back to hospitalist service 11/29 goals of care in progress and ongoing 12/1 - present: Palliative meeting with daughter and multiple family members, everyone is agreeable to transition to comfort measures with likely ultimate transition to [facility].     Hospital-Problem based course   Goals of care/comfort measures   -Discontinue feeding tube IV fluids IVs telemetry, focus on comfort measures -Pain control and anxiolytics per palliative care, appreciate insight and recommendations -Will wean patient off ventilator at night, supplemental oxygen during the day along with increasing pain and anxiolytic medications -Tentatively awaiting Hospice availability for discharge    MRSE PNA/Bacteremia, POA - Vancomycin completed 11/30 AKI superimposed on CKD 2, stable Tachybrady syndrome Persistent Vegetative state Trach 2/2 inability to protect airway Secondary to respiratory failure Type 2 DM, stable  Discharge Instructions  Discharge Instructions     Discharge patient   Complete by: As directed    Discharge disposition: 51-Hospice/Medical Facility   Discharge patient date: 06/15/2021      Allergies as of 06/15/2021       Reactions   Chlorpromazine Rash        Medication List     STOP taking these medications    Accu-Chek Aviva Plus test strip Generic drug: glucose blood   Accu-Chek Aviva Plus w/Device Kit   Accu-Chek Softclix Lancets lancets   acetaminophen 500 MG tablet Commonly known as: TYLENOL   amLODipine 10 MG tablet Commonly known as: NORVASC   aspirin 81 MG tablet   atorvastatin 40 MG tablet Commonly known as: LIPITOR   B-D SINGLE USE SWABS REGULAR Pads   BIOTIN PO   carvedilol 25 MG tablet Commonly known as: COREG   cholecalciferol 25 MCG (1000 UNIT) tablet Commonly known as: VITAMIN D3   clonazePAM 0.5 MG tablet Commonly known as: KLONOPIN   divalproex 500 MG DR tablet Commonly known as: DEPAKOTE   Droplet Pen Needles 32G X 4 MM Misc Generic drug: Insulin Pen Needle   fenofibrate 160 MG tablet   ferrous sulfate 325 (65 FE) MG tablet   furosemide 40 MG tablet  Commonly known as: LASIX   hydrALAZINE 100 MG tablet Commonly known as: APRESOLINE   MULTIVITAMIN ADULT PO   OMEGA-3 FISH OIL PO   traZODone 50 MG tablet Commonly known as: DESYREL    TURMERIC PO   vitamin B-12 250 MCG tablet Commonly known as: CYANOCOBALAMIN   Xultophy 100-3.6 UNIT-MG/ML Sopn Generic drug: Insulin Degludec-Liraglutide        Allergies  Allergen Reactions   Chlorpromazine Rash    Consultations: PCCM, infectious disease, palliative care, neurology   Procedures/Studies: CT ABDOMEN WO CONTRAST  Result Date: 06/02/2021 CLINICAL DATA:  Dysphagia, assess gastric anatomy for percutaneous fluoroscopic gastrostomy EXAM: CT ABDOMEN WITHOUT CONTRAST TECHNIQUE: Multidetector CT imaging of the abdomen was performed following the standard protocol without IV contrast. COMPARISON:  None. FINDINGS: Lower chest: Bibasilar atelectasis, slightly worse on the left. Heart is enlarged. No pericardial or pleural effusion. Hepatobiliary: Limited without IV contrast. Cholelithiasis noted. Gallbladder nondilated. No large focal hepatic abnormality or biliary obstruction pattern. Common bile duct nondilated. Pancreas: Unremarkable. No pancreatic ductal dilatation or surrounding inflammatory changes. Spleen: Normal in size without focal abnormality. Adrenals/Urinary Tract: normal adrenal glands. Right renal atrophy noted appearing chronic. No renal obstruction or hydronephrosis. No hydroureter. Stomach/Bowel: Feeding tube within the proximal duodenum. Stomach is collapsed. Stomach anatomy is favorable for fluoroscopic percutaneous gastrostomy technique. The stomach is anteriorly position in the abdomen and inferior to the left hepatic lobe. Colon is inferior to the stomach. Negative for bowel obstruction, significant dilatation, ileus, or free air. No free fluid, fluid collection, hemorrhage, hematoma, abscess or ascites. Appendix is normal. Vascular/Lymphatic: Limited without IV contrast. Negative for aneurysm. No retroperitoneal hemorrhage or hematoma. Aortic atherosclerosis noted. No adenopathy. Other: No abdominal wall hernia or abnormality. Musculoskeletal: Degenerative  changes throughout the spine. IMPRESSION: Stomach anatomy is favorable for fluoroscopic percutaneous gastrostomy technique. Mild cardiomegaly and bibasilar atelectasis. Cholelithiasis No other acute intra-abdominal finding by noncontrast CT Aortic Atherosclerosis (ICD10-I70.0). Electronically Signed   By: Jerilynn Mages.  Shick M.D.   On: 06/02/2021 08:21   DG Chest 1 View  Result Date: 06/04/2021 CLINICAL DATA:  Shortness of breath. EXAM: CHEST  1 VIEW COMPARISON:  Radiograph 2 days ago. FINDINGS: Lower lung volumes from prior exam. Anti lordotic positioning. Tracheostomy tube tip remains at the thoracic inlet. Weighted enteric tube in place with tip not included in the field of view. Prominent heart size likely accentuated by low lung volumes. Increasing peribronchial thickening. No convincing pleural effusion. Streaky bibasilar opacities. IMPRESSION: 1. Lower lung volumes from prior exam with increasing peribronchial thickening. This may be bronchitic or congestive. 2. Streaky 80 bibasilar atelectasis. 3. Stable tracheostomy tube. Weighted enteric tube with tip below the field of view. Electronically Signed   By: Keith Rake M.D.   On: 06/04/2021 20:42   MR BRAIN WO CONTRAST  Result Date: 05/19/2021 CLINICAL DATA:  70 year old female with seizure, poor mental status. Initially presented with significant hypoglycemia and altered mental status. EXAM: MRI HEAD WITHOUT CONTRAST TECHNIQUE: Multiplanar, multiecho pulse sequences of the brain and surrounding structures were obtained without intravenous contrast. COMPARISON:  Brain MRI without contrast 05/14/2021 and earlier. FINDINGS: Brain: No restricted diffusion to suggest acute infarction. No midline shift, mass effect, evidence of mass lesion, ventriculomegaly, extra-axial collection or acute intracranial hemorrhage. Cervicomedullary junction and pituitary are within normal limits. No definite hippocampal edema or atrophy. Stable gray-white matter differentiation  throughout the brain. Patchy and confluent bilateral cerebral white matter T2 and FLAIR hyperintensity in a nonspecific configuration. Mild bilateral basal ganglia heterogeneity. Thalami, brainstem,  and cerebellum remain within normal limits. No cortical encephalomalacia or chronic cerebral blood products identified. Vascular: Major intracranial vascular flow voids are stable. Skull and upper cervical spine: Negative visible cervical spine. Visualized bone marrow signal is within normal limits. Sinuses/Orbits: Negative orbits. Increased bilateral paranasal sinus mucosal thickening, mild-to-moderate. Other: Intubated. Fluid in the pharynx. Small left nasopharyngeal retention cysts suspected on series 10, image 3 (inconsequential). Trace mastoid air cell fluid now on the right. IMPRESSION: 1.  No acute intracranial abnormality identified. 2. Stable MRI appearance of the brain since 05/14/2021 with moderately advanced nonspecific signal changes in the cerebral white matter, mildly involving the bilateral basal ganglia. Electronically Signed   By: Genevie Ann M.D.   On: 05/19/2021 11:10   DG CHEST PORT 1 VIEW  Result Date: 06/05/2021 CLINICAL DATA:  Pneumonia. EXAM: PORTABLE CHEST 1 VIEW COMPARISON:  June 04, 2021. FINDINGS: Stable cardiomediastinal silhouette. Tracheostomy and feeding tubes are unchanged in position. Right lung is clear. Stable left basilar atelectasis or infiltrate is noted. Bony thorax is unremarkable. IMPRESSION: Stable left basilar atelectasis or infiltrate. Stable support apparatus. Electronically Signed   By: Marijo Conception M.D.   On: 06/05/2021 13:00   DG Chest Port 1 View  Result Date: 06/02/2021 CLINICAL DATA:  Pain. EXAM: PORTABLE CHEST 1 VIEW COMPARISON:  May 28, 2021 FINDINGS: Stable tracheostomy tube. Feeding tube terminates below today's study. No pneumothorax. The cardiomediastinal silhouette is stable. The lungs are clear. IMPRESSION: Support apparatus as above.  No  acute interval changes. Electronically Signed   By: Dorise Bullion III M.D.   On: 06/02/2021 15:18   DG CHEST PORT 1 VIEW  Result Date: 05/28/2021 CLINICAL DATA:  Tracheostomy EXAM: PORTABLE CHEST 1 VIEW COMPARISON:  Chest x-ray dated May 15, 2021 FINDINGS: Interval removal of ET tube and placement of tracheostomy tube. Interval removal of NG tube. Low lung volumes with hypoventilatory changes. No large pleural effusion or evidence of pneumothorax. Cardiac and mediastinal contours are unchanged. IMPRESSION: Interval placement of tracheostomy tube which appears appropriately positioned. Electronically Signed   By: Yetta Glassman M.D.   On: 05/28/2021 15:25   DG Abd Portable 1V  Result Date: 05/29/2021 CLINICAL DATA:  Feeding tube placement EXAM: PORTABLE ABDOMEN - 1 VIEW COMPARISON:  None. FINDINGS: Limited radiograph of the lower chest and upper abdomen was obtained for the purposes of enteric tube localization. Enteric tube is seen coursing below the diaphragm with distal tip terminating within the expected location of the distal gastric body. IMPRESSION: Enteric tube within the distal gastric body. Electronically Signed   By: Davina Poke D.O.   On: 05/29/2021 08:47   Overnight EEG with video  Result Date: 05/16/2021 Lora Havens, MD     05/17/2021  9:02 AM Patient Name: JENY NIELD MRN: [id number] Epilepsy Attending: Lora Havens Referring Physician/Provider: Dr Amie [place] Duration: 05/15/2021 2044 to 05/16/2021 6468 Patient history: 70 y.o. female with a history of encephalopathy who is undergoing an EEG to evaluate for seizures. Level of alertness:  lethargic AEDs during EEG study: LEV Technical aspects: This EEG study was done with scalp electrodes positioned according to the 10-20 International system of electrode placement. Electrical activity was acquired at a sampling rate of $Remov'500Hz'OkQAIZ$  and reviewed with a high frequency filter of $RemoveB'70Hz'QHSilMFD$  and a low frequency filter of $RemoveB'1Hz'WyRGqvby$ . EEG  data were recorded continuously and digitally stored. Description: EEG initially showed continuous generalized 3 to 6 Hz theta-delta slowing. Intermittent generalized periodic discharges (GPDs) with triphasic morphology at  1Hz  were also noted which at times appear rhythmic.Gradually the GPDs with triphasic morphology improved and eeg predominantly showed 5-7hz  theta slowing as well as intermittent 2-3hz  delta slowing. Hyperventilation and photic stimulation were not performed.   ABNORMALITY - Periodic discharges with triphasic morphology, generalized ( GPDs) - Continuous slow, generalized IMPRESSION: This study showed generalized periodic discharges (GPDs) with triphasic morphology at 1Hz  which at times appear rhythmic . This is on the ictal-interictal continuum with moderate potential for seizures. There is also moderate diffuse encephalopathy, nonspecific etiology but could be secondary to toxic-metabolic causes. No definite seizures were seen throughout the recording. [place]   DG HIP PORT UNILAT WITH PELVIS 1V LEFT  Result Date: 05/17/2021 CLINICAL DATA:  Pain EXAM: DG HIP (WITH OR WITHOUT PELVIS) 1V PORT LEFT COMPARISON:  None FINDINGS: No displaced fractures or dislocation is seen. In image 3, there is questionable minimal cortical irregularity in the lateral aspect of subcapital portion of left femur. This may be an artifact or undisplaced fracture. This finding could not be seen in the rest of the images. There are numerous vascular calcifications in the pelvis. Degenerative changes are noted in the lower lumbar spine IMPRESSION: No displaced fracture or dislocation is seen. There is questionable minimal cortical irregularity in the lateral aspect of the subcapital portion of neck of left femur which may be an artifact or undisplaced fracture. If clinically warranted, follow-up CT or MRI may be considered. Electronically Signed   By: Elmer Picker M.D.   On: 05/17/2021 09:13   VAS [place]  UPPER EXTREMITY VENOUS DUPLEX  Result Date: 06/08/2021 UPPER VENOUS STUDY  Patient Name:  KIASIA CHOU  Date of Exam:   06/07/2021 Medical Rec #: [id number]        Accession #:    [id number] Date of Birth: 04/23/1951        Patient Gender: F Patient Age:   53 years Exam Location:  [facility] Procedure:      VAS [place] UPPER EXTREMITY VENOUS DUPLEX Referring Phys: CORNELIUS VAN DAM --------------------------------------------------------------------------------  Indications: Recent IV right forearm Limitations: Body habitus, poor ultrasound/tissue interface and patient unable to reposition. Performing Technologist: Darlin Coco RDMS, RVT  Examination Guidelines: A complete evaluation includes B-mode imaging, spectral Doppler, color Doppler, and power Doppler as needed of all accessible portions of each vessel. Bilateral testing is considered an integral part of a complete examination. Limited examinations for reoccurring indications may be performed as noted.  Right Findings: +----------+------------+---------+-----------+----------+---------------------+ RIGHT     CompressiblePhasicitySpontaneousProperties       Summary        +----------+------------+---------+-----------+----------+---------------------+ IJV           Full       Yes       Yes                                    +----------+------------+---------+-----------+----------+---------------------+ Subclavian    Full       Yes       Yes                                    +----------+------------+---------+-----------+----------+---------------------+ Axillary      Full       Yes       Yes                                    +----------+------------+---------+-----------+----------+---------------------+  Brachial      Full                                                        +----------+------------+---------+-----------+----------+---------------------+ Radial        Full                                    Not well visualized                                                       due to positioning   +----------+------------+---------+-----------+----------+---------------------+ Ulnar         Full                                   Not well visualized                                                       due to positioning   +----------+------------+---------+-----------+----------+---------------------+ Cephalic      None       No        No                 Age Indeterminate   +----------+------------+---------+-----------+----------+---------------------+ Basilic                                                Not visualized     +----------+------------+---------+-----------+----------+---------------------+  Left Findings: +----------+------------+---------+-----------+----------+-------+ LEFT      CompressiblePhasicitySpontaneousPropertiesSummary +----------+------------+---------+-----------+----------+-------+ Subclavian               Yes       Yes                      +----------+------------+---------+-----------+----------+-------+  Summary:  Right: No evidence of deep vein thrombosis in the upper extremity. Findings consistent with age indeterminate superficial vein thrombosis involving the right cephalic vein. However, unable to visualize the basilic vein.  Left: No evidence of thrombosis in the subclavian.  *See table(s) above for measurements and observations.  Diagnosing physician: Orlie Pollen Electronically signed by Orlie Pollen on 06/08/2021 at 10:39:52 AM.    Final      Subjective: No acute issue/events overnight   Discharge Exam: Vitals:   06/14/21 2301 06/15/21 0301  BP:    Pulse: 78 81  Resp: (!) 28 (!) 28  Temp:    SpO2: 98% 98%   Vitals:   06/14/21 2058 06/14/21 2150 06/14/21 2301 06/15/21 0301  BP:  135/63    Pulse: 88 76 78 81  Resp: (!) 28 (!) 26 (!) 28 (!) 28  Temp:  100.3 F (37.9 C)    TempSrc:  Axillary    SpO2: 98%  99%  98% 98%  Weight:      Height:        General: Nonresponsive to tactile or verbal stimuli Cardiovascular: RRR, S1/S2 +, no rubs, no gallops Respiratory: CTA bilaterally, no wheezing, no rhonchi Abdominal: Soft, NT, ND, bowel sounds + Extremities: no edema, no cyanosis    The results of significant diagnostics from this hospitalization (including imaging, microbiology, ancillary and laboratory) are listed below for reference.     Microbiology: Recent Results (from the past 240 hour(s))  Culture, blood (routine x 2)     Status: None   Collection Time: 06/07/21 10:17 AM   Specimen: BLOOD RIGHT HAND  Result Value Ref Range Status   Specimen Description BLOOD RIGHT HAND  Final   Special Requests   Final    BOTTLES DRAWN AEROBIC AND ANAEROBIC Blood Culture adequate volume   Culture   Final    NO GROWTH 5 DAYS Performed at [facility], [address], [place], [place] [address]    Report Status 06/12/2021 FINAL  Final  Culture, blood (routine x 2)     Status: None   Collection Time: 06/07/21 10:33 AM   Specimen: BLOOD LEFT HAND  Result Value Ref Range Status   Specimen Description BLOOD LEFT HAND  Final   Special Requests   Final    BOTTLES DRAWN AEROBIC AND ANAEROBIC Blood Culture adequate volume   Culture   Final    NO GROWTH 5 DAYS Performed at [facility], [place] [address] [place],  [address]    Report Status 06/12/2021 FINAL  Final     Labs: BNP (last 3 results) Recent Labs    06/04/21 2233  BNP 656.8*   Basic Metabolic Panel: Recent Labs  Lab 06/09/21 0821  NA 136  K 4.2  CL 104  CO2 21*  GLUCOSE 203*  BUN 112*  CREATININE 1.46*  CALCIUM 8.8*   Liver Function Tests: Recent Labs  Lab 06/09/21 0821  AST 35  ALT 36  ALKPHOS 215*  BILITOT 0.5  PROT 5.7*  ALBUMIN 2.1*   No results for input(s): LIPASE, AMYLASE in the last 168 hours. No results for input(s): AMMONIA in the last 168 hours. CBC: Recent Labs  Lab  06/09/21 0821  WBC 11.7*  NEUTROABS 6.6  HGB 7.1*  HCT 23.0*  MCV 91.3  PLT 378   Cardiac Enzymes: No results for input(s): CKTOTAL, CKMB, CKMBINDEX, TROPONINI in the last 168 hours. BNP: Invalid input(s): POCBNP CBG: Recent Labs  Lab 06/12/21 1933 06/12/21 2326 06/13/21 0328 06/13/21 0735 06/13/21 1110  GLUCAP 139* 124* 119* 141* 144*   D-Dimer No results for input(s): DDIMER in the last 72 hours. Hgb A1c No results for input(s): HGBA1C in the last 72 hours. Lipid Profile No results for input(s): CHOL, HDL, LDLCALC, TRIG, CHOLHDL, LDLDIRECT in the last 72 hours. Thyroid function studies No results for input(s): TSH, T4TOTAL, T3FREE, THYROIDAB in the last 72 hours.  Invalid input(s): FREET3 Anemia work up No results for input(s): VITAMINB12, FOLATE, FERRITIN, TIBC, IRON, RETICCTPCT in the last 72 hours. Urinalysis    Component Value Date/Time   COLORURINE YELLOW 06/03/2021 0726   APPEARANCEUR CLEAR 06/03/2021 0726   LABSPEC 1.014 06/03/2021 0726   PHURINE 5.0 06/03/2021 0726   GLUCOSEU NEGATIVE 06/03/2021 0726   HGBUR NEGATIVE 06/03/2021 0726   BILIRUBINUR NEGATIVE 06/03/2021 0726   BILIRUBINUR Negative 09/12/2020 1648   KETONESUR NEGATIVE 06/03/2021 0726   PROTEINUR NEGATIVE 06/03/2021 0726   UROBILINOGEN 0.2 09/12/2020  1648   UROBILINOGEN 0.2 08/23/2010 1355   NITRITE NEGATIVE 06/03/2021 0726   LEUKOCYTESUR NEGATIVE 06/03/2021 0726   Sepsis Labs Invalid input(s): PROCALCITONIN,  WBC,  LACTICIDVEN Microbiology Recent Results (from the past 240 hour(s))  Culture, blood (routine x 2)     Status: None   Collection Time: 06/07/21 10:17 AM   Specimen: BLOOD RIGHT HAND  Result Value Ref Range Status   Specimen Description BLOOD RIGHT HAND  Final   Special Requests   Final    BOTTLES DRAWN AEROBIC AND ANAEROBIC Blood Culture adequate volume   Culture   Final    NO GROWTH 5 DAYS Performed at [facility], [place] [address] [place], [place] [address]     Report Status 06/12/2021 FINAL  Final  Culture, blood (routine x 2)     Status: None   Collection Time: 06/07/21 10:33 AM   Specimen: BLOOD LEFT HAND  Result Value Ref Range Status   Specimen Description BLOOD LEFT HAND  Final   Special Requests   Final    BOTTLES DRAWN AEROBIC AND ANAEROBIC Blood Culture adequate volume   Culture   Final    NO GROWTH 5 DAYS Performed at [facility], [place] [address] [place], [place] [address]    Report Status 06/12/2021 FINAL  Final     Time coordinating discharge: Over 30 minutes  SIGNED:   Little Ishikawa, DO [facility] 06/15/2021, 8:06 AM Pager   If 7PM-7AM, please contact night-coverage [email or web address]

## 2021-06-15 NOTE — Plan of Care (Signed)
  Problem: Coping: Goal: Level of anxiety will decrease Outcome: Progressing   Problem: Pain Managment: Goal: General experience of comfort will improve Outcome: Progressing   Problem: Safety: Goal: Ability to remain free from injury will improve Outcome: Progressing   

## 2021-06-15 NOTE — Progress Notes (Signed)
[facility] ([facility] Hospital Liaison note.     This patient is approved to transfer to [facility] today.    [facility] will notify TOC when registration paperwork has been completed to arrange transport.    RN please call report to [phone].   Thank you,     Mary Anne Robertson, RN, CCM       [facility] Hospital Liaison  [phone] 

## 2021-06-20 ENCOUNTER — Ambulatory Visit: Payer: Self-pay

## 2021-06-20 ENCOUNTER — Telehealth: Payer: Medicare (Managed Care)

## 2021-06-20 ENCOUNTER — Telehealth: Payer: Self-pay | Admitting: Nurse Practitioner

## 2021-06-20 DIAGNOSIS — I10 Essential (primary) hypertension: Secondary | ICD-10-CM

## 2021-06-20 DIAGNOSIS — N184 Chronic kidney disease, stage 4 (severe): Secondary | ICD-10-CM

## 2021-06-20 DIAGNOSIS — E1122 Type 2 diabetes mellitus with diabetic chronic kidney disease: Secondary | ICD-10-CM

## 2021-06-20 DIAGNOSIS — E782 Mixed hyperlipidemia: Secondary | ICD-10-CM

## 2021-06-20 DIAGNOSIS — R29898 Other symptoms and signs involving the musculoskeletal system: Secondary | ICD-10-CM

## 2021-06-20 NOTE — Chronic Care Management (AMB) (Signed)
Chronic Care Management   CCM RN Visit Note  06/20/2021 Name: Kimberly Gross MRN: [id number] DOB: Oct 09, 1950  Subjective: Kimberly Gross is a 70 y.o. year old female who is a primary care patient of [name], [place]. The care management team was consulted for assistance with disease management and care coordination needs.    Collaboration with [facility]  for  confirmation of patient admission  in response to provider referral for case management and/or care coordination services.   Consent to Services:  The patient was given information about Chronic Care Management services, agreed to services, and gave verbal consent prior to initiation of services.  Please see initial visit note for detailed documentation.   Patient agreed to services and verbal consent obtained.   Assessment: Review of patient past medical history, allergies, medications, health status, including review of consultants reports, laboratory and other test data, was performed as part of comprehensive evaluation and provision of chronic care management services.   SDOH (Social Determinants of Health) assessments and interventions performed:    CCM Care Plan  Allergies  Allergen Reactions   Chlorpromazine Rash    No outpatient encounter medications on file as of 06/20/2021.   No facility-administered encounter medications on file as of 06/20/2021.    Patient Active Problem List   Diagnosis Date Noted   Palliative care by specialist    Acute respiratory failure with hypoxia ([place])    Staphylococcus epidermidis bacteremia 06/02/2021   Diffuse injury to cerebrum 06/01/2021   Persistent vegetative state ([place]) 06/01/2021   Anemia due to stage 4 chronic kidney disease ([place]) 06/01/2021   Pressure injury of skin 05/31/2021   Acute hypercapnic respiratory failure (HCC)    CKD (chronic kidney disease) stage 4, GFR 15-29 ml/min (HCC) 09/12/2020   Cough 09/10/2018   Type 2 diabetes mellitus without  complication, without long-term current use of insulin ([place]) 05/14/2018   Essential hypertension 05/14/2018   Upper respiratory infection, viral 05/14/2018   Hyperlipidemia 05/05/2018   Morbid (severe) obesity due to excess calories ([place]) 01/22/2017   OSA (obstructive sleep apnea) 01/22/2017   Tremor 05/08/2015   Anxiety state 05/08/2015   Medication-induced postural tremor 05/08/2015   Bipolar 1 disorder, mixed, moderate ([place]) 05/08/2015    Conditions to be addressed/monitored: Type 2 diabetes mellitus, Essential hypertension, Mixed Hyperlipidemia, Decreased strength of lower extremity, stage 4 chronic kidney disease   Care Plan : Chronic Kidney (Adult)  Updates made by Lynne Logan, RN since 06/20/2021 12:00 AM  Completed 06/20/2021   Problem: Disease Progression Resolved 06/20/2021  Priority: High  Note:   06/20/21 Patient closed from CCM due to being transferred to Hospice. PCP and CCM team made aware.     Long-Range Goal: Disease Progression Prevented or Minimized Completed 06/20/2021  Start Date: 10/03/2020  Expected End Date: 10/03/2021  Recent Progress: On track  Priority: High  Note:   06/20/21 Patient closed from CCM due to being transferred to Hospice. PCP and CCM team made aware.   Current Barriers:  Ineffective Self Health Maintenance Clinical Goal(s):  Collaboration with [name], FNP regarding development and update of comprehensive plan of care as evidenced by provider attestation and co-signature Inter-disciplinary care team collaboration (see longitudinal plan of care) patient will work with care management team to address care coordination and chronic disease management needs related to Disease Management Educational Needs Care Coordination Medication Management and Education Psychosocial Support   Interventions:  03/04/21 completed successful outbound call with patient  Evaluation  of current treatment plan related to   stage 4 chronic kidney disease  ,  self-management and patient's adherence to plan as established by provider. Collaboration with [name], FNP regarding development and update of comprehensive plan of care as evidenced by provider attestation       and co-signature Inter-disciplinary care team collaboration (see longitudinal plan of care) Provided education to patient about basic disease process for CKD Review of patient status, including review of consultants reports, relevant laboratory and other test results, and medications completed. Educated patient on dietary recommendations including salt substitutes and importance of avoiding Ms. Dash due to elevated potassium levels Reviewed medications with patient and discussed importance of medication adherence Discussed plans with patient for ongoing care management follow up and provided patient with direct contact information for care management team Self Care Activities:  Continue to self administers medications as prescribed Continue to adhere to a low Sodium heart healthy diet  Attend all scheduled provider appointments Call pharmacy for medication refills Call provider office for new concerns or questions Patient Goals: - drink 6 to 8 glasses of water each day - follow MD recommendations for CKD   Follow Up Plan: Telephone follow up appointment with care management team member scheduled for: 05/30/21    Care Plan : Hypertension (Adult)  Updates made by Lynne Logan, RN since 06/20/2021 12:00 AM  Completed 06/20/2021   Problem: Hypertension (Hypertension) Resolved 06/20/2021  Priority: High  Note:   06/20/21 Patient closed from CCM due to being transferred to Hospice. PCP and CCM team made aware.     Long-Range Goal: Hypertension Monitored Completed 06/20/2021  Start Date: 10/03/2020  Expected End Date: 10/03/2021  Recent Progress: On track  Priority: High  Note:   06/20/21 Patient closed from CCM due to being transferred to Hospice. PCP and CCM team made  aware.   Objective:  Last practice recorded BP readings:  BP Readings from Last 3 Encounters:  01/24/21 116/64  12/25/20 (!) 156/82  09/24/20 (!) 142/80   Most recent eGFR/CrCl:  Lab Results  Component Value Date   EGFR 26 (L) 09/27/2020    No components found for: CRCL Current Barriers:  Knowledge Deficits related to basic understanding of hypertension pathophysiology and self care management Knowledge Deficits related to understanding of medications prescribed for management of hypertension Case Manager Clinical Goal(s):  patient will demonstrate improved adherence to prescribed treatment plan for hypertension as evidenced by taking all medications as prescribed, monitoring and recording blood pressure as directed, adhering to low sodium/DASH diet Interventions:  03/04/21 completed successful outbound call with patient  Collaboration with [name], FNP regarding development and update of comprehensive plan of care as evidenced by provider attestation and co-signature Inter-disciplinary care team collaboration (see longitudinal plan of care) Evaluation of current treatment plan related to Hypertension and patient's adherence to plan as established by provider Review of patient status, including review of consultant's reports, relevant laboratory and other test results, and medications completed. Reviewed medications with patient and discussed importance of medication adherence Provided education to patient re: target BP <130/80; Educated on dietary and exercise recommendations Determined patient continues to be followed by the [facility]; Determined she is being followed by Velora Heckler RPH-CPP to assist with medication monitoring and dosage adjustments as needed to improve Hypertension  Reviewed recent [facility] follow up completed on 02/27/21 with the following Assessment/Plan noted:  Blood pressure 116/64, pulse 72, resp. rate 16,  height $Remov'5\' 3"'rCrILL$  (1.6  m), weight 256 lb (116.1 kg), SpO2 93 %. Essential hypertension Patient with essential hypertension, finally has reached blood pressure goal.  Praised patient on adjusting her died to limit processed foods and focusing on medication compliance.  Reviewed dietary changes and encouraged that she continue with these and slowly work to further decrease processed foods.  Praised her adding more fruits and vegetables throughout the week.  Advised that she continue with current regimen.  She is scheduled to see Dr. Margaretann Loveless in 2 months, we will follow up with her in 4 months to help keep her on track.   Instructed patient to continue to monitor her BP at home 1-2 times daily and record readings; Instructed to notify the Pharm D and or Cardiology promptly of abnormally high or low readings  Reviewed and discussed ongoing follow up with [facility] q 3 MONTHS FOR HYPERTENSION.  Discussed plans with patient for ongoing care management follow up and provided patient with direct contact information for care management team Patient Self Care Activities:  Continue to self administers medications as prescribed Continue to monitor her BP at home 1-2 times daily and record readings Notify the Pharm D and or Cardiology promptly of abnormally high or low readings  Continue to adhere to a low Sodium heart healthy diet  Attend all scheduled provider appointments Call pharmacy for medication refills Call provider office for new concerns or questions Patient Goals: - check blood pressure 3 times per week - write blood pressure results in a log or diary  Follow Up Plan: Telephone follow up appointment with care management team member scheduled for: 05/30/21    Care Plan : Vitamin D deficiency  Updates made by Lynne Logan, RN since 06/20/2021 12:00 AM  Completed 06/20/2021   Problem: Vitamin D deficiency Resolved 06/20/2021  Priority: Medium  Note:   06/20/21 Patient closed from CCM due to being  transferred to Hospice. PCP and CCM team made aware.     Long-Range Goal: Vitamin D deficiency - treatment optimized Completed 06/20/2021  Start Date: 10/03/2020  Expected End Date: 10/03/2021  Recent Progress: On track  Priority: Medium  Note:   06/20/21 Patient closed from CCM due to being transferred to Hospice. PCP and CCM team made aware.   Current Barriers:  Ineffective Self Health Maintenance Clinical Goal(s):  Collaboration with [name], FNP regarding development and update of comprehensive plan of care as evidenced by provider attestation and co-signature Inter-disciplinary care team collaboration (see longitudinal plan of care) patient will work with care management team to address care coordination and chronic disease management needs related to Disease Management Educational Needs Care Coordination Medication Management and Education Psychosocial Support   Interventions:  03/04/21 completed successful outbound call with patient  Evaluation of current treatment plan related to  Vitamin D deficiency , self-management and patient's adherence to plan as established by provider. Collaboration with [name], FNP regarding development and update of comprehensive plan of care as evidenced by provider attestation       and co-signature Inter-disciplinary care team collaboration (see longitudinal plan of care) Provided education to patient about basic disease process related to Vitamin D deficiency  Review of patient status, including review of consultants reports, relevant laboratory and other test results, and medications completed. Educated patient about dietary recommendations and the importance of getting natural sunlight at least 15 minutes daily when possible Reviewed medications with patient and discussed importance of medication adherence Discussed plans with patient for ongoing care management follow up and provided  patient with direct contact information for care  management team Self Care Activities:  Continue to self administers medications as prescribed Attend all scheduled provider appointments Call pharmacy for medication refills Call provider office for new concerns or questions Eat foods rich in Vitamin D as directed  Get at least 15 minutes of natural sunlight daily when possible   Patient Goals: - Add Vitamin D rich foods to diet - Get at least 15 minutes of natural sunlight when possible - Take Vitamin D supplement exactly as prescribed   Follow Up Plan: Telephone follow up appointment with care management team member scheduled for: 05/30/21     Plan:No further follow up required:    Barb Merino, RN, BSN, CCM Care Management Coordinator [facility]  Direct Phone: [phone]

## 2021-06-20 NOTE — Telephone Encounter (Signed)
Called daughter no answer, left vm to return call to office and will try to call back tomorrow morning

## 2021-06-21 ENCOUNTER — Telehealth: Payer: Self-pay | Admitting: Nurse Practitioner

## 2021-06-21 NOTE — Telephone Encounter (Signed)
Called daughter to get an update, verbalizes she is in transition. Offered if there is anything we could do to help to let us know.

## 2021-07-10 ENCOUNTER — Other Ambulatory Visit: Payer: Self-pay | Admitting: Nurse Practitioner

## 2021-07-14 DEATH — deceased

## 2021-08-15 ENCOUNTER — Ambulatory Visit: Payer: Medicare (Managed Care) | Admitting: Adult Health

## 2021-09-12 ENCOUNTER — Encounter: Payer: Medicare (Managed Care) | Admitting: Nurse Practitioner
